# Patient Record
Sex: Female | Born: 1935
Health system: Southern US, Community
[De-identification: ages and names within clinical notes are randomized; demographics above are authoritative.]

## PROBLEM LIST (undated history)

## (undated) DIAGNOSIS — M1990 Unspecified osteoarthritis, unspecified site: Secondary | ICD-10-CM

## (undated) DIAGNOSIS — F32A Depression, unspecified: Secondary | ICD-10-CM

## (undated) DIAGNOSIS — T8859XA Other complications of anesthesia, initial encounter: Secondary | ICD-10-CM

## (undated) DIAGNOSIS — C3490 Malignant neoplasm of unspecified part of unspecified bronchus or lung: Secondary | ICD-10-CM

## (undated) DIAGNOSIS — F329 Major depressive disorder, single episode, unspecified: Secondary | ICD-10-CM

## (undated) DIAGNOSIS — T4145XA Adverse effect of unspecified anesthetic, initial encounter: Secondary | ICD-10-CM

## (undated) DIAGNOSIS — Z9109 Other allergy status, other than to drugs and biological substances: Secondary | ICD-10-CM

## (undated) DIAGNOSIS — R51 Headache: Secondary | ICD-10-CM

## (undated) DIAGNOSIS — C801 Malignant (primary) neoplasm, unspecified: Secondary | ICD-10-CM

## (undated) DIAGNOSIS — T783XXA Angioneurotic edema, initial encounter: Secondary | ICD-10-CM

## (undated) DIAGNOSIS — J309 Allergic rhinitis, unspecified: Secondary | ICD-10-CM

## (undated) DIAGNOSIS — J9819 Other pulmonary collapse: Secondary | ICD-10-CM

## (undated) DIAGNOSIS — J449 Chronic obstructive pulmonary disease, unspecified: Secondary | ICD-10-CM

## (undated) DIAGNOSIS — J4489 Other specified chronic obstructive pulmonary disease: Secondary | ICD-10-CM

## (undated) DIAGNOSIS — N390 Urinary tract infection, site not specified: Secondary | ICD-10-CM

## (undated) HISTORY — DX: Chronic obstructive pulmonary disease, unspecified: J44.9

## (undated) HISTORY — PX: ABDOMINAL HYSTERECTOMY: SHX81

## (undated) HISTORY — DX: Malignant neoplasm of unspecified part of unspecified bronchus or lung: C34.90

## (undated) HISTORY — DX: Angioneurotic edema, initial encounter: T78.3XXA

## (undated) HISTORY — DX: Other specified chronic obstructive pulmonary disease: J44.89

## (undated) HISTORY — DX: Allergic rhinitis, unspecified: J30.9

---

## 1957-11-04 DIAGNOSIS — J9819 Other pulmonary collapse: Secondary | ICD-10-CM

## 1957-11-04 HISTORY — DX: Other pulmonary collapse: J98.19

## 1967-11-05 HISTORY — PX: CHOLECYSTECTOMY: SHX55

## 1989-07-05 HISTORY — PX: CLOSED MANIPULATION SHOULDER: SUR205

## 1998-09-25 ENCOUNTER — Ambulatory Visit (HOSPITAL_COMMUNITY): Admission: RE | Admit: 1998-09-25 | Discharge: 1998-09-25 | Payer: Self-pay | Admitting: *Deleted

## 1999-08-28 ENCOUNTER — Encounter: Admission: RE | Admit: 1999-08-28 | Discharge: 1999-08-28 | Payer: Self-pay

## 1999-08-28 ENCOUNTER — Encounter: Payer: Self-pay | Admitting: *Deleted

## 2000-08-28 ENCOUNTER — Encounter: Admission: RE | Admit: 2000-08-28 | Discharge: 2000-08-28 | Payer: Self-pay | Admitting: Internal Medicine

## 2000-08-28 ENCOUNTER — Encounter: Payer: Self-pay | Admitting: Internal Medicine

## 2000-08-28 ENCOUNTER — Other Ambulatory Visit: Admission: RE | Admit: 2000-08-28 | Discharge: 2000-08-28 | Payer: Self-pay | Admitting: Internal Medicine

## 2000-09-04 ENCOUNTER — Encounter: Admission: RE | Admit: 2000-09-04 | Discharge: 2000-09-04 | Payer: Self-pay | Admitting: Internal Medicine

## 2000-09-04 ENCOUNTER — Encounter: Payer: Self-pay | Admitting: Internal Medicine

## 2000-09-08 ENCOUNTER — Encounter: Admission: RE | Admit: 2000-09-08 | Discharge: 2000-12-07 | Payer: Self-pay | Admitting: Internal Medicine

## 2001-01-14 ENCOUNTER — Encounter: Admission: RE | Admit: 2001-01-14 | Discharge: 2001-04-14 | Payer: Self-pay | Admitting: Internal Medicine

## 2001-06-16 ENCOUNTER — Encounter: Admission: RE | Admit: 2001-06-16 | Discharge: 2001-09-14 | Payer: Self-pay | Admitting: Internal Medicine

## 2001-08-31 ENCOUNTER — Encounter: Admission: RE | Admit: 2001-08-31 | Discharge: 2001-08-31 | Payer: Self-pay | Admitting: Internal Medicine

## 2001-08-31 ENCOUNTER — Encounter: Payer: Self-pay | Admitting: Internal Medicine

## 2002-04-20 ENCOUNTER — Encounter: Admission: RE | Admit: 2002-04-20 | Discharge: 2002-07-19 | Payer: Self-pay | Admitting: Internal Medicine

## 2002-09-08 ENCOUNTER — Encounter: Admission: RE | Admit: 2002-09-08 | Discharge: 2002-09-08 | Payer: Self-pay | Admitting: Internal Medicine

## 2002-09-08 ENCOUNTER — Encounter: Payer: Self-pay | Admitting: Internal Medicine

## 2003-10-13 ENCOUNTER — Encounter (INDEPENDENT_AMBULATORY_CARE_PROVIDER_SITE_OTHER): Payer: Self-pay | Admitting: *Deleted

## 2003-10-13 ENCOUNTER — Ambulatory Visit (HOSPITAL_COMMUNITY): Admission: RE | Admit: 2003-10-13 | Discharge: 2003-10-13 | Payer: Self-pay | Admitting: Gastroenterology

## 2003-10-18 ENCOUNTER — Encounter: Admission: RE | Admit: 2003-10-18 | Discharge: 2003-10-18 | Payer: Self-pay | Admitting: Internal Medicine

## 2004-09-17 ENCOUNTER — Other Ambulatory Visit: Admission: RE | Admit: 2004-09-17 | Discharge: 2004-09-17 | Payer: Self-pay | Admitting: Internal Medicine

## 2004-11-04 DIAGNOSIS — C801 Malignant (primary) neoplasm, unspecified: Secondary | ICD-10-CM

## 2004-11-04 HISTORY — DX: Malignant (primary) neoplasm, unspecified: C80.1

## 2004-11-04 HISTORY — PX: OTHER SURGICAL HISTORY: SHX169

## 2004-11-13 ENCOUNTER — Ambulatory Visit: Payer: Self-pay | Admitting: Internal Medicine

## 2004-11-15 ENCOUNTER — Encounter: Admission: RE | Admit: 2004-11-15 | Discharge: 2004-11-15 | Payer: Self-pay | Admitting: Internal Medicine

## 2004-11-21 ENCOUNTER — Ambulatory Visit: Payer: Self-pay | Admitting: Internal Medicine

## 2004-11-28 ENCOUNTER — Ambulatory Visit: Payer: Self-pay | Admitting: Internal Medicine

## 2004-12-03 ENCOUNTER — Ambulatory Visit: Payer: Self-pay | Admitting: Internal Medicine

## 2004-12-11 ENCOUNTER — Ambulatory Visit: Payer: Self-pay | Admitting: Internal Medicine

## 2004-12-18 ENCOUNTER — Ambulatory Visit: Payer: Self-pay | Admitting: Internal Medicine

## 2004-12-24 ENCOUNTER — Ambulatory Visit: Payer: Self-pay | Admitting: Internal Medicine

## 2004-12-31 ENCOUNTER — Ambulatory Visit: Payer: Self-pay | Admitting: Internal Medicine

## 2005-01-08 ENCOUNTER — Ambulatory Visit: Payer: Self-pay | Admitting: Internal Medicine

## 2005-01-15 ENCOUNTER — Ambulatory Visit: Payer: Self-pay | Admitting: Internal Medicine

## 2005-01-21 ENCOUNTER — Ambulatory Visit: Payer: Self-pay | Admitting: Internal Medicine

## 2005-01-28 ENCOUNTER — Ambulatory Visit: Payer: Self-pay | Admitting: Internal Medicine

## 2005-02-04 ENCOUNTER — Ambulatory Visit: Payer: Self-pay | Admitting: Internal Medicine

## 2005-02-11 ENCOUNTER — Ambulatory Visit: Payer: Self-pay | Admitting: Internal Medicine

## 2005-02-19 ENCOUNTER — Ambulatory Visit: Payer: Self-pay | Admitting: Internal Medicine

## 2005-02-25 ENCOUNTER — Ambulatory Visit: Payer: Self-pay | Admitting: Internal Medicine

## 2005-03-04 ENCOUNTER — Ambulatory Visit: Payer: Self-pay | Admitting: Internal Medicine

## 2005-03-11 ENCOUNTER — Ambulatory Visit: Payer: Self-pay | Admitting: Internal Medicine

## 2005-03-19 ENCOUNTER — Ambulatory Visit: Payer: Self-pay | Admitting: Internal Medicine

## 2005-03-28 ENCOUNTER — Ambulatory Visit: Payer: Self-pay | Admitting: Internal Medicine

## 2005-04-02 ENCOUNTER — Ambulatory Visit: Payer: Self-pay | Admitting: *Deleted

## 2005-04-05 ENCOUNTER — Ambulatory Visit: Payer: Self-pay | Admitting: Internal Medicine

## 2005-04-09 ENCOUNTER — Ambulatory Visit (HOSPITAL_COMMUNITY): Admission: RE | Admit: 2005-04-09 | Discharge: 2005-04-09 | Payer: Self-pay | Admitting: Internal Medicine

## 2005-04-09 ENCOUNTER — Encounter (INDEPENDENT_AMBULATORY_CARE_PROVIDER_SITE_OTHER): Payer: Self-pay | Admitting: Specialist

## 2005-04-11 ENCOUNTER — Ambulatory Visit: Payer: Self-pay | Admitting: Internal Medicine

## 2005-04-17 ENCOUNTER — Ambulatory Visit (HOSPITAL_COMMUNITY): Admission: RE | Admit: 2005-04-17 | Discharge: 2005-04-17 | Payer: Self-pay | Admitting: Internal Medicine

## 2005-04-18 ENCOUNTER — Ambulatory Visit (HOSPITAL_COMMUNITY): Admission: RE | Admit: 2005-04-18 | Discharge: 2005-04-18 | Payer: Self-pay | Admitting: Internal Medicine

## 2005-04-22 ENCOUNTER — Ambulatory Visit: Payer: Self-pay | Admitting: Internal Medicine

## 2005-04-24 ENCOUNTER — Encounter: Admission: RE | Admit: 2005-04-24 | Discharge: 2005-04-24 | Payer: Self-pay | Admitting: Thoracic Surgery

## 2005-04-29 ENCOUNTER — Ambulatory Visit: Payer: Self-pay | Admitting: Internal Medicine

## 2005-05-06 ENCOUNTER — Ambulatory Visit: Payer: Self-pay | Admitting: Internal Medicine

## 2005-05-09 ENCOUNTER — Inpatient Hospital Stay (HOSPITAL_COMMUNITY): Admission: RE | Admit: 2005-05-09 | Discharge: 2005-05-21 | Payer: Self-pay | Admitting: Thoracic Surgery

## 2005-05-09 ENCOUNTER — Encounter (INDEPENDENT_AMBULATORY_CARE_PROVIDER_SITE_OTHER): Payer: Self-pay | Admitting: *Deleted

## 2005-05-14 ENCOUNTER — Ambulatory Visit: Payer: Self-pay | Admitting: Internal Medicine

## 2005-05-21 ENCOUNTER — Inpatient Hospital Stay: Admission: RE | Admit: 2005-05-21 | Discharge: 2005-05-28 | Payer: Self-pay | Admitting: Thoracic Surgery

## 2005-06-04 ENCOUNTER — Ambulatory Visit: Payer: Self-pay | Admitting: Internal Medicine

## 2005-06-05 ENCOUNTER — Encounter: Admission: RE | Admit: 2005-06-05 | Discharge: 2005-06-05 | Payer: Self-pay | Admitting: Thoracic Surgery

## 2005-06-12 ENCOUNTER — Encounter: Admission: RE | Admit: 2005-06-12 | Discharge: 2005-06-12 | Payer: Self-pay | Admitting: Thoracic Surgery

## 2005-07-03 ENCOUNTER — Encounter: Admission: RE | Admit: 2005-07-03 | Discharge: 2005-07-03 | Payer: Self-pay | Admitting: Thoracic Surgery

## 2005-07-22 ENCOUNTER — Ambulatory Visit: Payer: Self-pay | Admitting: Internal Medicine

## 2005-08-14 ENCOUNTER — Encounter: Admission: RE | Admit: 2005-08-14 | Discharge: 2005-08-14 | Payer: Self-pay | Admitting: Thoracic Surgery

## 2005-09-09 ENCOUNTER — Ambulatory Visit: Payer: Self-pay | Admitting: Internal Medicine

## 2005-10-09 ENCOUNTER — Ambulatory Visit (HOSPITAL_COMMUNITY): Admission: RE | Admit: 2005-10-09 | Discharge: 2005-10-09 | Payer: Self-pay | Admitting: Internal Medicine

## 2005-11-20 ENCOUNTER — Encounter: Admission: RE | Admit: 2005-11-20 | Discharge: 2005-11-20 | Payer: Self-pay | Admitting: Thoracic Surgery

## 2006-01-03 ENCOUNTER — Ambulatory Visit: Payer: Self-pay | Admitting: Internal Medicine

## 2006-01-07 ENCOUNTER — Ambulatory Visit (HOSPITAL_COMMUNITY): Admission: RE | Admit: 2006-01-07 | Discharge: 2006-01-07 | Payer: Self-pay | Admitting: Internal Medicine

## 2006-01-24 ENCOUNTER — Ambulatory Visit: Payer: Self-pay | Admitting: Internal Medicine

## 2006-03-03 ENCOUNTER — Ambulatory Visit: Payer: Self-pay | Admitting: Internal Medicine

## 2006-03-26 ENCOUNTER — Encounter: Admission: RE | Admit: 2006-03-26 | Discharge: 2006-03-26 | Payer: Self-pay | Admitting: Thoracic Surgery

## 2006-04-30 ENCOUNTER — Ambulatory Visit: Payer: Self-pay | Admitting: Internal Medicine

## 2006-05-06 LAB — CBC WITH DIFFERENTIAL/PLATELET
BASO%: 0.6 % (ref 0.0–2.0)
Basophils Absolute: 0 10*3/uL (ref 0.0–0.1)
EOS%: 1.9 % (ref 0.0–7.0)
HGB: 12.5 g/dL (ref 11.6–15.9)
MCH: 31.1 pg (ref 26.0–34.0)
MCHC: 33.9 g/dL (ref 32.0–36.0)
MCV: 92 fL (ref 81.0–101.0)
MONO%: 6 % (ref 0.0–13.0)
NEUT%: 53.4 % (ref 39.6–76.8)
RDW: 13.4 % (ref 11.3–14.5)
lymph#: 2.4 10*3/uL (ref 0.9–3.3)

## 2006-05-06 LAB — COMPREHENSIVE METABOLIC PANEL
ALT: 15 U/L (ref 0–40)
AST: 18 U/L (ref 0–37)
Alkaline Phosphatase: 76 U/L (ref 39–117)
BUN: 29 mg/dL — ABNORMAL HIGH (ref 6–23)
Creatinine, Ser: 0.99 mg/dL (ref 0.40–1.20)
Potassium: 4.8 mEq/L (ref 3.5–5.3)

## 2006-05-08 ENCOUNTER — Ambulatory Visit (HOSPITAL_COMMUNITY): Admission: RE | Admit: 2006-05-08 | Discharge: 2006-05-08 | Payer: Self-pay | Admitting: Internal Medicine

## 2006-08-29 ENCOUNTER — Ambulatory Visit: Payer: Self-pay | Admitting: Internal Medicine

## 2006-09-02 LAB — CBC WITH DIFFERENTIAL/PLATELET
BASO%: 0.2 % (ref 0.0–2.0)
EOS%: 2.3 % (ref 0.0–7.0)
HGB: 11.9 g/dL (ref 11.6–15.9)
MCH: 31.4 pg (ref 26.0–34.0)
MCHC: 34.2 g/dL (ref 32.0–36.0)
MCV: 91.7 fL (ref 81.0–101.0)
MONO%: 5 % (ref 0.0–13.0)
RBC: 3.8 10*6/uL (ref 3.70–5.32)
RDW: 13.5 % (ref 11.3–14.5)
WBC: 7 10*3/uL (ref 3.9–10.0)
lymph#: 2.7 10*3/uL (ref 0.9–3.3)

## 2006-09-02 LAB — COMPREHENSIVE METABOLIC PANEL
ALT: 13 U/L (ref 0–40)
AST: 17 U/L (ref 0–37)
Albumin: 4.3 g/dL (ref 3.5–5.2)
Alkaline Phosphatase: 58 U/L (ref 39–117)
BUN: 22 mg/dL (ref 6–23)
Calcium: 8.8 mg/dL (ref 8.4–10.5)
Chloride: 106 mEq/L (ref 96–112)
Potassium: 4.6 mEq/L (ref 3.5–5.3)
Sodium: 140 mEq/L (ref 135–145)

## 2006-09-05 ENCOUNTER — Ambulatory Visit (HOSPITAL_COMMUNITY): Admission: RE | Admit: 2006-09-05 | Discharge: 2006-09-05 | Payer: Self-pay | Admitting: Internal Medicine

## 2006-09-17 ENCOUNTER — Encounter: Admission: RE | Admit: 2006-09-17 | Discharge: 2006-09-17 | Payer: Self-pay | Admitting: Internal Medicine

## 2006-10-08 ENCOUNTER — Encounter: Admission: RE | Admit: 2006-10-08 | Discharge: 2006-10-08 | Payer: Self-pay | Admitting: Family Medicine

## 2007-01-01 ENCOUNTER — Ambulatory Visit: Payer: Self-pay | Admitting: Internal Medicine

## 2007-01-06 LAB — COMPREHENSIVE METABOLIC PANEL
AST: 15 U/L (ref 0–37)
Alkaline Phosphatase: 67 U/L (ref 39–117)
BUN: 23 mg/dL (ref 6–23)
Calcium: 9.2 mg/dL (ref 8.4–10.5)
Chloride: 104 mEq/L (ref 96–112)
Creatinine, Ser: 0.93 mg/dL (ref 0.40–1.20)
Total Bilirubin: 0.4 mg/dL (ref 0.3–1.2)

## 2007-01-06 LAB — CBC WITH DIFFERENTIAL/PLATELET
Basophils Absolute: 0 10*3/uL (ref 0.0–0.1)
EOS%: 2.7 % (ref 0.0–7.0)
HCT: 36.3 % (ref 34.8–46.6)
HGB: 12.6 g/dL (ref 11.6–15.9)
LYMPH%: 36 % (ref 14.0–48.0)
MCH: 31.6 pg (ref 26.0–34.0)
MCHC: 34.6 g/dL (ref 32.0–36.0)
MCV: 91.3 fL (ref 81.0–101.0)
MONO%: 4.9 % (ref 0.0–13.0)
NEUT%: 56.1 % (ref 39.6–76.8)

## 2007-01-08 ENCOUNTER — Ambulatory Visit (HOSPITAL_COMMUNITY): Admission: RE | Admit: 2007-01-08 | Discharge: 2007-01-08 | Payer: Self-pay | Admitting: Internal Medicine

## 2007-01-14 ENCOUNTER — Ambulatory Visit: Payer: Self-pay | Admitting: Thoracic Surgery

## 2007-06-15 ENCOUNTER — Ambulatory Visit: Payer: Self-pay | Admitting: Internal Medicine

## 2007-07-02 ENCOUNTER — Ambulatory Visit: Payer: Self-pay | Admitting: Internal Medicine

## 2007-07-07 LAB — CBC WITH DIFFERENTIAL/PLATELET
BASO%: 1 % (ref 0.0–2.0)
EOS%: 4.9 % (ref 0.0–7.0)
MCH: 32.2 pg (ref 26.0–34.0)
MCHC: 34.8 g/dL (ref 32.0–36.0)
MONO#: 0.5 10*3/uL (ref 0.1–0.9)
RBC: 3.95 10*6/uL (ref 3.70–5.32)
RDW: 13 % (ref 11.3–14.5)
WBC: 6.7 10*3/uL (ref 3.9–10.0)
lymph#: 2.7 10*3/uL (ref 0.9–3.3)

## 2007-07-07 LAB — COMPREHENSIVE METABOLIC PANEL
ALT: 10 U/L (ref 0–35)
AST: 14 U/L (ref 0–37)
Albumin: 4.5 g/dL (ref 3.5–5.2)
CO2: 25 mEq/L (ref 19–32)
Calcium: 9.2 mg/dL (ref 8.4–10.5)
Chloride: 104 mEq/L (ref 96–112)
Creatinine, Ser: 0.87 mg/dL (ref 0.40–1.20)
Potassium: 4.5 mEq/L (ref 3.5–5.3)
Sodium: 141 mEq/L (ref 135–145)
Total Protein: 7.1 g/dL (ref 6.0–8.3)

## 2007-07-08 ENCOUNTER — Ambulatory Visit (HOSPITAL_COMMUNITY): Admission: RE | Admit: 2007-07-08 | Discharge: 2007-07-08 | Payer: Self-pay | Admitting: Internal Medicine

## 2007-08-19 DIAGNOSIS — T783XXA Angioneurotic edema, initial encounter: Secondary | ICD-10-CM | POA: Insufficient documentation

## 2007-08-19 DIAGNOSIS — J309 Allergic rhinitis, unspecified: Secondary | ICD-10-CM | POA: Insufficient documentation

## 2007-08-19 DIAGNOSIS — J449 Chronic obstructive pulmonary disease, unspecified: Secondary | ICD-10-CM | POA: Insufficient documentation

## 2007-08-19 DIAGNOSIS — C3411 Malignant neoplasm of upper lobe, right bronchus or lung: Secondary | ICD-10-CM | POA: Insufficient documentation

## 2007-08-20 ENCOUNTER — Ambulatory Visit: Payer: Self-pay | Admitting: Internal Medicine

## 2007-09-09 ENCOUNTER — Ambulatory Visit: Payer: Self-pay | Admitting: Internal Medicine

## 2007-09-14 ENCOUNTER — Ambulatory Visit: Payer: Self-pay | Admitting: Internal Medicine

## 2007-09-15 ENCOUNTER — Ambulatory Visit: Payer: Self-pay | Admitting: Internal Medicine

## 2007-09-18 ENCOUNTER — Ambulatory Visit: Payer: Self-pay | Admitting: Internal Medicine

## 2007-09-21 ENCOUNTER — Encounter: Admission: RE | Admit: 2007-09-21 | Discharge: 2007-09-21 | Payer: Self-pay | Admitting: Internal Medicine

## 2007-09-21 ENCOUNTER — Ambulatory Visit: Payer: Self-pay | Admitting: Internal Medicine

## 2007-09-24 ENCOUNTER — Ambulatory Visit: Payer: Self-pay | Admitting: Internal Medicine

## 2007-09-28 ENCOUNTER — Ambulatory Visit: Payer: Self-pay | Admitting: Internal Medicine

## 2007-10-02 ENCOUNTER — Ambulatory Visit: Payer: Self-pay | Admitting: Internal Medicine

## 2007-10-05 ENCOUNTER — Ambulatory Visit: Payer: Self-pay | Admitting: Internal Medicine

## 2007-10-08 ENCOUNTER — Ambulatory Visit: Payer: Self-pay | Admitting: Internal Medicine

## 2007-10-12 ENCOUNTER — Ambulatory Visit: Payer: Self-pay | Admitting: Internal Medicine

## 2007-10-15 ENCOUNTER — Ambulatory Visit: Payer: Self-pay | Admitting: Internal Medicine

## 2007-10-20 ENCOUNTER — Ambulatory Visit: Payer: Self-pay | Admitting: Internal Medicine

## 2007-10-23 ENCOUNTER — Ambulatory Visit: Payer: Self-pay | Admitting: Internal Medicine

## 2007-10-26 ENCOUNTER — Ambulatory Visit: Payer: Self-pay | Admitting: Internal Medicine

## 2007-10-30 ENCOUNTER — Ambulatory Visit: Payer: Self-pay | Admitting: Internal Medicine

## 2007-11-02 ENCOUNTER — Ambulatory Visit: Payer: Self-pay | Admitting: Internal Medicine

## 2007-11-06 ENCOUNTER — Ambulatory Visit: Payer: Self-pay | Admitting: Internal Medicine

## 2007-11-09 ENCOUNTER — Ambulatory Visit: Payer: Self-pay | Admitting: Internal Medicine

## 2007-11-12 ENCOUNTER — Ambulatory Visit: Payer: Self-pay | Admitting: Internal Medicine

## 2007-11-16 ENCOUNTER — Ambulatory Visit: Payer: Self-pay | Admitting: Internal Medicine

## 2007-11-23 ENCOUNTER — Ambulatory Visit: Payer: Self-pay | Admitting: Internal Medicine

## 2007-11-26 ENCOUNTER — Ambulatory Visit: Payer: Self-pay | Admitting: Internal Medicine

## 2007-12-04 ENCOUNTER — Ambulatory Visit: Payer: Self-pay | Admitting: Internal Medicine

## 2007-12-11 ENCOUNTER — Ambulatory Visit: Payer: Self-pay | Admitting: Internal Medicine

## 2007-12-14 ENCOUNTER — Ambulatory Visit: Payer: Self-pay | Admitting: Internal Medicine

## 2007-12-15 ENCOUNTER — Ambulatory Visit: Payer: Self-pay | Admitting: Internal Medicine

## 2007-12-17 ENCOUNTER — Ambulatory Visit: Payer: Self-pay | Admitting: Internal Medicine

## 2007-12-21 ENCOUNTER — Ambulatory Visit: Payer: Self-pay | Admitting: Internal Medicine

## 2007-12-25 ENCOUNTER — Ambulatory Visit: Payer: Self-pay | Admitting: Internal Medicine

## 2007-12-28 ENCOUNTER — Ambulatory Visit: Payer: Self-pay | Admitting: Internal Medicine

## 2007-12-31 ENCOUNTER — Ambulatory Visit: Payer: Self-pay | Admitting: Internal Medicine

## 2008-01-04 ENCOUNTER — Ambulatory Visit: Payer: Self-pay | Admitting: Internal Medicine

## 2008-01-06 ENCOUNTER — Ambulatory Visit: Payer: Self-pay | Admitting: Internal Medicine

## 2008-01-06 ENCOUNTER — Ambulatory Visit (HOSPITAL_COMMUNITY): Admission: RE | Admit: 2008-01-06 | Discharge: 2008-01-06 | Payer: Self-pay | Admitting: Internal Medicine

## 2008-01-06 LAB — COMPREHENSIVE METABOLIC PANEL
Albumin: 4 g/dL (ref 3.5–5.2)
BUN: 14 mg/dL (ref 6–23)
CO2: 30 mEq/L (ref 19–32)
Calcium: 9.8 mg/dL (ref 8.4–10.5)
Chloride: 102 mEq/L (ref 96–112)
Creatinine, Ser: 0.69 mg/dL (ref 0.40–1.20)
Glucose, Bld: 83 mg/dL (ref 70–99)
Potassium: 4.3 mEq/L (ref 3.5–5.3)

## 2008-01-06 LAB — CBC WITH DIFFERENTIAL/PLATELET
Basophils Absolute: 0 10*3/uL (ref 0.0–0.1)
Eosinophils Absolute: 0.4 10*3/uL (ref 0.0–0.5)
HCT: 36.3 % (ref 34.8–46.6)
HGB: 12.5 g/dL (ref 11.6–15.9)
MCH: 31.4 pg (ref 26.0–34.0)
MCV: 91.4 fL (ref 81.0–101.0)
NEUT#: 5.6 10*3/uL (ref 1.5–6.5)
NEUT%: 63.5 % (ref 39.6–76.8)
RDW: 13.5 % (ref 11.3–14.5)
lymph#: 2.3 10*3/uL (ref 0.9–3.3)

## 2008-01-13 ENCOUNTER — Ambulatory Visit: Payer: Self-pay | Admitting: Internal Medicine

## 2008-01-18 ENCOUNTER — Ambulatory Visit: Payer: Self-pay | Admitting: Internal Medicine

## 2008-01-25 ENCOUNTER — Ambulatory Visit: Payer: Self-pay | Admitting: Internal Medicine

## 2008-02-01 ENCOUNTER — Ambulatory Visit: Payer: Self-pay | Admitting: Internal Medicine

## 2008-02-09 ENCOUNTER — Ambulatory Visit: Payer: Self-pay | Admitting: Internal Medicine

## 2008-02-16 ENCOUNTER — Ambulatory Visit: Payer: Self-pay | Admitting: Internal Medicine

## 2008-02-29 ENCOUNTER — Ambulatory Visit: Payer: Self-pay | Admitting: Internal Medicine

## 2008-03-08 ENCOUNTER — Ambulatory Visit: Payer: Self-pay | Admitting: Internal Medicine

## 2008-03-12 ENCOUNTER — Encounter: Payer: Self-pay | Admitting: Internal Medicine

## 2008-03-14 ENCOUNTER — Ambulatory Visit: Payer: Self-pay | Admitting: Internal Medicine

## 2008-03-23 ENCOUNTER — Ambulatory Visit: Payer: Self-pay | Admitting: Internal Medicine

## 2008-03-29 ENCOUNTER — Ambulatory Visit: Payer: Self-pay | Admitting: Internal Medicine

## 2008-03-31 ENCOUNTER — Ambulatory Visit (HOSPITAL_COMMUNITY): Admission: RE | Admit: 2008-03-31 | Discharge: 2008-03-31 | Payer: Self-pay | Admitting: Internal Medicine

## 2008-04-01 ENCOUNTER — Ambulatory Visit: Payer: Self-pay | Admitting: Internal Medicine

## 2008-04-06 ENCOUNTER — Encounter: Payer: Self-pay | Admitting: Internal Medicine

## 2008-04-11 ENCOUNTER — Ambulatory Visit: Payer: Self-pay | Admitting: Internal Medicine

## 2008-04-18 ENCOUNTER — Ambulatory Visit: Payer: Self-pay | Admitting: Internal Medicine

## 2008-04-26 ENCOUNTER — Ambulatory Visit: Payer: Self-pay | Admitting: Internal Medicine

## 2008-05-03 ENCOUNTER — Ambulatory Visit: Payer: Self-pay | Admitting: Internal Medicine

## 2008-05-10 ENCOUNTER — Ambulatory Visit: Payer: Self-pay | Admitting: Internal Medicine

## 2008-05-16 ENCOUNTER — Ambulatory Visit: Payer: Self-pay | Admitting: Internal Medicine

## 2008-05-23 ENCOUNTER — Ambulatory Visit: Payer: Self-pay | Admitting: Internal Medicine

## 2008-05-31 ENCOUNTER — Ambulatory Visit: Payer: Self-pay | Admitting: Internal Medicine

## 2008-06-06 ENCOUNTER — Ambulatory Visit: Payer: Self-pay | Admitting: Internal Medicine

## 2008-06-13 ENCOUNTER — Ambulatory Visit: Payer: Self-pay | Admitting: Internal Medicine

## 2008-06-20 ENCOUNTER — Ambulatory Visit: Payer: Self-pay | Admitting: Internal Medicine

## 2008-06-27 ENCOUNTER — Ambulatory Visit: Payer: Self-pay | Admitting: Internal Medicine

## 2008-07-04 ENCOUNTER — Ambulatory Visit: Payer: Self-pay | Admitting: Internal Medicine

## 2008-07-12 ENCOUNTER — Ambulatory Visit: Payer: Self-pay | Admitting: Internal Medicine

## 2008-07-18 ENCOUNTER — Ambulatory Visit: Payer: Self-pay | Admitting: Internal Medicine

## 2008-07-25 ENCOUNTER — Ambulatory Visit: Payer: Self-pay | Admitting: Internal Medicine

## 2008-08-03 ENCOUNTER — Ambulatory Visit: Payer: Self-pay | Admitting: Internal Medicine

## 2008-08-11 ENCOUNTER — Ambulatory Visit: Payer: Self-pay | Admitting: Internal Medicine

## 2008-08-12 ENCOUNTER — Ambulatory Visit: Payer: Self-pay | Admitting: Internal Medicine

## 2008-08-18 ENCOUNTER — Ambulatory Visit: Payer: Self-pay | Admitting: Internal Medicine

## 2008-08-22 ENCOUNTER — Ambulatory Visit: Payer: Self-pay | Admitting: Internal Medicine

## 2008-08-29 ENCOUNTER — Ambulatory Visit: Payer: Self-pay | Admitting: Internal Medicine

## 2008-09-05 ENCOUNTER — Ambulatory Visit: Payer: Self-pay | Admitting: Internal Medicine

## 2008-09-08 ENCOUNTER — Ambulatory Visit: Payer: Self-pay | Admitting: Internal Medicine

## 2008-09-14 ENCOUNTER — Ambulatory Visit: Payer: Self-pay | Admitting: Internal Medicine

## 2008-09-22 ENCOUNTER — Ambulatory Visit: Payer: Self-pay | Admitting: Internal Medicine

## 2008-09-26 ENCOUNTER — Ambulatory Visit: Payer: Self-pay | Admitting: Internal Medicine

## 2008-09-28 ENCOUNTER — Ambulatory Visit: Payer: Self-pay | Admitting: Internal Medicine

## 2008-10-03 ENCOUNTER — Ambulatory Visit (HOSPITAL_COMMUNITY): Admission: RE | Admit: 2008-10-03 | Discharge: 2008-10-03 | Payer: Self-pay | Admitting: Internal Medicine

## 2008-10-03 LAB — COMPREHENSIVE METABOLIC PANEL
AST: 19 U/L (ref 0–37)
Albumin: 4.1 g/dL (ref 3.5–5.2)
Alkaline Phosphatase: 57 U/L (ref 39–117)
BUN: 26 mg/dL — ABNORMAL HIGH (ref 6–23)
Calcium: 9.7 mg/dL (ref 8.4–10.5)
Creatinine, Ser: 0.95 mg/dL (ref 0.40–1.20)
Glucose, Bld: 84 mg/dL (ref 70–99)
Potassium: 4.4 mEq/L (ref 3.5–5.3)

## 2008-10-03 LAB — CBC WITH DIFFERENTIAL/PLATELET
Basophils Absolute: 0 10*3/uL (ref 0.0–0.1)
EOS%: 3.1 % (ref 0.0–7.0)
Eosinophils Absolute: 0.3 10*3/uL (ref 0.0–0.5)
HCT: 35.8 % (ref 34.8–46.6)
HGB: 12.3 g/dL (ref 11.6–15.9)
MCH: 32.3 pg (ref 26.0–34.0)
MCV: 93.9 fL (ref 81.0–101.0)
MONO%: 5 % (ref 0.0–13.0)
NEUT#: 4.8 10*3/uL (ref 1.5–6.5)
NEUT%: 60 % (ref 39.6–76.8)
Platelets: 292 10*3/uL (ref 145–400)

## 2008-10-06 ENCOUNTER — Ambulatory Visit: Payer: Self-pay | Admitting: Internal Medicine

## 2008-10-10 ENCOUNTER — Ambulatory Visit: Payer: Self-pay | Admitting: Internal Medicine

## 2008-10-17 ENCOUNTER — Ambulatory Visit: Payer: Self-pay | Admitting: Internal Medicine

## 2008-10-25 ENCOUNTER — Ambulatory Visit: Payer: Self-pay | Admitting: Internal Medicine

## 2008-10-25 ENCOUNTER — Ambulatory Visit: Payer: Self-pay | Admitting: Pulmonary Disease

## 2008-11-01 ENCOUNTER — Ambulatory Visit: Payer: Self-pay | Admitting: Internal Medicine

## 2008-11-07 ENCOUNTER — Ambulatory Visit: Payer: Self-pay | Admitting: Internal Medicine

## 2008-11-12 ENCOUNTER — Ambulatory Visit: Payer: Self-pay | Admitting: Internal Medicine

## 2008-11-14 ENCOUNTER — Ambulatory Visit: Payer: Self-pay | Admitting: Internal Medicine

## 2008-11-28 ENCOUNTER — Ambulatory Visit: Payer: Self-pay | Admitting: Internal Medicine

## 2008-12-07 ENCOUNTER — Ambulatory Visit: Payer: Self-pay | Admitting: Internal Medicine

## 2008-12-08 ENCOUNTER — Ambulatory Visit: Payer: Self-pay | Admitting: Internal Medicine

## 2008-12-12 ENCOUNTER — Ambulatory Visit: Payer: Self-pay | Admitting: Internal Medicine

## 2008-12-15 ENCOUNTER — Encounter: Admission: RE | Admit: 2008-12-15 | Discharge: 2008-12-15 | Payer: Self-pay | Admitting: Gastroenterology

## 2008-12-19 ENCOUNTER — Ambulatory Visit: Payer: Self-pay | Admitting: Internal Medicine

## 2008-12-26 ENCOUNTER — Ambulatory Visit: Payer: Self-pay | Admitting: Internal Medicine

## 2009-01-02 ENCOUNTER — Ambulatory Visit: Payer: Self-pay | Admitting: Internal Medicine

## 2009-01-10 ENCOUNTER — Ambulatory Visit: Payer: Self-pay | Admitting: Internal Medicine

## 2009-01-18 ENCOUNTER — Ambulatory Visit: Payer: Self-pay | Admitting: Internal Medicine

## 2009-01-24 ENCOUNTER — Ambulatory Visit: Payer: Self-pay | Admitting: Internal Medicine

## 2009-02-01 ENCOUNTER — Ambulatory Visit: Payer: Self-pay | Admitting: Internal Medicine

## 2009-02-06 ENCOUNTER — Ambulatory Visit: Payer: Self-pay | Admitting: Internal Medicine

## 2009-02-13 ENCOUNTER — Ambulatory Visit: Payer: Self-pay | Admitting: Internal Medicine

## 2009-02-23 ENCOUNTER — Ambulatory Visit: Payer: Self-pay | Admitting: Internal Medicine

## 2009-03-01 ENCOUNTER — Ambulatory Visit: Payer: Self-pay | Admitting: Internal Medicine

## 2009-03-06 ENCOUNTER — Ambulatory Visit: Payer: Self-pay | Admitting: Internal Medicine

## 2009-03-14 ENCOUNTER — Ambulatory Visit: Payer: Self-pay | Admitting: Internal Medicine

## 2009-03-20 ENCOUNTER — Ambulatory Visit: Payer: Self-pay | Admitting: Internal Medicine

## 2009-03-30 ENCOUNTER — Ambulatory Visit: Payer: Self-pay | Admitting: Internal Medicine

## 2009-03-31 ENCOUNTER — Encounter: Admission: RE | Admit: 2009-03-31 | Discharge: 2009-03-31 | Payer: Self-pay | Admitting: Internal Medicine

## 2009-04-04 ENCOUNTER — Ambulatory Visit (HOSPITAL_COMMUNITY): Admission: RE | Admit: 2009-04-04 | Discharge: 2009-04-04 | Payer: Self-pay | Admitting: Internal Medicine

## 2009-04-04 ENCOUNTER — Ambulatory Visit: Payer: Self-pay | Admitting: Internal Medicine

## 2009-04-04 LAB — COMPREHENSIVE METABOLIC PANEL
AST: 20 U/L (ref 0–37)
Alkaline Phosphatase: 71 U/L (ref 39–117)
BUN: 26 mg/dL — ABNORMAL HIGH (ref 6–23)
Creatinine, Ser: 1.6 mg/dL — ABNORMAL HIGH (ref 0.40–1.20)
Glucose, Bld: 93 mg/dL (ref 70–99)
Potassium: 4.7 mEq/L (ref 3.5–5.3)
Total Bilirubin: 0.8 mg/dL (ref 0.3–1.2)

## 2009-04-04 LAB — CBC WITH DIFFERENTIAL/PLATELET
BASO%: 0.3 % (ref 0.0–2.0)
Basophils Absolute: 0 10*3/uL (ref 0.0–0.1)
HCT: 37.1 % (ref 34.8–46.6)
HGB: 12.7 g/dL (ref 11.6–15.9)
MONO#: 0.4 10*3/uL (ref 0.1–0.9)
NEUT%: 58.5 % (ref 38.4–76.8)
RDW: 12.8 % (ref 11.2–14.5)
WBC: 7 10*3/uL (ref 3.9–10.3)
lymph#: 2.2 10*3/uL (ref 0.9–3.3)

## 2009-04-06 ENCOUNTER — Encounter: Payer: Self-pay | Admitting: Internal Medicine

## 2009-04-11 ENCOUNTER — Ambulatory Visit: Payer: Self-pay | Admitting: Internal Medicine

## 2009-04-12 ENCOUNTER — Ambulatory Visit: Payer: Self-pay | Admitting: Internal Medicine

## 2009-04-17 ENCOUNTER — Ambulatory Visit: Payer: Self-pay | Admitting: Internal Medicine

## 2009-04-24 ENCOUNTER — Ambulatory Visit: Payer: Self-pay | Admitting: Internal Medicine

## 2009-05-01 ENCOUNTER — Ambulatory Visit: Payer: Self-pay | Admitting: Internal Medicine

## 2009-05-10 ENCOUNTER — Ambulatory Visit: Payer: Self-pay | Admitting: Internal Medicine

## 2009-05-15 ENCOUNTER — Ambulatory Visit: Payer: Self-pay | Admitting: Internal Medicine

## 2009-05-24 ENCOUNTER — Ambulatory Visit: Payer: Self-pay | Admitting: Internal Medicine

## 2009-05-31 ENCOUNTER — Ambulatory Visit: Payer: Self-pay | Admitting: Internal Medicine

## 2009-06-08 ENCOUNTER — Ambulatory Visit: Payer: Self-pay | Admitting: Internal Medicine

## 2009-06-14 ENCOUNTER — Ambulatory Visit: Payer: Self-pay | Admitting: Internal Medicine

## 2009-06-20 ENCOUNTER — Ambulatory Visit: Payer: Self-pay | Admitting: Internal Medicine

## 2009-06-26 ENCOUNTER — Ambulatory Visit: Payer: Self-pay | Admitting: Internal Medicine

## 2009-07-03 ENCOUNTER — Ambulatory Visit: Payer: Self-pay | Admitting: Internal Medicine

## 2009-07-11 ENCOUNTER — Ambulatory Visit: Payer: Self-pay | Admitting: Internal Medicine

## 2009-07-17 ENCOUNTER — Ambulatory Visit: Payer: Self-pay | Admitting: Internal Medicine

## 2009-07-26 ENCOUNTER — Ambulatory Visit: Payer: Self-pay | Admitting: Internal Medicine

## 2009-08-02 ENCOUNTER — Ambulatory Visit: Payer: Self-pay | Admitting: Internal Medicine

## 2009-08-09 ENCOUNTER — Ambulatory Visit: Payer: Self-pay | Admitting: Internal Medicine

## 2009-08-15 ENCOUNTER — Ambulatory Visit: Payer: Self-pay | Admitting: Internal Medicine

## 2009-08-16 ENCOUNTER — Ambulatory Visit: Payer: Self-pay | Admitting: Internal Medicine

## 2009-08-21 ENCOUNTER — Ambulatory Visit: Payer: Self-pay | Admitting: Internal Medicine

## 2009-08-28 ENCOUNTER — Ambulatory Visit: Payer: Self-pay | Admitting: Internal Medicine

## 2009-09-05 ENCOUNTER — Ambulatory Visit: Payer: Self-pay | Admitting: Internal Medicine

## 2009-09-11 ENCOUNTER — Ambulatory Visit: Payer: Self-pay | Admitting: Internal Medicine

## 2009-09-18 ENCOUNTER — Ambulatory Visit: Payer: Self-pay | Admitting: Internal Medicine

## 2009-09-25 ENCOUNTER — Ambulatory Visit: Payer: Self-pay | Admitting: Internal Medicine

## 2009-10-04 ENCOUNTER — Ambulatory Visit: Payer: Self-pay | Admitting: Internal Medicine

## 2009-10-09 ENCOUNTER — Ambulatory Visit: Payer: Self-pay | Admitting: Internal Medicine

## 2009-10-16 ENCOUNTER — Ambulatory Visit: Payer: Self-pay | Admitting: Internal Medicine

## 2009-10-18 ENCOUNTER — Ambulatory Visit (HOSPITAL_COMMUNITY): Admission: RE | Admit: 2009-10-18 | Discharge: 2009-10-18 | Payer: Self-pay | Admitting: Internal Medicine

## 2009-10-18 ENCOUNTER — Ambulatory Visit: Payer: Self-pay | Admitting: Internal Medicine

## 2009-10-18 LAB — COMPREHENSIVE METABOLIC PANEL
Albumin: 4.4 g/dL (ref 3.5–5.2)
BUN: 29 mg/dL — ABNORMAL HIGH (ref 6–23)
CO2: 30 mEq/L (ref 19–32)
Calcium: 9.7 mg/dL (ref 8.4–10.5)
Chloride: 101 mEq/L (ref 96–112)
Creatinine, Ser: 1 mg/dL (ref 0.40–1.20)
Glucose, Bld: 89 mg/dL (ref 70–99)

## 2009-10-18 LAB — CBC WITH DIFFERENTIAL/PLATELET
Basophils Absolute: 0 10*3/uL (ref 0.0–0.1)
EOS%: 5.2 % (ref 0.0–7.0)
Eosinophils Absolute: 0.3 10*3/uL (ref 0.0–0.5)
HCT: 37.8 % (ref 34.8–46.6)
HGB: 12.6 g/dL (ref 11.6–15.9)
MCH: 32 pg (ref 25.1–34.0)
MONO#: 0.4 10*3/uL (ref 0.1–0.9)
NEUT#: 3 10*3/uL (ref 1.5–6.5)
NEUT%: 55 % (ref 38.4–76.8)
lymph#: 1.8 10*3/uL (ref 0.9–3.3)

## 2009-10-23 ENCOUNTER — Ambulatory Visit: Payer: Self-pay | Admitting: Internal Medicine

## 2009-11-02 ENCOUNTER — Ambulatory Visit: Payer: Self-pay | Admitting: Internal Medicine

## 2009-11-08 ENCOUNTER — Ambulatory Visit: Payer: Self-pay | Admitting: Internal Medicine

## 2009-11-17 ENCOUNTER — Ambulatory Visit: Payer: Self-pay | Admitting: Internal Medicine

## 2009-11-22 ENCOUNTER — Ambulatory Visit: Payer: Self-pay | Admitting: Internal Medicine

## 2009-11-28 ENCOUNTER — Ambulatory Visit: Payer: Self-pay | Admitting: Internal Medicine

## 2009-12-06 ENCOUNTER — Ambulatory Visit: Payer: Self-pay | Admitting: Internal Medicine

## 2009-12-07 ENCOUNTER — Ambulatory Visit: Payer: Self-pay | Admitting: Internal Medicine

## 2009-12-13 ENCOUNTER — Ambulatory Visit: Payer: Self-pay | Admitting: Internal Medicine

## 2009-12-19 ENCOUNTER — Ambulatory Visit: Payer: Self-pay | Admitting: Internal Medicine

## 2009-12-27 ENCOUNTER — Ambulatory Visit: Payer: Self-pay | Admitting: Internal Medicine

## 2010-01-03 ENCOUNTER — Ambulatory Visit: Payer: Self-pay | Admitting: Internal Medicine

## 2010-01-08 ENCOUNTER — Ambulatory Visit: Payer: Self-pay | Admitting: Internal Medicine

## 2010-01-19 ENCOUNTER — Ambulatory Visit: Payer: Self-pay | Admitting: Internal Medicine

## 2010-01-24 ENCOUNTER — Ambulatory Visit: Payer: Self-pay | Admitting: Internal Medicine

## 2010-01-29 ENCOUNTER — Ambulatory Visit: Payer: Self-pay | Admitting: Internal Medicine

## 2010-01-30 ENCOUNTER — Emergency Department (HOSPITAL_COMMUNITY): Admission: EM | Admit: 2010-01-30 | Discharge: 2010-01-30 | Payer: Self-pay | Admitting: Emergency Medicine

## 2010-02-05 ENCOUNTER — Ambulatory Visit: Payer: Self-pay | Admitting: Internal Medicine

## 2010-02-13 ENCOUNTER — Ambulatory Visit: Payer: Self-pay | Admitting: Internal Medicine

## 2010-02-15 ENCOUNTER — Encounter: Payer: Self-pay | Admitting: Internal Medicine

## 2010-02-19 ENCOUNTER — Ambulatory Visit: Payer: Self-pay | Admitting: Internal Medicine

## 2010-02-27 ENCOUNTER — Ambulatory Visit: Payer: Self-pay | Admitting: Internal Medicine

## 2010-03-08 ENCOUNTER — Ambulatory Visit: Payer: Self-pay | Admitting: Internal Medicine

## 2010-03-14 ENCOUNTER — Ambulatory Visit: Payer: Self-pay | Admitting: Internal Medicine

## 2010-03-21 ENCOUNTER — Ambulatory Visit: Payer: Self-pay | Admitting: Internal Medicine

## 2010-03-26 ENCOUNTER — Ambulatory Visit: Payer: Self-pay | Admitting: Internal Medicine

## 2010-04-03 ENCOUNTER — Ambulatory Visit: Payer: Self-pay | Admitting: Internal Medicine

## 2010-04-09 ENCOUNTER — Ambulatory Visit: Payer: Self-pay | Admitting: Internal Medicine

## 2010-04-10 ENCOUNTER — Ambulatory Visit: Payer: Self-pay | Admitting: Internal Medicine

## 2010-04-16 ENCOUNTER — Ambulatory Visit: Payer: Self-pay | Admitting: Internal Medicine

## 2010-04-17 ENCOUNTER — Encounter: Admission: RE | Admit: 2010-04-17 | Discharge: 2010-04-17 | Payer: Self-pay | Admitting: Internal Medicine

## 2010-04-18 ENCOUNTER — Ambulatory Visit: Payer: Self-pay | Admitting: Internal Medicine

## 2010-04-18 ENCOUNTER — Ambulatory Visit (HOSPITAL_COMMUNITY): Admission: RE | Admit: 2010-04-18 | Discharge: 2010-04-18 | Payer: Self-pay | Admitting: Internal Medicine

## 2010-04-18 LAB — CBC WITH DIFFERENTIAL/PLATELET
Basophils Absolute: 0 10*3/uL (ref 0.0–0.1)
EOS%: 5 % (ref 0.0–7.0)
HCT: 36.9 % (ref 34.8–46.6)
HGB: 12.8 g/dL (ref 11.6–15.9)
MCH: 32.5 pg (ref 25.1–34.0)
MCV: 93.6 fL (ref 79.5–101.0)
MONO%: 6.3 % (ref 0.0–14.0)
NEUT%: 53 % (ref 38.4–76.8)
RDW: 12.9 % (ref 11.2–14.5)

## 2010-04-18 LAB — COMPREHENSIVE METABOLIC PANEL
AST: 18 U/L (ref 0–37)
Alkaline Phosphatase: 58 U/L (ref 39–117)
BUN: 19 mg/dL (ref 6–23)
Creatinine, Ser: 1.05 mg/dL (ref 0.40–1.20)
Total Bilirubin: 0.9 mg/dL (ref 0.3–1.2)

## 2010-04-24 ENCOUNTER — Ambulatory Visit: Payer: Self-pay | Admitting: Internal Medicine

## 2010-05-01 ENCOUNTER — Ambulatory Visit: Payer: Self-pay | Admitting: Psychiatry

## 2010-05-01 ENCOUNTER — Ambulatory Visit: Payer: Self-pay | Admitting: Internal Medicine

## 2010-05-08 ENCOUNTER — Ambulatory Visit: Payer: Self-pay | Admitting: Internal Medicine

## 2010-05-16 ENCOUNTER — Ambulatory Visit: Payer: Self-pay | Admitting: Internal Medicine

## 2010-05-16 ENCOUNTER — Ambulatory Visit: Payer: Self-pay | Admitting: Psychiatry

## 2010-05-22 ENCOUNTER — Ambulatory Visit: Payer: Self-pay | Admitting: Internal Medicine

## 2010-05-31 ENCOUNTER — Ambulatory Visit: Payer: Self-pay | Admitting: Internal Medicine

## 2010-06-08 ENCOUNTER — Ambulatory Visit: Payer: Self-pay | Admitting: Internal Medicine

## 2010-06-14 ENCOUNTER — Ambulatory Visit: Payer: Self-pay | Admitting: Internal Medicine

## 2010-06-20 ENCOUNTER — Ambulatory Visit: Payer: Self-pay | Admitting: Internal Medicine

## 2010-06-27 ENCOUNTER — Ambulatory Visit: Payer: Self-pay | Admitting: Internal Medicine

## 2010-07-02 ENCOUNTER — Telehealth (INDEPENDENT_AMBULATORY_CARE_PROVIDER_SITE_OTHER): Payer: Self-pay | Admitting: *Deleted

## 2010-07-04 ENCOUNTER — Ambulatory Visit: Payer: Self-pay | Admitting: Internal Medicine

## 2010-07-11 ENCOUNTER — Ambulatory Visit: Payer: Self-pay | Admitting: Internal Medicine

## 2010-07-16 ENCOUNTER — Ambulatory Visit: Payer: Self-pay | Admitting: Internal Medicine

## 2010-07-24 ENCOUNTER — Ambulatory Visit: Payer: Self-pay | Admitting: Internal Medicine

## 2010-08-01 ENCOUNTER — Ambulatory Visit: Payer: Self-pay | Admitting: Internal Medicine

## 2010-08-08 ENCOUNTER — Ambulatory Visit: Payer: Self-pay | Admitting: Internal Medicine

## 2010-08-14 ENCOUNTER — Ambulatory Visit: Payer: Self-pay | Admitting: Internal Medicine

## 2010-08-15 ENCOUNTER — Ambulatory Visit: Payer: Self-pay | Admitting: Internal Medicine

## 2010-08-21 ENCOUNTER — Ambulatory Visit: Payer: Self-pay | Admitting: Internal Medicine

## 2010-08-24 ENCOUNTER — Telehealth (INDEPENDENT_AMBULATORY_CARE_PROVIDER_SITE_OTHER): Payer: Self-pay | Admitting: *Deleted

## 2010-08-28 ENCOUNTER — Ambulatory Visit: Payer: Self-pay | Admitting: Internal Medicine

## 2010-09-03 ENCOUNTER — Ambulatory Visit: Payer: Self-pay | Admitting: Internal Medicine

## 2010-09-10 ENCOUNTER — Ambulatory Visit: Payer: Self-pay | Admitting: Internal Medicine

## 2010-09-18 ENCOUNTER — Ambulatory Visit: Payer: Self-pay | Admitting: Internal Medicine

## 2010-09-25 ENCOUNTER — Ambulatory Visit: Payer: Self-pay | Admitting: Internal Medicine

## 2010-10-02 ENCOUNTER — Ambulatory Visit: Payer: Self-pay | Admitting: Internal Medicine

## 2010-10-09 ENCOUNTER — Ambulatory Visit: Payer: Self-pay | Admitting: Internal Medicine

## 2010-10-17 ENCOUNTER — Ambulatory Visit: Payer: Self-pay | Admitting: Internal Medicine

## 2010-10-23 ENCOUNTER — Ambulatory Visit: Payer: Self-pay | Admitting: Internal Medicine

## 2010-11-02 ENCOUNTER — Ambulatory Visit: Payer: Self-pay | Admitting: Internal Medicine

## 2010-11-13 ENCOUNTER — Ambulatory Visit
Admission: RE | Admit: 2010-11-13 | Discharge: 2010-11-13 | Payer: Self-pay | Source: Home / Self Care | Attending: Internal Medicine | Admitting: Internal Medicine

## 2010-11-13 ENCOUNTER — Other Ambulatory Visit: Payer: Self-pay | Admitting: Internal Medicine

## 2010-11-13 DIAGNOSIS — M542 Cervicalgia: Secondary | ICD-10-CM | POA: Insufficient documentation

## 2010-11-13 LAB — BASIC METABOLIC PANEL
BUN: 20 mg/dL (ref 6–23)
CO2: 30 mEq/L (ref 19–32)
Calcium: 9.6 mg/dL (ref 8.4–10.5)
Chloride: 102 mEq/L (ref 96–112)
Creatinine, Ser: 1 mg/dL (ref 0.4–1.2)
GFR: 56.44 mL/min — ABNORMAL LOW (ref >60.00)
Glucose, Bld: 81 mg/dL (ref 70–99)
Potassium: 5.2 mEq/L — ABNORMAL HIGH (ref 3.5–5.1)
Sodium: 140 mEq/L (ref 135–145)

## 2010-11-14 ENCOUNTER — Ambulatory Visit: Payer: Self-pay | Admitting: Cardiology

## 2010-11-17 ENCOUNTER — Ambulatory Visit: Payer: Self-pay | Admitting: Internal Medicine

## 2010-11-22 ENCOUNTER — Ambulatory Visit: Payer: Self-pay | Admitting: Internal Medicine

## 2010-11-23 ENCOUNTER — Other Ambulatory Visit: Payer: Self-pay | Admitting: Internal Medicine

## 2010-11-23 ENCOUNTER — Ambulatory Visit: Payer: Self-pay | Admitting: Internal Medicine

## 2010-11-23 DIAGNOSIS — C3490 Malignant neoplasm of unspecified part of unspecified bronchus or lung: Secondary | ICD-10-CM

## 2010-11-25 ENCOUNTER — Encounter: Payer: Self-pay | Admitting: Internal Medicine

## 2010-11-25 ENCOUNTER — Encounter: Payer: Self-pay | Admitting: Thoracic Surgery

## 2010-11-28 ENCOUNTER — Ambulatory Visit: Payer: Self-pay | Admitting: Internal Medicine

## 2010-12-03 ENCOUNTER — Ambulatory Visit: Payer: Self-pay | Admitting: Internal Medicine

## 2010-12-04 NOTE — Medication Information (Signed)
Summary: [facility]  [facility] / [facility]   Imported By: Lennie Odor 02/20/2010 15:36:32  _____________________________________________________________________  External Attachment:    Type:   Image     Comment:   External Document

## 2010-12-04 NOTE — Progress Notes (Signed)
Summary: ear and throat pain  Phone Note Call from Patient   Caller: Patient Call For: young Summary of Call: pt congested with ear and throat pain Initial call taken by: Rickard [name],  July 02, 2010 11:01 AM  Follow-up for Phone Call        pt  c/o sinus congestion, pain and fullness in left ear, sore throat on left side only, cough-nonprod and dry, sob with exertion only, chest may be a little tight not much--pt request ov or meds that would be cheap for her.  [facility] [address]  allergies--pcn and asa Follow-up by: Philipp Deputy CMA,  July 02, 2010 11:15 AM  Additional Follow-up for Phone Call Additional follow up Details #1::        Per CDY-give Doxycycline 100mg  #10 take 2 today then 1 daily til gone no refills and get Sudafed PE OTC take as directed per box and NETI Pot may also help.Reynaldo Minium CMA  July 02, 2010 12:27 PM     Additional Follow-up for Phone Call Additional follow up Details #2::    Called, spoke with pt.  Pt informed of above recs per CY and aware doxy [facility] [address].   Follow-up by: Gweneth Dimitri RN,  July 02, 2010 1:45 PM  New/Updated Medications: DOXYCYCLINE HYCLATE 100 MG CAPS (DOXYCYCLINE HYCLATE) take 2 capsules today then 1 once daily until gone Prescriptions: DOXYCYCLINE HYCLATE 100 MG CAPS (DOXYCYCLINE HYCLATE) take 2 capsules today then 1 once daily until gone  #10 x 0   Entered by:   Gweneth Dimitri RN   Authorized by:   Waymon Budge MD   Signed by:   Gweneth Dimitri RN on 07/02/2010   Method used:   [facility]        [facility]  [phone]* (retail)       [address] [place], [place]  [address]       Ph: [phone] or [phone]       Fax: [phone]   RxID:   [id number]

## 2010-12-04 NOTE — Miscellaneous (Signed)
Summary: Injection Record/[facility]  Injection Record/[facility]   Imported By: Sherian Rein 03/27/2010 13:48:59  _____________________________________________________________________  External Attachment:    Type:   Image     Comment:   External Document

## 2010-12-04 NOTE — Progress Notes (Signed)
Summary: proventil rx  Phone Note Call from Patient Call back at Home Phone [phone]   Caller: Patient Call For: young Reason for Call: Talk to Nurse Summary of Call: Needing rx sent to [facility]--[phone]--proventil Initial call taken by: Lehman Prom,  August 24, 2010 10:45 AM  Follow-up for Phone Call        spoke to pt and this [facility]--told pt rx was sent to [facility] for #3 inhalers with 3 refills--pt verbalized understanding Follow-up by: Philipp Deputy CMA,  August 24, 2010 11:49 AM    Prescriptions: PROVENTIL HFA 108 (90 BASE) MCG/ACT  AERS (ALBUTEROL SULFATE) use as directed  #3 x 3   Entered by:   Philipp Deputy CMA   Authorized by:   Waymon Budge MD   Signed by:   Philipp Deputy CMA on 08/24/2010   Method used:   Faxed to ...       [facility] (mail-order)             , [place]         Ph: [phone]       Fax: [phone]   RxID:   [id number]

## 2010-12-04 NOTE — Letter (Signed)
Summary: [facility]  [facility]   Imported By: Sherian Rein 11/16/2009 12:18:47  _____________________________________________________________________  External Attachment:    Type:   Image     Comment:   External Document

## 2010-12-04 NOTE — Assessment & Plan Note (Signed)
Summary: 6 months/apc   Primary Provider/Referring Provider:  Clinton Sawyer  CC:  Follow up visit-allergies; still having pressure in head and in ears..  History of Present Illness:  2009-07-25- Asthma/ copd, allergic rhinitis, remote hx lung cancer Feels a bit puffy around eyes, but likes the cooler weather and deneies nasal congestion, drainage, chest tightness, cough or wheeze.  Allergy vaccine doing well. Asks about flu vaccine. Has had pneumnia vaccine twice. CXR 01/2009- COPD with no cancer recurrence.  January 08, 2010- Asthma/ COPD, allergic rhinitis, remote hx lung cancer Doing pretty well. So far the early allergy season isn't bothering her. She takes Claritin-D24 if needed and we discussed availability of allegra otc now. Had flu vax. Denies wheeze or cough.  Has not had PFT in EMR.  July 16, 2010- Asthma/ COPD, Allergic rhinitis, Remote hx Lung Ca She had called 8/29 with left ear pressure pain and was advised to try Neti pot and doxy. She says not better today. Hurts left side of head, into her shoulders. Not blowing out anything. Tried ear drops for swimmers ear.     Asthma History    Initial Asthma Severity Rating:    Age range: 12+ years    Symptoms: 0-2 days/week    Nighttime Awakenings: 0-2/month    Interferes w/ normal activity: no limitations    SABA use (not for EIB): 0-2 days/week    Asthma Severity Assessment: Intermittent   Preventive Screening-Counseling & Management  Alcohol-Tobacco     Smoking Status: quit     Year Quit: 1994     Pack years: 40 years 2 packs daily  Current Medications (verified): 1)  Advair Diskus 100-50 Mcg/dose  Misc (Fluticasone-Salmeterol) .Marland Kitchen.. 1 Puff Two Times A Day 2)  Metoprolol Succinate 50 Mg  Tb24 (Metoprolol Succinate) .... Take 1 Tablet By Mouth Once A Day 3)  Tramadol Hcl 50 Mg  Tabs (Tramadol Hcl) .... Take 1 Tablet By Mouth Three Times A Day As Needed 4)  Nasonex 50 Mcg/act  Susp (Mometasone Furoate) .Marland Kitchen.. 1-2  Sprays Each Nostril Daily 5)  Claritin-D 24 Hour 10-240 Mg  Tb24 (Loratadine-Pseudoephedrine) .... Take 1 Tablet By Mouth Once A Day 6)  Proventil Hfa 108 (90 Base) Mcg/act  Aers (Albuterol Sulfate) .... Use As Directed 7)  Allergy Vaccine Gh 1:10 8)  Caltrate 600+d 600-400 Mg-Unit  Tabs (Calcium Carbonate-Vitamin D) .... Take 1 Tablet By Mouth Two Times A Day 9)  Fish Oil 1000 Mg  Caps (Omega-3 Fatty Acids) .... Take 1 Tablet By Mouth Two Times A Day 10)  Diazepam 5 Mg  Tabs (Diazepam) .... Take 1/2 At Bedtime As Needed 11)  Mobic 15 Mg  Tabs (Meloxicam) .... Take 1/2 To 1 Tab By Mouth As Needed 12)  Acetaminophen Pm Extra Strength .... Take1 Tabs By Mouth At Bedtime 13)  Multivitamins   Tabs (Multiple Vitamin) .... Take 1 Tablet By Mouth Once A Day 14)  Vitamin D 1.25mg  .... Take 1 Tab By Mouth Once A Month 15)  Pepcid 20 Mg  Tabs (Famotidine) .... Once Daily  Allergies (verified): 1)  Penicillin 2)  Aspirin  Past History:  Past Medical History: Last updated: 09/08/2008 ASTHMA, CHRONIC OBSTRUCTIVE NOS (ICD-493.20) NEOP, MALIGNANT, BRONCHUS/LUNG NOS (ICD-162.9)-RULobec and chemotherapy.Stg 1B NSCCA/BAC RHINITIS, ALLERGIC NOS (ICD-477.9) Hx of ANGIOEDEMA (ICD-995.1)    Past Surgical History: Last updated: 03/08/2008 right upper lobectomy  Family History: Last updated: 2009/07/25 Asthma Mother- died Alzheimers, CVA age 73 Father- died MVA  Social History: Last updated:  11/09/2007 Patient states former smoker- 2 ppd x 50 yrs  Risk Factors: Smoking Status: quit (07/16/2010)  Review of Systems      See HPI  The patient denies anorexia, fever, weight loss, weight gain, vision loss, decreased hearing, hoarseness, chest pain, syncope, dyspnea on exertion, peripheral edema, prolonged cough, hemoptysis, abdominal pain, severe indigestion/heartburn, and enlarged lymph nodes.    Vital Signs:  Patient profile:   77 year old female Height:      63 inches Weight:      166.13  pounds BMI:     29.53 O2 Sat:      96 % on Room air Pulse rate:   77 / minute BP sitting:   118 / 70  (left arm) Cuff size:   regular  Vitals Entered By: Reynaldo Minium CMA (July 16, 2010 1:38 PM)  O2 Flow:  Room air CC: Follow up visit-allergies; still having pressure in head and in ears.   Physical Exam  Additional Exam:  General: A/Ox3; pleasant and cooperative, NAD, overweight, calm, mild cosmetic periorbital puffiness- not obvious SKIN: no rash, lesions NODES: no lymphadenopathy HEENT: [place]/AT, EOM- WNL, Conjuctivae- clear, PERRLA, TM-Canals are clear- but excoriated on left. Not red or bulging.L, Nose- clear, Throat- clear and wnl, Mallampati  II NECK: Supple w/ fair ROM, JVD- none, normal carotid impulses w/o bruits Thyroid-  CHEST: Clear to P&A, no rales, wheeze, rhonchi or cough HEART: RRR, no m/g/r heard ABDOMEN: overweight XBJ:YNWG, nl pulses, no edema  NEURO: tremor hands and jaw      Impression & Recommendations:  Problem # 1:  RHINITIS, ALLERGIC NOS (ICD-477.9)  Rhinosinusitis with eustachian dysfunction. she is making matters worse by trying to mechanically clear her canal, which is now excoriated but not blocked. I think we can help with decongestants and another antibiotic trial. Her updated medication list for this problem includes:    Nasonex 50 Mcg/act Susp (Mometasone furoate) .Marland Kitchen... 1-2 sprays each nostril daily  Orders: Est. Patient Level IV (95621) Nebulizer Tx (30865)  Problem # 2:  ASTHMA, CHRONIC OBSTRUCTIVE NOS (ICD-493.20) This is currently controlled.   Problem # 3:  NEOP, MALIGNANT, BRONCHUS/LUNG NOS (ICD-162.9) No evident recurrence.  We reviewed stable CXR from 01/2009 and will update today.  Medications Added to Medication List This Visit: 1)  Acetaminophen Pm Extra Strength  .... Take1 tabs by mouth at bedtime 2)  Clarithromycin 500 Mg Tabs (Clarithromycin) .Marland Kitchen.. 1 after meals, twice daily  Other Orders: Flu Vaccine 73yrs +  MEDICARE PATIENTS (H8469) Administration Flu vaccine - MCR (G0008) T-2 View CXR (71020TC)  Patient Instructions: 1)  Please schedule a follow-up appointment in 4 months. 2)  Script for antibiotic biaxin/ clarithromycin sent to your drug store 3)  Neb neo 4)  Flu vax 5)  Suggest you try a decongestant like Sudafed or Sudafed PE, taken in the morning, up to twice daily if needed. To avoid being kept awake at night, don't take decongestants later than lunch time. Prescriptions: CLARITHROMYCIN 500 MG TABS (CLARITHROMYCIN) 1 after meals, twice daily  #14 x 0   Entered and Authorized by:   Waymon Budge MD   Signed by:   Waymon Budge MD on 07/16/2010   Method used:   [facility] to        [facility]  [phone]* (retail)       [address] [place], [place]  [address]       Ph: [phone] or [phone]  Fax: [phone]   RxID:   [id number]   Flu Vaccine Consent Questions     Do you have a history of severe allergic reactions to this vaccine? no    Any prior history of allergic reactions to egg and/or gelatin? no    Do you have a sensitivity to the preservative Thimersol? no    Do you have a past history of Guillan-Barre Syndrome? no    Do you currently have an acute febrile illness? no    Have you ever had a severe reaction to latex? no    Vaccine information given and explained to patient? yes    Are you currently pregnant? no    Lot Number:[id number]   Exp Date:05/04/2011   Site Given  Left Deltoid IMflu Reynaldo Minium CMA  July 16, 2010 5:36 PM   Medication Administration  Medication # 1:    Medication: EMR miscellaneous medications    Diagnosis: RHINITIS, ALLERGIC NOS (ICD-477.9)    Dose: 3 drops    Route: intranasal    Exp Date: 05/2011    Lot #: [id number]    Mfr: Bayer    Comments: Neo-Synephrine    Patient tolerated medication without complications    Given by: Reynaldo Minium CMA (July 16, 2010 5:37 PM)  Orders Added: 1)  Est.  Patient Level IV [88416] 2)  Flu Vaccine 82yrs + MEDICARE PATIENTS [Q2039] 3)  Administration Flu vaccine - MCR [G0008] 4)  Nebulizer Tx [94640] 5)  T-2 View CXR [71020TC]

## 2010-12-04 NOTE — Letter (Signed)
[facility]   Imported By: Sherian Rein 05/14/2010 09:06:17  _____________________________________________________________________  External Attachment:    Type:   Image     Comment:   External Document

## 2010-12-04 NOTE — Miscellaneous (Signed)
Summary: Injection Record / [facility]    Injection Record / [facility]    Imported By: Lennie Odor 07/06/2010 10:48:07  _____________________________________________________________________  External Attachment:    Type:   Image     Comment:   External Document

## 2010-12-04 NOTE — Assessment & Plan Note (Signed)
Summary: 6 months/ mbw   Primary Provider/Referring Provider:  Clinton Sawyer  CC:  6 month follow up visit.  History of Present Illness:  History of Present Illness: 09/08/08- Asthma/ COPD, Allergic rhinitis Went to walk-in clinic early Oct for bronchitis sydrome, Rx'd Levaquin. Improved. Now onset yest of hoarse, "yellow spots in throat", not sore. denies chest cong or cough, fever, GI or GU.  01/10/09- asthma,COPD, allergic rhinits, remote hx lung cancer Persitent cough producitve of white phlegm- seems to have increased since last here.. Some dyspnea with exertion. Hands may swell but not much fluid in legs. May wake in AM with throat tight "dry" but better since she got Oasis moisturizing spray for dry mouth. Gets wheezey- transient. Not aware of heart burn. Nose stops up often and eyes hurt. Little nasal discharge. Gets CT chest every 6 months with Dr Shirline Frees f/u [facility] s/p RULresectron 2006.  2009/07/16- Asthma/ copd, allergic rhinitis, remote hx lung cancer Feels a bit puffy around eyes, but likes the cooler weather and deneies nasal congestion, drainage, chest tightness, cough or wheeze.  Allergy vaccine doing well. Asks about flu vaccine. Has had pneumnia vaccine twice. CXR 01/2009- COPD with no cancer recurrence.  January 08, 2010- Asthma/ COPD, allergic rhinitis, remote hx lung cancer Doing pretty well. So far the early allergy season isn't bothering her. She takes Claritin-D24 if needed and we discussed availability of allegra otc now. Had flu vax. Denies wheeze or cough.  Has not had PFT in EMR.    Current Medications (verified): 1)  Advair Diskus 100-50 Mcg/dose  Misc (Fluticasone-Salmeterol) .Marland Kitchen.. 1 Puff Two Times A Day 2)  Metoprolol Succinate 50 Mg  Tb24 (Metoprolol Succinate) .... Take 1 Tablet By Mouth Once A Day 3)  Tramadol Hcl 50 Mg  Tabs (Tramadol Hcl) .... Take 1 Tablet By Mouth Three Times A Day As Needed 4)  Nasonex 50 Mcg/act  Susp (Mometasone Furoate) .Marland Kitchen.. 1-2 Sprays  Each Nostril Daily 5)  Claritin-D 24 Hour 10-240 Mg  Tb24 (Loratadine-Pseudoephedrine) .... Take 1 Tablet By Mouth Once A Day 6)  Proventil Hfa 108 (90 Base) Mcg/act  Aers (Albuterol Sulfate) .... Use As Directed 7)  Allergy Vaccine Gh 1:10 Next Order 8)  Caltrate 600+d 600-400 Mg-Unit  Tabs (Calcium Carbonate-Vitamin D) .... Take 1 Tablet By Mouth Two Times A Day 9)  Fish Oil 1000 Mg  Caps (Omega-3 Fatty Acids) .... Take 1 Tablet By Mouth Two Times A Day 10)  Diazepam 5 Mg  Tabs (Diazepam) .... Take 1/2 At Bedtime As Needed 11)  Mobic 15 Mg  Tabs (Meloxicam) .... Take 1/2 To 1 Tab By Mouth As Needed 12)  Acetaminophen Pm Extra Strength .... Take 2 Tabs By Mouth At Bedtime 13)  Multivitamins   Tabs (Multiple Vitamin) .... Take 1 Tablet By Mouth Once A Day 14)  Vitamin D 1.25mg  .... Take 1 Tab By Mouth Once A Month 15)  Pepcid 20 Mg  Tabs (Famotidine) .... Once Daily  Allergies (verified): 1)  Penicillin 2)  Aspirin  Past History:  Past Medical History: Last updated: 09/08/2008 ASTHMA, CHRONIC OBSTRUCTIVE NOS (ICD-493.20) NEOP, MALIGNANT, BRONCHUS/LUNG NOS (ICD-162.9)-RULobec and chemotherapy.Stg 1B NSCCA/BAC RHINITIS, ALLERGIC NOS (ICD-477.9) Hx of ANGIOEDEMA (ICD-995.1)    Past Surgical History: Last updated: 03/08/2008 right upper lobectomy  Family History: Last updated: 2009-07-16 Asthma Mother- died Alzheimers, CVA age 39 Father- died MVA  Social History: Last updated: 11/09/2007 Patient states former smoker- 2 ppd x 50 yrs  Risk Factors: Smoking Status: quit (  01/10/2009)  Review of Systems      See HPI  The patient denies anorexia, fever, weight loss, weight gain, vision loss, decreased hearing, hoarseness, chest pain, syncope, dyspnea on exertion, peripheral edema, prolonged cough, headaches, hemoptysis, abdominal pain, and severe indigestion/heartburn.    Vital Signs:  Patient profile:   77 year old female Height:      63 inches Weight:      174.25  pounds BMI:     30.98 O2 Sat:      94 % on Room air Pulse rate:   82 / minute BP sitting:   150 / 78  (right arm) Cuff size:   regular  Vitals Entered By: Reynaldo Minium CMA (January 08, 2010 2:19 PM)  O2 Flow:  Room air  Physical Exam  Additional Exam:  General: A/Ox3; pleasant and cooperative, NAD, overweight, calm, mild cosmetic periorbital puffiness- not obvious SKIN: no rash, lesions NODES: no lymphadenopathy HEENT: [place]/AT, EOM- WNL, Conjuctivae- clear, PERRLA, TM-WNL, Nose- clear, Throat- clear and wnl, melampatti II NECK: Supple w/ fair ROM, JVD- none, normal carotid impulses w/o bruits Thyroid-  CHEST: Clear to P&A, no rales, wheeze, rhonchi or cough HEART: RRR, no m/g/r heard ABDOMEN:  QMG:QQPY, nl pulses, no edema  NEURO: tremor hands and jaw      Impression & Recommendations:  Problem # 1:  ASTHMA, CHRONIC OBSTRUCTIVE NOS (ICD-493.20) Good control for early Spring season. We discussed management of exposure while mowing later in Spring. Consider PFT.  Problem # 2:  RHINITIS, ALLERGIC NOS (ICD-477.9)  She continues allergy vaccine succdessfully. Her updated medication list for this problem includes:    Nasonex 50 Mcg/act Susp (Mometasone furoate) .Marland Kitchen... 1-2 sprays each nostril daily  Medications Added to Medication List This Visit: 1)  Allergy Vaccine Gh 1:10  2)  Vitamin D 1.25mg   .... Take 1 tab by mouth once a month  Other Orders: Est. Patient Level II (19509)  Patient Instructions: 1)  Please schedule a follow-up appointment in 6 months. 2)  Please call if we can help.

## 2010-12-04 NOTE — Miscellaneous (Signed)
Summary: Injection Record / [facility]    Injection Record / [facility]    Imported By: Lennie Odor 09/11/2010 14:17:06  _____________________________________________________________________  External Attachment:    Type:   Image     Comment:   External Document

## 2010-12-04 NOTE — Miscellaneous (Signed)
Summary: Injection Record/[facility]  Injection Record/[facility]   Imported By: Sherian Rein 03/07/2010 15:11:18  _____________________________________________________________________  External Attachment:    Type:   Image     Comment:   External Document

## 2010-12-06 NOTE — Assessment & Plan Note (Signed)
Summary: ROV 4 MONTHS///KP   Primary Provider/Referring Provider:  Clinton Sawyer  CC:  4 month followup allergies;, c/o sinus sore back of neck, occasional cough grayiish in am, wheeze quite a lot, and sob with exertion.  History of Present Illness: 07/11/09- Asthma/ copd, allergic rhinitis, remote hx lung cancer Feels a bit puffy around eyes, but likes the cooler weather and deneies nasal congestion, drainage, chest tightness, cough or wheeze.  Allergy vaccine doing well. Asks about flu vaccine. Has had pneumnia vaccine twice. CXR 01/2009- COPD with no cancer recurrence.  January 08, 2010- Asthma/ COPD, allergic rhinitis, remote hx lung cancer Doing pretty well. So far the early allergy season isn't bothering her. She takes Claritin-D24 if needed and we discussed availability of allegra otc now. Had flu vax. Denies wheeze or cough.  Has not had PFT in EMR.  July 16, 2010- Asthma/ COPD, Allergic rhinitis, Remote hx Lung Ca She had called 8/29 with left ear pressure pain and was advised to try Neti pot and doxy. She says not better today. Hurts left side of head, into her shoulders. Not blowing out anything. Tried ear drops for swimmers ear.  November 13, 2010- Asthma/ COPD, allergic rhinitis, remote hx lung cancer Nurse-CC: 4 month followup allergies;, c/o sinus sore back of neck, occasional cough grayiish in am, wheeze quite a lot, sob with exertion CXR 07/2010- COPD, NAD since 01/2009 Says she still hurts, now described as inside her throat, around her neck, and also in left frontal area- these are variable and not at all clear these are related to each other. In particular tender to touch on mastoid areas superficially. . Reviewed smoking hx. Nasal discharge clear- Rx'd otc. Some wheeze if active Breathing comfortable once settled in bed. Using proventil several times daily and Advair two times a day.  Continues allergy vaccine w/o problem or concern.     Asthma History    Asthma  Control Assessment:    Age range: 12+ years    Symptoms: >2 days/week    Nighttime Awakenings: 0-2/month    Interferes w/ normal activity: no limitations    SABA use (not for EIB): several times per day    Asthma Control Assessment: Very Poorly Controlled   Preventive Screening-Counseling & Management  Alcohol-Tobacco     Smoking Status: quit     Packs/Day: 1.0     Year Started: After [employer] Quit: 1990  Current Medications (verified): 1)  Advair Diskus 100-50 Mcg/dose  Misc (Fluticasone-Salmeterol) .Marland Kitchen.. 1 Puff Two Times A Day 2)  Metoprolol Succinate 50 Mg  Tb24 (Metoprolol Succinate) .... Take 1 Tablet By Mouth Once A Day 3)  Tramadol Hcl 50 Mg  Tabs (Tramadol Hcl) .... Take 1 Tablet By Mouth Three Times A Day As Needed 4)  Nasonex 50 Mcg/act  Susp (Mometasone Furoate) .Marland Kitchen.. 1-2 Sprays Each Nostril Daily 5)  Claritin-D 24 Hour 10-240 Mg  Tb24 (Loratadine-Pseudoephedrine) .... Take 1 Tablet By Mouth Once A Day 6)  Proventil Hfa 108 (90 Base) Mcg/act  Aers (Albuterol Sulfate) .... Use As Directed 7)  Allergy Vaccine Gh 1:10 8)  Caltrate 600+d 600-400 Mg-Unit  Tabs (Calcium Carbonate-Vitamin D) .... Take 1 Tablet By Mouth Two Times A Day 9)  Fish Oil 1000 Mg  Caps (Omega-3 Fatty Acids) .... Take 1 Tablet By Mouth Two Times A Day 10)  Diazepam 5 Mg  Tabs (Diazepam) .... Take 1/2 At Bedtime As Needed 11)  Mobic 15 Mg  Tabs (  Meloxicam) .... Take 1/2 To 1 Tab By Mouth As Needed 12)  Acetaminophen Pm Extra Strength .... Take1 Tabs By Mouth At Bedtime 13)  Vitamin D 1.25mg  .... Take 1 Tab By Mouth Once A Month 14)  Pepcid 20 Mg  Tabs (Famotidine) .... Once Daily  Allergies: 1)  Penicillin 2)  Aspirin  Past History:  Past Surgical History: Last updated: 03/08/2008 right upper lobectomy  Family History: Last updated: 07/14/09 Asthma Mother- died Alzheimers, CVA age 49 Father- died MVA  Social History: Last updated: 11/09/2007 Patient states former smoker- 2 ppd  x 50 yrs  Risk Factors: Smoking Status: quit (11/13/2010) Packs/Day: 1.0 (11/13/2010)  Past Medical History: ASTHMA, CHRONIC OBSTRUCTIVE NOS (ICD-493.20) NEOP, MALIGNANT, BRONCHUS/LUNG NOS (ICD-162.9)-RULobec and chemotherapy.Stg 1B NSCCA/BAC RHINITIS, ALLERGIC NOS (ICD-477.9) Sinusitis- ethmoid 2012 Hx of ANGIOEDEMA (ICD-995.1)    Social History: Packs/Day:  1.0  Review of Systems      See HPI       The patient complains of shortness of breath with activity, sore throat, headaches, and nasal congestion/difficulty breathing through nose.  The patient denies shortness of breath at rest, productive cough, non-productive cough, coughing up blood, chest pain, irregular heartbeats, acid heartburn, indigestion, loss of appetite, weight change, abdominal pain, difficulty swallowing, tooth/dental problems, sneezing, rash, change in color of mucus, and fever.    Vital Signs:  Patient profile:   77 year old female Height:      63 inches Weight:      168.13 pounds O2 Sat:      96 % on Room air Pulse rate:   77 / minute BP sitting:   150 / 90  (left arm) Cuff size:   regular  Vitals Entered By: Kandice Hams CMA (November 13, 2010 1:32 PM)  O2 Flow:  Room air CC: 4 month followup allergies;, c/o sinus sore back of neck, occasional cough grayiish in am, wheeze quite a lot, sob with exertion Comments pt would like a handicap placard pharmacy verfied   Physical Exam  Additional Exam:  General: A/Ox3; pleasant and cooperative, NAD, overweight, calm,  SKIN: no rash, lesions NODES: no lymphadenopathy HEENT: [place]/AT, EOM- WNL, Conjuctivae- clear, PERRLA, TM-Canals are clear-. , Nose- clear, Throat- clear and wnl, Mallampati  II NECK: Supple w/ fair ROM, JVD- none, normal carotid impulses w/o bruits Thyroid-  CHEST: Clear to P&A, no rales, wheeze, rhonchi or cough HEART: RRR, no m/g/r heard ABDOMEN: overweight ZOX:WRUE, nl pulses, no edema  NEURO: tremor hands and jaw, tense neck  muscles      Impression & Recommendations:  Problem # 1:  NECK PAIN (ICD-723.1)  She continues to complain of pains around her neck in a way that suggests muscle tension related to her tic/tremor might contribute. I suggested we get CT of head and neck to exclude anatomic basis. She will keep pending annual visit with her primary doctor.   Problem # 2:  ASTHMA, CHRONIC OBSTRUCTIVE NOS (ICD-493.20) Mild exertional wheeze. Control is less good and I will try stronger Advair for stabilixzation.   Problem # 3:  RHINITIS, ALLERGIC NOS (ICD-477.9)  Continue allergy vaccine with discussion I question possibility of sinusitis and will look for that with the CT we are gettijng. Her updated medication list for this problem includes:    Nasonex 50 Mcg/act Susp (Mometasone furoate) .Marland Kitchen... 1-2 sprays each nostril daily  Medications Added to Medication List This Visit: 1)  Advair Diskus 250-50 Mcg/dose Aepb (Fluticasone-salmeterol) .Marland Kitchen.. 1 puff and rinse, twice daily  Other Orders:  Est. Patient Level IV (16109) TLB-BMP (Basic Metabolic Panel-BMET) (80048-METABOL) Radiology Referral (Radiology)  Patient Instructions: 1)  Please schedule a follow-up appointment in 2 months. 2)  See [facility] to schedule CT head and neck 3)  Lab- needed  4)  Sample and Script to change to Advair 250/50-  5)      1 puff and rinse mouth, twice daily Prescriptions: ADVAIR DISKUS 250-50 MCG/DOSE AEPB (FLUTICASONE-SALMETEROL) 1 puff and rinse, twice daily  #1 x prn    Entered and Authorized by:   Waymon Budge MD   Signed by:   Waymon Budge MD on 11/13/2010   Method used:   Print then Give to Patient   RxID:   [id number]

## 2010-12-11 ENCOUNTER — Encounter: Payer: Self-pay | Admitting: Internal Medicine

## 2010-12-11 DIAGNOSIS — J301 Allergic rhinitis due to pollen: Secondary | ICD-10-CM

## 2010-12-18 ENCOUNTER — Ambulatory Visit (INDEPENDENT_AMBULATORY_CARE_PROVIDER_SITE_OTHER): Payer: Medicare Other

## 2010-12-18 DIAGNOSIS — J301 Allergic rhinitis due to pollen: Secondary | ICD-10-CM

## 2010-12-25 ENCOUNTER — Ambulatory Visit (INDEPENDENT_AMBULATORY_CARE_PROVIDER_SITE_OTHER): Payer: Medicare Other

## 2010-12-25 DIAGNOSIS — J301 Allergic rhinitis due to pollen: Secondary | ICD-10-CM

## 2010-12-31 ENCOUNTER — Encounter: Payer: Self-pay | Admitting: Internal Medicine

## 2010-12-31 ENCOUNTER — Ambulatory Visit (INDEPENDENT_AMBULATORY_CARE_PROVIDER_SITE_OTHER): Payer: Medicare Other

## 2010-12-31 DIAGNOSIS — J301 Allergic rhinitis due to pollen: Secondary | ICD-10-CM

## 2011-01-01 ENCOUNTER — Ambulatory Visit (INDEPENDENT_AMBULATORY_CARE_PROVIDER_SITE_OTHER): Payer: Medicare Other

## 2011-01-01 DIAGNOSIS — J301 Allergic rhinitis due to pollen: Secondary | ICD-10-CM

## 2011-01-01 NOTE — Miscellaneous (Signed)
Summary: Injection Record / [facility]   Injection Record / [facility]   Imported By: Lennie Odor 12/28/2010 13:53:20  _____________________________________________________________________  External Attachment:    Type:   Image     Comment:   External Document

## 2011-01-02 ENCOUNTER — Encounter: Payer: Self-pay | Admitting: Internal Medicine

## 2011-01-08 ENCOUNTER — Ambulatory Visit (INDEPENDENT_AMBULATORY_CARE_PROVIDER_SITE_OTHER): Payer: Medicare Other

## 2011-01-08 ENCOUNTER — Encounter: Payer: Self-pay | Admitting: Internal Medicine

## 2011-01-08 DIAGNOSIS — J301 Allergic rhinitis due to pollen: Secondary | ICD-10-CM

## 2011-01-10 NOTE — Assessment & Plan Note (Signed)
Summary: ALLERGY/CB  Nurse Visit   Allergies: 1)  Penicillin 2)  Aspirin  Orders Added: 1)  Allergy Injection (1) [95115] 

## 2011-01-10 NOTE — Assessment & Plan Note (Signed)
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

## 2011-01-15 ENCOUNTER — Ambulatory Visit (INDEPENDENT_AMBULATORY_CARE_PROVIDER_SITE_OTHER): Payer: Medicare Other | Admitting: Internal Medicine

## 2011-01-15 ENCOUNTER — Ambulatory Visit (INDEPENDENT_AMBULATORY_CARE_PROVIDER_SITE_OTHER): Payer: Medicare Other

## 2011-01-15 ENCOUNTER — Encounter: Payer: Self-pay | Admitting: Internal Medicine

## 2011-01-15 DIAGNOSIS — J301 Allergic rhinitis due to pollen: Secondary | ICD-10-CM

## 2011-01-15 DIAGNOSIS — J322 Chronic ethmoidal sinusitis: Secondary | ICD-10-CM

## 2011-01-15 DIAGNOSIS — J449 Chronic obstructive pulmonary disease, unspecified: Secondary | ICD-10-CM

## 2011-01-15 NOTE — Assessment & Plan Note (Signed)
Summary: ALLERGY/CB  Nurse Visit   Allergies: 1)  Penicillin 2)  Aspirin  Orders Added: 1)  Allergy Injection (1) [95115] 

## 2011-01-22 ENCOUNTER — Ambulatory Visit (INDEPENDENT_AMBULATORY_CARE_PROVIDER_SITE_OTHER): Payer: Medicare Other

## 2011-01-22 DIAGNOSIS — J301 Allergic rhinitis due to pollen: Secondary | ICD-10-CM

## 2011-01-22 NOTE — Assessment & Plan Note (Signed)
Summary: 2 month rov   Primary Provider/Referring Provider:   Sawyer  CC:  2 month follow up. Pt states breathing has improved since being on Advair 250/50.  Wheezing at times.  Ocass cough - prod with clear mucus.  .  History of Present Illness: November 13, 2010- Asthma/ COPD, allergic rhinitis, remote hx lung cancer Nurse-CC: 4 month followup allergies;, c/o sinus sore back of neck, occasional cough grayiish in am, wheeze quite a lot, sob with exertion CXR 07/2010- COPD, NAD since 01/2009 Says she still hurts, now described as inside her throat, around her neck, and also in left frontal area- these are variable and not at all clear these are related to each other. In particular tender to touch on mastoid areas superficially. . Reviewed smoking hx. Nasal discharge clear- Rx'd otc. Some wheeze if active Breathing comfortable once settled in bed. Using proventil several times daily and Advair two times a day.  Continues allergy vaccine w/o problem or concern.   January 15, 2011- Asthma/ COPD, allergic rhinitis, remote hx lung cancer Nurse-CC: 2 month follow up. Pt states breathing has improved since being on Advair 250/50.  Wheezing at times.  Ocas cough - prod with clear mucus.   CT head and neck showed ethmoid opacification, degenerative cervical spine with old fusion, atherosclerosis, emphysema.These were reviewed w/ her.  Allergy  vaccine GH 1:10- doing well without recognising seasonal pollen symptoms yet. Neti pot helps. Chest better- little cough and phlegm in the mornings. Face and neck pains reported last visit are now absent.. Meds reviewed- needs 90 day med script.      Asthma History    Asthma Control Assessment:    Age range: 12+ years    Symptoms: 0-2 days/week    Nighttime Awakenings: 0-2/month    Interferes w/ normal activity: no limitations    SABA use (not for EIB): 0-2 days/week    Asthma Control Assessment: Well Controlled   Preventive Screening-Counseling &  Management  Alcohol-Tobacco     Smoking Status: quit     Packs/Day: 1.0     Year Started: After [employer] Quit: 1990     Pack years: 40 years 2 packs daily  Current Medications (verified): 1)  Advair Diskus 250-50 Mcg/dose Aepb (Fluticasone-Salmeterol) .Marland Kitchen.. 1 Puff and Rinse, Twice Daily 2)  Metoprolol Succinate 50 Mg  Tb24 (Metoprolol Succinate) .... Take 1 Tablet By Mouth Once A Day 3)  Tramadol Hcl 50 Mg  Tabs (Tramadol Hcl) .... Take 1 Tablet By Mouth Three Times A Day As Needed 4)  Nasonex 50 Mcg/act  Susp (Mometasone Furoate) .Marland Kitchen.. 1-2 Sprays Each Nostril Daily 5)  Claritin-D 24 Hour 10-240 Mg  Tb24 (Loratadine-Pseudoephedrine) .... Take 1 Tablet By Mouth Once A Day 6)  Proventil Hfa 108 (90 Base) Mcg/act  Aers (Albuterol Sulfate) .... Use As Directed 7)  Allergy Vaccine Gh 1:10 8)  Caltrate 600+d 600-400 Mg-Unit  Tabs (Calcium Carbonate-Vitamin D) .... Take 1 Tablet By Mouth Two Times A Day 9)  Fish Oil 1000 Mg  Caps (Omega-3 Fatty Acids) .... Take 1 Tablet By Mouth Two Times A Day 10)  Diazepam 5 Mg  Tabs (Diazepam) .... Take 1/2 At Bedtime As Needed 11)  Mobic 15 Mg  Tabs (Meloxicam) .... Take 1/2 To 1 Tab By Mouth As Needed 12)  Acetaminophen Pm Extra Strength .... Take1 Tabs By Mouth At Bedtime 13)  Vitamin D 1.25mg  .... Take 1 Tab By Mouth Once A Month 14)  Pepcid 20 Mg  Tabs (Famotidine) .... Once Daily 15)  Vicodin 5-500 Mg Tabs (Hydrocodone-Acetaminophen) .... Take 1 Tab By Mouth At Bedtime As Needed For Pain  Allergies (verified): 1)  Penicillin 2)  Aspirin  Past History:  Past Medical History: Last updated: 11/13/2010 ASTHMA, CHRONIC OBSTRUCTIVE NOS (ICD-493.20) NEOP, MALIGNANT, BRONCHUS/LUNG NOS (ICD-162.9)-RULobec and chemotherapy.Stg 1B NSCCA/BAC RHINITIS, ALLERGIC NOS (ICD-477.9) Sinusitis- ethmoid 2012 Hx of ANGIOEDEMA (ICD-995.1)    Past Surgical History: Last updated: 03/08/2008 right upper lobectomy  Family History: Last updated:  07-16-2009 Asthma Mother- died Alzheimers, CVA age 92 Father- died MVA  Social History: Last updated: 11/09/2007 Patient states former smoker- 2 ppd x 50 yrs  Risk Factors: Smoking Status: quit (01/15/2011) Packs/Day: 1.0 (01/15/2011)  Review of Systems      See HPI       The patient complains of productive cough.  The patient denies shortness of breath with activity, shortness of breath at rest, non-productive cough, coughing up blood, chest pain, irregular heartbeats, acid heartburn, indigestion, loss of appetite, weight change, abdominal pain, difficulty swallowing, sore throat, tooth/dental problems, headaches, nasal congestion/difficulty breathing through nose, and sneezing.    Vital Signs:  Patient profile:   77 year old female Height:      63 inches Weight:      169.50 pounds BMI:     30.13 O2 Sat:      96 % on Room air Pulse rate:   72 / minute BP sitting:   136 / 72  (right arm) Cuff size:   regular  Vitals Entered By: Gweneth Dimitri RN (January 15, 2011 2:10 PM)  O2 Flow:  Room air CC: 2 month follow up. Pt states breathing has improved since being on Advair 250/50.  Wheezing at times.  Ocass cough - prod with clear mucus.   Comments Medications reviewed with patient Daytime contact number verified with patient. Gweneth Dimitri RN  January 15, 2011 2:11 PM    Physical Exam  Additional Exam:  General: A/Ox3; pleasant and cooperative, NAD, overweight, calm,  SKIN: no rash, lesions NODES: no lymphadenopathy HEENT: [place]/AT, EOM- WNL, Conjuctivae- clear, PERRLA, TM-Canals are clear-. , Nose- clear, Throat- clear and wnl, Mallampati  II NECK: Supple w/ fair ROM, JVD- none, normal carotid impulses w/o bruits Thyroid-  CHEST: Clear to P&A, no rales, wheeze, rhonchi or cough HEART: RRR, no m/g/r heard ABDOMEN: overweight IOE:VOJJ, nl pulses, no edema  NEURO: tremor hands and jaw, tense neck muscles      Impression & Recommendations:  Problem # 1:  ALLERGIC RHINITIS  DUE TO POLLEN (ICD-477.0)  Good control currently for this season as she continues vaccine. Risk benefit reminders. No change indicated   Problem # 2:  CHRONIC ETHMOIDAL SINUSITIS (ICD-473.2) Seen on CT head. Suggest best approach for now is just continued use of Neti pot.   Problem # 3:  ASTHMA, CHRONIC OBSTRUCTIVE NOS (ICD-493.20) Control is quite good nw. We discussed her Advair role again.   Medications Added to Medication List This Visit: 1)  Vicodin 5-500 Mg Tabs (Hydrocodone-acetaminophen) .... Take 1 tab by mouth at bedtime as needed for pain  Other Orders: Est. Patient Level III (00938)  Patient Instructions: 1)  Please schedule a follow-up appointment in 6 months. 2)  Refill scripts for naonex and for Advair Prescriptions: NASONEX 50 MCG/ACT  SUSP (MOMETASONE FUROATE) 1-2 sprays each nostril daily  #3 x 3   Entered and Authorized by:   Waymon Budge MD   Signed by:  Waymon Budge MD on 01/15/2011   Method used:   Print then Give to Patient   RxID:   [id number] ADVAIR DISKUS 250-50 MCG/DOSE AEPB (FLUTICASONE-SALMETEROL) 1 puff and rinse, twice daily  #3 x 3   Entered and Authorized by:   Waymon Budge MD   Signed by:   Waymon Budge MD on 01/15/2011   Method used:   Print then Give to Patient   RxID:   [id number]

## 2011-01-22 NOTE — Assessment & Plan Note (Signed)
Summary: ALLERGY/CB  Nurse Visit   Allergies: 1)  Penicillin 2)  Aspirin  Orders Added: 1)  Allergy Injection (1) [37106]

## 2011-01-29 ENCOUNTER — Ambulatory Visit (INDEPENDENT_AMBULATORY_CARE_PROVIDER_SITE_OTHER): Payer: Medicare Other

## 2011-01-29 DIAGNOSIS — J301 Allergic rhinitis due to pollen: Secondary | ICD-10-CM

## 2011-02-05 ENCOUNTER — Ambulatory Visit (INDEPENDENT_AMBULATORY_CARE_PROVIDER_SITE_OTHER): Payer: Medicare Other

## 2011-02-05 DIAGNOSIS — J301 Allergic rhinitis due to pollen: Secondary | ICD-10-CM

## 2011-02-12 ENCOUNTER — Ambulatory Visit (INDEPENDENT_AMBULATORY_CARE_PROVIDER_SITE_OTHER): Payer: Medicare Other

## 2011-02-12 DIAGNOSIS — J301 Allergic rhinitis due to pollen: Secondary | ICD-10-CM

## 2011-02-19 ENCOUNTER — Ambulatory Visit (INDEPENDENT_AMBULATORY_CARE_PROVIDER_SITE_OTHER): Payer: Medicare Other

## 2011-02-19 DIAGNOSIS — J309 Allergic rhinitis, unspecified: Secondary | ICD-10-CM

## 2011-02-26 ENCOUNTER — Ambulatory Visit (INDEPENDENT_AMBULATORY_CARE_PROVIDER_SITE_OTHER): Payer: Medicare Other

## 2011-02-26 DIAGNOSIS — J309 Allergic rhinitis, unspecified: Secondary | ICD-10-CM

## 2011-03-04 ENCOUNTER — Ambulatory Visit (INDEPENDENT_AMBULATORY_CARE_PROVIDER_SITE_OTHER): Payer: Medicare Other

## 2011-03-04 DIAGNOSIS — J309 Allergic rhinitis, unspecified: Secondary | ICD-10-CM

## 2011-03-13 ENCOUNTER — Ambulatory Visit (INDEPENDENT_AMBULATORY_CARE_PROVIDER_SITE_OTHER): Payer: Medicare Other

## 2011-03-13 DIAGNOSIS — J309 Allergic rhinitis, unspecified: Secondary | ICD-10-CM

## 2011-03-19 ENCOUNTER — Ambulatory Visit (INDEPENDENT_AMBULATORY_CARE_PROVIDER_SITE_OTHER): Payer: Medicare Other

## 2011-03-19 DIAGNOSIS — J309 Allergic rhinitis, unspecified: Secondary | ICD-10-CM

## 2011-03-19 NOTE — Assessment & Plan Note (Signed)
[facility]                             PULMONARY OFFICE NOTE   [name], [name]                     MRN:          [id number]  DATE:06/15/2007                            DOB:          Nov 17, 1926    PROBLEM:  1. Asthma with chronic obstructive pulmonary disease.  2. Allergic rhinitis.  3. Angioedema.  4. Stage 1B non-small cell lung cancer, bronchoalveolar, status post      right upper lobectomy/chemotherapy.   HISTORY:  She understands that her situation is stable without evident  recurrence of her lung cancer. Her main complaint now is that her eyes  itch. She has not been using Nasonex regularly and asks about new  antihistamines having heard particularly about Xyzal. She cannot point  to a specific exposure and I am not sure if she even recognizes a  seasonal pattern. We explored the possibility of over drying. Vision has  not been blurred.   MEDICATIONS:  1. Advair 100/50.  2. Toprol XL.  3. Tramadol t.i.d.  4. Occasional use of Nasonex.   DRUG INTOLERANCES:  PENICILLIN AND ASPIRIN.   OBJECTIVE:  Weight 171 pounds, blood pressure 122/64, pulse 67. Room air  saturation 96%. Frequent blinking. There is no conjunctival injection. I  cannot tell if secretions are reduced. Nasal airway looks unremarkable.  CHEST: Clear.  Pulse regular.   IMPRESSION:  1. Mild asthma/chronic obstructive pulmonary disease based on previous      PFTs.  2. Status post right upper lobectomy.  3. Question allergic conjunctivitis/allergic rhinitis versus over      drying.   PLAN:  1. Samples Xyzal 5 mg daily p.r.n. with discussion of the drying      effect of this medication.  2. Samples of Veramyst nasal spray anticipating we might be able to      gently reduce conjunctival inflammation as this medication      migrates.   She will let me know if she continues having problems, but is also  encouraged to see her eye doctor. Schedule return in one year,  earlier  p.r.n.     Clinton D. Maple Hudson, MD, Tonny Bollman, FACP  Electronically Signed    CDY/MedQ  DD: 06/15/2007  DT: 06/16/2007  Job #: [id number]   cc:   Lajuana Matte, MD  Georgann Housekeeper, MD

## 2011-03-19 NOTE — Assessment & Plan Note (Signed)
 [facility]                             PULMONARY OFFICE NOTE   [name] [name], [name] [name]                     MRN:          [id number]  DATE:09/09/2007                            DOB:          1926/12/16    PROBLEM:  1. Asthma with chronic obstructive pulmonary disease.  2. Allergic rhinitis.  3. Angioedema.  4. Stage 1B non-small cell lung cancer, bronchoalveolar, status post      upper lobectomy/chemotherapy.   HISTORY:  She returns today off of antihistamines for skin testing. She  says that her eyes and throat are bothering her with nasal congestion,  post-nasal drip. She does not feel that she has a cold. She has had flu  shot.   OBJECTIVE:  Weight 178 pounds, blood pressure 110/68, pulse 69, room air  saturation 95%. Clear chest, watery eyes, minimal nasal congestion.   MEDICATIONS:  Her medication list is reviewed and charted.   SKIN TEST:  Positive for common grass, weed, and tree pollens, dust and  dust mite, and some molds. This was compared with previous test results  done in the 1980s and 1990s, and most recently in 2002 with similar  pattern. She wants to restart allergy vaccine convinced that she felt  better with that therapy. Risks, goals, logistics, and realistic  expectations were reviewed and questions were answered. Environmental  precautions were reviewed.   IMPRESSION:  Allergic rhinitis, allergic conjunctivitis, remote history  of lung cancer.   PLAN:  We will start allergy vaccine based on current testing. Schedule  return 2 months, earlier p.r.n.     Clinton D. Maple Hudson, MD, Tonny Bollman, FACP  Electronically Signed    CDY/MedQ  DD: 09/12/2007  DT: 09/13/2007  Job #: [id number]   cc:   Lajuana Matte, MD  Georgann Housekeeper, MD

## 2011-03-19 NOTE — Assessment & Plan Note (Signed)
[facility]                             PULMONARY OFFICE NOTE   [name], [name]                     MRN:          [id number]  DATE:08/20/2007                            DOB:          1927/10/18    PROBLEMS:  1. Asthma with chronic obstructive pulmonary disease.  2. Allergic rhinitis.  3. Angioedema.  4. Stage IB non-small-cell lung cancer, bronchioalveolar, status post      right upper lobectomy/chemotherapy.   HISTORY:  She had come off of her allergy vaccine in 2006 during  treatment for her lung cancer but comes now asking to restart.  Complains of persistent sneezing, postnasal drainage, eyes itching and  watering.  She is worse in rainy weather and when lawns are being mowed.  She has been taking Claritin and says it works better than most others  tried, although she prefers Medical illustrator, which is not covered by her  insurance.  She has not found Zyrtec very helpful.  She needs refill on  Nasonex.  She has not been wheezing, and there has been nothing purulent  or bloody.   MEDICATIONS:  1. Advair 100/50.  2. Toprol XL.  3. Nasonex.  4. Claritin.   DRUG INTOLERANCES:  PENICILLIN, ASPIRIN.   OBJECTIVE:  Weight 173 pounds.  BP 126/72, pulse 74.  Room air  saturation 96%.  Mild hoarseness.  Eyes are somewhat watery but secretions are clear.  Conjunctivae are not injected.  Nasal turbinates are edematous without  visible polyps or significant mucus.  Pharynx is clear.  Voice quality  slightly hoarse.  There is no stridor.  LUNGS:  Quiet and clear without cough or wheeze.  Heart sounds are regular without murmur.  I find no adenopathy.   IMPRESSION:  1. History of lung cancer.  2. Rhinitis with significant allergic component.  3. Asthma/chronic obstructive pulmonary disease, controlled.   PLAN:  1. Depo-Medrol 40 mg IM.  2. Samples of Xyzal 5 mg 1 daily for comparison.  3. Refill Nasonex.  4. Schedule return for allergy skin  testing, as she requests, before      considering reinstitution of allergy vaccine therapy.     Clinton D. Maple Hudson, MD, Tonny Bollman, FACP  Electronically Signed    CDY/MedQ  DD: 08/20/2007  DT: 08/21/2007  Job #: [id number]   cc:   Lajuana Matte, MD  Georgann Housekeeper, MD

## 2011-03-22 NOTE — Op Note (Signed)
[name] [name], [name] [name]              ACCOUNT NO.:  [id number]   MEDICAL RECORD NO.:  [id number]          PATIENT TYPE:  INP   LOCATION:  2899                         FACILITY:  [facility]   PHYSICIAN:  Ines Bloomer, M.D. DATE OF BIRTH:  06-03-1927   DATE OF PROCEDURE:  DATE OF DISCHARGE:                                 OPERATIVE REPORT   PREOPERATIVE DIAGNOSIS:  Right upper lobe mass.   POSTOPERATIVE DIAGNOSIS:  Adenocarcinoma right upper lobe.   OPERATION:  Right VATS, right thoracotomy, right upper lobectomy with  mediastinal node dissection.   SURGEON:  Ines Bloomer, M.D.   FIRST ASSISTANT:  __________ RNFA.   After percutaneous insertion of all monitoring lines, the patient underwent  general anesthesia, was turned to the right lateral thoracotomy position,  was prepped and draped in the usual sterile manner.  Two trocar sites were  made in the anterior and posterior axillary line at the seventh intercostal  space.  Two trocars were inserted.  A 30  degree scope was inserted and  there were marked adhesions of the upper lobe to the chest wall since it was  decided just to go ahead and do a open thoracotomy rather than the VATS  approach.  A posterolateral thoracotomy was made over the fifth intercostal  space.  The latissimus was partially divided.  The serratus was reflected  anteriorly.  Fifth intercostal space was entered.  A portion of the sixth  rib was taken subperiosteally at the angle.  Two Tuffiers were placed at  right angles.  The adhesions were taken down with electrocautery bringing up  the superior segment of the right lower lobe, the right upper lobe and the  right middle lobe.  After all the adhesions had been taken down, the  inferior pulmonary ligament was taken down with electrocautery.  The cancer  was in the posterior segment of the right upper lobe.  Dissection was  started in the anterior hilar areas, dissecting out the apical posterior  branch of  the right upper lobe and stapled it and divided with the  autosuture 30 wide reticular. Several 10R nodes were dissected free from  around the branches.  Then the superior pulmonary vein was dissected out to  the right upper lobe, stapled and divided with autosuture stapler, again  removing several 10R nodes.  A small anterior branch was then also stapled  with autosuture stapler and exposed the posterior branch of the right upper  lobe which was much larger.  Prior to dividing this, the superior portion of  the fissure was divided with autosuture stapler and several 11R nodes were  dissected free from around the bronchus. This exposed the right upper lobe  bronchus which was stapled with the TL-30 stapler and divided distally.  Finally, the posterior branch to the right upper lobe was stapled and  divided with an autosuture 30 wide roticulator.  Last, the bladder fissure  was divided with two applications of the Echelon 60 stapler and one  application of the 45 autosuture stapler.  The right upper lobe was removed.  Frozen section revealed non-small-cell  lung cancer.  The bronchial margins  were negative.  An intercostal nerve block was done in the usual fashion.  Two chest tubes were brought in through the trocar sites and tied in place  with 0 silk.  CoSeal was applied to the staple line.  An on-cue catheter was  tunneled subpleurally in the paravertebral space through a sheath with the  sheath being tunneled in the subpleural space and the tunneler was removed  and the catheter was placed through the sheath and the sheath was removed.  The catheter was held in place with Steri-Strips.  The right middle lobe was  sutured to the right lower lobe to prevent torsion with two interrupted 2-0  silks.  The chest was closed with four  pericostals.  The lung was re-expanded and #1 Vicryl in the muscle layer and  2-0 Vicryl in the subcutaneous tissue and 3-0 Vicryl as a subcuticular  stitch.   Dermabond for the skin.  The patient tolerated the procedure well  and was returned to the recovery room in stable condition.       DPB/MEDQ  D:  05/09/2005  T:  05/09/2005  Job:  [id number]   cc:   Joni Fears D. Maple Hudson, M.D.

## 2011-03-22 NOTE — H&P (Signed)
NAMEABIGAEL, [name] [name]              ACCOUNT NO.:  [id number]   MEDICAL RECORD NO.:  [id number]          PATIENT TYPE:  INP   LOCATION:  NA                           FACILITY:  [facility]   PHYSICIAN:  Ines Bloomer, M.D. DATE OF BIRTH:  1926/11/14   DATE OF ADMISSION:  05/07/2005  DATE OF DISCHARGE:                                HISTORY & PHYSICAL   CHIEF COMPLAINT:  Right lung lesion.   HISTORY OF PRESENT ILLNESS:  This is a former smoker who has been followed  by Dr. __________ for allergies and was seen by Joni Fears D. Young, M.D. with  a follow-up chest x-ray that showed a right upper lobe lesion.  CT scan was  done and showed right upper lobe lesion with no adenopathy.  PET scan also  showed the lesion was positive with no adenopathy.  Pulmonary function tests  showed an FVC of 1.80 with an FEV1 of 1.21 and her lung volume was 70% of  predicted.  Needle biopsy revealed a nonsmall cell adenocarcinoma.  Her  brother was treated for cancer that I had seen in the past.  She has had no  hemoptysis.  She has been treated for asthma and bronchitis and gets  shortness of breath with exertion.   PAST MEDICAL HISTORY:  Significant for hypercholesterolemia and emphysema.   MEDICATIONS:  She takes Toprol XL, Prevacid, Advair, Nasonex, Effexor,  Diovan, and albuterol.   FAMILY HISTORY:  Negative for vascular disease, but positive for cancer.   SOCIAL HISTORY:  She is single and has two children.  She quit smoking in  1995.  Does not drink alcohol on a regular basis.   REVIEW OF SYSTEMS:  She is 168 pounds, she is 5 feet 3 inches.  She had some  recent weight gain.  CARDIAC:  No history of angina or atrial fibrillation.  PULMONARY:  Noted in history of present illness.  GASTROINTESTINAL:  She has  a hiatal hernia.  No nausea, vomiting, or constipation.  GENITOURINARY:  No  dysuria or frequent urinations.  VASCULAR:  No claudication, TIA's, or DVT.  NEUROLOGY:  No headaches or blackouts.   ORTHOPEDICS:  She has some chronic  joint and muscular pain and has been told she has arthritis.  PSYCHIATRIC:  She has been treated for situational depression.  EYES:  No change in her  eyesight or hearing.  HEMATOLOGIC:  No history of anemia.   PHYSICAL EXAMINATION:  GENERAL:  She is a slightly obese Caucasian female in  no acute distress.  VITAL SIGNS:  Blood pressure 158/88, pulse 66, respirations 18, and  saturations were 94%.  HEENT:  Head is atraumatic.  Eyes; pupils equal, round, and reactive to  light and accommodation.  Extraocular movements are normal.  Nares and  tympanic membranes intact.  Nares, there is no septal deviation.  Throat;  uvula is in the midline.  Tongue is in the midline.  There is no lesion.  NECK:  There is no supraclavicular or axillary adenopathy.  No carotid  bruits.  No thyromegaly.  CHEST:  Clear to auscultation and percussion.  Do  not hear any wheezes.  HEART:  Regular sinus rhythm with no murmurs.  ABDOMEN:  Soft, no hepatosplenomegaly.  Bowel sounds are 2+.  EXTREMITIES:  Pulses are 2+, there is no clubbing or edema.  NEUROLOGY:  She is oriented x3.  Sensory and motor intact.  Cranial nerves  II-XII grossly intact.  Deep tendon reflexes are 1+.  SKIN:  Without lesions.   IMPRESSION:  1.  Adenocarcinoma right upper lobe.  2.  Chronic obstructive pulmonary disease.  3.  Hypercholesterolemia.   PLAN:  Right VATS, possible wedge resection of right upper lobe versus right  upper lobectomy.       DPB/MEDQ  D:  05/07/2005  T:  05/07/2005  Job:  [id number]

## 2011-03-22 NOTE — Discharge Summary (Signed)
NAMESHATIRA, DOBOSZ              ACCOUNT NO.:  [id number]   MEDICAL RECORD NO.:  [id number]          PATIENT TYPE:  ORB   LOCATION:  4532                         FACILITY:  [facility]   PHYSICIAN:  Jerold Coombe, P.A.DATE OF BIRTH:  15-Jun-1927   DATE OF ADMISSION:  05/21/2005  DATE OF DISCHARGE:                                 DISCHARGE SUMMARY   ANTICIPATED DATE OF DISCHARGE:  May 28, 2005.   ADMISSION DIAGNOSES:  1.  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  2.  C. difficile colitis with leukocytosis.   ADDITIONAL DIAGNOSES:  1.  Postoperative hyponatremia, resolved.  2.  Postoperative increased liver function tests of uncertain etiology,      improving.  3.  Postoperative mild confusion with morphine, resolved.  4.  Postoperative mild hypokalemia, resolved.  5.  Postoperative right pneumothorax requiring chest tube insertion,      resolved.  6.  Chronic obstructive pulmonary disease with a history of tobacco abuse      but having quit approximately 12 years ago.  7.  History of urinary incontinence.  8.  History of cataract extraction, both eyes.  9.  History of right rotator cuff repair.  10. History of hysterectomy.  11. History of left arm open reduction and internal fixation.  12. History of surgical repair of right index finger.  13. History of lumbar fusion.  14. History of cholecystectomy.  15. History of right chest tube in 1959.  16. Allergies to PENICILLIN, which causes welts, and INTOLERANCE to ASPIRIN.  17. History of hypertension.  18. History of hypercholesterolemia.   BRIEF HISTORY:  Ms. Ranker is a 77 year old Caucasian female, who is a  former smoker, with history of COPD and is followed by Dr. Jetty Duhamel.  A  recent chest x-ray showed a right upper lobe lung lesion.  CT scan was  performed and confirmed a  right upper lobe lesion with no adenopathy.  PET  scan also showed the lesion was positive with no adenopathy.  Pulmonary  function tests showed an FVC of 1.80 with FEV1 of 1.21, and her lung volume  was 70% of predicted.  Needle biopsy revealed non-small-cell adenocarcinoma.  Family history was significant for a brother, who had been treated for  cancer in the past.  She denied any hemoptysis but did report shortness of  breath with exertion.  Ultimately, she was referred to Dr. Algis Downs. Karle Plumber  and she was admitted to [facility] on May 09, 2005, to undergo  right video-assisted thoracoscopic surgery with right thoracotomy and right  upper lobectomy with mediastinal lymph node dissection on May 09, 2005.  Pathology did show stage I-B non-small-cell lung carcinoma (stage 2 N-0-M-0)  bronchoalveolar adenocarcinoma of the right upper lobe.  Subsequently, she  was seen in consultation by Dr. Si Gaul, who felt she would need  adjuvant chemotherapy to begin approximately four to six weeks after  surgery.  Once discharged, she is to  see him in two to three weeks to  discuss further details.  Postoperatively, Ms. Parisi went to Unit 3300, a  step-down unit.  She remained there until her transfer to Subacute Unit on  May 21, 2005.  Her hospital course was first complicated by a 60-70% right  pneumothorax following chest tube removal that did require reinsertion of a  right chest tube.  Her hospital course was further complicated by a  leukocytosis with a white blood count of greater than 31,000.  Workup  ultimately showed her stool was positive for C. difficile toxin.  Despite  treatment with Flagyl, diarrhea persisted and Gastroenterology consult was  requested, and she was seen by Dr. Roosvelt Harps and later by his  associate, Dr. Bernette Redbird.  Oral Vancomycin was also added to her  regimen.  Over time, her diarrhea and leukocytosis began to improve.  Other  more minor  issues that were addressed postoperatively were mild hyponatremia  and hypokalemia, which both resolved with supplementation.  She also had  mild confusion in the immediate postoperative phase on her morphine PCA,  which also resolved.  Laboratory tests also showed elevated liver function  tests with an SGOT of 180 and SGPT of 320.  Of note, her preoperative liver  function tests were normal.  Over time, her liver function tests did appear  to be normalizing.  She was also started on oral Avalox in the immediate  postoperative phase for respiratory cough and congestion as well as  nebulizer treatments and Humibid and progressive pulmonary toilet.  She was  eventually successfully from supplemental oxygen.  Her right chest tube was  also finally discontinued on May 20, 2005.  In the meantime, she had been  evaluated by Physical Therapy, who felt she would benefit from a short stay  in the Subacute Care Unit.  Subsequently, Rehab/Subacute Care consult was  requested and she was felt appropriate.  She was discharged to [facility] Subacute Care Unit on May 21, 2005, with continued C. difficile  regimen of Vancomycin and Flagyl.   HOSPITAL COURSE:  On May 21, 2005, Ms. Caracci was discharged from [facility] Acute Care and admitted to [facility] Subacute Care.  Ms. Sulewski's stay in subacute care was relatively uneventful.  She  continued to make progress with the physical therapy.  Again, she was weaned  from supplemental oxygen but was noted to desat to the mid to high 80s with  mobilization but was otherwise above 90%.  Her incisions continued to heal  well without signs of infection.  Her leukocytosis and diarrhea continued to  improve, with last white count at 11.6 thousand.  From a pulmonary  standpoint, she also continued to improve, with her last chest x-ray on July 24th showing mild atelectasis or scarring in the bilateral bases with small  right pleural  effusion, which appeared stable.  There was no evidence of  right pneumothorax.  She remained afebrile with stable vital signs, with the  last vitals at the time of dictation showing a blood pressure of 120/60,  heart rate at 100, and oxygen saturation 90 to 92% on room air.  In regards  to other labs, her hemoglobin and hematocrit were stable at 9.7 and 29.7,  respectively.  Platelet count was elevated at 888, which had remained stable  and overall trending down from her admission to Subacute Care.  She had had  a previous respiratory culture on July 14th, which showed  normal  oropharyngeal flora.  Her last electrolytes showed a sodium of 137,  potassium 3.7, which was supplemented, chloride 97, CO2 32, blood glucose  104, BUN 8, creatinine 0.8, and calcium 8.2.  Her last liver function tests  showed a normal total bilirubin of 0.3, alkaline phosphatase 109, a slightly  SGOT of 53 but improving, normal SGPT of 39, her total protein was decreased  at 4.5, and blood albumin decreased to 1.9.  a previous central line  catheter tip, which had been sent for culture when she initially presented  with leukocytosis, showed no growth.   Since Ms. Frenette's chest x-ray remained stable and she appears to be  continuing to progress from a pulmonary standpoint, her C. difficile colitis  is clinically improving, and she is making good progress with physical  therapy, it is anticipated that she will be ready for discharge home on  Tuesday, May 28, 2005.  Official orders to be written during morning rounds  pending no significant change in her status.   DISCHARGE MEDICATIONS:  1.  Percocet 5/325 mg 1-2 tablets p.o. q.4h p.r.n. pain.  2.  Vancomycin 125 mg p.o. q.i.d. x1 week, then t.i.d. x1 week, then b.i.d.      x1 week, then daily x2 weeks, then every other day x2 weeks, then      discontinue.  A prescription with written instructions were specifically      provided to the patient by Dr. Matthias Hughs.   3.  She is also to continue her home regimen of Toprol-XL, diazepam,      fexofenadine, Advair Discus, Albuterol inhaler, and Nasonex.  She is      unsure of these dosages, but we believe that she takes:      1.  Toprol-XL 50 mg daily.      2.  Diazepam 5 mg one-half tablet q.h.s.      3.  Fexofenadine as needed.      4.  Advair Discus 250/50 mcg one dose inhaled b.i.d.      5.  Albuterol inhaler two puffs q.4h p.r.n. shortness of breath or          wheezing.      6.  Nasonex two sprays in each nostril daily p.r.n.   DISCHARGE INSTRUCTIONS:  She is to follow a low salt, low fat diet.  She is  to avoid heavy lifting and driving x2 weeks.  She is to continue daily  walking exercises and was encouraged to continue daily breathing exercises.  She may shower and clean her incisions gently with mild soap and water but  should call if she develops fever greater than 101, redness or drainage from her incision site, or increasing shortness of breath.   FOLLOWUP:  1.  She is to follow up with Dr. Edwyna Shell at the [facility] office on Wednesday,      June 05, 2005, at 4:10 p.m.  She is to have a chest x-ray one hour      before at [facility] and was instructed to bring her chest x-ray      with her to the [facility] office.  2.  She is to follow up with Dr. Si Gaul in approximately two to      three weeks.  She is to call [phone] to confirm an appointment.  3.  Dr. Bernette Redbird - she is to call [phone] to schedule a followup      appointment as needed.  AWZ/MEDQ  D:  05/27/2005  T:  05/27/2005  Job:  [id number]   cc:   Bernette Redbird, M.D.  [address] [place]  [place], [place] [address]  Fax: [phone]   Lajuana Matte, MD  Fax: [phone]   Georgann Housekeeper, MD  [address]  [place]  [place] [address]  Fax: [phone]   Clinton D. Maple Hudson, M.D.

## 2011-03-22 NOTE — Op Note (Signed)
NAME:  [name], [name] [name]                        ACCOUNT NO.:  [id number]   MEDICAL RECORD NO.:  [id number]                   PATIENT TYPE:  AMB   LOCATION:  ENDO                                 FACILITY:  [facility]   PHYSICIAN:  Danise Edge, M.D.                DATE OF BIRTH:  Dec 09, 1926   DATE OF PROCEDURE:  10/13/2003  DATE OF DISCHARGE:                                 OPERATIVE REPORT   INDICATIONS FOR PROCEDURE:  The patient is a 77 year old female born  1927-04-11.  The patient has chronic gastroesophageal reflux.  She  takes Prevacid and denies heartburn, dysphagia, or odynophagia.  She does  have abdominal bloating.  An esophagogastroduodenoscopy is scheduled to rule  out Barrett's esophagus and erosive esophagitis.  The patient is also  scheduled to undergo her first screening colonoscopy with polypectomy to  prevent colon cancer.   ENDOSCOPIST:  Danise Edge, M.D.   PREMEDICATION:  Versed 7 mg, Demerol 70 mg.   DESCRIPTION OF PROCEDURE:  Esophagogastroduodenoscopy.  After obtaining  informed consent, the patient was placed in the left lateral decubitus  position.  I administered intravenous Demerol and intravenous Versed to  achieve conscious sedation for the procedure.  The patient's blood pressure,  oxygen saturation, and cardiac rhythm were monitoring throughout the  procedure and documented in the medical record.   The Olympus gastroscope was passed through the posterior pharynx and into  the proximal esophagus without difficulty.  The hypopharynx and larynx  appeared normal.  I did not adequately visualize the vocal cords.   Esophagoscopy.  The proximal, mid, and lower segments of the esophageal  mucosa appear normal.  The squamocolumnar junction and esophagogastric  junction are noted at 37 cm from the incisor teeth.  There is no endoscopic  evidence for the presence of erosive esophagitis, Barrett's esophagus, or  esophageal mucosal scarring.   Gastroscopy.  A retroflexed view of the gastric cardia and fundus was  normal.  The gastric body, antrum, and pyloris appear normal.   Duodenoscopy.  The duodenal bulb and descending duodenum appear normal.   ASSESSMENT:  Normal esophagogastroduodenoscopy.   DESCRIPTION OF PROCEDURE:  Screening proctocolonoscopy.  Anal inspection was  normal.  Digital rectal examination was normal.  The Olympus adjustable  pediatric colonoscope was introduced into the rectum and advanced to the  cecum.  Colonic preparation for the examination today was excellent.   Rectum normal.   Sigmoid colon and descending colon.  At approximately 35 cm from the anal  verge, a 1 mm sessile polyp was removed with the electrocautery snare and a  5 mm sessile polyp was removed with the electrocautery snare in piecemeal  fashion.   Splenic flexure normal.   Transverse colon normal.   Hepatic flexure normal.   Ascending colon normal.   Cecum and ileocecal valve normal.   ASSESSMENT:  A 5 mm sessile polyp and a 1 mm sessile polyp  were removed from  the sigmoid colon.  All pathology specimens were not retrieved, but the  largest specimen was retrieved for pathologic evaluation.                                               Danise Edge, M.D.    MJ/MEDQ  D:  10/13/2003  T:  10/13/2003  Job:  [id number]   cc:   Georgann Housekeeper, M.D.  [address]  [place]  [place] [address]  Fax: [phone]

## 2011-03-22 NOTE — Consult Note (Signed)
[name], [name] NO.:  [id number]   MEDICAL RECORD NO.:  [id number]          PATIENT TYPE:  INP   LOCATION:  3303                         FACILITY:  [facility]   PHYSICIAN:  Lajuana Matte, MD  DATE OF BIRTH:  18-Dec-1926   DATE OF CONSULTATION:  DATE OF DISCHARGE:                                   CONSULTATION   REFERRING PHYSICIAN:  Ines Bloomer, M.D.   REASON FOR CONSULTATION:  A 77 year old white female diagnosed with non-  small cell lung cancer for consideration of adjuvant chemotherapy.   HISTORY:  [name] [name] is a very pleasant 77 year old white female, a former  smoker, who was seen by Dr. Jetty Duhamel in May, 2005 for routine  evaluation and mentioned to him that her brother was recently diagnosed with  lung cancer.  She had a chest x-ray performed at that time which showed  right lung abnormalities.  This was followed by a CT scan of the chest,  which was performed on Mar 06, 2005, and it showed a 4.3 x 2.4 cm spiculated  mass in the right upper lobe, questionable for malignancy.  There was also a  carinal lymphadenopathy and a questionable second faint nodular opacity in  the right upper lobe.   On April 09, 2005, the patient underwent CT-guided biopsy of the right upper  lobe mass, and the pathology revealed malignant cells consistent with  adenocarcinoma.  The patient also had a PET scan performed on April 17, 2005,  and it showed markedly abnormal FDG  activity corresponding to the mass in  the posterior portion of the right upper lobe.  There was no other abnormal  activity in the chest, abdomen, or bases.  The patient also had a CT scan of  the head performed on April 24, 2005, and it was negative for any metastasis.  The patient was referred to Dr. Edwyna Shell and on May 09, 2005, she underwent a  right upper lobectomy with mediastinal node dissection.  The pathology  revealed a 4.5 cm bronchoalveolar adenocarcinoma with focal involvement of  the visceral pleura  at the apex.  There was no evidence of lymphovascular  invasion, and all of the dissected lymph nodes from N1 and N2 were negative  for malignancy.   Dr. Edwyna Shell kindly asked me to evaluate the patient today for further  treatment options and adjuvant chemotherapy is recommended.  When seen  today, the patient recovered very well from her surgery.  She continued to  have mild right-sided chest discomfort but otherwise, she is feeling fine.   REVIEW OF SYSTEMS:  She has no fever, chills, headache, blurring of vision,  double vision.  No night sweats or weight loss.  No chest pain except as  mentioned above.  She continued to have baseline shortness of breath.  No  cough, hemoptysis, syncope, or palpitations.  No nausea, vomiting, abdominal  pain, diarrhea, constipation, melena or hematochezia.  No dysuria,  hematuria, urgency, or increased frequency.   PAST MEDICAL HISTORY:  Significant only for hypercholesterolemia and  emphysema as well as degenerative joint disease.  The  patient denied having  any history of hypertension, diabetes mellitus, coronary artery disease, or  stroke.   FAMILY HISTORY:  Her brother was recently diagnosed with lung cancer.  There  is no other significant family history of coronary artery disease, stroke,  or diabetes mellitus.   SOCIAL HISTORY:  She is divorced.  She has two children.  She used to work  in a tobacco company.  She has a history of smoking one pack per day for  over 50 years.  Quit in 1995.  Denied having any history of alcohol or drug  abuse.   ALLERGIES:  She is allergic to PENICILLIN and ASPIRIN.   HOME MEDICATIONS:  Toprol XL, Prevacid, Advair, Nasonex, Effexor, Diovan,  and albuterol.   PHYSICAL EXAMINATION:  VITAL SIGNS:  Blood pressure 120/40, pulse 82,  respiratory rate 16, temperature 98.3, oxygen saturation 96% on 2 liters.  GENERAL:  A very pleasant 77 year old white female awake and alert in no  acute  distress.  HEENT:  Normocephalic and atraumatic.  Clear oropharynx.  NECK:  Supple.  No lymphadenopathy.  LUNGS:  A few crackles at the right lung base.  CARDIOVASCULAR:  Normal S1 and S2.  Regular rate and rhythm.  No murmurs,  rubs or gallops.  ABDOMEN:  Soft, nontender, nondistended.  No masses.  EXTREMITIES:  No edema.   LABORATORY DATA:  White blood count 8.5, hemoglobin 10.3, hematocrit 30.3,  platelets 294.  Sodium 134, potassium 3.8, BUN 7, creatinine 0.6, calcium  8.5, glucose 107.   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  I had a long discussion with the  patient about her disease, prognosis, and treatment options.  I explained to  the patient that according to subset analysis from the EAVWU-9811 clinical  trial for adjuvant chemotherapy and stage IB non-small-cell lung cancer, it  showed the patient with tumor size more than 4 cm.  Did have survival  benefit from treatment with four cycles of adjuvant chemotherapy in the form  of carboplatin and paclitaxel to be given 4-6 weeks after surgery.  I also  discussed the option of observation and monitoring with the patient.  She  will have a follow-up appointment with me at the [facility] in 2-  3 weeks after her discharge for more detailed discussion of the adjuvant  chemotherapy versus observation.   Thank you so much for allowing me to participate in the care of [name] [name].       MKM/MEDQ  D:  05/13/2005  T:  05/13/2005  Job:  [id number]   cc:   Ines Bloomer, M.D.  [address]  [place]  [place] [address]   [name] [name], M.D.

## 2011-03-22 NOTE — Consult Note (Signed)
[name] [name], [name] [name]              ACCOUNT NO.:  [id number]   MEDICAL RECORD NO.:  [id number]          PATIENT TYPE:  INP   LOCATION:  3303                         FACILITY:  [facility]   PHYSICIAN:  Althea Grimmer. Santogade, M.D.DATE OF BIRTH:  1927-05-16   DATE OF CONSULTATION:  DATE OF DISCHARGE:                                   CONSULTATION   DATE OF GASTROENTEROLOGY CONSULTATION:  May 19, 2005.   HISTORY OF PRESENT ILLNESS:  Ms. Swetz is a 77 year old female, who is 10  days postop from a right upper lobectomy for adenocarcinoma of the lung.  Postoperatively, she received Avalox.  She developed diarrhea several days  ago and is noted to be C.difficile toxin titer positive.  She was started on  p.o. Flagyl two days ago.  Symptoms have not really improved, and her white  blood count has steadily increased and is currently 31,200.  She is having  loose, non-bloody diarrhea two to five times per day and complains of  stinging, diffuse, abdominal discomfort.  She denies any other chronic  gastrointestinal problems except for reflux.  She had been taking Prevacid  as an outpatient.   PAST MEDICAL HISTORY:  1.  Hypercholesterolemia.  2.  COPD.  3.  Adenocarcinoma of the right lung.   FAMILY HISTORY:  Noncontributory.   SOCIAL HISTORY:  Quit smoking in 1995.  Minimal alcohol intake.  She is  single with two adult children.   PHYSICAL EXAMINATION:  GENERAL:  She is an elderly female in no acute  distress but seeming somewhat weak.  VITAL SIGNS:  Afebrile.  Blood pressure 120/40.  Pulse 76.  SKIN:  Normal.  HEENT:  Eyes anicteric.  Oropharynx unremarkable.  CHEST:  The chest sounds clear anteriorly.  HEART:  Regular rate and rhythm.  ABDOMEN:  Minimally distended.  Bowel sounds are active.  There is diffuse,  mild tenderness to deep palpation.  There are no masses.  RECTAL:  Exam not performed.  EXTREMITIES:  Trace edema.   LABORATORY DATA:  Hemoglobin 9.5, white blood count 31.2,  BUN 16,  C.difficile toxin titer positive.  Abdominal x-ray, nonspecific pattern with  no colonic distention.   IMPRESSION:  C.difficile-related colitis and diarrhea subsequent to  fluoroquinolone use.  The latter class of antibiotics is now the class that  is most commonly associated with in-hospital development of C.difficile.   PLAN:  Avoid use of all other antibiotics.  I am adding Vancomycin and will  follow the patient clinically with CBCs and assessment of diarrhea.  Further  recommendations will follow in the next day or two if her symptoms do  not respond.  She should receive a full 14-day course of antibiotics, and if  there is any sign of recurrence of diarrhea she needs to be treated again  immediately and then placed on a prolonged taper for which directions will  be forthcoming.       PJS/MEDQ  D:  05/19/2005  T:  05/19/2005  Job:  [id number]   cc:   Bernette Redbird, M.D.  [address] [place]  [place], [place] [address]  Fax: [phone]

## 2011-03-26 ENCOUNTER — Ambulatory Visit (INDEPENDENT_AMBULATORY_CARE_PROVIDER_SITE_OTHER): Payer: Medicare Other

## 2011-03-26 DIAGNOSIS — J309 Allergic rhinitis, unspecified: Secondary | ICD-10-CM

## 2011-04-02 ENCOUNTER — Ambulatory Visit (INDEPENDENT_AMBULATORY_CARE_PROVIDER_SITE_OTHER): Payer: Medicare Other

## 2011-04-02 DIAGNOSIS — J309 Allergic rhinitis, unspecified: Secondary | ICD-10-CM

## 2011-04-08 ENCOUNTER — Encounter: Payer: Self-pay | Admitting: Internal Medicine

## 2011-04-09 ENCOUNTER — Ambulatory Visit (INDEPENDENT_AMBULATORY_CARE_PROVIDER_SITE_OTHER): Payer: Medicare Other

## 2011-04-09 DIAGNOSIS — J309 Allergic rhinitis, unspecified: Secondary | ICD-10-CM

## 2011-04-15 ENCOUNTER — Ambulatory Visit (INDEPENDENT_AMBULATORY_CARE_PROVIDER_SITE_OTHER): Payer: Medicare Other

## 2011-04-15 DIAGNOSIS — J309 Allergic rhinitis, unspecified: Secondary | ICD-10-CM

## 2011-04-17 ENCOUNTER — Other Ambulatory Visit: Payer: Self-pay | Admitting: Internal Medicine

## 2011-04-17 ENCOUNTER — Encounter (HOSPITAL_BASED_OUTPATIENT_CLINIC_OR_DEPARTMENT_OTHER): Payer: Medicare Other | Admitting: Internal Medicine

## 2011-04-17 ENCOUNTER — Encounter (HOSPITAL_COMMUNITY): Payer: Self-pay

## 2011-04-17 ENCOUNTER — Ambulatory Visit (HOSPITAL_COMMUNITY)
Admission: RE | Admit: 2011-04-17 | Discharge: 2011-04-17 | Disposition: A | Discharge status: Home / Self Care | Payer: Medicare Other | Source: Ambulatory Visit | Attending: Internal Medicine | Admitting: Internal Medicine

## 2011-04-17 DIAGNOSIS — K7689 Other specified diseases of liver: Secondary | ICD-10-CM | POA: Insufficient documentation

## 2011-04-17 DIAGNOSIS — Z902 Acquired absence of lung [part of]: Secondary | ICD-10-CM | POA: Insufficient documentation

## 2011-04-17 DIAGNOSIS — J984 Other disorders of lung: Secondary | ICD-10-CM | POA: Insufficient documentation

## 2011-04-17 DIAGNOSIS — R0602 Shortness of breath: Secondary | ICD-10-CM | POA: Insufficient documentation

## 2011-04-17 DIAGNOSIS — C3490 Malignant neoplasm of unspecified part of unspecified bronchus or lung: Secondary | ICD-10-CM

## 2011-04-17 DIAGNOSIS — C3410 Malignant neoplasm of upper lobe, unspecified bronchus or lung: Secondary | ICD-10-CM | POA: Insufficient documentation

## 2011-04-17 DIAGNOSIS — I2510 Atherosclerotic heart disease of native coronary artery without angina pectoris: Secondary | ICD-10-CM | POA: Insufficient documentation

## 2011-04-17 DIAGNOSIS — J438 Other emphysema: Secondary | ICD-10-CM | POA: Insufficient documentation

## 2011-04-17 HISTORY — DX: Malignant (primary) neoplasm, unspecified: C80.1

## 2011-04-17 LAB — CBC WITH DIFFERENTIAL/PLATELET
Basophils Absolute: 0 10*3/uL (ref 0.0–0.1)
EOS%: 1.9 % (ref 0.0–7.0)
HCT: 38.5 % (ref 34.8–46.6)
HGB: 13.1 g/dL (ref 11.6–15.9)
MCH: 32.2 pg (ref 25.1–34.0)
MCV: 94.9 fL (ref 79.5–101.0)
MONO%: 5.4 % (ref 0.0–14.0)
NEUT%: 63.2 % (ref 38.4–76.8)
lymph#: 2 10*3/uL (ref 0.9–3.3)

## 2011-04-17 LAB — CMP (CANCER CENTER ONLY)
AST: 28 U/L (ref 11–38)
BUN, Bld: 19 mg/dL (ref 7–22)
Calcium: 9.4 mg/dL (ref 8.0–10.3)
Chloride: 94 mEq/L — ABNORMAL LOW (ref 98–108)
Creat: 0.9 mg/dl (ref 0.6–1.2)

## 2011-04-17 MED ORDER — IOHEXOL 300 MG/ML  SOLN
80.0000 mL | Freq: Once | INTRAMUSCULAR | Status: AC | PRN
  Administered 2011-04-17: 80 mL via INTRAVENOUS

## 2011-04-24 ENCOUNTER — Ambulatory Visit (INDEPENDENT_AMBULATORY_CARE_PROVIDER_SITE_OTHER): Payer: Medicare Other

## 2011-04-24 ENCOUNTER — Encounter (HOSPITAL_BASED_OUTPATIENT_CLINIC_OR_DEPARTMENT_OTHER): Payer: Medicare Other | Admitting: Internal Medicine

## 2011-04-24 DIAGNOSIS — J309 Allergic rhinitis, unspecified: Secondary | ICD-10-CM

## 2011-04-24 DIAGNOSIS — C3410 Malignant neoplasm of upper lobe, unspecified bronchus or lung: Secondary | ICD-10-CM

## 2011-05-02 ENCOUNTER — Ambulatory Visit (INDEPENDENT_AMBULATORY_CARE_PROVIDER_SITE_OTHER): Payer: Medicare Other

## 2011-05-02 DIAGNOSIS — J309 Allergic rhinitis, unspecified: Secondary | ICD-10-CM

## 2011-05-03 ENCOUNTER — Ambulatory Visit (INDEPENDENT_AMBULATORY_CARE_PROVIDER_SITE_OTHER): Payer: Medicare Other

## 2011-05-03 DIAGNOSIS — J309 Allergic rhinitis, unspecified: Secondary | ICD-10-CM

## 2011-05-06 ENCOUNTER — Ambulatory Visit: Payer: Medicare Other

## 2011-05-09 ENCOUNTER — Ambulatory Visit (INDEPENDENT_AMBULATORY_CARE_PROVIDER_SITE_OTHER): Payer: Medicare Other

## 2011-05-09 DIAGNOSIS — J309 Allergic rhinitis, unspecified: Secondary | ICD-10-CM

## 2011-05-15 ENCOUNTER — Ambulatory Visit (INDEPENDENT_AMBULATORY_CARE_PROVIDER_SITE_OTHER): Payer: Medicare Other

## 2011-05-15 DIAGNOSIS — J309 Allergic rhinitis, unspecified: Secondary | ICD-10-CM

## 2011-05-23 ENCOUNTER — Ambulatory Visit (INDEPENDENT_AMBULATORY_CARE_PROVIDER_SITE_OTHER): Payer: Medicare Other

## 2011-05-23 DIAGNOSIS — J309 Allergic rhinitis, unspecified: Secondary | ICD-10-CM

## 2011-05-29 ENCOUNTER — Ambulatory Visit (INDEPENDENT_AMBULATORY_CARE_PROVIDER_SITE_OTHER): Payer: Medicare Other

## 2011-05-29 DIAGNOSIS — J309 Allergic rhinitis, unspecified: Secondary | ICD-10-CM

## 2011-06-03 ENCOUNTER — Ambulatory Visit (INDEPENDENT_AMBULATORY_CARE_PROVIDER_SITE_OTHER): Payer: Medicare Other

## 2011-06-03 DIAGNOSIS — J309 Allergic rhinitis, unspecified: Secondary | ICD-10-CM

## 2011-06-05 ENCOUNTER — Ambulatory Visit (INDEPENDENT_AMBULATORY_CARE_PROVIDER_SITE_OTHER): Payer: Medicare Other

## 2011-06-05 DIAGNOSIS — J309 Allergic rhinitis, unspecified: Secondary | ICD-10-CM

## 2011-06-10 ENCOUNTER — Ambulatory Visit (INDEPENDENT_AMBULATORY_CARE_PROVIDER_SITE_OTHER): Payer: Medicare Other

## 2011-06-10 DIAGNOSIS — J309 Allergic rhinitis, unspecified: Secondary | ICD-10-CM

## 2011-06-18 ENCOUNTER — Ambulatory Visit (INDEPENDENT_AMBULATORY_CARE_PROVIDER_SITE_OTHER): Payer: Medicare Other

## 2011-06-18 DIAGNOSIS — J309 Allergic rhinitis, unspecified: Secondary | ICD-10-CM

## 2011-06-19 ENCOUNTER — Encounter: Payer: Self-pay | Admitting: Internal Medicine

## 2011-06-25 ENCOUNTER — Ambulatory Visit (INDEPENDENT_AMBULATORY_CARE_PROVIDER_SITE_OTHER): Payer: Medicare Other

## 2011-06-25 DIAGNOSIS — J309 Allergic rhinitis, unspecified: Secondary | ICD-10-CM

## 2011-06-26 ENCOUNTER — Telehealth: Payer: Self-pay | Admitting: Internal Medicine

## 2011-06-26 MED ORDER — PREDNISONE (PAK) 10 MG PO TABS: ORAL_TABLET | ORAL | Status: DC

## 2011-06-26 MED ORDER — PREDNISONE (PAK) 10 MG PO TABS: 10.0000 mg | ORAL_TABLET | Freq: Every day | ORAL | Status: DC

## 2011-06-26 MED ORDER — AZITHROMYCIN 250 MG PO TABS: ORAL_TABLET | ORAL | Status: AC

## 2011-06-26 MED ORDER — AZITHROMYCIN 250 MG PO TABS: ORAL_TABLET | ORAL | Status: DC

## 2011-06-26 NOTE — Telephone Encounter (Signed)
Pt c/o increasing chest tightness, sob, wheezing and cough with yellow/green mucus for 2-3 days. She is scheduled for OV on 8/27 w/ CDY but feels like she needs to be seen before then or something needs to be called to [facility], [facility] [address]. Pls advise. Allergies  Allergen Reactions  . Aspirin     REACTION: unknown reaction  . Penicillins     REACTION: unknown reaction

## 2011-06-26 NOTE — Telephone Encounter (Signed)
Per CDY-Prednisone 10mg  #20 4 tabs x 2 days, 3 tabs x 2 days, 2 tabs x 2 days, 1 tab x 2 days, and Zpak   Pt aware and will keep appointment scheduled for Monday.

## 2011-06-28 ENCOUNTER — Encounter: Payer: Self-pay | Admitting: Internal Medicine

## 2011-07-01 ENCOUNTER — Ambulatory Visit (INDEPENDENT_AMBULATORY_CARE_PROVIDER_SITE_OTHER): Payer: Medicare Other

## 2011-07-01 ENCOUNTER — Ambulatory Visit (INDEPENDENT_AMBULATORY_CARE_PROVIDER_SITE_OTHER): Payer: Medicare Other | Admitting: Internal Medicine

## 2011-07-01 ENCOUNTER — Encounter: Payer: Self-pay | Admitting: Internal Medicine

## 2011-07-01 VITALS — BP 132/84 | HR 71 | Ht 63.0 in | Wt 168.4 lb

## 2011-07-01 DIAGNOSIS — J309 Allergic rhinitis, unspecified: Secondary | ICD-10-CM

## 2011-07-01 DIAGNOSIS — J449 Chronic obstructive pulmonary disease, unspecified: Secondary | ICD-10-CM

## 2011-07-01 DIAGNOSIS — C3490 Malignant neoplasm of unspecified part of unspecified bronchus or lung: Secondary | ICD-10-CM

## 2011-07-01 DIAGNOSIS — J301 Allergic rhinitis due to pollen: Secondary | ICD-10-CM

## 2011-07-01 NOTE — Assessment & Plan Note (Addendum)
Exertional dyspnea may relate more to fitness than to her lungs or heart.  Plan 6 MWT next visitTry change to Advair 500 to see what that does.

## 2011-07-01 NOTE — Assessment & Plan Note (Addendum)
Does well with allergy vaccine. States she does believe this cuts down on the frequency of sinus infections and nasal discomfort.

## 2011-07-01 NOTE — Patient Instructions (Addendum)
Sample Advair 500-  1 puffs and rinse well, twice every day. If this does better than the 250 strength, then call us to change your prescription.   Order- schedule 6 MWT on room air   Dx COPD

## 2011-07-01 NOTE — Progress Notes (Signed)
Subjective:    Patient ID: [name] [name], female    DOB: April 22, 1927, 77 y.o.   MRN: [id number]  HPI 07/01/11- 77 year old female former smoker (100 pack years ) followed for asthma/COPD, allergic rhinitis, chronic sinusitis, remote history lung cancer Last here - January 15, 2011,  Went to her primary office at [facility] NP for viral illness in June. Had a lot of shortness of breath while in rehab at her neurosurgery office for followup of back pain. Pools liquid in her mouth. Denies easy choking with swallowing; denies smothering when lying down at night.  Continues allergy vaccine. This week using rescue inhaler several times/ day- does help. Advair 250. Finishes prednisone taper in 3 more days- it did help a lot CT chest 11/23/10- RULobectomy, emphysema, NAD.  Plan 6 MWT next visit.   Review of Systems Constitutional:   No-   weight loss, night sweats, fevers, chills, fatigue, lassitude. HEENT:   No-  headaches, difficulty swallowing, tooth/dental problems, sore throat,       No-  sneezing, itching, ear ache, nasal congestion, post nasal drip,  CV:  No-   chest pain, orthopnea, PND, swelling in lower extremities, anasarca,                                  dizziness, palpitations Resp: +   shortness of breath with exertion or at rest.              No-   productive cough,  No non-productive cough,  No-  coughing up of blood.              No-   change in color of mucus.  No- wheezing.   Skin: No-   rash or lesions. GI:  No-   heartburn, indigestion, abdominal pain, nausea, vomiting, diarrhea,                 change in bowel habits, loss of appetite GU: No-   dysuria, change in color of urine, no urgency or frequency.  No- flank pain. MS:  No-   joint pain or swelling.  No- decreased range of motion.  + back pain. Neuro- grossly normal to observation, Or:  Psych:  No- change in mood or affect. No depression or anxiety.  No memory loss.      Objective:   Physical Exam General- Alert,  Oriented, Affect-appropriate, Distress- none acute Skin- rash-none, lesions- none, excoriation- none Lymphadenopathy- none Head- atraumatic            Eyes- Gross vision intact, PERRLA, conjunctivae clear secretions            Ears- Hearing, canals normal            Nose- Clear, No-Septal dev, mucus, polyps, erosion, perforation             Throat- Mallampati II , mucosa clear , drainage- none, tonsils- atrophic Neck- flexible , trachea midline, no stridor , thyroid nl, carotid no bruit Chest - symmetrical excursion , unlabored           Heart/CV- RRR , no murmur , no gallop  , no rub, nl s1 s2  Pulse is normal and regular.                           - JVD- none , edema- none, stasis changes- none, varices- none  Lung- clear to P&A, wheeze- none, cough- none , dullness-none, rub- none   There is no wheeze, unlabored.           Chest wall-  Abd- tender-no, distended-no, bowel sounds-present, HSM- no Br/ Gen/ Rectal- Not done, not indicated Extrem- cyanosis- none, clubbing, none, atrophy- none, strength- nl Neuro- grossly intact to observation         Assessment & Plan:

## 2011-07-04 ENCOUNTER — Encounter: Payer: Self-pay | Admitting: Internal Medicine

## 2011-07-09 ENCOUNTER — Telehealth: Payer: Self-pay | Admitting: Internal Medicine

## 2011-07-09 ENCOUNTER — Ambulatory Visit (INDEPENDENT_AMBULATORY_CARE_PROVIDER_SITE_OTHER): Payer: Medicare Other

## 2011-07-09 DIAGNOSIS — J309 Allergic rhinitis, unspecified: Secondary | ICD-10-CM

## 2011-07-09 MED ORDER — FLUTICASONE-SALMETEROL 500-50 MCG/DOSE IN AEPB: INHALATION_SPRAY | RESPIRATORY_TRACT | Status: DC

## 2011-07-09 MED ORDER — MOMETASONE FUROATE 50 MCG/ACT NA SUSP: NASAL | Status: DC

## 2011-07-09 NOTE — Telephone Encounter (Signed)
Per pt instructions from 07/01/11, Sample Advair 500- 1 puffs and rinse well, twice every day. If this does better than the 250 strength, then call us to change your prescription.   Called, spoke with pt.  States the advair 500 is working better than the advair 250 strength.  She is requesting a 90 day rx for advair 500 and nasonex - [facility] [address].  Rx sent -- pt aware.

## 2011-07-16 ENCOUNTER — Ambulatory Visit: Payer: Medicare Other | Admitting: Internal Medicine

## 2011-07-16 ENCOUNTER — Ambulatory Visit (INDEPENDENT_AMBULATORY_CARE_PROVIDER_SITE_OTHER): Payer: Medicare Other

## 2011-07-16 DIAGNOSIS — J309 Allergic rhinitis, unspecified: Secondary | ICD-10-CM

## 2011-07-24 ENCOUNTER — Ambulatory Visit (INDEPENDENT_AMBULATORY_CARE_PROVIDER_SITE_OTHER): Payer: Medicare Other

## 2011-07-24 DIAGNOSIS — J309 Allergic rhinitis, unspecified: Secondary | ICD-10-CM

## 2011-07-24 DIAGNOSIS — Z23 Encounter for immunization: Secondary | ICD-10-CM

## 2011-07-25 DIAGNOSIS — Z23 Encounter for immunization: Secondary | ICD-10-CM

## 2011-07-31 ENCOUNTER — Ambulatory Visit (INDEPENDENT_AMBULATORY_CARE_PROVIDER_SITE_OTHER): Payer: Medicare Other

## 2011-07-31 DIAGNOSIS — J309 Allergic rhinitis, unspecified: Secondary | ICD-10-CM

## 2011-08-06 ENCOUNTER — Ambulatory Visit (INDEPENDENT_AMBULATORY_CARE_PROVIDER_SITE_OTHER): Payer: Medicare Other

## 2011-08-06 DIAGNOSIS — J309 Allergic rhinitis, unspecified: Secondary | ICD-10-CM

## 2011-08-13 ENCOUNTER — Ambulatory Visit (INDEPENDENT_AMBULATORY_CARE_PROVIDER_SITE_OTHER): Payer: Medicare Other

## 2011-08-13 DIAGNOSIS — J309 Allergic rhinitis, unspecified: Secondary | ICD-10-CM

## 2011-08-20 ENCOUNTER — Ambulatory Visit (INDEPENDENT_AMBULATORY_CARE_PROVIDER_SITE_OTHER): Payer: Medicare Other

## 2011-08-20 DIAGNOSIS — J309 Allergic rhinitis, unspecified: Secondary | ICD-10-CM

## 2011-08-27 ENCOUNTER — Ambulatory Visit (INDEPENDENT_AMBULATORY_CARE_PROVIDER_SITE_OTHER): Payer: Medicare Other

## 2011-08-27 DIAGNOSIS — J309 Allergic rhinitis, unspecified: Secondary | ICD-10-CM

## 2011-09-03 ENCOUNTER — Ambulatory Visit (INDEPENDENT_AMBULATORY_CARE_PROVIDER_SITE_OTHER): Payer: Medicare Other

## 2011-09-03 DIAGNOSIS — J309 Allergic rhinitis, unspecified: Secondary | ICD-10-CM

## 2011-09-04 ENCOUNTER — Ambulatory Visit (INDEPENDENT_AMBULATORY_CARE_PROVIDER_SITE_OTHER): Payer: Medicare Other

## 2011-09-04 DIAGNOSIS — J309 Allergic rhinitis, unspecified: Secondary | ICD-10-CM

## 2011-09-10 ENCOUNTER — Ambulatory Visit (INDEPENDENT_AMBULATORY_CARE_PROVIDER_SITE_OTHER): Payer: Medicare Other

## 2011-09-10 DIAGNOSIS — J309 Allergic rhinitis, unspecified: Secondary | ICD-10-CM

## 2011-09-16 ENCOUNTER — Ambulatory Visit (INDEPENDENT_AMBULATORY_CARE_PROVIDER_SITE_OTHER): Payer: Medicare Other

## 2011-09-16 DIAGNOSIS — J309 Allergic rhinitis, unspecified: Secondary | ICD-10-CM

## 2011-09-23 ENCOUNTER — Ambulatory Visit (INDEPENDENT_AMBULATORY_CARE_PROVIDER_SITE_OTHER): Payer: Medicare Other

## 2011-09-23 DIAGNOSIS — J309 Allergic rhinitis, unspecified: Secondary | ICD-10-CM

## 2011-09-30 ENCOUNTER — Encounter: Payer: Self-pay | Admitting: Internal Medicine

## 2011-10-01 ENCOUNTER — Ambulatory Visit (INDEPENDENT_AMBULATORY_CARE_PROVIDER_SITE_OTHER): Payer: Medicare Other

## 2011-10-01 DIAGNOSIS — J309 Allergic rhinitis, unspecified: Secondary | ICD-10-CM

## 2011-10-07 ENCOUNTER — Ambulatory Visit (INDEPENDENT_AMBULATORY_CARE_PROVIDER_SITE_OTHER): Payer: Medicare Other

## 2011-10-07 DIAGNOSIS — J309 Allergic rhinitis, unspecified: Secondary | ICD-10-CM

## 2011-10-14 ENCOUNTER — Ambulatory Visit (INDEPENDENT_AMBULATORY_CARE_PROVIDER_SITE_OTHER): Payer: Medicare Other

## 2011-10-14 DIAGNOSIS — J309 Allergic rhinitis, unspecified: Secondary | ICD-10-CM

## 2011-10-21 ENCOUNTER — Ambulatory Visit (INDEPENDENT_AMBULATORY_CARE_PROVIDER_SITE_OTHER): Payer: Medicare Other

## 2011-10-21 DIAGNOSIS — J309 Allergic rhinitis, unspecified: Secondary | ICD-10-CM

## 2011-10-31 ENCOUNTER — Ambulatory Visit (INDEPENDENT_AMBULATORY_CARE_PROVIDER_SITE_OTHER): Payer: Medicare Other

## 2011-10-31 DIAGNOSIS — J309 Allergic rhinitis, unspecified: Secondary | ICD-10-CM

## 2011-11-01 ENCOUNTER — Telehealth: Payer: Self-pay | Admitting: Internal Medicine

## 2011-11-01 MED ORDER — ALBUTEROL SULFATE HFA 108 (90 BASE) MCG/ACT IN AERS: 2.0000 | INHALATION_SPRAY | Freq: Four times a day (QID) | RESPIRATORY_TRACT | Status: DC | PRN

## 2011-11-01 NOTE — Telephone Encounter (Signed)
I spoke with pt and she states she needs a refill on her Proventil and needs a 90 day supply. [facility]. Pt needed nothing further

## 2011-11-06 ENCOUNTER — Ambulatory Visit (INDEPENDENT_AMBULATORY_CARE_PROVIDER_SITE_OTHER): Payer: Medicare Other

## 2011-11-06 DIAGNOSIS — J309 Allergic rhinitis, unspecified: Secondary | ICD-10-CM

## 2011-11-07 DIAGNOSIS — M542 Cervicalgia: Secondary | ICD-10-CM | POA: Diagnosis not present

## 2011-11-13 ENCOUNTER — Ambulatory Visit (INDEPENDENT_AMBULATORY_CARE_PROVIDER_SITE_OTHER): Payer: Medicare Other

## 2011-11-13 DIAGNOSIS — J309 Allergic rhinitis, unspecified: Secondary | ICD-10-CM

## 2011-11-19 ENCOUNTER — Ambulatory Visit (INDEPENDENT_AMBULATORY_CARE_PROVIDER_SITE_OTHER): Payer: Medicare Other

## 2011-11-19 DIAGNOSIS — J309 Allergic rhinitis, unspecified: Secondary | ICD-10-CM

## 2011-11-27 ENCOUNTER — Ambulatory Visit (INDEPENDENT_AMBULATORY_CARE_PROVIDER_SITE_OTHER): Payer: Medicare Other

## 2011-11-27 DIAGNOSIS — M1990 Unspecified osteoarthritis, unspecified site: Secondary | ICD-10-CM | POA: Diagnosis not present

## 2011-11-27 DIAGNOSIS — C3490 Malignant neoplasm of unspecified part of unspecified bronchus or lung: Secondary | ICD-10-CM | POA: Diagnosis not present

## 2011-11-27 DIAGNOSIS — I10 Essential (primary) hypertension: Secondary | ICD-10-CM | POA: Diagnosis not present

## 2011-11-27 DIAGNOSIS — R7309 Other abnormal glucose: Secondary | ICD-10-CM | POA: Diagnosis not present

## 2011-11-27 DIAGNOSIS — M810 Age-related osteoporosis without current pathological fracture: Secondary | ICD-10-CM | POA: Diagnosis not present

## 2011-11-27 DIAGNOSIS — E782 Mixed hyperlipidemia: Secondary | ICD-10-CM | POA: Diagnosis not present

## 2011-11-27 DIAGNOSIS — Z0000 Encounter for general adult medical examination without abnormal findings: Secondary | ICD-10-CM | POA: Diagnosis not present

## 2011-11-27 DIAGNOSIS — J449 Chronic obstructive pulmonary disease, unspecified: Secondary | ICD-10-CM | POA: Diagnosis not present

## 2011-11-27 DIAGNOSIS — J309 Allergic rhinitis, unspecified: Secondary | ICD-10-CM

## 2011-12-03 ENCOUNTER — Ambulatory Visit (INDEPENDENT_AMBULATORY_CARE_PROVIDER_SITE_OTHER): Payer: Medicare Other

## 2011-12-03 DIAGNOSIS — J309 Allergic rhinitis, unspecified: Secondary | ICD-10-CM | POA: Diagnosis not present

## 2011-12-09 ENCOUNTER — Ambulatory Visit (INDEPENDENT_AMBULATORY_CARE_PROVIDER_SITE_OTHER): Payer: Medicare Other

## 2011-12-09 DIAGNOSIS — J309 Allergic rhinitis, unspecified: Secondary | ICD-10-CM

## 2011-12-16 DIAGNOSIS — M542 Cervicalgia: Secondary | ICD-10-CM | POA: Diagnosis not present

## 2011-12-17 ENCOUNTER — Ambulatory Visit (INDEPENDENT_AMBULATORY_CARE_PROVIDER_SITE_OTHER): Payer: Medicare Other

## 2011-12-17 DIAGNOSIS — J309 Allergic rhinitis, unspecified: Secondary | ICD-10-CM

## 2011-12-23 ENCOUNTER — Ambulatory Visit (INDEPENDENT_AMBULATORY_CARE_PROVIDER_SITE_OTHER): Payer: Medicare Other

## 2011-12-23 DIAGNOSIS — J309 Allergic rhinitis, unspecified: Secondary | ICD-10-CM

## 2011-12-25 ENCOUNTER — Telehealth: Payer: Self-pay | Admitting: Internal Medicine

## 2011-12-25 NOTE — Telephone Encounter (Signed)
LOV ( young) faxed to [facility] @ [phone] 12/25/11/KM

## 2011-12-26 ENCOUNTER — Encounter: Payer: Self-pay | Admitting: Internal Medicine

## 2011-12-27 DIAGNOSIS — M47812 Spondylosis without myelopathy or radiculopathy, cervical region: Secondary | ICD-10-CM | POA: Diagnosis not present

## 2011-12-27 DIAGNOSIS — M542 Cervicalgia: Secondary | ICD-10-CM | POA: Diagnosis not present

## 2011-12-27 DIAGNOSIS — M5380 Other specified dorsopathies, site unspecified: Secondary | ICD-10-CM | POA: Diagnosis not present

## 2011-12-30 ENCOUNTER — Ambulatory Visit: Payer: Medicare Other | Admitting: Internal Medicine

## 2011-12-30 ENCOUNTER — Ambulatory Visit: Payer: Medicare Other

## 2012-01-01 ENCOUNTER — Ambulatory Visit (INDEPENDENT_AMBULATORY_CARE_PROVIDER_SITE_OTHER): Payer: Medicare Other

## 2012-01-01 DIAGNOSIS — J309 Allergic rhinitis, unspecified: Secondary | ICD-10-CM | POA: Diagnosis not present

## 2012-01-07 ENCOUNTER — Ambulatory Visit (INDEPENDENT_AMBULATORY_CARE_PROVIDER_SITE_OTHER): Payer: Medicare Other

## 2012-01-07 DIAGNOSIS — J309 Allergic rhinitis, unspecified: Secondary | ICD-10-CM | POA: Diagnosis not present

## 2012-01-08 ENCOUNTER — Ambulatory Visit (INDEPENDENT_AMBULATORY_CARE_PROVIDER_SITE_OTHER): Payer: Medicare Other

## 2012-01-08 DIAGNOSIS — J309 Allergic rhinitis, unspecified: Secondary | ICD-10-CM | POA: Diagnosis not present

## 2012-01-14 ENCOUNTER — Ambulatory Visit (INDEPENDENT_AMBULATORY_CARE_PROVIDER_SITE_OTHER): Payer: Medicare Other

## 2012-01-14 DIAGNOSIS — J309 Allergic rhinitis, unspecified: Secondary | ICD-10-CM

## 2012-01-21 ENCOUNTER — Other Ambulatory Visit: Payer: Self-pay | Admitting: Internal Medicine

## 2012-01-21 DIAGNOSIS — Z1231 Encounter for screening mammogram for malignant neoplasm of breast: Secondary | ICD-10-CM

## 2012-01-22 ENCOUNTER — Ambulatory Visit (INDEPENDENT_AMBULATORY_CARE_PROVIDER_SITE_OTHER): Payer: Medicare Other

## 2012-01-22 DIAGNOSIS — J309 Allergic rhinitis, unspecified: Secondary | ICD-10-CM | POA: Diagnosis not present

## 2012-01-22 DIAGNOSIS — M810 Age-related osteoporosis without current pathological fracture: Secondary | ICD-10-CM | POA: Diagnosis not present

## 2012-01-24 ENCOUNTER — Encounter: Payer: Self-pay | Admitting: Internal Medicine

## 2012-01-24 ENCOUNTER — Ambulatory Visit (INDEPENDENT_AMBULATORY_CARE_PROVIDER_SITE_OTHER): Payer: Medicare Other | Admitting: Internal Medicine

## 2012-01-24 VITALS — BP 138/76 | HR 66 | Ht 63.0 in | Wt 173.2 lb

## 2012-01-24 DIAGNOSIS — J301 Allergic rhinitis due to pollen: Secondary | ICD-10-CM | POA: Diagnosis not present

## 2012-01-24 DIAGNOSIS — J449 Chronic obstructive pulmonary disease, unspecified: Secondary | ICD-10-CM

## 2012-01-24 DIAGNOSIS — C3490 Malignant neoplasm of unspecified part of unspecified bronchus or lung: Secondary | ICD-10-CM | POA: Diagnosis not present

## 2012-01-24 NOTE — Patient Instructions (Signed)
Continue present meds  Please call as needed 

## 2012-01-24 NOTE — Progress Notes (Signed)
Patient ID: [name] [name], female    DOB: [birth year], 78 y.o.   MRN: [id number]  HPI 07/01/11- 77 year old female former smoker (100 pack years ) followed for asthma/COPD, allergic rhinitis, chronic sinusitis, remote history lung cancer Last here - January 15, 2011,  Went to her primary office at [facility] NP for viral illness in June. Had a lot of shortness of breath while in rehab at her neurosurgery office for followup of back pain. Pools liquid in her mouth. Denies easy choking with swallowing; denies smothering when lying down at night.  Continues allergy vaccine. This week using rescue inhaler several times/ day- does help. Advair 250. Finishes prednisone taper in 3 more days- it did help a lot CT chest 11/23/10- RULobectomy, emphysema, NAD.  Plan 6 MWT next visit.   01/24/12-  78 year old female former smoker (100 pack years ) followed for asthma/COPD, allergic rhinitis, chronic sinusitis, remote history lung cancer Has had nerve blocks in cervical spine to relieve occipital pain. No respiratory complaints associated with that. Breathing overall is better. Continues allergy vaccine here at 1:10 without problem. 6 minute walk test today was reviewed with her: 97%, 98%, 97%. Walk only 168 m. Stopped at 3 minutes complaining of dizziness and weakness but able to restart after a short rest. Impression: Limited exercise tolerance with complaints of dizziness and weakness not reflected in blood pressure, heart rate or oxygen levels.   Review of Systems-see HPI Constitutional:   No-   weight loss, night sweats, fevers, chills, fatigue, lassitude. HEENT:   No-  headaches, difficulty swallowing, tooth/dental problems, sore throat,       No-  sneezing, itching, ear ache, nasal congestion, post nasal drip,  CV:  No-   chest pain, orthopnea, PND, swelling in lower extremities, anasarca, dizziness, palpitations Resp: +   shortness of breath with exertion or at rest.              No-   productive cough,   No non-productive cough,  No-  coughing up of blood.              No-   change in color of mucus.  No- wheezing.   Skin: No-   rash or lesions. GI:  No-   heartburn, indigestion, abdominal pain, nausea, vomiting, diarrhea,                 change in bowel habits, loss of appetite GU: No-   dysuria, change in color of urine, no urgency or frequency.  No- flank pain.  MS:  No-   joint pain or swelling.  .  + back pain. Neuro- weak and dizzy with exertion Psych:  No- change in mood or affect. No depression or anxiety.  No memory loss.      Objective:   Physical Exam General- Alert, Oriented, Affect-appropriate, Distress- none acute Skin- rash-none, lesions- none, excoriation- none Lymphadenopathy- none Head- atraumatic            Eyes- Gross vision intact, PERRLA, conjunctivae clear secretions            Ears- Hearing, canals normal            Nose- Clear, No-Septal dev, mucus, polyps, erosion, perforation             Throat- Mallampati III , mucosa clear , drainage- none, tonsils- atrophic Neck- flexible , trachea midline, no stridor , thyroid nl, carotid no bruit Chest - symmetrical excursion , unlabored  Heart/CV- RRR , no murmur , no gallop  , no rub, nl s1 s2  Pulse is normal and regular.                            JVD- none , edema- none, stasis changes- none, varices- none           Lung- clear to P&A, wheeze- none, cough- none , dullness-none, rub- none   There is no wheeze, unlabored.           Chest wall-  Abd-  Br/ Gen/ Rectal- Not done, not indicated Extrem- cyanosis- none, clubbing, none, atrophy- none, strength- nl Neuro- right facial tic

## 2012-01-26 NOTE — Progress Notes (Signed)
Documentation for 6 minute walk 

## 2012-01-28 ENCOUNTER — Ambulatory Visit
Admission: RE | Admit: 2012-01-28 | Discharge: 2012-01-28 | Disposition: A | Discharge status: Home / Self Care | Payer: Medicare Other | Source: Ambulatory Visit | Attending: Internal Medicine | Admitting: Internal Medicine

## 2012-01-28 ENCOUNTER — Ambulatory Visit (INDEPENDENT_AMBULATORY_CARE_PROVIDER_SITE_OTHER): Payer: Medicare Other

## 2012-01-28 DIAGNOSIS — J309 Allergic rhinitis, unspecified: Secondary | ICD-10-CM | POA: Diagnosis not present

## 2012-01-28 DIAGNOSIS — Z1231 Encounter for screening mammogram for malignant neoplasm of breast: Secondary | ICD-10-CM | POA: Diagnosis not present

## 2012-01-29 ENCOUNTER — Ambulatory Visit: Payer: Medicare Other

## 2012-01-29 ENCOUNTER — Encounter: Payer: Self-pay | Admitting: Internal Medicine

## 2012-01-29 NOTE — Assessment & Plan Note (Addendum)
Continuing vaccine without problems. Will watch for problems associated with the coming spring pollen season.

## 2012-01-29 NOTE — Assessment & Plan Note (Signed)
No recurrence seen

## 2012-02-04 ENCOUNTER — Ambulatory Visit (INDEPENDENT_AMBULATORY_CARE_PROVIDER_SITE_OTHER): Payer: Medicare Other

## 2012-02-04 DIAGNOSIS — J309 Allergic rhinitis, unspecified: Secondary | ICD-10-CM

## 2012-02-10 DIAGNOSIS — M542 Cervicalgia: Secondary | ICD-10-CM | POA: Diagnosis not present

## 2012-02-10 DIAGNOSIS — M5412 Radiculopathy, cervical region: Secondary | ICD-10-CM | POA: Diagnosis not present

## 2012-02-11 ENCOUNTER — Ambulatory Visit (INDEPENDENT_AMBULATORY_CARE_PROVIDER_SITE_OTHER): Payer: Medicare Other

## 2012-02-11 DIAGNOSIS — J309 Allergic rhinitis, unspecified: Secondary | ICD-10-CM | POA: Diagnosis not present

## 2012-02-18 ENCOUNTER — Ambulatory Visit (INDEPENDENT_AMBULATORY_CARE_PROVIDER_SITE_OTHER): Payer: Medicare Other

## 2012-02-18 DIAGNOSIS — J309 Allergic rhinitis, unspecified: Secondary | ICD-10-CM

## 2012-02-26 ENCOUNTER — Ambulatory Visit (INDEPENDENT_AMBULATORY_CARE_PROVIDER_SITE_OTHER): Payer: Medicare Other

## 2012-02-26 DIAGNOSIS — J309 Allergic rhinitis, unspecified: Secondary | ICD-10-CM | POA: Diagnosis not present

## 2012-03-03 ENCOUNTER — Ambulatory Visit (INDEPENDENT_AMBULATORY_CARE_PROVIDER_SITE_OTHER): Payer: Medicare Other

## 2012-03-03 DIAGNOSIS — J309 Allergic rhinitis, unspecified: Secondary | ICD-10-CM

## 2012-03-10 ENCOUNTER — Ambulatory Visit (INDEPENDENT_AMBULATORY_CARE_PROVIDER_SITE_OTHER): Payer: Medicare Other

## 2012-03-10 DIAGNOSIS — J309 Allergic rhinitis, unspecified: Secondary | ICD-10-CM | POA: Diagnosis not present

## 2012-03-16 ENCOUNTER — Ambulatory Visit (INDEPENDENT_AMBULATORY_CARE_PROVIDER_SITE_OTHER): Payer: Medicare Other

## 2012-03-16 DIAGNOSIS — J309 Allergic rhinitis, unspecified: Secondary | ICD-10-CM | POA: Diagnosis not present

## 2012-03-24 ENCOUNTER — Ambulatory Visit (INDEPENDENT_AMBULATORY_CARE_PROVIDER_SITE_OTHER): Payer: Medicare Other

## 2012-03-24 DIAGNOSIS — J309 Allergic rhinitis, unspecified: Secondary | ICD-10-CM | POA: Diagnosis not present

## 2012-03-26 ENCOUNTER — Other Ambulatory Visit: Payer: Self-pay | Admitting: Medical Oncology

## 2012-03-26 ENCOUNTER — Telehealth: Payer: Self-pay | Admitting: Medical Oncology

## 2012-03-26 DIAGNOSIS — C3490 Malignant neoplasm of unspecified part of unspecified bronchus or lung: Secondary | ICD-10-CM

## 2012-03-26 NOTE — Telephone Encounter (Signed)
Asking for annual appointments -onc schedule request sent and orders sent to Dr Donnald Garre for signature

## 2012-03-27 ENCOUNTER — Encounter: Payer: Self-pay | Admitting: Internal Medicine

## 2012-03-31 ENCOUNTER — Ambulatory Visit (INDEPENDENT_AMBULATORY_CARE_PROVIDER_SITE_OTHER): Payer: Medicare Other

## 2012-03-31 DIAGNOSIS — J309 Allergic rhinitis, unspecified: Secondary | ICD-10-CM

## 2012-04-02 ENCOUNTER — Telehealth: Payer: Self-pay | Admitting: Internal Medicine

## 2012-04-02 NOTE — Telephone Encounter (Signed)
S/w pt re appts for 6/21 lb/ct and 6/24 f/u.

## 2012-04-07 ENCOUNTER — Ambulatory Visit (INDEPENDENT_AMBULATORY_CARE_PROVIDER_SITE_OTHER): Payer: Medicare Other

## 2012-04-07 DIAGNOSIS — J309 Allergic rhinitis, unspecified: Secondary | ICD-10-CM

## 2012-04-09 DIAGNOSIS — M531 Cervicobrachial syndrome: Secondary | ICD-10-CM | POA: Diagnosis not present

## 2012-04-09 DIAGNOSIS — M542 Cervicalgia: Secondary | ICD-10-CM | POA: Diagnosis not present

## 2012-04-14 ENCOUNTER — Ambulatory Visit (INDEPENDENT_AMBULATORY_CARE_PROVIDER_SITE_OTHER): Payer: Medicare Other

## 2012-04-14 DIAGNOSIS — J309 Allergic rhinitis, unspecified: Secondary | ICD-10-CM

## 2012-04-21 ENCOUNTER — Ambulatory Visit (INDEPENDENT_AMBULATORY_CARE_PROVIDER_SITE_OTHER): Payer: Medicare Other

## 2012-04-21 DIAGNOSIS — J309 Allergic rhinitis, unspecified: Secondary | ICD-10-CM | POA: Diagnosis not present

## 2012-04-24 ENCOUNTER — Ambulatory Visit (HOSPITAL_COMMUNITY)
Admission: RE | Admit: 2012-04-24 | Discharge: 2012-04-24 | Disposition: A | Discharge status: Home / Self Care | Payer: Medicare Other | Source: Ambulatory Visit | Attending: Internal Medicine | Admitting: Internal Medicine

## 2012-04-24 ENCOUNTER — Other Ambulatory Visit (HOSPITAL_BASED_OUTPATIENT_CLINIC_OR_DEPARTMENT_OTHER): Payer: Medicare Other | Admitting: Lab

## 2012-04-24 DIAGNOSIS — R911 Solitary pulmonary nodule: Secondary | ICD-10-CM | POA: Insufficient documentation

## 2012-04-24 DIAGNOSIS — C3490 Malignant neoplasm of unspecified part of unspecified bronchus or lung: Secondary | ICD-10-CM

## 2012-04-24 DIAGNOSIS — C3410 Malignant neoplasm of upper lobe, unspecified bronchus or lung: Secondary | ICD-10-CM | POA: Diagnosis not present

## 2012-04-24 DIAGNOSIS — Z9221 Personal history of antineoplastic chemotherapy: Secondary | ICD-10-CM | POA: Diagnosis not present

## 2012-04-24 DIAGNOSIS — Z85118 Personal history of other malignant neoplasm of bronchus and lung: Secondary | ICD-10-CM | POA: Diagnosis not present

## 2012-04-24 DIAGNOSIS — Z902 Acquired absence of lung [part of]: Secondary | ICD-10-CM | POA: Diagnosis not present

## 2012-04-24 DIAGNOSIS — R918 Other nonspecific abnormal finding of lung field: Secondary | ICD-10-CM | POA: Diagnosis not present

## 2012-04-24 DIAGNOSIS — J984 Other disorders of lung: Secondary | ICD-10-CM | POA: Diagnosis not present

## 2012-04-24 LAB — CBC WITH DIFFERENTIAL/PLATELET
BASO%: 0.5 % (ref 0.0–2.0)
HCT: 38.4 % (ref 34.8–46.6)
LYMPH%: 19.9 % (ref 14.0–49.7)
MCH: 31.9 pg (ref 25.1–34.0)
MCHC: 33 g/dL (ref 31.5–36.0)
MCV: 96.6 fL (ref 79.5–101.0)
MONO%: 5.7 % (ref 0.0–14.0)
NEUT%: 72.5 % (ref 38.4–76.8)
Platelets: 239 10*3/uL (ref 145–400)
RBC: 3.97 10*6/uL (ref 3.70–5.45)

## 2012-04-24 LAB — CMP (CANCER CENTER ONLY)
ALT(SGPT): 20 U/L (ref 10–47)
Alkaline Phosphatase: 62 U/L (ref 26–84)
CO2: 30 mEq/L (ref 18–33)
Creat: 0.7 mg/dl (ref 0.6–1.2)
Sodium: 138 mEq/L (ref 128–145)
Total Bilirubin: 1 mg/dl (ref 0.20–1.60)
Total Protein: 7.3 g/dL (ref 6.4–8.1)

## 2012-04-24 MED ORDER — IOHEXOL 300 MG/ML  SOLN
80.0000 mL | Freq: Once | INTRAMUSCULAR | Status: AC | PRN
  Administered 2012-04-24: 80 mL via INTRAVENOUS

## 2012-04-27 ENCOUNTER — Ambulatory Visit (INDEPENDENT_AMBULATORY_CARE_PROVIDER_SITE_OTHER): Payer: Medicare Other

## 2012-04-27 ENCOUNTER — Ambulatory Visit: Payer: Medicare Other | Admitting: Internal Medicine

## 2012-04-27 ENCOUNTER — Other Ambulatory Visit: Payer: Medicare Other | Admitting: Lab

## 2012-04-27 DIAGNOSIS — J309 Allergic rhinitis, unspecified: Secondary | ICD-10-CM

## 2012-04-28 ENCOUNTER — Ambulatory Visit (INDEPENDENT_AMBULATORY_CARE_PROVIDER_SITE_OTHER): Payer: Medicare Other

## 2012-04-28 DIAGNOSIS — J309 Allergic rhinitis, unspecified: Secondary | ICD-10-CM | POA: Diagnosis not present

## 2012-05-05 ENCOUNTER — Ambulatory Visit: Payer: Medicare Other | Admitting: Internal Medicine

## 2012-05-05 ENCOUNTER — Ambulatory Visit (HOSPITAL_BASED_OUTPATIENT_CLINIC_OR_DEPARTMENT_OTHER): Payer: Medicare Other | Admitting: Internal Medicine

## 2012-05-05 ENCOUNTER — Ambulatory Visit (INDEPENDENT_AMBULATORY_CARE_PROVIDER_SITE_OTHER): Payer: Medicare Other

## 2012-05-05 ENCOUNTER — Telehealth: Payer: Self-pay | Admitting: Internal Medicine

## 2012-05-05 VITALS — BP 145/73 | HR 71 | Temp 96.9°F | Ht 63.0 in | Wt 166.1 lb

## 2012-05-05 DIAGNOSIS — C3410 Malignant neoplasm of upper lobe, unspecified bronchus or lung: Secondary | ICD-10-CM | POA: Diagnosis not present

## 2012-05-05 DIAGNOSIS — C782 Secondary malignant neoplasm of pleura: Secondary | ICD-10-CM

## 2012-05-05 DIAGNOSIS — C3490 Malignant neoplasm of unspecified part of unspecified bronchus or lung: Secondary | ICD-10-CM

## 2012-05-05 DIAGNOSIS — J309 Allergic rhinitis, unspecified: Secondary | ICD-10-CM | POA: Diagnosis not present

## 2012-05-05 DIAGNOSIS — M549 Dorsalgia, unspecified: Secondary | ICD-10-CM

## 2012-05-05 DIAGNOSIS — M542 Cervicalgia: Secondary | ICD-10-CM | POA: Diagnosis not present

## 2012-05-05 NOTE — Progress Notes (Signed)
[facility] [phone]   [phone]  OFFICE PROGRESS NOTE  DIAGNOSIS: Stage IB (T2, N0, M0) non-small cell lung cancer, bronchoalveolar carcinoma diagnosed in June of 2006  PRIOR THERAPY: #1 status post right upper lobectomy with lymph node dissection under the care of Dr. Edwyna Shell on 05/09/2005 and the pathology revealed 4.5 CM bronchoalveolar adenocarcinoma with focal involvement of the visceral pleura. #2 status post 4 cycles of adjuvant chemotherapy with carboplatin and paclitaxel. Last dose was given 09/17/2005.  CURRENT THERAPY: Observation.  INTERVAL HISTORY: [name] [name] 78 y.o. female returns to the clinic today for annual followup visit. The patient is doing fine today with no specific complaints except for back neck pain secondary to degenerative disc disease. She denied having any significant chest pain or shortness of breath, no cough or hemoptysis. She has no nausea or vomiting, no significant weight loss or night sweats. The patient has repeat CT scan of the chest performed recently and she is here today for evaluation and discussion of her scan results.  MEDICAL HISTORY: Past Medical History  Diagnosis Date  . Cancer     lung ca  . Chronic obstructive asthma, unspecified   . Malignant neoplasm of bronchus and lung, unspecified site   . Allergic rhinitis, cause unspecified   . Angioneurotic edema not elsewhere classified     ALLERGIES:  is allergic to aspirin and penicillins.  MEDICATIONS:  Current Outpatient Prescriptions  Medication Sig Dispense Refill  . acetaminophen (TYLENOL) 500 MG tablet Take 500 mg by mouth every 6 (six) hours as needed.        Marland Kitchen albuterol (PROVENTIL HFA) 108 (90 BASE) MCG/ACT inhaler Inhale 2 puffs into the lungs 4 (four) times daily as needed.  3 Inhaler  2  . calcium carbonate (OS-CAL) 600 MG TABS Take 600 mg by mouth daily.      . diazepam (VALIUM) 5 MG tablet Take 2.5 mg by mouth at bedtime as  needed.        . ergocalciferol (VITAMIN D2) 50000 UNITS capsule Take 50,000 Units by mouth every 30 (thirty) days.        . famotidine (PEPCID) 20 MG tablet Take 20 mg by mouth daily.        . Fluticasone-Salmeterol (ADVAIR DISKUS) 500-50 MCG/DOSE AEPB 1 puff and rinse well, twice every day  180 each  3  . HYDROcodone-acetaminophen (VICODIN) 5-500 MG per tablet Take 1 tablet by mouth at bedtime as needed.        . loratadine-pseudoephedrine (CLARITIN-D 24-HOUR) 10-240 MG per 24 hr tablet Take 1 tablet by mouth daily.        . meloxicam (MOBIC) 15 MG tablet Take 1/2 to 1 tablet by mouth as needed       . methocarbamol (ROBAXIN) 500 MG tablet Take 1 tablet by mouth 4 times daily.      . metoprolol (TOPROL-XL) 50 MG 24 hr tablet Take 50 mg by mouth daily.        . mometasone (NASONEX) 50 MCG/ACT nasal spray 1-2 sprays each nostril daily  51 g  3  . Omega-3 Fatty Acids (FISH OIL) 1000 MG CAPS Take 1 capsule by mouth 2 (two) times daily.        Marland Kitchen DISCONTD: Calcium Carbonate-Vitamin D (CALTRATE 600+D) 600-400 MG-UNIT per tablet Take 1 tablet by mouth daily.          SURGICAL HISTORY:  Past Surgical History  Procedure Date  . Right  upper lobectomy     REVIEW OF SYSTEMS:  A comprehensive review of systems was negative.   PHYSICAL EXAMINATION: General appearance: alert, cooperative and no distress Neck: no adenopathy Lymph nodes: Cervical, supraclavicular, and axillary nodes normal. Resp: clear to auscultation bilaterally Cardio: regular rate and rhythm, S1, S2 normal, no murmur, click, rub or gallop GI: soft, non-tender; bowel sounds normal; no masses,  no organomegaly Extremities: extremities normal, atraumatic, no cyanosis or edema Neurologic: Alert and oriented X 3, normal strength and tone. Normal symmetric reflexes. Normal coordination and gait  ECOG PERFORMANCE STATUS: 1 - Symptomatic but completely ambulatory  Blood pressure 145/73, pulse 71, temperature 96.9 F (36.1 C),  temperature source Oral, height 5\' 3"  (1.6 m), weight 166 lb 1.6 oz (75.342 kg).  LABORATORY DATA: Lab Results  Component Value Date   WBC 7.8 04/24/2012   HGB 12.7 04/24/2012   HCT 38.4 04/24/2012   MCV 96.6 04/24/2012   PLT 239 04/24/2012      Chemistry      Component Value Date/Time   NA 138 04/24/2012 1112   NA 140 11/13/2010 1403   K 4.5 04/24/2012 1112   K 5.2* 11/13/2010 1403   CL 94* 04/24/2012 1112   CL 102 11/13/2010 1403   CO2 30 04/24/2012 1112   CO2 30 11/13/2010 1403   BUN 22 04/24/2012 1112   BUN 20 11/13/2010 1403   CREATININE 0.7 04/24/2012 1112   CREATININE 1.0 11/13/2010 1403      Component Value Date/Time   CALCIUM 9.5 04/24/2012 1112   CALCIUM 9.6 11/13/2010 1403   ALKPHOS 62 04/24/2012 1112   ALKPHOS 58 04/18/2010 1206   AST 24 04/24/2012 1112   AST 18 04/18/2010 1206   ALT 11 04/18/2010 1206   BILITOT 1.00 04/24/2012 1112   BILITOT 0.9 04/18/2010 1206       RADIOGRAPHIC STUDIES: Ct Chest W Contrast  04/24/2012  *RADIOLOGY REPORT*  Clinical Data: History of lung cancer status post right upper lobectomy and chemotherapy.  Restaging scan.  CT CHEST WITH CONTRAST  Technique:  Multidetector CT imaging of the chest was performed following the standard protocol during bolus administration of intravenous contrast.  Contrast: 80mL OMNIPAQUE IOHEXOL 300 MG/ML  SOLN follow-up scan.  Comparison: None.  Findings:  Mediastinum: Heart size is normal. There is no significant pericardial fluid, thickening or pericardial calcification. There is atherosclerosis of the thoracic aorta, the great vessels of the mediastinum and the coronary arteries, including calcified atherosclerotic plaque in the left anterior descending and right coronary arteries. No pathologically enlarged mediastinal or hilar lymph nodes. Esophagus is unremarkable in appearance. Postoperative changes of right upper lobectomy.  Lungs/Pleura: Status post right upper lobectomy with compensatory hyperexpansion of the right  middle and right lower lobes.  Small amount of peripheral scarring in the right lung, similar to prior examinations. In the anterior aspect of the left upper lobe (image 16 of series 5) there is a new 4 mm pulmonary nodule.  No other larger more suspicious appearing pulmonary nodules or masses are otherwise identified.  There are other tiny nodules which are unchanged compared to prior examination from 04/18/2010, which can be considered radiographically benign at this time, including a 4 mm nodule in the superior segment of the left lower lobe (image 26 of series 5), and a 3 mm nodule in the posterior basal segment of the right lower lobe (image 37 of series 5). No consolidative airspace disease.  No pleural effusions.  Upper Abdomen: There are several  tiny low attenuation lesions in the liver which appear similar in size, number and distribution compared to prior examinations, the largest of which measures 8 mm in the central liver immediately beneath the dome adjacent to the IVC.  Musculoskeletal: There are no aggressive appearing lytic or blastic lesions noted in the visualized portions of the skeleton.  Multiple old healed fractures are incidentally noted (anterolateral left 4th rib, and posterior right sixth rib).  IMPRESSION: 1.  Status post right upper lobectomy without definite evidence of local recurrence of disease or new metastatic disease in the thorax.  2.  However, there is a new 4 mm left upper lobe nodule (image 16 of series 5) which is highly nonspecific.  However, attention on a follow-up CT scan in 1 year is recommended. This recommendation follows the consensus statement: Guidelines for Management of Small Pulmonary Nodules Detected on CT Scans:  A Statement from the Fleischner Society as published in Radiology 2005; 237:395-400. 3. Atherosclerosis, including left anterior descending and right coronary artery disease. Please note that although the presence of coronary artery calcium documents  the presence of coronary artery disease, the severity of this disease and any potential stenosis cannot be assessed on this non-gated CT examination.  Assessment for potential risk factor modification, dietary therapy or pharmacologic therapy may be warranted, if clinically indicated.   Original Report Authenticated By: Florencia Reasons, M.D.    ASSESSMENT: This is a very pleasant 78 years old white female with history of stage IB non-small cell lung cancer status post right upper lobectomy followed by adjuvant chemotherapy. She has been observation for the last 7 years with no evidence for disease recurrence. There is a questionable new 4 mm left upper lobe nodule which is nonspecific.  PLAN: I discussed the scan results with the patient and recommended for her continuous observation for now with repeat CT scan of the chest in one year. She was advised to call me immediately if she has any concerning symptoms in the interval.  All questions were answered. The patient knows to call the clinic with any problems, questions or concerns. We can certainly see the patient much sooner if necessary.

## 2012-05-05 NOTE — Telephone Encounter (Signed)
gv pt appt schedule for June 2014 which includes ct for 04/30/2013 and 05/04/2013 f/u.

## 2012-05-08 ENCOUNTER — Telehealth: Payer: Self-pay | Admitting: *Deleted

## 2012-05-08 NOTE — Telephone Encounter (Signed)
Patient has all ready been scheduled for the scan and lab and md appointment

## 2012-05-19 ENCOUNTER — Ambulatory Visit (INDEPENDENT_AMBULATORY_CARE_PROVIDER_SITE_OTHER): Payer: Medicare Other

## 2012-05-19 DIAGNOSIS — J309 Allergic rhinitis, unspecified: Secondary | ICD-10-CM | POA: Diagnosis not present

## 2012-05-26 DIAGNOSIS — I10 Essential (primary) hypertension: Secondary | ICD-10-CM | POA: Diagnosis not present

## 2012-05-26 DIAGNOSIS — M1990 Unspecified osteoarthritis, unspecified site: Secondary | ICD-10-CM | POA: Diagnosis not present

## 2012-05-26 DIAGNOSIS — M5090 Cervical disc disorder, unspecified, unspecified cervical region: Secondary | ICD-10-CM | POA: Diagnosis not present

## 2012-05-26 DIAGNOSIS — Z23 Encounter for immunization: Secondary | ICD-10-CM | POA: Diagnosis not present

## 2012-05-26 DIAGNOSIS — J449 Chronic obstructive pulmonary disease, unspecified: Secondary | ICD-10-CM | POA: Diagnosis not present

## 2012-05-27 DIAGNOSIS — M1710 Unilateral primary osteoarthritis, unspecified knee: Secondary | ICD-10-CM | POA: Diagnosis not present

## 2012-05-27 DIAGNOSIS — M25579 Pain in unspecified ankle and joints of unspecified foot: Secondary | ICD-10-CM | POA: Diagnosis not present

## 2012-05-27 DIAGNOSIS — M25569 Pain in unspecified knee: Secondary | ICD-10-CM | POA: Diagnosis not present

## 2012-05-27 DIAGNOSIS — M25469 Effusion, unspecified knee: Secondary | ICD-10-CM | POA: Diagnosis not present

## 2012-05-28 ENCOUNTER — Ambulatory Visit (INDEPENDENT_AMBULATORY_CARE_PROVIDER_SITE_OTHER): Payer: Medicare Other

## 2012-05-28 DIAGNOSIS — J309 Allergic rhinitis, unspecified: Secondary | ICD-10-CM

## 2012-06-03 ENCOUNTER — Ambulatory Visit (INDEPENDENT_AMBULATORY_CARE_PROVIDER_SITE_OTHER): Payer: Medicare Other

## 2012-06-03 DIAGNOSIS — J309 Allergic rhinitis, unspecified: Secondary | ICD-10-CM

## 2012-06-04 DIAGNOSIS — M531 Cervicobrachial syndrome: Secondary | ICD-10-CM | POA: Diagnosis not present

## 2012-06-04 DIAGNOSIS — M542 Cervicalgia: Secondary | ICD-10-CM | POA: Diagnosis not present

## 2012-06-09 ENCOUNTER — Ambulatory Visit (INDEPENDENT_AMBULATORY_CARE_PROVIDER_SITE_OTHER): Payer: Medicare Other

## 2012-06-09 DIAGNOSIS — J309 Allergic rhinitis, unspecified: Secondary | ICD-10-CM | POA: Diagnosis not present

## 2012-06-16 ENCOUNTER — Ambulatory Visit (INDEPENDENT_AMBULATORY_CARE_PROVIDER_SITE_OTHER): Payer: Medicare Other

## 2012-06-16 DIAGNOSIS — J309 Allergic rhinitis, unspecified: Secondary | ICD-10-CM

## 2012-06-22 ENCOUNTER — Ambulatory Visit (INDEPENDENT_AMBULATORY_CARE_PROVIDER_SITE_OTHER): Payer: Medicare Other

## 2012-06-22 DIAGNOSIS — M1710 Unilateral primary osteoarthritis, unspecified knee: Secondary | ICD-10-CM | POA: Diagnosis not present

## 2012-06-22 DIAGNOSIS — J309 Allergic rhinitis, unspecified: Secondary | ICD-10-CM

## 2012-06-22 DIAGNOSIS — M25569 Pain in unspecified knee: Secondary | ICD-10-CM | POA: Diagnosis not present

## 2012-06-26 DIAGNOSIS — M542 Cervicalgia: Secondary | ICD-10-CM | POA: Diagnosis not present

## 2012-06-29 ENCOUNTER — Ambulatory Visit (INDEPENDENT_AMBULATORY_CARE_PROVIDER_SITE_OTHER): Payer: Medicare Other

## 2012-06-29 DIAGNOSIS — M1710 Unilateral primary osteoarthritis, unspecified knee: Secondary | ICD-10-CM | POA: Diagnosis not present

## 2012-06-29 DIAGNOSIS — J309 Allergic rhinitis, unspecified: Secondary | ICD-10-CM

## 2012-07-07 ENCOUNTER — Ambulatory Visit (INDEPENDENT_AMBULATORY_CARE_PROVIDER_SITE_OTHER): Payer: Medicare Other

## 2012-07-07 DIAGNOSIS — J309 Allergic rhinitis, unspecified: Secondary | ICD-10-CM | POA: Diagnosis not present

## 2012-07-07 DIAGNOSIS — M1710 Unilateral primary osteoarthritis, unspecified knee: Secondary | ICD-10-CM | POA: Diagnosis not present

## 2012-07-08 ENCOUNTER — Encounter: Payer: Self-pay | Admitting: Internal Medicine

## 2012-07-13 ENCOUNTER — Ambulatory Visit: Payer: Medicare Other

## 2012-07-13 ENCOUNTER — Ambulatory Visit (INDEPENDENT_AMBULATORY_CARE_PROVIDER_SITE_OTHER): Payer: Medicare Other

## 2012-07-13 DIAGNOSIS — J309 Allergic rhinitis, unspecified: Secondary | ICD-10-CM | POA: Diagnosis not present

## 2012-07-13 DIAGNOSIS — Z23 Encounter for immunization: Secondary | ICD-10-CM

## 2012-07-14 DIAGNOSIS — Z23 Encounter for immunization: Secondary | ICD-10-CM

## 2012-07-14 DIAGNOSIS — M1710 Unilateral primary osteoarthritis, unspecified knee: Secondary | ICD-10-CM | POA: Diagnosis not present

## 2012-07-20 ENCOUNTER — Other Ambulatory Visit: Payer: Self-pay | Admitting: Internal Medicine

## 2012-07-20 ENCOUNTER — Telehealth: Payer: Self-pay | Admitting: Internal Medicine

## 2012-07-20 ENCOUNTER — Ambulatory Visit (INDEPENDENT_AMBULATORY_CARE_PROVIDER_SITE_OTHER): Payer: Medicare Other

## 2012-07-20 DIAGNOSIS — J309 Allergic rhinitis, unspecified: Secondary | ICD-10-CM | POA: Diagnosis not present

## 2012-07-20 MED ORDER — ALBUTEROL SULFATE HFA 108 (90 BASE) MCG/ACT IN AERS: 2.0000 | INHALATION_SPRAY | Freq: Four times a day (QID) | RESPIRATORY_TRACT | Status: DC | PRN

## 2012-07-20 NOTE — Telephone Encounter (Signed)
I spoke with pt and is aware albuterol has been sent to the pharmacy. Nothing further was needed

## 2012-07-21 DIAGNOSIS — M1710 Unilateral primary osteoarthritis, unspecified knee: Secondary | ICD-10-CM | POA: Diagnosis not present

## 2012-07-27 ENCOUNTER — Ambulatory Visit: Payer: Medicare Other | Admitting: Internal Medicine

## 2012-07-28 ENCOUNTER — Encounter: Payer: Self-pay | Admitting: Internal Medicine

## 2012-07-28 ENCOUNTER — Ambulatory Visit (INDEPENDENT_AMBULATORY_CARE_PROVIDER_SITE_OTHER): Payer: Medicare Other | Admitting: Internal Medicine

## 2012-07-28 ENCOUNTER — Ambulatory Visit (INDEPENDENT_AMBULATORY_CARE_PROVIDER_SITE_OTHER): Payer: Medicare Other

## 2012-07-28 VITALS — BP 136/80 | HR 63 | Ht 63.0 in | Wt 164.6 lb

## 2012-07-28 DIAGNOSIS — J301 Allergic rhinitis due to pollen: Secondary | ICD-10-CM

## 2012-07-28 DIAGNOSIS — C3490 Malignant neoplasm of unspecified part of unspecified bronchus or lung: Secondary | ICD-10-CM

## 2012-07-28 DIAGNOSIS — J309 Allergic rhinitis, unspecified: Secondary | ICD-10-CM

## 2012-07-28 DIAGNOSIS — J449 Chronic obstructive pulmonary disease, unspecified: Secondary | ICD-10-CM | POA: Diagnosis not present

## 2012-07-28 NOTE — Patient Instructions (Addendum)
Continue allergy vaccine at 1:10  Continue present meds  Please call as needed

## 2012-07-28 NOTE — Progress Notes (Signed)
Patient ID: [name] [name], female    DOB: 07/09/1927, 78 y.o.   MRN: [id number]  HPI 07/01/11- 78 year old female former smoker (100 pack years ) followed for asthma/COPD, allergic rhinitis, chronic sinusitis, remote history lung cancer Last here - January 15, 2011,  Went to her primary office at [facility] for viral illness in June. Had a lot of shortness of breath while in rehab at her neurosurgery office for followup of back pain. Pools liquid in her mouth. Denies easy choking with swallowing; denies smothering when lying down at night.  Continues allergy vaccine. This week using rescue inhaler several times/ day- does help. Advair 250. Finishes prednisone taper in 3 more days- it did help a lot CT chest 11/23/10- RULobectomy, emphysema, NAD.  Plan 6 MWT next visit.   01/24/12-  78 year old female former smoker (100 pack years ) followed for asthma/COPD, allergic rhinitis, chronic sinusitis, remote history lung cancer Has had nerve blocks in cervical spine to relieve occipital pain. No respiratory complaints associated with that. Breathing overall is better. Continues allergy vaccine here at 1:10 without problem. 6 minute walk test today was reviewed with her: 97%, 98%, 97%. Walk only 168 m. Stopped at 3 minutes complaining of dizziness and weakness but able to restart after a short rest. Impression: Limited exercise tolerance with complaints of dizziness and weakness not reflected in blood pressure, heart rate or oxygen levels.  07/28/12- 78 year old female former smoker (100 pack years ) followed for asthma/COPD, allergic rhinitis, chronic sinusitis, remote history lung cancer Still on vaccine 1:10 GH and doing pretty well; ragweed is bothersome at this time. But not much nasal congestion or nose blowing. Mild cough when her lawn is mowed. Being followed by oncology for left upper lobe nodule with CT scan next scheduled June 2014. She feels she is better off continuing Advair 500. Still having  pains in the face and head she associates with pinched nerves in her neck CT chest 04/24/12-we reviewed images IMPRESSION:  1. Status post right upper lobectomy without definite evidence of  local recurrence of disease or new metastatic disease in the  thorax.  2. However, there is a new 4 mm left upper lobe nodule (image 16  of series 5) which is highly nonspecific. However, attention on a  follow-up CT scan in 1 year is recommended. This recommendation  follows the consensus statement: Guidelines for Management of Small  Pulmonary Nodules Detected on CT Scans: A Statement from the  Fleischner Society as published in Radiology 2005; 237:395-400.  3. Atherosclerosis, including left anterior descending and right  coronary artery disease. Please note that although the presence of  coronary artery calcium documents the presence of coronary artery  disease, the severity of this disease and any potential stenosis  cannot be assessed on this non-gated CT examination. Assessment  for potential risk factor modification, dietary therapy or  pharmacologic therapy may be warranted, if clinically indicated.  Original Report Authenticated By: Florencia Reasons, M.D.   Review of Systems-see HPI Constitutional:   No-   weight loss, night sweats, fevers, chills, fatigue, lassitude. HEENT:   + headaches, no-difficulty swallowing, tooth/dental problems, sore throat,       No-  sneezing, itching, ear ache, nasal congestion, post nasal drip,  CV:  No-   chest pain, orthopnea, PND, swelling in lower extremities, anasarca, dizziness, palpitations Resp: +   shortness of breath with exertion or at rest.  No-   productive cough,  +non-productive cough,  No-  coughing up of blood.              No-   change in color of mucus.  No- wheezing.   Skin: No-   rash or lesions. GI:  No-   heartburn, indigestion, abdominal pain, nausea, vomiting,  GU:   MS:  No-   joint pain or swelling.  .  + back  pain. Neuro- weak and dizzy with exertion Psych:  No- change in mood or affect. No depression or anxiety.  No memory loss.  Objective:   Physical Exam General- Alert, Oriented, Affect-appropriate, Distress- tense facies Skin- rash-none, lesions- none, excoriation- none Lymphadenopathy- none Head- atraumatic            Eyes- Gross vision intact, PERRLA, conjunctivae clear secretions            Ears- Hearing, canals normal            Nose- Clear, No-Septal dev, mucus, polyps, erosion, perforation             Throat- Mallampati III , mucosa clear , drainage- none, tonsils- atrophic Neck- flexible , trachea midline, no stridor , thyroid nl, carotid no bruit Chest - symmetrical excursion , unlabored           Heart/CV- RRR , no murmur , no gallop  , no rub, nl s1 s2  Pulse is normal and regular.                            JVD- none , edema- none, stasis changes- none, varices- none           Lung- clear to P&A, wheeze- none, cough- none , dullness-none, rub- none   There is no wheeze, unlabored.           Chest wall-  Abd-  Br/ Gen/ Rectal- Not done, not indicated Extrem- cyanosis- none, clubbing, none, atrophy- none, strength- nl Neuro- right facial tic

## 2012-08-03 ENCOUNTER — Ambulatory Visit (INDEPENDENT_AMBULATORY_CARE_PROVIDER_SITE_OTHER): Payer: Medicare Other

## 2012-08-03 DIAGNOSIS — J309 Allergic rhinitis, unspecified: Secondary | ICD-10-CM

## 2012-08-05 DIAGNOSIS — M545 Low back pain, unspecified: Secondary | ICD-10-CM | POA: Diagnosis not present

## 2012-08-05 DIAGNOSIS — M542 Cervicalgia: Secondary | ICD-10-CM | POA: Diagnosis not present

## 2012-08-05 NOTE — Assessment & Plan Note (Signed)
Okay to continue allergy vaccine. She feels adequately controlled. We discussed antihistamines and nose sprays. She

## 2012-08-05 NOTE — Assessment & Plan Note (Signed)
She feels better but she continues Advair 500 which we discussed.

## 2012-08-05 NOTE — Assessment & Plan Note (Signed)
  Nodule on latest chest CT with followup being managed by oncology

## 2012-08-10 ENCOUNTER — Ambulatory Visit (INDEPENDENT_AMBULATORY_CARE_PROVIDER_SITE_OTHER): Payer: Medicare Other

## 2012-08-10 DIAGNOSIS — J309 Allergic rhinitis, unspecified: Secondary | ICD-10-CM

## 2012-08-18 ENCOUNTER — Ambulatory Visit (INDEPENDENT_AMBULATORY_CARE_PROVIDER_SITE_OTHER): Payer: Medicare Other

## 2012-08-18 DIAGNOSIS — J309 Allergic rhinitis, unspecified: Secondary | ICD-10-CM | POA: Diagnosis not present

## 2012-08-24 ENCOUNTER — Ambulatory Visit (INDEPENDENT_AMBULATORY_CARE_PROVIDER_SITE_OTHER): Payer: Medicare Other

## 2012-08-24 DIAGNOSIS — J309 Allergic rhinitis, unspecified: Secondary | ICD-10-CM

## 2012-08-25 DIAGNOSIS — H1045 Other chronic allergic conjunctivitis: Secondary | ICD-10-CM | POA: Diagnosis not present

## 2012-08-25 DIAGNOSIS — H04129 Dry eye syndrome of unspecified lacrimal gland: Secondary | ICD-10-CM | POA: Diagnosis not present

## 2012-09-01 ENCOUNTER — Ambulatory Visit (INDEPENDENT_AMBULATORY_CARE_PROVIDER_SITE_OTHER): Payer: Medicare Other

## 2012-09-01 DIAGNOSIS — J309 Allergic rhinitis, unspecified: Secondary | ICD-10-CM | POA: Diagnosis not present

## 2012-09-02 ENCOUNTER — Ambulatory Visit (INDEPENDENT_AMBULATORY_CARE_PROVIDER_SITE_OTHER): Payer: Medicare Other

## 2012-09-02 DIAGNOSIS — J309 Allergic rhinitis, unspecified: Secondary | ICD-10-CM

## 2012-09-08 ENCOUNTER — Encounter (HOSPITAL_COMMUNITY): Payer: Self-pay | Admitting: *Deleted

## 2012-09-08 ENCOUNTER — Emergency Department (HOSPITAL_COMMUNITY)
Admission: EM | Admit: 2012-09-08 | Discharge: 2012-09-08 | Disposition: A | Discharge status: Home / Self Care | Payer: Medicare Other | Attending: Emergency Medicine | Admitting: Emergency Medicine

## 2012-09-08 ENCOUNTER — Emergency Department (HOSPITAL_COMMUNITY): Payer: Medicare Other

## 2012-09-08 DIAGNOSIS — R079 Chest pain, unspecified: Secondary | ICD-10-CM | POA: Diagnosis not present

## 2012-09-08 DIAGNOSIS — J309 Allergic rhinitis, unspecified: Secondary | ICD-10-CM | POA: Diagnosis not present

## 2012-09-08 DIAGNOSIS — R109 Unspecified abdominal pain: Secondary | ICD-10-CM | POA: Diagnosis not present

## 2012-09-08 DIAGNOSIS — W010XXA Fall on same level from slipping, tripping and stumbling without subsequent striking against object, initial encounter: Secondary | ICD-10-CM | POA: Insufficient documentation

## 2012-09-08 DIAGNOSIS — Z79899 Other long term (current) drug therapy: Secondary | ICD-10-CM | POA: Diagnosis not present

## 2012-09-08 DIAGNOSIS — J449 Chronic obstructive pulmonary disease, unspecified: Secondary | ICD-10-CM | POA: Insufficient documentation

## 2012-09-08 DIAGNOSIS — Z87891 Personal history of nicotine dependence: Secondary | ICD-10-CM | POA: Insufficient documentation

## 2012-09-08 DIAGNOSIS — F17200 Nicotine dependence, unspecified, uncomplicated: Secondary | ICD-10-CM | POA: Diagnosis not present

## 2012-09-08 DIAGNOSIS — J4489 Other specified chronic obstructive pulmonary disease: Secondary | ICD-10-CM | POA: Insufficient documentation

## 2012-09-08 DIAGNOSIS — Z85118 Personal history of other malignant neoplasm of bronchus and lung: Secondary | ICD-10-CM | POA: Diagnosis not present

## 2012-09-08 DIAGNOSIS — S20219A Contusion of unspecified front wall of thorax, initial encounter: Secondary | ICD-10-CM | POA: Diagnosis not present

## 2012-09-08 DIAGNOSIS — Y9389 Activity, other specified: Secondary | ICD-10-CM | POA: Insufficient documentation

## 2012-09-08 DIAGNOSIS — R52 Pain, unspecified: Secondary | ICD-10-CM | POA: Diagnosis not present

## 2012-09-08 DIAGNOSIS — W19XXXA Unspecified fall, initial encounter: Secondary | ICD-10-CM

## 2012-09-08 DIAGNOSIS — S298XXA Other specified injuries of thorax, initial encounter: Secondary | ICD-10-CM | POA: Insufficient documentation

## 2012-09-08 DIAGNOSIS — Y929 Unspecified place or not applicable: Secondary | ICD-10-CM | POA: Insufficient documentation

## 2012-09-08 DIAGNOSIS — W1809XA Striking against other object with subsequent fall, initial encounter: Secondary | ICD-10-CM | POA: Insufficient documentation

## 2012-09-08 MED ORDER — HYDROCODONE-ACETAMINOPHEN 5-325 MG PO TABS
2.0000 | ORAL_TABLET | Freq: Once | ORAL | Status: AC
  Administered 2012-09-08: 2 via ORAL
  Filled 2012-09-08: qty 2

## 2012-09-08 MED ORDER — HYDROCODONE-ACETAMINOPHEN 5-500 MG PO TABS: 1.0000 | ORAL_TABLET | Freq: Four times a day (QID) | ORAL | Status: DC | PRN

## 2012-09-08 NOTE — ED Notes (Signed)
[facility] contacted for transport back to home. 

## 2012-09-08 NOTE — ED Provider Notes (Signed)
History     CSN: [id number]  Arrival date & time 09/08/12  1101   First MD Initiated Contact with Patient 09/08/12 1108      Chief Complaint  Patient presents with  . Fall    (Consider location/radiation/quality/duration/timing/severity/associated sxs/prior treatment) Patient is a 78 y.o. female presenting with fall. The history is provided by the patient.  Fall Pertinent negatives include no fever, no abdominal pain and no headaches.  s/p fall at home last pm. States was trying to put pajama bottoms on while standing, without holding onto anything, when lost balance/tripped and fell hitting left side/ribs on toilet. Denies loc. No head injury or headache. Was able to get self up. C/o constant, mod-sev pain to left lateral ribs/chest wall, non radiating, worse w turning,palpation area. No sob. No neck or back pain. Denies other pain or injury.   Past Medical History  Diagnosis Date  . Cancer     lung ca  . Chronic obstructive asthma, unspecified   . Malignant neoplasm of bronchus and lung, unspecified site   . Allergic rhinitis, cause unspecified   . Angioneurotic edema not elsewhere classified     Past Surgical History  Procedure Date  . Right upper lobectomy     Family History  Problem Relation Age of Onset  . Asthma    . Alzheimer's disease Mother   . Stroke Mother     History  Substance Use Topics  . Smoking status: Former Smoker -- 2.0 packs/day for 50 years    Types: Cigarettes  . Smokeless tobacco: Not on file  . Alcohol Use: No    OB History    Grav Para Term Preterm Abortions TAB SAB Ect Mult Living                  Review of Systems  Constitutional: Negative for fever and chills.  HENT: Negative for neck pain.   Eyes: Negative for pain.  Respiratory: Negative for shortness of breath.   Cardiovascular: Positive for chest pain.  Gastrointestinal: Negative for abdominal pain.  Genitourinary: Negative for flank pain.  Musculoskeletal: Negative for  back pain.  Skin: Negative for rash.  Neurological: Negative for headaches.  Hematological: Does not bruise/bleed easily.  Psychiatric/Behavioral: Negative for confusion.    Allergies  Aspirin and Penicillins  Home Medications   Current Outpatient Rx  Name  Route  Sig  Dispense  Refill  . ACETAMINOPHEN 500 MG PO TABS   Oral   Take 500 mg by mouth every 6 (six) hours as needed.           Marland Kitchen ADVAIR DISKUS 500-50 MCG/DOSE IN AEPB      INAHLE 1 PUFF AND RINSE WELL, TWICE DAILY AS DIRECTED   180 each   3   . ALBUTEROL SULFATE HFA 108 (90 BASE) MCG/ACT IN AERS   Inhalation   Inhale 2 puffs into the lungs 4 (four) times daily as needed.   3 Inhaler   2   . CALCIUM CARBONATE 600 MG PO TABS   Oral   Take 600 mg by mouth daily.         Marland Kitchen DIAZEPAM 5 MG PO TABS   Oral   Take 2.5 mg by mouth at bedtime as needed.           . ERGOCALCIFEROL 50000 UNITS PO CAPS   Oral   Take 50,000 Units by mouth every 30 (thirty) days.           Marland Kitchen FAMOTIDINE  20 MG PO TABS   Oral   Take 20 mg by mouth daily.           Marland Kitchen HYDROCODONE-ACETAMINOPHEN 5-500 MG PO TABS   Oral   Take 1 tablet by mouth at bedtime as needed.           Marland Kitchen LORATADINE-PSEUDOEPHEDRINE ER 10-240 MG PO TB24   Oral   Take 1 tablet by mouth daily.           . MELOXICAM 15 MG PO TABS      Take 1/2 to 1 tablet by mouth as needed          . METHOCARBAMOL 500 MG PO TABS   Oral   Take 1 tablet by mouth 4 times daily.         Marland Kitchen METOPROLOL SUCCINATE ER 50 MG PO TB24   Oral   Take 50 mg by mouth daily.           . MOMETASONE FUROATE 50 MCG/ACT NA SUSP      1-2 sprays each nostril daily   51 g   3   . FISH OIL 1000 MG PO CAPS   Oral   Take 1 capsule by mouth 2 (two) times daily.             BP 173/57  Pulse 71  Temp 97.8 F (36.6 C) (Oral)  Resp 16  SpO2 99%  Physical Exam  Nursing note and vitals reviewed. Constitutional: She is oriented to person, place, and time. She appears  well-developed and well-nourished. No distress.  HENT:  Head: Atraumatic.  Mouth/Throat: Oropharynx is clear and moist.  Eyes: Conjunctivae normal are normal. Pupils are equal, round, and reactive to light. No scleral icterus.  Neck: Normal range of motion. Neck supple. No tracheal deviation present.  Cardiovascular: Normal rate, regular rhythm, normal heart sounds and intact distal pulses.   Pulmonary/Chest: Effort normal and breath sounds normal. No respiratory distress. She exhibits tenderness.       Marked tenderness left lateral chest wall/ribs, no crepitus.   Abdominal: Soft. Normal appearance and bowel sounds are normal. She exhibits no distension. There is no tenderness.       No abd wall contusion or bruising noted.   Musculoskeletal: She exhibits no edema.       Good rom bil extremities without pain or local bony tenderness.  CTLS spine, non tender, aligned, no step off.   Neurological: She is alert and oriented to person, place, and time.       Motor intact bil.   Skin: Skin is warm and dry. No rash noted.  Psychiatric: She has a normal mood and affect.    ED Course  Procedures (including critical care time)  Dg Ribs Unilateral W/chest Left  09/08/2012  *RADIOLOGY REPORT*  Clinical Data: Posterior left lower rib pain post fall  LEFT RIBS AND CHEST - 3+ VIEW  Comparison: Chest radiograph 07/16/2010  Findings: Upper-normal size of cardiac silhouette. Tortuous aorta with atherosclerotic calcification. Mediastinal contours and pulmonary vascularity otherwise normal. Bibasilar scarring and underlying emphysematous changes. No definite infiltrate, pleural effusion, or pneumothorax. Diffuse osseous demineralization. No definite rib fracture or bone destruction identified though the posterior inferior ribs are suboptimally assessed due to degree of demineralization and superimposed soft tissue density.  IMPRESSION: No definite acute rib abnormalities. Chronic lung changes.   Original  Report Authenticated By: Ulyses Southward, M.D.        MDM  Xrays.  Reviewed nursing notes  and prior charts for additional history.   Incentive spirometer teaching by resp therapy, and provided for home use.  vicodin po.  Recheck spine nt. abd soft nt.  Pt appears stable for d/c.            Suzi Roots, MD 09/08/12 1236

## 2012-09-08 NOTE — ED Notes (Signed)
Bed:WA04<BR> Expected date:<BR> Expected time:<BR> Means of arrival:<BR> Comments:<BR> fall

## 2012-09-08 NOTE — ED Notes (Signed)
Per EMS, pt from home, reports fall x 12 hours ago, was trying to take her shoes off in the BR-lost her balance and fell-landed on her L side/rib.  Tender on her L side.  No bruising noted per EMS.  Pt is A&Ox 4.

## 2012-09-08 NOTE — Progress Notes (Signed)
Late entry for 09/08/12 1315 ED RN, Byrd Hesselbach consulted pt for concern about living alone. Pt reports living alone but has life alert services through [employer].  Pt also will check with [employer] services for possible life alert services Reports her neighbor will be picking her up for d/c home.  CM review differences in tasks and coverage for home health and private duty services Pt informed CM she drives and is not home bound (because of this pt is not a candidate for home health services) Pt reports being able to take her own medications, provide food and feed herself and complete her own ADLs Reports having raised commode toilet seat Reports fall from commode occuring "last night" Reports difficulty breathing but cm noted O2 sat on monitor at 98% Pt denies use of oxygen at home.  Pt informed Cm the EDP informed her she had "no broken bones but I feel bruised inside"  CM provided a list of private duty nursing services for pt She is aware PDN not covered by medicare Pt reports she has a granddaughter studying to become a cna Pt stated she was not interested in assisted living nor skilled nursing facilities.  CM updated the ED RN

## 2012-09-15 DIAGNOSIS — R079 Chest pain, unspecified: Secondary | ICD-10-CM | POA: Diagnosis not present

## 2012-09-15 DIAGNOSIS — L02419 Cutaneous abscess of limb, unspecified: Secondary | ICD-10-CM | POA: Diagnosis not present

## 2012-09-15 DIAGNOSIS — L03119 Cellulitis of unspecified part of limb: Secondary | ICD-10-CM | POA: Diagnosis not present

## 2012-09-21 ENCOUNTER — Telehealth: Payer: Self-pay | Admitting: Internal Medicine

## 2012-09-21 NOTE — Telephone Encounter (Signed)
I spoke with pt and she stated she has not had her allergy shot in 3 weeks. She had a fall and not getting around to well. She stated she may possibly be able to come in this week for her allergy shot. Will forward to [employer] so she is aware.

## 2012-09-21 NOTE — Telephone Encounter (Signed)
Aware. I called [name] [name] to let her know I would come to her car and give her her shot since she's not very mobile. I gave her my # to call me when she's up to coming.

## 2012-09-24 DIAGNOSIS — L02419 Cutaneous abscess of limb, unspecified: Secondary | ICD-10-CM | POA: Diagnosis not present

## 2012-09-26 DIAGNOSIS — M79609 Pain in unspecified limb: Secondary | ICD-10-CM | POA: Diagnosis not present

## 2012-09-26 DIAGNOSIS — J449 Chronic obstructive pulmonary disease, unspecified: Secondary | ICD-10-CM | POA: Diagnosis not present

## 2012-09-26 DIAGNOSIS — I10 Essential (primary) hypertension: Secondary | ICD-10-CM | POA: Diagnosis not present

## 2012-09-26 DIAGNOSIS — D491 Neoplasm of unspecified behavior of respiratory system: Secondary | ICD-10-CM | POA: Diagnosis not present

## 2012-09-26 DIAGNOSIS — L02419 Cutaneous abscess of limb, unspecified: Secondary | ICD-10-CM | POA: Diagnosis not present

## 2012-09-26 DIAGNOSIS — M810 Age-related osteoporosis without current pathological fracture: Secondary | ICD-10-CM | POA: Diagnosis not present

## 2012-09-26 DIAGNOSIS — L03119 Cellulitis of unspecified part of limb: Secondary | ICD-10-CM | POA: Diagnosis not present

## 2012-09-28 DIAGNOSIS — M810 Age-related osteoporosis without current pathological fracture: Secondary | ICD-10-CM | POA: Diagnosis not present

## 2012-09-28 DIAGNOSIS — L02419 Cutaneous abscess of limb, unspecified: Secondary | ICD-10-CM | POA: Diagnosis not present

## 2012-09-28 DIAGNOSIS — I10 Essential (primary) hypertension: Secondary | ICD-10-CM | POA: Diagnosis not present

## 2012-09-28 DIAGNOSIS — D491 Neoplasm of unspecified behavior of respiratory system: Secondary | ICD-10-CM | POA: Diagnosis not present

## 2012-09-28 DIAGNOSIS — M79609 Pain in unspecified limb: Secondary | ICD-10-CM | POA: Diagnosis not present

## 2012-09-28 DIAGNOSIS — J449 Chronic obstructive pulmonary disease, unspecified: Secondary | ICD-10-CM | POA: Diagnosis not present

## 2012-09-29 DIAGNOSIS — I10 Essential (primary) hypertension: Secondary | ICD-10-CM | POA: Diagnosis not present

## 2012-09-29 DIAGNOSIS — L02419 Cutaneous abscess of limb, unspecified: Secondary | ICD-10-CM | POA: Diagnosis not present

## 2012-09-29 DIAGNOSIS — M79609 Pain in unspecified limb: Secondary | ICD-10-CM | POA: Diagnosis not present

## 2012-09-29 DIAGNOSIS — J449 Chronic obstructive pulmonary disease, unspecified: Secondary | ICD-10-CM | POA: Diagnosis not present

## 2012-09-29 DIAGNOSIS — M810 Age-related osteoporosis without current pathological fracture: Secondary | ICD-10-CM | POA: Diagnosis not present

## 2012-09-29 DIAGNOSIS — D491 Neoplasm of unspecified behavior of respiratory system: Secondary | ICD-10-CM | POA: Diagnosis not present

## 2012-09-30 DIAGNOSIS — J449 Chronic obstructive pulmonary disease, unspecified: Secondary | ICD-10-CM | POA: Diagnosis not present

## 2012-09-30 DIAGNOSIS — D491 Neoplasm of unspecified behavior of respiratory system: Secondary | ICD-10-CM | POA: Diagnosis not present

## 2012-09-30 DIAGNOSIS — L02419 Cutaneous abscess of limb, unspecified: Secondary | ICD-10-CM | POA: Diagnosis not present

## 2012-09-30 DIAGNOSIS — M79609 Pain in unspecified limb: Secondary | ICD-10-CM | POA: Diagnosis not present

## 2012-09-30 DIAGNOSIS — M810 Age-related osteoporosis without current pathological fracture: Secondary | ICD-10-CM | POA: Diagnosis not present

## 2012-09-30 DIAGNOSIS — I10 Essential (primary) hypertension: Secondary | ICD-10-CM | POA: Diagnosis not present

## 2012-10-03 DIAGNOSIS — I10 Essential (primary) hypertension: Secondary | ICD-10-CM | POA: Diagnosis not present

## 2012-10-03 DIAGNOSIS — J449 Chronic obstructive pulmonary disease, unspecified: Secondary | ICD-10-CM | POA: Diagnosis not present

## 2012-10-03 DIAGNOSIS — D491 Neoplasm of unspecified behavior of respiratory system: Secondary | ICD-10-CM | POA: Diagnosis not present

## 2012-10-03 DIAGNOSIS — M810 Age-related osteoporosis without current pathological fracture: Secondary | ICD-10-CM | POA: Diagnosis not present

## 2012-10-03 DIAGNOSIS — M79609 Pain in unspecified limb: Secondary | ICD-10-CM | POA: Diagnosis not present

## 2012-10-03 DIAGNOSIS — L03119 Cellulitis of unspecified part of limb: Secondary | ICD-10-CM | POA: Diagnosis not present

## 2012-10-06 DIAGNOSIS — M810 Age-related osteoporosis without current pathological fracture: Secondary | ICD-10-CM | POA: Diagnosis not present

## 2012-10-06 DIAGNOSIS — J449 Chronic obstructive pulmonary disease, unspecified: Secondary | ICD-10-CM | POA: Diagnosis not present

## 2012-10-06 DIAGNOSIS — M79609 Pain in unspecified limb: Secondary | ICD-10-CM | POA: Diagnosis not present

## 2012-10-06 DIAGNOSIS — D491 Neoplasm of unspecified behavior of respiratory system: Secondary | ICD-10-CM | POA: Diagnosis not present

## 2012-10-06 DIAGNOSIS — L03119 Cellulitis of unspecified part of limb: Secondary | ICD-10-CM | POA: Diagnosis not present

## 2012-10-06 DIAGNOSIS — I10 Essential (primary) hypertension: Secondary | ICD-10-CM | POA: Diagnosis not present

## 2012-10-09 DIAGNOSIS — I10 Essential (primary) hypertension: Secondary | ICD-10-CM | POA: Diagnosis not present

## 2012-10-09 DIAGNOSIS — M810 Age-related osteoporosis without current pathological fracture: Secondary | ICD-10-CM | POA: Diagnosis not present

## 2012-10-09 DIAGNOSIS — M79609 Pain in unspecified limb: Secondary | ICD-10-CM | POA: Diagnosis not present

## 2012-10-09 DIAGNOSIS — D491 Neoplasm of unspecified behavior of respiratory system: Secondary | ICD-10-CM | POA: Diagnosis not present

## 2012-10-09 DIAGNOSIS — L02419 Cutaneous abscess of limb, unspecified: Secondary | ICD-10-CM | POA: Diagnosis not present

## 2012-10-09 DIAGNOSIS — L03119 Cellulitis of unspecified part of limb: Secondary | ICD-10-CM | POA: Diagnosis not present

## 2012-10-09 DIAGNOSIS — J449 Chronic obstructive pulmonary disease, unspecified: Secondary | ICD-10-CM | POA: Diagnosis not present

## 2012-10-13 ENCOUNTER — Telehealth: Payer: Self-pay | Admitting: Internal Medicine

## 2012-10-13 DIAGNOSIS — I10 Essential (primary) hypertension: Secondary | ICD-10-CM | POA: Diagnosis not present

## 2012-10-13 DIAGNOSIS — M810 Age-related osteoporosis without current pathological fracture: Secondary | ICD-10-CM | POA: Diagnosis not present

## 2012-10-13 DIAGNOSIS — M79609 Pain in unspecified limb: Secondary | ICD-10-CM | POA: Diagnosis not present

## 2012-10-13 DIAGNOSIS — L02419 Cutaneous abscess of limb, unspecified: Secondary | ICD-10-CM | POA: Diagnosis not present

## 2012-10-13 DIAGNOSIS — J449 Chronic obstructive pulmonary disease, unspecified: Secondary | ICD-10-CM | POA: Diagnosis not present

## 2012-10-13 DIAGNOSIS — D491 Neoplasm of unspecified behavior of respiratory system: Secondary | ICD-10-CM | POA: Diagnosis not present

## 2012-10-13 NOTE — Telephone Encounter (Signed)
Called pt. and gave her Dr.Young's advice. She understood and will be in next wk.to restart her shots. [name] [name] fell in the bathroom ,broke a rib and has cellulitis in or on one leg.

## 2012-10-13 NOTE — Telephone Encounter (Signed)
Spoke with patient she states she has not received allergy shot in 5 weeks, states she has been doing well and would like to know if she needs to come in for an OV or if she can just come in and get her shot.  Dr. Maple Hudson please advise, thank you

## 2012-10-15 DIAGNOSIS — L03119 Cellulitis of unspecified part of limb: Secondary | ICD-10-CM | POA: Diagnosis not present

## 2012-10-15 DIAGNOSIS — M79609 Pain in unspecified limb: Secondary | ICD-10-CM | POA: Diagnosis not present

## 2012-10-15 DIAGNOSIS — M810 Age-related osteoporosis without current pathological fracture: Secondary | ICD-10-CM | POA: Diagnosis not present

## 2012-10-15 DIAGNOSIS — I10 Essential (primary) hypertension: Secondary | ICD-10-CM | POA: Diagnosis not present

## 2012-10-15 DIAGNOSIS — D491 Neoplasm of unspecified behavior of respiratory system: Secondary | ICD-10-CM | POA: Diagnosis not present

## 2012-10-15 DIAGNOSIS — J449 Chronic obstructive pulmonary disease, unspecified: Secondary | ICD-10-CM | POA: Diagnosis not present

## 2012-10-19 ENCOUNTER — Ambulatory Visit (INDEPENDENT_AMBULATORY_CARE_PROVIDER_SITE_OTHER): Payer: Medicare Other

## 2012-10-19 DIAGNOSIS — J309 Allergic rhinitis, unspecified: Secondary | ICD-10-CM | POA: Diagnosis not present

## 2012-10-20 DIAGNOSIS — D491 Neoplasm of unspecified behavior of respiratory system: Secondary | ICD-10-CM | POA: Diagnosis not present

## 2012-10-20 DIAGNOSIS — L03119 Cellulitis of unspecified part of limb: Secondary | ICD-10-CM | POA: Diagnosis not present

## 2012-10-20 DIAGNOSIS — M79609 Pain in unspecified limb: Secondary | ICD-10-CM | POA: Diagnosis not present

## 2012-10-20 DIAGNOSIS — M810 Age-related osteoporosis without current pathological fracture: Secondary | ICD-10-CM | POA: Diagnosis not present

## 2012-10-20 DIAGNOSIS — I10 Essential (primary) hypertension: Secondary | ICD-10-CM | POA: Diagnosis not present

## 2012-10-20 DIAGNOSIS — J449 Chronic obstructive pulmonary disease, unspecified: Secondary | ICD-10-CM | POA: Diagnosis not present

## 2012-10-23 DIAGNOSIS — I10 Essential (primary) hypertension: Secondary | ICD-10-CM | POA: Diagnosis not present

## 2012-10-23 DIAGNOSIS — J449 Chronic obstructive pulmonary disease, unspecified: Secondary | ICD-10-CM | POA: Diagnosis not present

## 2012-10-23 DIAGNOSIS — M79609 Pain in unspecified limb: Secondary | ICD-10-CM | POA: Diagnosis not present

## 2012-10-23 DIAGNOSIS — L02419 Cutaneous abscess of limb, unspecified: Secondary | ICD-10-CM | POA: Diagnosis not present

## 2012-10-23 DIAGNOSIS — M810 Age-related osteoporosis without current pathological fracture: Secondary | ICD-10-CM | POA: Diagnosis not present

## 2012-10-23 DIAGNOSIS — D491 Neoplasm of unspecified behavior of respiratory system: Secondary | ICD-10-CM | POA: Diagnosis not present

## 2012-10-26 ENCOUNTER — Ambulatory Visit (INDEPENDENT_AMBULATORY_CARE_PROVIDER_SITE_OTHER): Payer: Medicare Other

## 2012-10-26 DIAGNOSIS — J309 Allergic rhinitis, unspecified: Secondary | ICD-10-CM | POA: Diagnosis not present

## 2012-10-27 DIAGNOSIS — M79609 Pain in unspecified limb: Secondary | ICD-10-CM | POA: Diagnosis not present

## 2012-10-27 DIAGNOSIS — L02419 Cutaneous abscess of limb, unspecified: Secondary | ICD-10-CM | POA: Diagnosis not present

## 2012-10-27 DIAGNOSIS — I10 Essential (primary) hypertension: Secondary | ICD-10-CM | POA: Diagnosis not present

## 2012-10-27 DIAGNOSIS — D491 Neoplasm of unspecified behavior of respiratory system: Secondary | ICD-10-CM | POA: Diagnosis not present

## 2012-10-27 DIAGNOSIS — M810 Age-related osteoporosis without current pathological fracture: Secondary | ICD-10-CM | POA: Diagnosis not present

## 2012-10-27 DIAGNOSIS — J449 Chronic obstructive pulmonary disease, unspecified: Secondary | ICD-10-CM | POA: Diagnosis not present

## 2012-10-29 ENCOUNTER — Other Ambulatory Visit: Payer: Self-pay | Admitting: *Deleted

## 2012-10-29 DIAGNOSIS — D491 Neoplasm of unspecified behavior of respiratory system: Secondary | ICD-10-CM | POA: Diagnosis not present

## 2012-10-29 DIAGNOSIS — L03119 Cellulitis of unspecified part of limb: Secondary | ICD-10-CM | POA: Diagnosis not present

## 2012-10-29 DIAGNOSIS — M79609 Pain in unspecified limb: Secondary | ICD-10-CM | POA: Diagnosis not present

## 2012-10-29 DIAGNOSIS — I10 Essential (primary) hypertension: Secondary | ICD-10-CM | POA: Diagnosis not present

## 2012-10-29 DIAGNOSIS — M810 Age-related osteoporosis without current pathological fracture: Secondary | ICD-10-CM | POA: Diagnosis not present

## 2012-10-29 DIAGNOSIS — J449 Chronic obstructive pulmonary disease, unspecified: Secondary | ICD-10-CM | POA: Diagnosis not present

## 2012-10-29 MED ORDER — FLUTICASONE-SALMETEROL 500-50 MCG/DOSE IN AEPB: 1.0000 | INHALATION_SPRAY | Freq: Two times a day (BID) | RESPIRATORY_TRACT | Status: DC

## 2012-11-03 ENCOUNTER — Encounter: Payer: Self-pay | Admitting: Internal Medicine

## 2012-11-04 DIAGNOSIS — M79609 Pain in unspecified limb: Secondary | ICD-10-CM | POA: Diagnosis not present

## 2012-11-04 DIAGNOSIS — J449 Chronic obstructive pulmonary disease, unspecified: Secondary | ICD-10-CM | POA: Diagnosis not present

## 2012-11-04 DIAGNOSIS — D491 Neoplasm of unspecified behavior of respiratory system: Secondary | ICD-10-CM | POA: Diagnosis not present

## 2012-11-04 DIAGNOSIS — I10 Essential (primary) hypertension: Secondary | ICD-10-CM | POA: Diagnosis not present

## 2012-11-04 DIAGNOSIS — M810 Age-related osteoporosis without current pathological fracture: Secondary | ICD-10-CM | POA: Diagnosis not present

## 2012-11-04 DIAGNOSIS — L02419 Cutaneous abscess of limb, unspecified: Secondary | ICD-10-CM | POA: Diagnosis not present

## 2012-11-05 ENCOUNTER — Ambulatory Visit (INDEPENDENT_AMBULATORY_CARE_PROVIDER_SITE_OTHER): Payer: Medicare Other

## 2012-11-05 DIAGNOSIS — I10 Essential (primary) hypertension: Secondary | ICD-10-CM | POA: Diagnosis not present

## 2012-11-05 DIAGNOSIS — M79609 Pain in unspecified limb: Secondary | ICD-10-CM | POA: Diagnosis not present

## 2012-11-05 DIAGNOSIS — D491 Neoplasm of unspecified behavior of respiratory system: Secondary | ICD-10-CM | POA: Diagnosis not present

## 2012-11-05 DIAGNOSIS — L02419 Cutaneous abscess of limb, unspecified: Secondary | ICD-10-CM | POA: Diagnosis not present

## 2012-11-05 DIAGNOSIS — J449 Chronic obstructive pulmonary disease, unspecified: Secondary | ICD-10-CM | POA: Diagnosis not present

## 2012-11-05 DIAGNOSIS — J309 Allergic rhinitis, unspecified: Secondary | ICD-10-CM

## 2012-11-05 DIAGNOSIS — M810 Age-related osteoporosis without current pathological fracture: Secondary | ICD-10-CM | POA: Diagnosis not present

## 2012-11-09 ENCOUNTER — Ambulatory Visit (INDEPENDENT_AMBULATORY_CARE_PROVIDER_SITE_OTHER): Payer: Medicare Other

## 2012-11-09 DIAGNOSIS — M810 Age-related osteoporosis without current pathological fracture: Secondary | ICD-10-CM | POA: Diagnosis not present

## 2012-11-09 DIAGNOSIS — D491 Neoplasm of unspecified behavior of respiratory system: Secondary | ICD-10-CM | POA: Diagnosis not present

## 2012-11-09 DIAGNOSIS — J309 Allergic rhinitis, unspecified: Secondary | ICD-10-CM

## 2012-11-09 DIAGNOSIS — M79609 Pain in unspecified limb: Secondary | ICD-10-CM | POA: Diagnosis not present

## 2012-11-09 DIAGNOSIS — L02419 Cutaneous abscess of limb, unspecified: Secondary | ICD-10-CM | POA: Diagnosis not present

## 2012-11-09 DIAGNOSIS — J449 Chronic obstructive pulmonary disease, unspecified: Secondary | ICD-10-CM | POA: Diagnosis not present

## 2012-11-09 DIAGNOSIS — I10 Essential (primary) hypertension: Secondary | ICD-10-CM | POA: Diagnosis not present

## 2012-11-11 DIAGNOSIS — M810 Age-related osteoporosis without current pathological fracture: Secondary | ICD-10-CM | POA: Diagnosis not present

## 2012-11-11 DIAGNOSIS — M79609 Pain in unspecified limb: Secondary | ICD-10-CM | POA: Diagnosis not present

## 2012-11-11 DIAGNOSIS — J449 Chronic obstructive pulmonary disease, unspecified: Secondary | ICD-10-CM | POA: Diagnosis not present

## 2012-11-11 DIAGNOSIS — I10 Essential (primary) hypertension: Secondary | ICD-10-CM | POA: Diagnosis not present

## 2012-11-11 DIAGNOSIS — D491 Neoplasm of unspecified behavior of respiratory system: Secondary | ICD-10-CM | POA: Diagnosis not present

## 2012-11-11 DIAGNOSIS — L03119 Cellulitis of unspecified part of limb: Secondary | ICD-10-CM | POA: Diagnosis not present

## 2012-11-12 DIAGNOSIS — M810 Age-related osteoporosis without current pathological fracture: Secondary | ICD-10-CM | POA: Diagnosis not present

## 2012-11-12 DIAGNOSIS — Z1331 Encounter for screening for depression: Secondary | ICD-10-CM | POA: Diagnosis not present

## 2012-11-12 DIAGNOSIS — Z0000 Encounter for general adult medical examination without abnormal findings: Secondary | ICD-10-CM | POA: Diagnosis not present

## 2012-11-12 DIAGNOSIS — J449 Chronic obstructive pulmonary disease, unspecified: Secondary | ICD-10-CM | POA: Diagnosis not present

## 2012-11-12 DIAGNOSIS — E782 Mixed hyperlipidemia: Secondary | ICD-10-CM | POA: Diagnosis not present

## 2012-11-12 DIAGNOSIS — I10 Essential (primary) hypertension: Secondary | ICD-10-CM | POA: Diagnosis not present

## 2012-11-12 DIAGNOSIS — C3490 Malignant neoplasm of unspecified part of unspecified bronchus or lung: Secondary | ICD-10-CM | POA: Diagnosis not present

## 2012-11-12 DIAGNOSIS — R7309 Other abnormal glucose: Secondary | ICD-10-CM | POA: Diagnosis not present

## 2012-11-13 DIAGNOSIS — D491 Neoplasm of unspecified behavior of respiratory system: Secondary | ICD-10-CM | POA: Diagnosis not present

## 2012-11-13 DIAGNOSIS — M79609 Pain in unspecified limb: Secondary | ICD-10-CM | POA: Diagnosis not present

## 2012-11-13 DIAGNOSIS — J449 Chronic obstructive pulmonary disease, unspecified: Secondary | ICD-10-CM | POA: Diagnosis not present

## 2012-11-13 DIAGNOSIS — L02419 Cutaneous abscess of limb, unspecified: Secondary | ICD-10-CM | POA: Diagnosis not present

## 2012-11-13 DIAGNOSIS — I10 Essential (primary) hypertension: Secondary | ICD-10-CM | POA: Diagnosis not present

## 2012-11-13 DIAGNOSIS — M810 Age-related osteoporosis without current pathological fracture: Secondary | ICD-10-CM | POA: Diagnosis not present

## 2012-11-16 ENCOUNTER — Ambulatory Visit (INDEPENDENT_AMBULATORY_CARE_PROVIDER_SITE_OTHER): Payer: Medicare Other

## 2012-11-16 DIAGNOSIS — J309 Allergic rhinitis, unspecified: Secondary | ICD-10-CM | POA: Diagnosis not present

## 2012-11-17 DIAGNOSIS — M810 Age-related osteoporosis without current pathological fracture: Secondary | ICD-10-CM | POA: Diagnosis not present

## 2012-11-17 DIAGNOSIS — L02419 Cutaneous abscess of limb, unspecified: Secondary | ICD-10-CM | POA: Diagnosis not present

## 2012-11-17 DIAGNOSIS — M79609 Pain in unspecified limb: Secondary | ICD-10-CM | POA: Diagnosis not present

## 2012-11-17 DIAGNOSIS — I10 Essential (primary) hypertension: Secondary | ICD-10-CM | POA: Diagnosis not present

## 2012-11-17 DIAGNOSIS — J449 Chronic obstructive pulmonary disease, unspecified: Secondary | ICD-10-CM | POA: Diagnosis not present

## 2012-11-17 DIAGNOSIS — D491 Neoplasm of unspecified behavior of respiratory system: Secondary | ICD-10-CM | POA: Diagnosis not present

## 2012-11-19 DIAGNOSIS — M810 Age-related osteoporosis without current pathological fracture: Secondary | ICD-10-CM | POA: Diagnosis not present

## 2012-11-19 DIAGNOSIS — J449 Chronic obstructive pulmonary disease, unspecified: Secondary | ICD-10-CM | POA: Diagnosis not present

## 2012-11-19 DIAGNOSIS — L02419 Cutaneous abscess of limb, unspecified: Secondary | ICD-10-CM | POA: Diagnosis not present

## 2012-11-19 DIAGNOSIS — D491 Neoplasm of unspecified behavior of respiratory system: Secondary | ICD-10-CM | POA: Diagnosis not present

## 2012-11-19 DIAGNOSIS — M79609 Pain in unspecified limb: Secondary | ICD-10-CM | POA: Diagnosis not present

## 2012-11-19 DIAGNOSIS — I10 Essential (primary) hypertension: Secondary | ICD-10-CM | POA: Diagnosis not present

## 2012-11-23 ENCOUNTER — Ambulatory Visit (INDEPENDENT_AMBULATORY_CARE_PROVIDER_SITE_OTHER): Payer: Medicare Other

## 2012-11-23 DIAGNOSIS — J309 Allergic rhinitis, unspecified: Secondary | ICD-10-CM

## 2012-11-30 ENCOUNTER — Ambulatory Visit: Payer: Medicare Other

## 2012-11-30 ENCOUNTER — Ambulatory Visit (INDEPENDENT_AMBULATORY_CARE_PROVIDER_SITE_OTHER): Payer: Medicare Other

## 2012-11-30 DIAGNOSIS — J309 Allergic rhinitis, unspecified: Secondary | ICD-10-CM

## 2012-12-07 ENCOUNTER — Ambulatory Visit: Payer: Medicare Other

## 2012-12-07 ENCOUNTER — Ambulatory Visit (INDEPENDENT_AMBULATORY_CARE_PROVIDER_SITE_OTHER): Payer: Medicare Other

## 2012-12-07 DIAGNOSIS — J309 Allergic rhinitis, unspecified: Secondary | ICD-10-CM | POA: Diagnosis not present

## 2012-12-14 ENCOUNTER — Ambulatory Visit: Payer: Medicare Other

## 2012-12-14 ENCOUNTER — Ambulatory Visit (INDEPENDENT_AMBULATORY_CARE_PROVIDER_SITE_OTHER): Payer: Medicare Other

## 2012-12-14 DIAGNOSIS — J309 Allergic rhinitis, unspecified: Secondary | ICD-10-CM | POA: Diagnosis not present

## 2012-12-21 ENCOUNTER — Ambulatory Visit (INDEPENDENT_AMBULATORY_CARE_PROVIDER_SITE_OTHER): Payer: Medicare Other

## 2012-12-21 ENCOUNTER — Ambulatory Visit: Payer: Medicare Other

## 2012-12-21 DIAGNOSIS — J309 Allergic rhinitis, unspecified: Secondary | ICD-10-CM | POA: Diagnosis not present

## 2012-12-28 ENCOUNTER — Ambulatory Visit: Payer: Medicare Other

## 2012-12-28 ENCOUNTER — Ambulatory Visit (INDEPENDENT_AMBULATORY_CARE_PROVIDER_SITE_OTHER): Payer: Medicare Other

## 2012-12-28 DIAGNOSIS — J309 Allergic rhinitis, unspecified: Secondary | ICD-10-CM | POA: Diagnosis not present

## 2013-01-04 ENCOUNTER — Ambulatory Visit: Payer: Medicare Other

## 2013-01-06 ENCOUNTER — Ambulatory Visit (INDEPENDENT_AMBULATORY_CARE_PROVIDER_SITE_OTHER): Payer: Medicare Other

## 2013-01-06 DIAGNOSIS — J309 Allergic rhinitis, unspecified: Secondary | ICD-10-CM | POA: Diagnosis not present

## 2013-01-11 ENCOUNTER — Ambulatory Visit (INDEPENDENT_AMBULATORY_CARE_PROVIDER_SITE_OTHER): Payer: Medicare Other

## 2013-01-11 DIAGNOSIS — J309 Allergic rhinitis, unspecified: Secondary | ICD-10-CM | POA: Diagnosis not present

## 2013-01-18 ENCOUNTER — Ambulatory Visit: Payer: Medicare Other

## 2013-01-19 ENCOUNTER — Ambulatory Visit (INDEPENDENT_AMBULATORY_CARE_PROVIDER_SITE_OTHER): Payer: Medicare Other

## 2013-01-19 DIAGNOSIS — J309 Allergic rhinitis, unspecified: Secondary | ICD-10-CM

## 2013-01-25 ENCOUNTER — Encounter: Payer: Self-pay | Admitting: Internal Medicine

## 2013-01-25 ENCOUNTER — Ambulatory Visit (INDEPENDENT_AMBULATORY_CARE_PROVIDER_SITE_OTHER): Payer: Medicare Other

## 2013-01-25 ENCOUNTER — Ambulatory Visit (INDEPENDENT_AMBULATORY_CARE_PROVIDER_SITE_OTHER): Payer: Medicare Other | Admitting: Internal Medicine

## 2013-01-25 VITALS — BP 126/70 | HR 69 | Ht 63.0 in | Wt 161.0 lb

## 2013-01-25 DIAGNOSIS — J449 Chronic obstructive pulmonary disease, unspecified: Secondary | ICD-10-CM

## 2013-01-25 DIAGNOSIS — J301 Allergic rhinitis due to pollen: Secondary | ICD-10-CM

## 2013-01-25 DIAGNOSIS — J309 Allergic rhinitis, unspecified: Secondary | ICD-10-CM

## 2013-01-25 DIAGNOSIS — C3490 Malignant neoplasm of unspecified part of unspecified bronchus or lung: Secondary | ICD-10-CM

## 2013-01-25 DIAGNOSIS — J4489 Other specified chronic obstructive pulmonary disease: Secondary | ICD-10-CM

## 2013-01-25 MED ORDER — FLUTICASONE-SALMETEROL 250-50 MCG/DOSE IN AEPB: 1.0000 | INHALATION_SPRAY | Freq: Two times a day (BID) | RESPIRATORY_TRACT | Status: DC

## 2013-01-25 NOTE — Patient Instructions (Addendum)
We can continue allergy vaccine 1:10 here  Samples x 2 Advair 250    Try this, 1 puff and rinse mouth, twice daily, instead of Advair 500. If the 250 strength controls yuour breathing as well as the 500, let us know and we can change your prescription.

## 2013-01-25 NOTE — Progress Notes (Signed)
Patient ID: [name] [name], female    DOB: April 22, 1927, 78 y.o.   MRN: [id number]  HPI 07/01/11- 79 year old female former smoker (100 pack years ) followed for asthma/COPD, allergic rhinitis, chronic sinusitis, remote history lung cancer Last here - January 15, 2011,  Went to her primary office at [facility] NP for viral illness in June. Had a lot of shortness of breath while in rehab at her neurosurgery office for followup of back pain. Pools liquid in her mouth. Denies easy choking with swallowing; denies smothering when lying down at night.  Continues allergy vaccine. This week using rescue inhaler several times/ day- does help. Advair 250. Finishes prednisone taper in 3 more days- it did help a lot CT chest 11/23/10- RULobectomy, emphysema, NAD.  Plan 6 MWT next visit.   01/24/12-  79 year old female former smoker (100 pack years ) followed for asthma/COPD, allergic rhinitis, chronic sinusitis, remote history lung cancer Has had nerve blocks in cervical spine to relieve occipital pain. No respiratory complaints associated with that. Breathing overall is better. Continues allergy vaccine here at 1:10 without problem. 6 minute walk test today was reviewed with her: 97%, 98%, 97%. Walk only 168 m. Stopped at 3 minutes complaining of dizziness and weakness but able to restart after a short rest. Impression: Limited exercise tolerance with complaints of dizziness and weakness not reflected in blood pressure, heart rate or oxygen levels.  07/28/12- 79 year old female former smoker (100 pack years ) followed for asthma/COPD, allergic rhinitis, chronic sinusitis, remote history lung cancer Still on vaccine 1:10 GH and doing pretty well; ragweed is bothersome at this time. But not much nasal congestion or nose blowing. Mild cough when her lawn is mowed. Being followed by oncology for left upper lobe nodule with CT scan next scheduled June 2014. She feels she is better off continuing Advair 500. Still having  pains in the face and head she associates with pinched nerves in her neck CT chest 04/24/12-we reviewed images IMPRESSION:  1. Status post right upper lobectomy without definite evidence of  local recurrence of disease or new metastatic disease in the  thorax.  2. However, there is a new 4 mm left upper lobe nodule (image 16  of series 5) which is highly nonspecific. However, attention on a  follow-up CT scan in 1 year is recommended. This recommendation  follows the consensus statement: Guidelines for Management of Small  Pulmonary Nodules Detected on CT Scans: A Statement from the  Fleischner Society as published in Radiology 2005; 237:395-400.  3. Atherosclerosis, including left anterior descending and right  coronary artery disease. Please note that although the presence of  coronary artery calcium documents the presence of coronary artery  disease, the severity of this disease and any potential stenosis  cannot be assessed on this non-gated CT examination. Assessment  for potential risk factor modification, dietary therapy or  pharmacologic therapy may be warranted, if clinically indicated.  Original Report Authenticated By: Florencia Reasons, M.D.   8668- 79 year old female former smoker (100 pack years ) followed for asthma/COPD, allergic rhinitis, chronic sinusitis, remote history lung cancer/ RULobectomy, lung nodule/ Oncology f/u FOLLOWS NWG:NFAOZ on allergy vaccine 1:10 GH and doing well; denies any flare ups at this time. Feels well controlled now. Using Nasonex. Always somewhat hoarse. Has continued Advair 500 and feels chest is very stable without cough or wheeze.  Review of Systems-see HPI Constitutional:   No-   weight loss, night sweats, fevers, chills, fatigue, lassitude. HEENT:   +  headaches, no-difficulty swallowing, tooth/dental problems, sore throat,       No-  sneezing, itching, ear ache, nasal congestion, post nasal drip,  CV:  No-   chest pain, orthopnea, PND,  swelling in lower extremities, anasarca, dizziness, palpitations Resp: +   shortness of breath with exertion or at rest.              No-   productive cough,  +non-productive cough,  No-  coughing up of blood.              No-   change in color of mucus.  No- wheezing.   Skin: No-   rash or lesions. GI:  No-   heartburn, indigestion, abdominal pain, nausea, vomiting,  GU:   MS:  No-   joint pain or swelling.  .  + back pain. Neuro- weak and dizzy with exertion Psych:  No- change in mood or affect. No depression or anxiety.  No memory loss.  Objective:   Physical Exam General- Alert, Oriented, Affect-appropriate, Distress- tense facies Skin- rash-none, lesions- none, excoriation- none Lymphadenopathy- none Head- atraumatic            Eyes- Gross vision intact, PERRLA, conjunctivae clear secretions            Ears- Hearing, canals normal            Nose- Clear, No-Septal dev, mucus, polyps, erosion, perforation             Throat- Mallampati III , mucosa clear , drainage- none, tonsils- atrophic. +Hoarse Neck- flexible , trachea midline, no stridor , thyroid nl, carotid no bruit Chest - symmetrical excursion , unlabored           Heart/CV- RRR , no murmur , no gallop  , no rub, nl s1 s2  Pulse is normal and regular.                            JVD- none , edema- none, stasis changes- none, varices- none           Lung- clear to P&A, wheeze- none, cough- none , dullness-none, rub- none   There is no wheeze, unlabored.           Chest wall-  Abd-  Br/ Gen/ Rectal- Not done, not indicated Extrem- cyanosis- none, clubbing, none, atrophy- none, strength- nl Neuro- right facial tic

## 2013-01-30 NOTE — Assessment & Plan Note (Signed)
Consider possibility that Advair 500 contributes to her hoarseness, which is not progressive or associated with throat pain or dysphagia. Plan-sample Advair 250 to try instead of 500, watching chest symptoms.

## 2013-01-30 NOTE — Assessment & Plan Note (Signed)
Okay to continue allergy vaccine at 1:10, and Nasonex as discussed.

## 2013-01-30 NOTE — Assessment & Plan Note (Signed)
Oncology continues to follow lung nodule/CT

## 2013-02-01 ENCOUNTER — Ambulatory Visit (INDEPENDENT_AMBULATORY_CARE_PROVIDER_SITE_OTHER): Payer: Medicare Other

## 2013-02-01 DIAGNOSIS — J309 Allergic rhinitis, unspecified: Secondary | ICD-10-CM | POA: Diagnosis not present

## 2013-02-08 ENCOUNTER — Encounter: Payer: Self-pay | Admitting: Internal Medicine

## 2013-02-08 ENCOUNTER — Ambulatory Visit (INDEPENDENT_AMBULATORY_CARE_PROVIDER_SITE_OTHER): Payer: Medicare Other

## 2013-02-08 ENCOUNTER — Telehealth: Payer: Self-pay | Admitting: Internal Medicine

## 2013-02-08 DIAGNOSIS — J309 Allergic rhinitis, unspecified: Secondary | ICD-10-CM | POA: Diagnosis not present

## 2013-02-08 MED ORDER — FLUTICASONE-SALMETEROL 250-50 MCG/DOSE IN AEPB: 1.0000 | INHALATION_SPRAY | Freq: Two times a day (BID) | RESPIRATORY_TRACT | Status: DC

## 2013-02-08 MED ORDER — MOMETASONE FUROATE 50 MCG/ACT NA SUSP: NASAL | Status: DC

## 2013-02-08 NOTE — Telephone Encounter (Signed)
Pt informed that meds were sent to pharm

## 2013-02-08 NOTE — Telephone Encounter (Signed)
Per CY-okay to both refills; Advair 250/50 1 puff BID and Rinse mouth; Nasonex 1-2 puffs each nostril once daily.

## 2013-02-08 NOTE — Telephone Encounter (Signed)
Spoke with patient, patient states she can not tell a difference since going from the Advair 500 to the 250 States she is about half way through the second sample of 2 she was given  Requesting Rx  to be sent it for Advair 250 Also requesting rx for Nasonex be called in.  Pharmacy: [facility]  Last OV:01/25/13  Patient Instructions    We can continue allergy vaccine 1:10 here  Samples x 2 Advair 250 Try this, 1 puff and rinse mouth, twice daily, instead of Advair 500. If the 250 strength controls yuour breathing as well as the 500, let us know and we can change your prescription.   Next OV: 07/26/13

## 2013-02-08 NOTE — Telephone Encounter (Signed)
ATC patient, no answer. LMOMTCB  Rx have been sent in

## 2013-02-09 ENCOUNTER — Telehealth: Payer: Self-pay | Admitting: Internal Medicine

## 2013-02-09 MED ORDER — MOMETASONE FUROATE 50 MCG/ACT NA SUSP: NASAL | Status: DC

## 2013-02-09 MED ORDER — FLUTICASONE-SALMETEROL 250-50 MCG/DOSE IN AEPB: 1.0000 | INHALATION_SPRAY | Freq: Two times a day (BID) | RESPIRATORY_TRACT | Status: DC

## 2013-02-09 NOTE — Telephone Encounter (Signed)
Spoke with pharmacist and he states that pt has already picked up 30 day supply of advair and nasonex.  Sent in new rx for 90 day supply to keep on hold for next refill.  Pt advised of this.

## 2013-02-15 ENCOUNTER — Ambulatory Visit (INDEPENDENT_AMBULATORY_CARE_PROVIDER_SITE_OTHER): Payer: Medicare Other

## 2013-02-15 DIAGNOSIS — J309 Allergic rhinitis, unspecified: Secondary | ICD-10-CM | POA: Diagnosis not present

## 2013-02-22 ENCOUNTER — Ambulatory Visit (INDEPENDENT_AMBULATORY_CARE_PROVIDER_SITE_OTHER): Payer: Medicare Other

## 2013-02-22 DIAGNOSIS — J309 Allergic rhinitis, unspecified: Secondary | ICD-10-CM | POA: Diagnosis not present

## 2013-03-01 ENCOUNTER — Ambulatory Visit (INDEPENDENT_AMBULATORY_CARE_PROVIDER_SITE_OTHER): Payer: Medicare Other

## 2013-03-01 DIAGNOSIS — J309 Allergic rhinitis, unspecified: Secondary | ICD-10-CM | POA: Diagnosis not present

## 2013-03-02 ENCOUNTER — Ambulatory Visit (INDEPENDENT_AMBULATORY_CARE_PROVIDER_SITE_OTHER): Payer: Medicare Other

## 2013-03-02 DIAGNOSIS — J309 Allergic rhinitis, unspecified: Secondary | ICD-10-CM

## 2013-03-08 ENCOUNTER — Ambulatory Visit (INDEPENDENT_AMBULATORY_CARE_PROVIDER_SITE_OTHER): Payer: Medicare Other

## 2013-03-08 DIAGNOSIS — J309 Allergic rhinitis, unspecified: Secondary | ICD-10-CM | POA: Diagnosis not present

## 2013-03-15 ENCOUNTER — Ambulatory Visit (INDEPENDENT_AMBULATORY_CARE_PROVIDER_SITE_OTHER): Payer: Medicare Other

## 2013-03-15 DIAGNOSIS — J309 Allergic rhinitis, unspecified: Secondary | ICD-10-CM

## 2013-03-16 DIAGNOSIS — M24469 Recurrent dislocation, unspecified knee: Secondary | ICD-10-CM | POA: Diagnosis not present

## 2013-03-17 ENCOUNTER — Telehealth: Payer: Self-pay | Admitting: Internal Medicine

## 2013-03-17 MED ORDER — ALBUTEROL SULFATE HFA 108 (90 BASE) MCG/ACT IN AERS: 2.0000 | INHALATION_SPRAY | RESPIRATORY_TRACT | Status: DC | PRN

## 2013-03-17 MED ORDER — FLUTICASONE-SALMETEROL 250-50 MCG/DOSE IN AEPB: 1.0000 | INHALATION_SPRAY | Freq: Two times a day (BID) | RESPIRATORY_TRACT | Status: DC

## 2013-03-17 MED ORDER — MOMETASONE FUROATE 50 MCG/ACT NA SUSP: NASAL | Status: DC

## 2013-03-17 NOTE — Telephone Encounter (Signed)
I spoke with pt and is aware will send all 3 rx's to [facility]. She voiced her understanding and needed nothing further

## 2013-03-22 ENCOUNTER — Ambulatory Visit (INDEPENDENT_AMBULATORY_CARE_PROVIDER_SITE_OTHER): Payer: Medicare Other

## 2013-03-22 DIAGNOSIS — J309 Allergic rhinitis, unspecified: Secondary | ICD-10-CM

## 2013-03-30 ENCOUNTER — Ambulatory Visit (INDEPENDENT_AMBULATORY_CARE_PROVIDER_SITE_OTHER): Payer: Medicare Other

## 2013-03-30 DIAGNOSIS — J309 Allergic rhinitis, unspecified: Secondary | ICD-10-CM | POA: Diagnosis not present

## 2013-04-05 ENCOUNTER — Ambulatory Visit (INDEPENDENT_AMBULATORY_CARE_PROVIDER_SITE_OTHER): Payer: Medicare Other

## 2013-04-05 DIAGNOSIS — J309 Allergic rhinitis, unspecified: Secondary | ICD-10-CM | POA: Diagnosis not present

## 2013-04-12 ENCOUNTER — Ambulatory Visit (INDEPENDENT_AMBULATORY_CARE_PROVIDER_SITE_OTHER): Payer: Medicare Other

## 2013-04-12 DIAGNOSIS — J309 Allergic rhinitis, unspecified: Secondary | ICD-10-CM

## 2013-04-16 ENCOUNTER — Telehealth: Payer: Self-pay | Admitting: Internal Medicine

## 2013-04-16 MED ORDER — PREDNISONE 10 MG PO TABS: ORAL_TABLET | ORAL | Status: DC

## 2013-04-16 MED ORDER — AZITHROMYCIN 250 MG PO TABS: 250.0000 mg | ORAL_TABLET | ORAL | Status: DC

## 2013-04-16 NOTE — Telephone Encounter (Signed)
I spoke with pt and she c/o cough w. Green-yellow phlem, lots of wheezing, nasal congestion, and chest congestion x 2 days. She has been taking benadryl. requesting further recs. Please advise Dr. Maple Hudson thanks Last ov 01/25/13 07/26/13 pending Allergies  Allergen Reactions  . Aspirin     REACTION: unknown reaction  . Penicillins     REACTION: unknown reaction

## 2013-04-16 NOTE — Telephone Encounter (Signed)
Per CY, Zpak as directed and Pred taper 8day if needed for increased wheezing to be called into pt pharm [facility]. Pred taper #20 4x2, 3x2, 2,x2,1x2 then stop.  Pt aware that this medication is being called into her pharm--she is aware that the pred will be placed in HOLD for if she absolutely needs the medication this weekend.

## 2013-04-19 ENCOUNTER — Ambulatory Visit: Payer: Medicare Other

## 2013-04-26 ENCOUNTER — Ambulatory Visit (INDEPENDENT_AMBULATORY_CARE_PROVIDER_SITE_OTHER): Payer: Medicare Other

## 2013-04-26 DIAGNOSIS — J309 Allergic rhinitis, unspecified: Secondary | ICD-10-CM | POA: Diagnosis not present

## 2013-04-30 ENCOUNTER — Other Ambulatory Visit (HOSPITAL_BASED_OUTPATIENT_CLINIC_OR_DEPARTMENT_OTHER): Payer: Medicare Other | Admitting: Lab

## 2013-04-30 ENCOUNTER — Encounter (HOSPITAL_COMMUNITY): Payer: Self-pay

## 2013-04-30 ENCOUNTER — Ambulatory Visit (HOSPITAL_COMMUNITY)
Admission: RE | Admit: 2013-04-30 | Discharge: 2013-04-30 | Disposition: A | Discharge status: Home / Self Care | Payer: Medicare Other | Source: Ambulatory Visit | Attending: Internal Medicine | Admitting: Internal Medicine

## 2013-04-30 DIAGNOSIS — J438 Other emphysema: Secondary | ICD-10-CM | POA: Insufficient documentation

## 2013-04-30 DIAGNOSIS — C3490 Malignant neoplasm of unspecified part of unspecified bronchus or lung: Secondary | ICD-10-CM

## 2013-04-30 DIAGNOSIS — K7689 Other specified diseases of liver: Secondary | ICD-10-CM | POA: Diagnosis not present

## 2013-04-30 DIAGNOSIS — I700 Atherosclerosis of aorta: Secondary | ICD-10-CM | POA: Insufficient documentation

## 2013-04-30 LAB — COMPREHENSIVE METABOLIC PANEL (CC13)
Albumin: 4.1 g/dL (ref 3.5–5.0)
Alkaline Phosphatase: 60 U/L (ref 40–150)
BUN: 19.4 mg/dL (ref 7.0–26.0)
Glucose: 94 mg/dl (ref 70–140)
Potassium: 5 mEq/L (ref 3.5–5.1)

## 2013-04-30 LAB — CBC WITH DIFFERENTIAL/PLATELET
Basophils Absolute: 0.1 10*3/uL (ref 0.0–0.1)
Eosinophils Absolute: 0.1 10*3/uL (ref 0.0–0.5)
HCT: 39 % (ref 34.8–46.6)
HGB: 13 g/dL (ref 11.6–15.9)
LYMPH%: 17.5 % (ref 14.0–49.7)
MCV: 91.1 fL (ref 79.5–101.0)
MONO%: 7.1 % (ref 0.0–14.0)
NEUT#: 8 10*3/uL — ABNORMAL HIGH (ref 1.5–6.5)
Platelets: 290 10*3/uL (ref 145–400)
RDW: 13.2 % (ref 11.2–14.5)

## 2013-05-03 ENCOUNTER — Ambulatory Visit: Payer: Medicare Other

## 2013-05-04 ENCOUNTER — Telehealth: Payer: Self-pay | Admitting: Internal Medicine

## 2013-05-04 ENCOUNTER — Ambulatory Visit (HOSPITAL_BASED_OUTPATIENT_CLINIC_OR_DEPARTMENT_OTHER): Payer: Medicare Other | Admitting: Internal Medicine

## 2013-05-04 ENCOUNTER — Encounter: Payer: Self-pay | Admitting: Internal Medicine

## 2013-05-04 VITALS — BP 190/71 | HR 65 | Temp 97.9°F | Resp 17 | Ht 63.0 in | Wt 159.9 lb

## 2013-05-04 DIAGNOSIS — C3490 Malignant neoplasm of unspecified part of unspecified bronchus or lung: Secondary | ICD-10-CM | POA: Diagnosis not present

## 2013-05-04 NOTE — Telephone Encounter (Signed)
Gave pt appt for lab and Md for  July 2015, also printed AVS

## 2013-05-04 NOTE — Progress Notes (Signed)
[facility] [phone]   [phone]  OFFICE PROGRESS NOTE  [name] Housekeeper, [name] [name] [address] [place] [address]  DIAGNOSIS: Stage IB (T2, N0, M0) non-small cell lung cancer, bronchoalveolar carcinoma diagnosed in June of 2006   PRIOR THERAPY:  #1 status post right upper lobectomy with lymph node dissection under the care of Dr. Edwyna Shell on 05/09/2005 and the pathology revealed 4.5 CM bronchoalveolar adenocarcinoma with focal involvement of the visceral pleura.  #2 status post 4 cycles of adjuvant chemotherapy with carboplatin and paclitaxel. Last dose was given 09/17/2005.   CURRENT THERAPY: Observation.  INTERVAL HISTORY: [name] [name] 79 y.o. female returns to the clinic today for annual followup visit. The patient has no complaints today. She denied having any significant weight loss or night sweats. She has no chest pain, shortness breath, cough or hemoptysis. She had repeat CT scan of the chest performed recently and she is here for evaluation and discussion of her scan results.  MEDICAL HISTORY: Past Medical History  Diagnosis Date  . Cancer     lung ca  . Chronic obstructive asthma, unspecified   . Malignant neoplasm of bronchus and lung, unspecified site   . Allergic rhinitis, cause unspecified   . Angioneurotic edema not elsewhere classified     ALLERGIES:  is allergic to aspirin and penicillins.  MEDICATIONS:  Current Outpatient Prescriptions  Medication Sig Dispense Refill  . acetaminophen (TYLENOL) 500 MG tablet Take 1,000 mg by mouth as needed. For pain.      Marland Kitchen albuterol (PROVENTIL HFA;VENTOLIN HFA) 108 (90 BASE) MCG/ACT inhaler Inhale 2 puffs into the lungs every 4 (four) hours as needed. For shortness of breath.  3 Inhaler  3  . calcium carbonate (OS-CAL) 600 MG TABS Take 600 mg by mouth 2 (two) times daily with a meal.       . diazepam (VALIUM) 5 MG tablet Take 2.5 mg by mouth at bedtime as needed. For  sleep.      . ergocalciferol (VITAMIN D2) 50000 UNITS capsule Take 50,000 Units by mouth every 30 (thirty) days. First Sunday of each month.      . famotidine (PEPCID) 20 MG tablet Take 20 mg by mouth daily.        . Fluticasone-Salmeterol (ADVAIR DISKUS) 250-50 MCG/DOSE AEPB Inhale 1 puff into the lungs 2 (two) times daily.  180 each  3  . loratadine-pseudoephedrine (CLARITIN-D 24-HOUR) 10-240 MG per 24 hr tablet Take 1 tablet by mouth daily.        . meloxicam (MOBIC) 15 MG tablet Take 7.5-15 mg by mouth daily.       . metoprolol (TOPROL-XL) 50 MG 24 hr tablet Take 50 mg by mouth daily.        . mometasone (NASONEX) 50 MCG/ACT nasal spray 1-2 sprays in each nostril daily  51 g  3  . Omega-3 Fatty Acids (FISH OIL) 1000 MG CAPS Take 1 capsule by mouth daily.       . sertraline (ZOLOFT) 25 MG tablet Take 1 tablet by mouth daily.      Marland Kitchen UNABLE TO FIND Allergy shots weekly      . HYDROcodone-acetaminophen (NORCO/VICODIN) 5-325 MG per tablet Take 1 tablet by mouth at bedtime.       . [DISCONTINUED] Calcium Carbonate-Vitamin D (CALTRATE 600+D) 600-400 MG-UNIT per tablet Take 1 tablet by mouth daily.         No current facility-administered medications for this visit.  SURGICAL HISTORY:  Past Surgical History  Procedure Laterality Date  . Right upper lobectomy      REVIEW OF SYSTEMS:  A comprehensive review of systems was negative.   PHYSICAL EXAMINATION: General appearance: alert, cooperative and no distress Head: Normocephalic, without obvious abnormality, atraumatic Neck: no adenopathy Lymph nodes: Cervical, supraclavicular, and axillary nodes normal. Resp: clear to auscultation bilaterally Cardio: regular rate and rhythm, S1, S2 normal, no murmur, click, rub or gallop GI: soft, non-tender; bowel sounds normal; no masses,  no organomegaly Extremities: extremities normal, atraumatic, no cyanosis or edema  ECOG PERFORMANCE STATUS: 1 - Symptomatic but completely ambulatory  Blood  pressure 190/71, pulse 65, temperature 97.9 F (36.6 C), temperature source Oral, resp. rate 17, height 5\' 3"  (1.6 m), weight 159 lb 14.4 oz (72.53 kg), SpO2 98.00%.  LABORATORY DATA: Lab Results  Component Value Date   WBC 10.8* 04/30/2013   HGB 13.0 04/30/2013   HCT 39.0 04/30/2013   MCV 91.1 04/30/2013   PLT 290 04/30/2013      Chemistry      Component Value Date/Time   NA 136 04/30/2013 1106   NA 138 04/24/2012 1112   NA 140 11/13/2010 1403   K 5.0 04/30/2013 1106   K 4.5 04/24/2012 1112   K 5.2* 11/13/2010 1403   CL 94* 04/24/2012 1112   CL 102 11/13/2010 1403   CO2 29 04/30/2013 1106   CO2 30 04/24/2012 1112   CO2 30 11/13/2010 1403   BUN 19.4 04/30/2013 1106   BUN 22 04/24/2012 1112   BUN 20 11/13/2010 1403   CREATININE 1.1 04/30/2013 1106   CREATININE 0.7 04/24/2012 1112   CREATININE 1.0 11/13/2010 1403      Component Value Date/Time   CALCIUM 10.2 04/30/2013 1106   CALCIUM 9.5 04/24/2012 1112   CALCIUM 9.6 11/13/2010 1403   ALKPHOS 60 04/30/2013 1106   ALKPHOS 62 04/24/2012 1112   ALKPHOS 58 04/18/2010 1206   AST 20 04/30/2013 1106   AST 24 04/24/2012 1112   AST 18 04/18/2010 1206   ALT 20 04/30/2013 1106   ALT 11 04/18/2010 1206   BILITOT 0.93 04/30/2013 1106   BILITOT 1.00 04/24/2012 1112   BILITOT 0.9 04/18/2010 1206       RADIOGRAPHIC STUDIES: Ct Chest Wo Contrast  04/30/2013   *RADIOLOGY REPORT*  Clinical Data: Restaging lung cancer.  Chemotherapy completed in 2006.  The patient reports coughing, shortness of breath and wheezing.  CT CHEST WITHOUT CONTRAST  Technique:  Multidetector CT imaging of the chest was performed following the standard protocol without IV contrast.  Comparison: Chest CT 04/24/2012 and 04/17/2011.  Findings: There are no enlarged mediastinal, hilar or axillary lymph nodes.  Diffuse atherosclerosis of the aorta, great vessels and coronary arteries is again noted.  There is no pleural or pericardial effusion.  There are stable postsurgical changes status post  right upper lobe resection.  Mild emphysema and scattered scarring appears stable.  There is a stable small ground-glass density in the superior segment of the left lower lobe, measuring 5 mm on image 25.  The recently described new left upper lobe nodule is no longer demonstrated.  However, there is a 4 mm nodule medially in the superior segment of the left lower lobe on image 22 which it is not seen previously.  The visualized upper abdomen has a stable appearance with stable low density hepatic lesions on images 48 and 51.  There is no evidence of adrenal mass.  There are no  worrisome osseous findings.  IMPRESSION:  1.  Grossly stable postoperative appearance of the chest without evidence of local recurrence or definite metastatic disease. 2.  Scattered small pulmonary nodules are again noted with a new 4 mm nodule in the superior segment of the left lower lobe.  The additional nodules are stable to improved.  This new nodule is nonspecific; continued CT followup should be considered given the patient's history.  This recommendation follows the consensus statement: Guidelines for Management of Small Pulmonary Nodules Detected on CT Scans:  A Statement from the Fleischner Society as published in Radiology 2005; 237:395-400.   Original Report Authenticated By: Carey Bullocks, M.D.    ASSESSMENT AND PLAN: This is a very pleasant 79 years old white female with history of stage IB non-small cell lung cancer currently on observation with no evidence for disease recurrence. I discussed the scan results with the patient today.  I recommended for her to continue on observation with repeat CT scan of the chest without contrast in one year. She would come back for followup visit at that time. She was advised to call immediately if she has any concerning symptoms in the interval.  All questions were answered. The patient knows to call the clinic with any problems, questions or concerns. We can certainly see the  patient much sooner if necessary.

## 2013-05-04 NOTE — Patient Instructions (Signed)
Continuous observation with repeat CT scan of the chest in one year.

## 2013-05-05 ENCOUNTER — Ambulatory Visit (INDEPENDENT_AMBULATORY_CARE_PROVIDER_SITE_OTHER): Payer: Medicare Other

## 2013-05-05 ENCOUNTER — Other Ambulatory Visit: Payer: Self-pay

## 2013-05-05 DIAGNOSIS — J309 Allergic rhinitis, unspecified: Secondary | ICD-10-CM | POA: Diagnosis not present

## 2013-05-05 DIAGNOSIS — Z1231 Encounter for screening mammogram for malignant neoplasm of breast: Secondary | ICD-10-CM

## 2013-05-10 ENCOUNTER — Ambulatory Visit (INDEPENDENT_AMBULATORY_CARE_PROVIDER_SITE_OTHER): Payer: Medicare Other

## 2013-05-10 DIAGNOSIS — J309 Allergic rhinitis, unspecified: Secondary | ICD-10-CM

## 2013-05-17 ENCOUNTER — Ambulatory Visit (INDEPENDENT_AMBULATORY_CARE_PROVIDER_SITE_OTHER): Payer: Medicare Other

## 2013-05-17 DIAGNOSIS — J309 Allergic rhinitis, unspecified: Secondary | ICD-10-CM | POA: Diagnosis not present

## 2013-05-18 DIAGNOSIS — C3490 Malignant neoplasm of unspecified part of unspecified bronchus or lung: Secondary | ICD-10-CM | POA: Diagnosis not present

## 2013-05-18 DIAGNOSIS — I10 Essential (primary) hypertension: Secondary | ICD-10-CM | POA: Diagnosis not present

## 2013-05-18 DIAGNOSIS — M1990 Unspecified osteoarthritis, unspecified site: Secondary | ICD-10-CM | POA: Diagnosis not present

## 2013-05-18 DIAGNOSIS — J449 Chronic obstructive pulmonary disease, unspecified: Secondary | ICD-10-CM | POA: Diagnosis not present

## 2013-05-24 ENCOUNTER — Ambulatory Visit (INDEPENDENT_AMBULATORY_CARE_PROVIDER_SITE_OTHER): Payer: Medicare Other

## 2013-05-24 DIAGNOSIS — J309 Allergic rhinitis, unspecified: Secondary | ICD-10-CM | POA: Diagnosis not present

## 2013-05-25 ENCOUNTER — Ambulatory Visit
Admission: RE | Admit: 2013-05-25 | Discharge: 2013-05-25 | Disposition: A | Discharge status: Home / Self Care | Payer: Medicare Other | Source: Ambulatory Visit

## 2013-05-25 DIAGNOSIS — Z1231 Encounter for screening mammogram for malignant neoplasm of breast: Secondary | ICD-10-CM | POA: Diagnosis not present

## 2013-05-31 ENCOUNTER — Ambulatory Visit (INDEPENDENT_AMBULATORY_CARE_PROVIDER_SITE_OTHER): Payer: Medicare Other

## 2013-05-31 DIAGNOSIS — J309 Allergic rhinitis, unspecified: Secondary | ICD-10-CM | POA: Diagnosis not present

## 2013-06-07 ENCOUNTER — Ambulatory Visit (INDEPENDENT_AMBULATORY_CARE_PROVIDER_SITE_OTHER): Payer: Medicare Other

## 2013-06-07 DIAGNOSIS — J309 Allergic rhinitis, unspecified: Secondary | ICD-10-CM | POA: Diagnosis not present

## 2013-06-14 ENCOUNTER — Ambulatory Visit (INDEPENDENT_AMBULATORY_CARE_PROVIDER_SITE_OTHER): Payer: Medicare Other

## 2013-06-14 DIAGNOSIS — J309 Allergic rhinitis, unspecified: Secondary | ICD-10-CM

## 2013-06-21 ENCOUNTER — Ambulatory Visit (INDEPENDENT_AMBULATORY_CARE_PROVIDER_SITE_OTHER): Payer: Medicare Other

## 2013-06-21 DIAGNOSIS — J309 Allergic rhinitis, unspecified: Secondary | ICD-10-CM

## 2013-06-28 ENCOUNTER — Ambulatory Visit (INDEPENDENT_AMBULATORY_CARE_PROVIDER_SITE_OTHER): Payer: Medicare Other

## 2013-06-28 DIAGNOSIS — J309 Allergic rhinitis, unspecified: Secondary | ICD-10-CM

## 2013-07-06 ENCOUNTER — Ambulatory Visit (INDEPENDENT_AMBULATORY_CARE_PROVIDER_SITE_OTHER): Payer: Medicare Other

## 2013-07-06 DIAGNOSIS — J309 Allergic rhinitis, unspecified: Secondary | ICD-10-CM | POA: Diagnosis not present

## 2013-07-12 ENCOUNTER — Ambulatory Visit (INDEPENDENT_AMBULATORY_CARE_PROVIDER_SITE_OTHER): Payer: Medicare Other

## 2013-07-12 DIAGNOSIS — J309 Allergic rhinitis, unspecified: Secondary | ICD-10-CM

## 2013-07-14 ENCOUNTER — Ambulatory Visit (INDEPENDENT_AMBULATORY_CARE_PROVIDER_SITE_OTHER): Payer: Medicare Other

## 2013-07-14 DIAGNOSIS — J309 Allergic rhinitis, unspecified: Secondary | ICD-10-CM

## 2013-07-19 ENCOUNTER — Ambulatory Visit (INDEPENDENT_AMBULATORY_CARE_PROVIDER_SITE_OTHER): Payer: Medicare Other

## 2013-07-19 DIAGNOSIS — Z23 Encounter for immunization: Secondary | ICD-10-CM | POA: Diagnosis not present

## 2013-07-19 DIAGNOSIS — J309 Allergic rhinitis, unspecified: Secondary | ICD-10-CM | POA: Diagnosis not present

## 2013-07-26 ENCOUNTER — Ambulatory Visit (INDEPENDENT_AMBULATORY_CARE_PROVIDER_SITE_OTHER): Payer: Medicare Other | Admitting: Internal Medicine

## 2013-07-26 ENCOUNTER — Ambulatory Visit (INDEPENDENT_AMBULATORY_CARE_PROVIDER_SITE_OTHER): Payer: Medicare Other

## 2013-07-26 ENCOUNTER — Encounter: Payer: Self-pay | Admitting: Internal Medicine

## 2013-07-26 VITALS — BP 116/70 | HR 65 | Ht 63.0 in | Wt 168.2 lb

## 2013-07-26 DIAGNOSIS — J449 Chronic obstructive pulmonary disease, unspecified: Secondary | ICD-10-CM

## 2013-07-26 DIAGNOSIS — J309 Allergic rhinitis, unspecified: Secondary | ICD-10-CM | POA: Diagnosis not present

## 2013-07-26 NOTE — Progress Notes (Signed)
Patient ID: [name] [name], female    DOB: June 13, 1927, 79 y.o.   MRN: [id number]  HPI 07/01/11- 79 year old female former smoker (100 pack years ) followed for asthma/COPD, allergic rhinitis, chronic sinusitis, remote history lung cancer Last here - January 15, 2011,  Went to her primary office at [facility] NP for viral illness in June. Had a lot of shortness of breath while in rehab at her neurosurgery office for followup of back pain. Pools liquid in her mouth. Denies easy choking with swallowing; denies smothering when lying down at night.  Continues allergy vaccine. This week using rescue inhaler several times/ day- does help. Advair 250. Finishes prednisone taper in 3 more days- it did help a lot CT chest 11/23/10- RULobectomy, emphysema, NAD.  Plan 6 MWT next visit.   01/24/12-  79 year old female former smoker (100 pack years ) followed for asthma/COPD, allergic rhinitis, chronic sinusitis, remote history lung cancer Has had nerve blocks in cervical spine to relieve occipital pain. No respiratory complaints associated with that. Breathing overall is better. Continues allergy vaccine here at 1:10 without problem. 6 minute walk test today was reviewed with her: 97%, 98%, 97%. Walk only 168 m. Stopped at 3 minutes complaining of dizziness and weakness but able to restart after a short rest. Impression: Limited exercise tolerance with complaints of dizziness and weakness not reflected in blood pressure, heart rate or oxygen levels.  07/28/12- 79 year old female former smoker (100 pack years ) followed for asthma/COPD, allergic rhinitis, chronic sinusitis, remote history lung cancer Still on vaccine 1:10 GH and doing pretty well; ragweed is bothersome at this time. But not much nasal congestion or nose blowing. Mild cough when her lawn is mowed. Being followed by oncology for left upper lobe nodule with CT scan next scheduled June 2014. She feels she is better off continuing Advair 500. Still having  pains in the face and head she associates with pinched nerves in her neck CT chest 04/24/12-we reviewed images IMPRESSION:  1. Status post right upper lobectomy without definite evidence of  local recurrence of disease or new metastatic disease in the  thorax.  2. However, there is a new 4 mm left upper lobe nodule (image 16  of series 5) which is highly nonspecific. However, attention on a  follow-up CT scan in 1 year is recommended. This recommendation  follows the consensus statement: Guidelines for Management of Small  Pulmonary Nodules Detected on CT Scans: A Statement from the  [employer] as published in Radiology 2005; 237:395-400.  3. Atherosclerosis, including left anterior descending and right  coronary artery disease. Please note that although the presence of  coronary artery calcium documents the presence of coronary artery  disease, the severity of this disease and any potential stenosis  cannot be assessed on this non-gated CT examination. Assessment  for potential risk factor modification, dietary therapy or  pharmacologic therapy may be warranted, if clinically indicated.  Original Report Authenticated By: Etheleen Mayhew, M.D.   1739- 79 year old female former smoker (100 pack years ) followed for asthma/COPD, allergic rhinitis, chronic sinusitis, remote history lung cancer/ RULobectomy, lung nodule/ Oncology f/u FOLLOWS GHW:EXHBZ on allergy vaccine 1:10 GH and doing well; denies any flare ups at this time. Feels well controlled now. Using Nasonex. Always somewhat hoarse. Has continued Advair 500 and feels chest is very stable without cough or wheeze.  07/26/13- 79 year old female former smoker (100 pack years ) followed for asthma/COPD, allergic rhinitis, chronic sinusitis, remote history lung cancer/ RULobectomy, lung  nodule/ Oncology f/u FOLLOWS FOR: still on Allergy vaccine 1:10 GH; slightly congested-started using inhaler again. Also slight audible  wheezing. Had flu vaccine. With season change, notices mild hoarseness and more labored breathing with increased use of rescue inhaler. Heart burn wakes her up with famotidine once daily. Pending total knee replacement. CT 04/30/13 IMPRESSION:  1. Grossly stable postoperative appearance of the chest without  evidence of local recurrence or definite metastatic disease.  2. Scattered small pulmonary nodules are again noted with a new 4  mm nodule in the superior segment of the left lower lobe. The  additional nodules are stable to improved. This new nodule is  nonspecific; continued CT followup should be considered given the  patient's history. This recommendation follows the consensus  statement: Guidelines for Management of Small Pulmonary Nodules  Detected on CT Scans: A Statement from the Fleischner Society as  published in Radiology 2005; 237:395-400.  Original Report Authenticated By: Carey Bullocks, M.D.  Review of Systems-see HPI Constitutional:   No-   weight loss, night sweats, fevers, chills, fatigue, lassitude. HEENT:   + headaches, no-difficulty swallowing, tooth/dental problems, sore throat,       No-  sneezing, itching, ear ache, nasal congestion, post nasal drip,  CV:  No-   chest pain, orthopnea, PND, swelling in lower extremities, anasarca, dizziness, palpitations Resp: +   shortness of breath with exertion or at rest.              No-   productive cough,  +non-productive cough,  No-  coughing up of blood.              No-   change in color of mucus.  No- wheezing.   Skin: No-   rash or lesions. GI:  No-   heartburn, indigestion, abdominal pain, nausea, vomiting,  GU:   MS:  No-   joint pain or swelling.  .  + back pain. Neuro- weak and dizzy with exertion Psych:  No- change in mood or affect. No depression or anxiety.  No memory loss.  Objective:   Physical Exam General- Alert, Oriented, Affect-appropriate, Distress- tense facies Skin- +stasis change,  pretibial,L>R Lymphadenopathy- none Head- atraumatic            Eyes- Gross vision intact, PERRLA, conjunctivae clear secretions            Ears- Hearing, canals normal            Nose- Clear, No-Septal dev, mucus, polyps, erosion, perforation             Throat- Mallampati III , mucosa clear , drainage- none, tonsils- atrophic. +Hoarse Neck- flexible , trachea midline, no stridor , thyroid nl, carotid no bruit Chest - symmetrical excursion , unlabored           Heart/CV- RRR , no murmur , no gallop  , no rub, nl s1 s2  Pulse is normal and regular.                            JVD- none , edema- none, stasis changes- none, varices- none           Lung- +decreased sounds in bases, wheeze- none, cough- none , dullness-none, rub- none   There is no wheeze, unlabored.           Chest wall-  Abd-  Br/ Gen/ Rectal- Not done, not indicated Extrem- cyanosis- none, clubbing, none, atrophy- none, strength-  nl Neuro- right facial tic

## 2013-07-26 NOTE — Patient Instructions (Addendum)
Order- Schedule PFT   Dx COPD  We can continue allergy vaccine 1:10  We can continue present meds  I think it is ok for you to talk with a surgeon about knee replacement. We can send records, including the pulmonary function tests when that is done.

## 2013-08-02 ENCOUNTER — Ambulatory Visit (INDEPENDENT_AMBULATORY_CARE_PROVIDER_SITE_OTHER): Payer: Medicare Other

## 2013-08-02 DIAGNOSIS — J309 Allergic rhinitis, unspecified: Secondary | ICD-10-CM | POA: Diagnosis not present

## 2013-08-06 NOTE — Assessment & Plan Note (Signed)
Minor seasonal exacerbation. Anticipating total knee replacement, we are going to update PFT. At this time she seems stable for necessary surgery  And anesthesia

## 2013-08-09 ENCOUNTER — Ambulatory Visit (INDEPENDENT_AMBULATORY_CARE_PROVIDER_SITE_OTHER): Payer: Medicare Other

## 2013-08-09 DIAGNOSIS — J309 Allergic rhinitis, unspecified: Secondary | ICD-10-CM

## 2013-08-12 ENCOUNTER — Ambulatory Visit (INDEPENDENT_AMBULATORY_CARE_PROVIDER_SITE_OTHER): Payer: Medicare Other | Admitting: Internal Medicine

## 2013-08-12 DIAGNOSIS — J449 Chronic obstructive pulmonary disease, unspecified: Secondary | ICD-10-CM | POA: Diagnosis not present

## 2013-08-12 NOTE — Progress Notes (Signed)
PFT done today. 

## 2013-08-16 ENCOUNTER — Ambulatory Visit (INDEPENDENT_AMBULATORY_CARE_PROVIDER_SITE_OTHER): Payer: Medicare Other

## 2013-08-16 DIAGNOSIS — J309 Allergic rhinitis, unspecified: Secondary | ICD-10-CM

## 2013-08-23 ENCOUNTER — Ambulatory Visit (INDEPENDENT_AMBULATORY_CARE_PROVIDER_SITE_OTHER): Payer: Medicare Other

## 2013-08-23 DIAGNOSIS — J309 Allergic rhinitis, unspecified: Secondary | ICD-10-CM

## 2013-08-30 ENCOUNTER — Ambulatory Visit (INDEPENDENT_AMBULATORY_CARE_PROVIDER_SITE_OTHER): Payer: Medicare Other

## 2013-08-30 DIAGNOSIS — J309 Allergic rhinitis, unspecified: Secondary | ICD-10-CM

## 2013-08-31 DIAGNOSIS — H26499 Other secondary cataract, unspecified eye: Secondary | ICD-10-CM | POA: Diagnosis not present

## 2013-08-31 DIAGNOSIS — H3530 Unspecified macular degeneration: Secondary | ICD-10-CM | POA: Diagnosis not present

## 2013-09-06 ENCOUNTER — Ambulatory Visit (INDEPENDENT_AMBULATORY_CARE_PROVIDER_SITE_OTHER): Payer: Medicare Other

## 2013-09-06 DIAGNOSIS — J309 Allergic rhinitis, unspecified: Secondary | ICD-10-CM

## 2013-09-09 ENCOUNTER — Other Ambulatory Visit: Payer: Self-pay | Admitting: Internal Medicine

## 2013-09-09 ENCOUNTER — Other Ambulatory Visit: Payer: Medicare Other

## 2013-09-09 DIAGNOSIS — M79605 Pain in left leg: Secondary | ICD-10-CM

## 2013-09-09 DIAGNOSIS — I8390 Asymptomatic varicose veins of unspecified lower extremity: Secondary | ICD-10-CM | POA: Diagnosis not present

## 2013-09-09 DIAGNOSIS — I808 Phlebitis and thrombophlebitis of other sites: Secondary | ICD-10-CM | POA: Diagnosis not present

## 2013-09-13 ENCOUNTER — Ambulatory Visit (INDEPENDENT_AMBULATORY_CARE_PROVIDER_SITE_OTHER): Payer: Medicare Other

## 2013-09-13 ENCOUNTER — Ambulatory Visit
Admission: RE | Admit: 2013-09-13 | Discharge: 2013-09-13 | Disposition: A | Discharge status: Home / Self Care | Payer: Medicare Other | Source: Ambulatory Visit | Attending: Internal Medicine | Admitting: Internal Medicine

## 2013-09-13 DIAGNOSIS — M79605 Pain in left leg: Secondary | ICD-10-CM

## 2013-09-13 DIAGNOSIS — M79609 Pain in unspecified limb: Secondary | ICD-10-CM | POA: Diagnosis not present

## 2013-09-13 DIAGNOSIS — J309 Allergic rhinitis, unspecified: Secondary | ICD-10-CM

## 2013-09-20 ENCOUNTER — Ambulatory Visit (INDEPENDENT_AMBULATORY_CARE_PROVIDER_SITE_OTHER): Payer: Medicare Other

## 2013-09-20 DIAGNOSIS — J309 Allergic rhinitis, unspecified: Secondary | ICD-10-CM

## 2013-09-27 ENCOUNTER — Ambulatory Visit (INDEPENDENT_AMBULATORY_CARE_PROVIDER_SITE_OTHER): Payer: Medicare Other

## 2013-09-27 DIAGNOSIS — J309 Allergic rhinitis, unspecified: Secondary | ICD-10-CM | POA: Diagnosis not present

## 2013-10-04 ENCOUNTER — Ambulatory Visit (INDEPENDENT_AMBULATORY_CARE_PROVIDER_SITE_OTHER): Payer: Medicare Other

## 2013-10-04 DIAGNOSIS — J309 Allergic rhinitis, unspecified: Secondary | ICD-10-CM

## 2013-10-11 ENCOUNTER — Ambulatory Visit (INDEPENDENT_AMBULATORY_CARE_PROVIDER_SITE_OTHER): Payer: Medicare Other

## 2013-10-11 DIAGNOSIS — J309 Allergic rhinitis, unspecified: Secondary | ICD-10-CM

## 2013-10-15 ENCOUNTER — Ambulatory Visit: Payer: Medicare Other

## 2013-10-18 ENCOUNTER — Ambulatory Visit (INDEPENDENT_AMBULATORY_CARE_PROVIDER_SITE_OTHER): Payer: Medicare Other

## 2013-10-18 DIAGNOSIS — J309 Allergic rhinitis, unspecified: Secondary | ICD-10-CM | POA: Diagnosis not present

## 2013-10-25 ENCOUNTER — Ambulatory Visit: Payer: Medicare Other

## 2013-10-25 DIAGNOSIS — K5732 Diverticulitis of large intestine without perforation or abscess without bleeding: Secondary | ICD-10-CM | POA: Diagnosis not present

## 2013-11-01 ENCOUNTER — Ambulatory Visit (INDEPENDENT_AMBULATORY_CARE_PROVIDER_SITE_OTHER): Payer: Medicare Other

## 2013-11-01 DIAGNOSIS — J309 Allergic rhinitis, unspecified: Secondary | ICD-10-CM | POA: Diagnosis not present

## 2013-11-08 ENCOUNTER — Ambulatory Visit (INDEPENDENT_AMBULATORY_CARE_PROVIDER_SITE_OTHER): Payer: Medicare Other

## 2013-11-08 DIAGNOSIS — J309 Allergic rhinitis, unspecified: Secondary | ICD-10-CM

## 2013-11-11 ENCOUNTER — Encounter: Payer: Self-pay | Admitting: Internal Medicine

## 2013-11-15 ENCOUNTER — Ambulatory Visit (INDEPENDENT_AMBULATORY_CARE_PROVIDER_SITE_OTHER): Payer: Medicare Other

## 2013-11-15 DIAGNOSIS — J309 Allergic rhinitis, unspecified: Secondary | ICD-10-CM

## 2013-11-17 DIAGNOSIS — N183 Chronic kidney disease, stage 3 unspecified: Secondary | ICD-10-CM | POA: Diagnosis not present

## 2013-11-17 DIAGNOSIS — J449 Chronic obstructive pulmonary disease, unspecified: Secondary | ICD-10-CM | POA: Diagnosis not present

## 2013-11-17 DIAGNOSIS — R7309 Other abnormal glucose: Secondary | ICD-10-CM | POA: Diagnosis not present

## 2013-11-17 DIAGNOSIS — Z0000 Encounter for general adult medical examination without abnormal findings: Secondary | ICD-10-CM | POA: Diagnosis not present

## 2013-11-17 DIAGNOSIS — Z1331 Encounter for screening for depression: Secondary | ICD-10-CM | POA: Diagnosis not present

## 2013-11-17 DIAGNOSIS — I10 Essential (primary) hypertension: Secondary | ICD-10-CM | POA: Diagnosis not present

## 2013-11-17 DIAGNOSIS — C3490 Malignant neoplasm of unspecified part of unspecified bronchus or lung: Secondary | ICD-10-CM | POA: Diagnosis not present

## 2013-11-17 DIAGNOSIS — E782 Mixed hyperlipidemia: Secondary | ICD-10-CM | POA: Diagnosis not present

## 2013-11-17 DIAGNOSIS — F329 Major depressive disorder, single episode, unspecified: Secondary | ICD-10-CM | POA: Diagnosis not present

## 2013-11-22 ENCOUNTER — Ambulatory Visit (INDEPENDENT_AMBULATORY_CARE_PROVIDER_SITE_OTHER): Payer: Medicare Other

## 2013-11-22 DIAGNOSIS — J309 Allergic rhinitis, unspecified: Secondary | ICD-10-CM | POA: Diagnosis not present

## 2013-11-24 ENCOUNTER — Ambulatory Visit (INDEPENDENT_AMBULATORY_CARE_PROVIDER_SITE_OTHER): Payer: Medicare Other

## 2013-11-24 DIAGNOSIS — J309 Allergic rhinitis, unspecified: Secondary | ICD-10-CM | POA: Diagnosis not present

## 2013-11-29 ENCOUNTER — Ambulatory Visit (INDEPENDENT_AMBULATORY_CARE_PROVIDER_SITE_OTHER): Payer: Medicare Other

## 2013-11-29 DIAGNOSIS — J309 Allergic rhinitis, unspecified: Secondary | ICD-10-CM | POA: Diagnosis not present

## 2013-12-06 ENCOUNTER — Ambulatory Visit (INDEPENDENT_AMBULATORY_CARE_PROVIDER_SITE_OTHER): Payer: Medicare Other

## 2013-12-06 DIAGNOSIS — J309 Allergic rhinitis, unspecified: Secondary | ICD-10-CM

## 2013-12-13 ENCOUNTER — Ambulatory Visit (INDEPENDENT_AMBULATORY_CARE_PROVIDER_SITE_OTHER): Payer: Medicare Other

## 2013-12-13 DIAGNOSIS — J309 Allergic rhinitis, unspecified: Secondary | ICD-10-CM | POA: Diagnosis not present

## 2013-12-20 ENCOUNTER — Ambulatory Visit (INDEPENDENT_AMBULATORY_CARE_PROVIDER_SITE_OTHER): Payer: Medicare Other

## 2013-12-20 DIAGNOSIS — J309 Allergic rhinitis, unspecified: Secondary | ICD-10-CM | POA: Diagnosis not present

## 2013-12-27 ENCOUNTER — Ambulatory Visit (INDEPENDENT_AMBULATORY_CARE_PROVIDER_SITE_OTHER): Payer: Medicare Other

## 2013-12-27 DIAGNOSIS — J309 Allergic rhinitis, unspecified: Secondary | ICD-10-CM | POA: Diagnosis not present

## 2013-12-29 ENCOUNTER — Telehealth: Payer: Self-pay | Admitting: Internal Medicine

## 2013-12-29 MED ORDER — AZITHROMYCIN 250 MG PO TABS: ORAL_TABLET | ORAL | Status: DC

## 2013-12-29 NOTE — Telephone Encounter (Signed)
Offer Z pak This may not be an infection, so if it doesn't improve in next few days, she should be seen.

## 2013-12-29 NOTE — Telephone Encounter (Signed)
Spoke with w/ pt. C/o prod cough w/ lots of thick white foamy phlem, nasal congestion, chest tx, PND, runny nose, sneezing often, sore throat, HA, wheezing x January. She is taking diabetic Robitussin. Please advise SN  Thanks  Allergies  Allergen Reactions  . Aspirin     REACTION: GI upset in large amounts  . Codeine Sulfate     REACTION: GI upset  . Penicillins Rash  . Septra Ds [Sulfamethoxazole-Trimethoprim] Rash

## 2013-12-29 NOTE — Telephone Encounter (Signed)
Spoke with pt and advised of Dr Janee Morn recommendations.  Rx for Zpak sent to pharmacy.  Pt will call if symptoms do not improve.

## 2014-01-03 ENCOUNTER — Ambulatory Visit (INDEPENDENT_AMBULATORY_CARE_PROVIDER_SITE_OTHER): Payer: Medicare Other

## 2014-01-03 DIAGNOSIS — J309 Allergic rhinitis, unspecified: Secondary | ICD-10-CM

## 2014-01-04 DIAGNOSIS — M1710 Unilateral primary osteoarthritis, unspecified knee: Secondary | ICD-10-CM | POA: Diagnosis not present

## 2014-01-10 ENCOUNTER — Ambulatory Visit (INDEPENDENT_AMBULATORY_CARE_PROVIDER_SITE_OTHER): Payer: Medicare Other

## 2014-01-10 DIAGNOSIS — J309 Allergic rhinitis, unspecified: Secondary | ICD-10-CM | POA: Diagnosis not present

## 2014-01-17 ENCOUNTER — Ambulatory Visit (INDEPENDENT_AMBULATORY_CARE_PROVIDER_SITE_OTHER): Payer: Medicare Other

## 2014-01-17 DIAGNOSIS — J309 Allergic rhinitis, unspecified: Secondary | ICD-10-CM | POA: Diagnosis not present

## 2014-01-24 ENCOUNTER — Ambulatory Visit: Payer: Medicare Other

## 2014-01-25 ENCOUNTER — Ambulatory Visit (INDEPENDENT_AMBULATORY_CARE_PROVIDER_SITE_OTHER): Payer: Medicare Other | Admitting: Internal Medicine

## 2014-01-25 ENCOUNTER — Encounter: Payer: Self-pay | Admitting: Internal Medicine

## 2014-01-25 ENCOUNTER — Ambulatory Visit (INDEPENDENT_AMBULATORY_CARE_PROVIDER_SITE_OTHER): Payer: Medicare Other

## 2014-01-25 VITALS — BP 124/68 | HR 72 | Ht 63.0 in | Wt 163.4 lb

## 2014-01-25 DIAGNOSIS — J309 Allergic rhinitis, unspecified: Secondary | ICD-10-CM | POA: Diagnosis not present

## 2014-01-25 DIAGNOSIS — C3490 Malignant neoplasm of unspecified part of unspecified bronchus or lung: Secondary | ICD-10-CM | POA: Diagnosis not present

## 2014-01-25 DIAGNOSIS — J449 Chronic obstructive pulmonary disease, unspecified: Secondary | ICD-10-CM

## 2014-01-25 MED ORDER — ALBUTEROL SULFATE HFA 108 (90 BASE) MCG/ACT IN AERS: 2.0000 | INHALATION_SPRAY | Freq: Four times a day (QID) | RESPIRATORY_TRACT | Status: DC | PRN

## 2014-01-25 NOTE — Progress Notes (Signed)
Patient ID: [name] [name], female    DOB: 16-Feb-1927, 80 y.o.   MRN: [id number]  HPI 07/01/11- 80 year old female former smoker (100 pack years ) followed for asthma/COPD, allergic rhinitis, chronic sinusitis, remote history lung cancer Last here - January 15, 2011,  Went to her primary office at [facility] NP for viral illness in June. Had a lot of shortness of breath while in rehab at her neurosurgery office for followup of back pain. Pools liquid in her mouth. Denies easy choking with swallowing; denies smothering when lying down at night.  Continues allergy vaccine. This week using rescue inhaler several times/ day- does help. Advair 250. Finishes prednisone taper in 3 more days- it did help a lot CT chest 11/23/10- RULobectomy, emphysema, NAD.  Plan 6 MWT next visit.   01/24/12-  80 year old female former smoker (100 pack years ) followed for asthma/COPD, allergic rhinitis, chronic sinusitis, remote history lung cancer Has had nerve blocks in cervical spine to relieve occipital pain. No respiratory complaints associated with that. Breathing overall is better. Continues allergy vaccine here at 1:10 without problem. 6 minute walk test today was reviewed with her: 97%, 98%, 97%. Walk only 168 m. Stopped at 3 minutes complaining of dizziness and weakness but able to restart after a short rest. Impression: Limited exercise tolerance with complaints of dizziness and weakness not reflected in blood pressure, heart rate or oxygen levels.  07/28/12- 80 year old female former smoker (100 pack years ) followed for asthma/COPD, allergic rhinitis, chronic sinusitis, remote history lung cancer Still on vaccine 1:10 GH and doing pretty well; ragweed is bothersome at this time. But not much nasal congestion or nose blowing. Mild cough when her lawn is mowed. Being followed by oncology for left upper lobe nodule with CT scan next scheduled June 2014. She feels she is better off continuing Advair 500. Still having  pains in the face and head she associates with pinched nerves in her neck CT chest 04/24/12-we reviewed images IMPRESSION:  1. Status post right upper lobectomy without definite evidence of  local recurrence of disease or new metastatic disease in the  thorax.  2. However, there is a new 4 mm left upper lobe nodule (image 16  of series 5) which is highly nonspecific. However, attention on a  follow-up CT scan in 1 year is recommended. This recommendation  follows the consensus statement: Guidelines for Management of Small  Pulmonary Nodules Detected on CT Scans: A Statement from the  [place] as published in Radiology 2005; 237:395-400.  3. Atherosclerosis, including left anterior descending and right  coronary artery disease. Please note that although the presence of  coronary artery calcium documents the presence of coronary artery  disease, the severity of this disease and any potential stenosis  cannot be assessed on this non-gated CT examination. Assessment  for potential risk factor modification, dietary therapy or  pharmacologic therapy may be warranted, if clinically indicated.  Original Report Authenticated By: Etheleen Mayhew, M.D.   [id number]- 80 year old female former smoker (100 pack years ) followed for asthma/COPD, allergic rhinitis, chronic sinusitis, remote history lung cancer/ RULobectomy, lung nodule/ Oncology f/u FOLLOWS ATF:TDDUK on allergy vaccine 1:10 GH and doing well; denies any flare ups at this time. Feels well controlled now. Using Nasonex. Always somewhat hoarse. Has continued Advair 500 and feels chest is very stable without cough or wheeze.  07/26/13- 80 year old female former smoker (100 pack years ) followed for asthma/COPD, allergic rhinitis, chronic sinusitis, remote history lung cancer/ RULobectomy, lung  nodule/ Oncology f/u FOLLOWS FOR: still on Allergy vaccine 1:10 GO; slightly congested-started using inhaler again. Also slight audible  wheezing. Had flu vaccine. With season change, notices mild hoarseness and more labored breathing with increased use of rescue inhaler. Heart burn wakes her up with famotidine once daily. Pending total knee replacement. Lung nodule was stable - f/u Dr Gwynneth Albright CT 04/30/13 IMPRESSION:  1. Grossly stable postoperative appearance of the chest without  evidence of local recurrence or definite metastatic disease.  2. Scattered small pulmonary nodules are again noted with a new 4  mm nodule in the superior segment of the left lower lobe. The  additional nodules are stable to improved. This new nodule is  nonspecific; continued CT followup should be considered given the  patient's history. This recommendation follows the consensus  statement: Guidelines for Management of Small Pulmonary Nodules  Detected on CT Scans: A Statement from the [place] as  published in Radiology 2005; 237:395-400.  Original Report Authenticated By: Richardean Sale, M.D.  01/25/14- 80 year old female former smoker (100 pack years ) followed for asthma/COPD, allergic rhinitis, chronic sinusitis, remote history lung cancer/ RULobectomy, lung nodule/ Oncology f/u FOLLOWS FOR: still on Allergy vaccine 1:10 GH and doing well. Had recent flare up of coughing and sneezing for 2 days. Pt had PFT 08-2013- results being sought. Not much cough now. Being evaluated for L TKR in May. Needs to change rescue inhaler for insurance.  Review of Systems-see HPI Constitutional:   No-   weight loss, night sweats, fevers, chills, fatigue, lassitude. HEENT:   + headaches, no-difficulty swallowing, tooth/dental problems, sore throat,       No-  sneezing, itching, ear ache, nasal congestion, post nasal drip,  CV:  No-   chest pain, orthopnea, PND, swelling in lower extremities, anasarca, dizziness, palpitations Resp: +   shortness of breath with exertion or at rest.              No-   productive cough,  +non-productive cough,  No-   coughing up of blood.              No-   change in color of mucus.  No- wheezing.   Skin: No-   rash or lesions. GI:  No-   heartburn, indigestion, abdominal pain, nausea, vomiting,  GU:   MS:  No-   joint pain or swelling.  .  + back pain. Neuro- weak and dizzy with exertion Psych:  No- change in mood or affect. No depression or anxiety.  No memory loss.  Objective:   Physical Exam General- Alert, Oriented, Affect-appropriate, Distress- tense facies Skin- +stasis change, pretibial, L>R Lymphadenopathy- none Head- atraumatic            Eyes- Gross vision intact, PERRLA, conjunctivae clear secretions            Ears- Hearing, canals normal            Nose- Clear, No-Septal dev, mucus, polyps, erosion, perforation             Throat- Mallampati III , mucosa clear , drainage- none, tonsils- atrophic.  Neck- flexible , trachea midline, no stridor , thyroid nl, carotid no bruit Chest - symmetrical excursion , unlabored           Heart/CV- RRR , no murmur , no gallop  , no rub, nl s1 s2  Pulse is normal and regular.  JVD- none , edema- none, stasis changes- none, varices- none           Lung- +decreased sounds in bases, wheeze- none, cough- none , dullness-none, rub-                              none            Chest wall-  Abd-  Br/ Gen/ Rectal- Not done, not indicated Extrem- cyanosis- none, clubbing, none, atrophy- none, strength- nl Neuro- right facial tic

## 2014-01-25 NOTE — Patient Instructions (Signed)
Script sent to [facility] for Proair albuterol HFA rescue inhaler  Please call as needed

## 2014-01-25 NOTE — Assessment & Plan Note (Signed)
Now w/ lung nodule- f/u CT per Dr Mohamed/ Oncology

## 2014-01-25 NOTE — Assessment & Plan Note (Addendum)
Good control since she resolved a winter chest cold.  Plan- ok for necessary knee replacement. We are seeking old PFT. Need change rescue MDI to insurance preferred.

## 2014-01-26 LAB — PULMONARY FUNCTION TEST
DL/VA % pred: 94 %
DL/VA: 4.2 ml/min/mmHg/L
DLCO unc % pred: 74 %
DLCO unc: 15.54 ml/min/mmHg
FEF 25-75 Post: 0.52 L/sec
FEF 25-75 Pre: 0.47 L/sec
FEF2575-%Change-Post: 10 %
FEF2575-%Pred-Post: 50 %
FEF2575-%Pred-Pre: 45 %
FEV1-%Change-Post: 2 %
FEV1-%Pred-Post: 70 %
FEV1-%Pred-Pre: 68 %
FEV1-Post: 1.08 L
FEV1-Pre: 1.05 L
FEV1FVC-%Change-Post: 0 %
FEV1FVC-%Pred-Pre: 76 %
FEV6-%Change-Post: 1 %
FEV6-%Pred-Post: 95 %
FEV6-%Pred-Pre: 94 %
FEV6-Post: 1.88 L
FEV6-Pre: 1.85 L
FEV6FVC-%Change-Post: 0 %
FEV6FVC-%Pred-Post: 105 %
FEV6FVC-%Pred-Pre: 105 %
FVC-%Change-Post: 1 %
FVC-%Pred-Post: 91 %
FVC-%Pred-Pre: 89 %
FVC-Post: 1.93 L
Post FEV1/FVC ratio: 56 %
Post FEV6/FVC ratio: 98 %
Pre FEV1/FVC ratio: 56 %
Pre FEV6/FVC Ratio: 98 %
RV % pred: 124 %
RV: 2.94 L
TLC % pred: 102 %
TLC: 4.8 L

## 2014-01-31 ENCOUNTER — Ambulatory Visit (INDEPENDENT_AMBULATORY_CARE_PROVIDER_SITE_OTHER): Payer: Medicare Other

## 2014-01-31 DIAGNOSIS — J309 Allergic rhinitis, unspecified: Secondary | ICD-10-CM

## 2014-02-03 NOTE — Progress Notes (Signed)
Quick Note:  Called and spoke to pt regarding results. Pt verbalized understanding and denied any further questions or concerns at this time. ______

## 2014-02-07 ENCOUNTER — Ambulatory Visit: Payer: Medicare Other

## 2014-02-07 ENCOUNTER — Telehealth: Payer: Self-pay | Admitting: Internal Medicine

## 2014-02-07 MED ORDER — PREDNISONE 20 MG PO TABS: 20.0000 mg | ORAL_TABLET | Freq: Every day | ORAL | Status: DC

## 2014-02-07 NOTE — Telephone Encounter (Signed)
Called spoke with pt. C/o increase SOB w/ activity, prod cough w/ clear phlem, wheezing, chest tx, sore throat, chest congestion x 1 week. No PND, no nasal congestion. Using her inshalers and nasonex. Please advise Dr. Annamaria Boots thanks  Allergies  Allergen Reactions  . Aspirin     REACTION: GI upset in large amounts  . Codeine Sulfate     REACTION: GI upset  . Penicillins Rash  . Septra Ds [Sulfamethoxazole-Trimethoprim] Rash     Current Outpatient Prescriptions on File Prior to Visit  Medication Sig Dispense Refill  . acetaminophen (TYLENOL) 500 MG tablet Take 1,000 mg by mouth as needed. For pain.      Marland Kitchen albuterol (PROAIR HFA) 108 (90 BASE) MCG/ACT inhaler Inhale 2 puffs into the lungs every 6 (six) hours as needed for wheezing or shortness of breath.  3 Inhaler  3  . calcium carbonate (OS-CAL) 600 MG TABS Take 600 mg by mouth 2 (two) times daily with a meal.       . diazepam (VALIUM) 5 MG tablet Take 2.5 mg by mouth at bedtime as needed. For sleep.      . ergocalciferol (VITAMIN D2) 50000 UNITS capsule Take 50,000 Units by mouth every 30 (thirty) days. First Sunday of each month.      . famotidine (PEPCID) 20 MG tablet Take 20 mg by mouth daily.        . Fluticasone-Salmeterol (ADVAIR DISKUS) 250-50 MCG/DOSE AEPB Inhale 1 puff into the lungs 2 (two) times daily.  180 each  3  . loratadine-pseudoephedrine (CLARITIN-D 24-HOUR) 10-240 MG per 24 hr tablet Take 1 tablet by mouth daily.        . meloxicam (MOBIC) 15 MG tablet Take 7.5-15 mg by mouth daily.       . metoprolol (TOPROL-XL) 50 MG 24 hr tablet Take 50 mg by mouth daily.        . mometasone (NASONEX) 50 MCG/ACT nasal spray 1-2 sprays in each nostril daily  51 g  3  . Omega-3 Fatty Acids (FISH OIL) 1000 MG CAPS Take 1 capsule by mouth daily.       . sertraline (ZOLOFT) 25 MG tablet Take 1 tablet by mouth daily.      Marland Kitchen UNABLE TO FIND Allergy shots weekly      . [DISCONTINUED] Calcium Carbonate-Vitamin D (CALTRATE 600+D) 600-400 MG-UNIT  per tablet Take 1 tablet by mouth daily.         No current facility-administered medications on file prior to visit.

## 2014-02-07 NOTE — Telephone Encounter (Signed)
Try prednisone 20 mg, # 7, 1 daily

## 2014-02-07 NOTE — Telephone Encounter (Signed)
Called and spoke with pt. Aware of recs. RX called in. Nothing further needed

## 2014-02-10 ENCOUNTER — Ambulatory Visit (INDEPENDENT_AMBULATORY_CARE_PROVIDER_SITE_OTHER): Payer: Medicare Other

## 2014-02-10 DIAGNOSIS — J309 Allergic rhinitis, unspecified: Secondary | ICD-10-CM | POA: Diagnosis not present

## 2014-02-14 ENCOUNTER — Ambulatory Visit (INDEPENDENT_AMBULATORY_CARE_PROVIDER_SITE_OTHER): Payer: Medicare Other

## 2014-02-14 DIAGNOSIS — J309 Allergic rhinitis, unspecified: Secondary | ICD-10-CM

## 2014-02-21 ENCOUNTER — Ambulatory Visit: Payer: Medicare Other

## 2014-02-22 ENCOUNTER — Ambulatory Visit (INDEPENDENT_AMBULATORY_CARE_PROVIDER_SITE_OTHER): Payer: Medicare Other

## 2014-02-22 DIAGNOSIS — J309 Allergic rhinitis, unspecified: Secondary | ICD-10-CM | POA: Diagnosis not present

## 2014-02-28 ENCOUNTER — Ambulatory Visit (INDEPENDENT_AMBULATORY_CARE_PROVIDER_SITE_OTHER): Payer: Medicare Other

## 2014-02-28 DIAGNOSIS — J309 Allergic rhinitis, unspecified: Secondary | ICD-10-CM

## 2014-02-28 NOTE — Progress Notes (Signed)
Surgery on 03/21/14.  Need orders in EPIC.  Thank You.

## 2014-03-04 NOTE — Progress Notes (Signed)
Surgery on 03/21/14.  proep on 03/10/14 at 1130am.  Need orders in EPIC.  Thank You.

## 2014-03-05 ENCOUNTER — Other Ambulatory Visit: Payer: Self-pay | Admitting: Orthopedic Surgery

## 2014-03-07 ENCOUNTER — Ambulatory Visit (INDEPENDENT_AMBULATORY_CARE_PROVIDER_SITE_OTHER): Payer: Medicare Other

## 2014-03-07 DIAGNOSIS — J309 Allergic rhinitis, unspecified: Secondary | ICD-10-CM | POA: Diagnosis not present

## 2014-03-07 DIAGNOSIS — I10 Essential (primary) hypertension: Secondary | ICD-10-CM | POA: Diagnosis not present

## 2014-03-07 DIAGNOSIS — M1710 Unilateral primary osteoarthritis, unspecified knee: Secondary | ICD-10-CM | POA: Diagnosis not present

## 2014-03-07 DIAGNOSIS — J449 Chronic obstructive pulmonary disease, unspecified: Secondary | ICD-10-CM | POA: Diagnosis not present

## 2014-03-07 DIAGNOSIS — Z01818 Encounter for other preprocedural examination: Secondary | ICD-10-CM | POA: Diagnosis not present

## 2014-03-07 DIAGNOSIS — IMO0002 Reserved for concepts with insufficient information to code with codable children: Secondary | ICD-10-CM | POA: Diagnosis not present

## 2014-03-09 ENCOUNTER — Encounter (HOSPITAL_COMMUNITY): Payer: Self-pay | Admitting: Pharmacy Technician

## 2014-03-10 ENCOUNTER — Encounter (HOSPITAL_COMMUNITY)
Admission: RE | Admit: 2014-03-10 | Discharge: 2014-03-10 | Disposition: A | Discharge status: Home / Self Care | Payer: Medicare Other | Source: Ambulatory Visit | Attending: Orthopedic Surgery | Admitting: Orthopedic Surgery

## 2014-03-10 ENCOUNTER — Encounter (HOSPITAL_COMMUNITY): Payer: Self-pay

## 2014-03-10 ENCOUNTER — Telehealth: Payer: Self-pay | Admitting: Emergency Medicine

## 2014-03-10 ENCOUNTER — Ambulatory Visit (HOSPITAL_COMMUNITY)
Admission: RE | Admit: 2014-03-10 | Discharge: 2014-03-10 | Disposition: A | Discharge status: Home / Self Care | Payer: Medicare Other | Source: Ambulatory Visit | Attending: Anesthesiology | Admitting: Anesthesiology

## 2014-03-10 DIAGNOSIS — Z01812 Encounter for preprocedural laboratory examination: Secondary | ICD-10-CM | POA: Insufficient documentation

## 2014-03-10 DIAGNOSIS — J45909 Unspecified asthma, uncomplicated: Secondary | ICD-10-CM | POA: Insufficient documentation

## 2014-03-10 DIAGNOSIS — Z87891 Personal history of nicotine dependence: Secondary | ICD-10-CM | POA: Insufficient documentation

## 2014-03-10 DIAGNOSIS — Z85118 Personal history of other malignant neoplasm of bronchus and lung: Secondary | ICD-10-CM | POA: Diagnosis not present

## 2014-03-10 DIAGNOSIS — Z01818 Encounter for other preprocedural examination: Secondary | ICD-10-CM | POA: Insufficient documentation

## 2014-03-10 HISTORY — DX: Other allergy status, other than to drugs and biological substances: Z91.09

## 2014-03-10 HISTORY — DX: Headache: R51

## 2014-03-10 HISTORY — DX: Adverse effect of unspecified anesthetic, initial encounter: T41.45XA

## 2014-03-10 HISTORY — DX: Other complications of anesthesia, initial encounter: T88.59XA

## 2014-03-10 HISTORY — DX: Major depressive disorder, single episode, unspecified: F32.9

## 2014-03-10 HISTORY — DX: Depression, unspecified: F32.A

## 2014-03-10 HISTORY — DX: Unspecified osteoarthritis, unspecified site: M19.90

## 2014-03-10 HISTORY — DX: Other pulmonary collapse: J98.19

## 2014-03-10 LAB — URINALYSIS, ROUTINE W REFLEX MICROSCOPIC
Bilirubin Urine: NEGATIVE
Glucose, UA: NEGATIVE mg/dL
Ketones, ur: NEGATIVE mg/dL
NITRITE: NEGATIVE
Protein, ur: NEGATIVE mg/dL
SPECIFIC GRAVITY, URINE: 1.015 (ref 1.005–1.030)
UROBILINOGEN UA: 0.2 mg/dL (ref 0.0–1.0)
pH: 6 (ref 5.0–8.0)

## 2014-03-10 LAB — COMPREHENSIVE METABOLIC PANEL
ALBUMIN: 4.7 g/dL (ref 3.5–5.2)
ALT: 16 U/L (ref 0–35)
AST: 22 U/L (ref 0–37)
Alkaline Phosphatase: 65 U/L (ref 39–117)
BUN: 28 mg/dL — ABNORMAL HIGH (ref 6–23)
CO2: 31 mEq/L (ref 19–32)
CREATININE: 0.9 mg/dL (ref 0.50–1.10)
Calcium: 10 mg/dL (ref 8.4–10.5)
Chloride: 98 mEq/L (ref 96–112)
GFR calc Af Amer: 66 mL/min — ABNORMAL LOW (ref >90)
GFR calc non Af Amer: 57 mL/min — ABNORMAL LOW (ref >90)
Glucose, Bld: 104 mg/dL — ABNORMAL HIGH (ref 70–99)
POTASSIUM: 5.1 meq/L (ref 3.7–5.3)
Sodium: 141 mEq/L (ref 137–147)
TOTAL PROTEIN: 7.7 g/dL (ref 6.0–8.3)
Total Bilirubin: 0.7 mg/dL (ref 0.3–1.2)

## 2014-03-10 LAB — URINE MICROSCOPIC-ADD ON

## 2014-03-10 LAB — CBC
HEMATOCRIT: 39.6 % (ref 36.0–46.0)
Hemoglobin: 13.4 g/dL (ref 12.0–15.0)
MCH: 31.8 pg (ref 26.0–34.0)
MCHC: 33.8 g/dL (ref 30.0–36.0)
MCV: 93.8 fL (ref 78.0–100.0)
PLATELETS: 294 10*3/uL (ref 150–400)
RBC: 4.22 MIL/uL (ref 3.87–5.11)
RDW: 12.9 % (ref 11.5–15.5)
WBC: 8.3 10*3/uL (ref 4.0–10.5)

## 2014-03-10 LAB — SURGICAL PCR SCREEN
MRSA, PCR: NEGATIVE
Staphylococcus aureus: NEGATIVE

## 2014-03-10 LAB — APTT: APTT: 32 s (ref 24–37)

## 2014-03-10 LAB — PROTIME-INR
INR: 0.96 (ref 0.00–1.49)
Prothrombin Time: 12.6 seconds (ref 11.6–15.2)

## 2014-03-10 MED ORDER — PREDNISONE 10 MG PO TABS: ORAL_TABLET | ORAL | Status: DC

## 2014-03-10 MED ORDER — AZITHROMYCIN 250 MG PO TABS: 250.0000 mg | ORAL_TABLET | ORAL | Status: DC

## 2014-03-10 NOTE — Telephone Encounter (Signed)
ZPAK and pred taper sent to pharmacy [facility] Pt aware.  Nothing further needed.

## 2014-03-10 NOTE — Progress Notes (Signed)
Urinalysis with micro results along with CMP faxed to Dr Wynelle Link.

## 2014-03-10 NOTE — Progress Notes (Signed)
Surgery clearance note Dr. Lysle Rubens on chart, EKG 03/07/14 on chart, office note 03/07/14 Dr Lysle Rubens on chart

## 2014-03-10 NOTE — Telephone Encounter (Signed)
Offer Zpak            Prednisone 10 mg, # 20, 4 X 2 DAYS, 3 X 2 DAYS, 2 X 2 DAYS, 1 X 2 DAYS

## 2014-03-10 NOTE — Telephone Encounter (Signed)
Pt is scheduled to have knee surgery coming up 03/21/14 and is having some sinus and chest congestion issues. Pt c/o sore throat, productive clear cough, sneezing x 2 weeks+ Pt had Pre-op and they told her that unless she was treated for her current symptoms they will have to cancel her surgery.  Pt states that she has been on a ZPAK for similar symptoms 12/2013. Pt took Prednisone taper February 07, 2014 Allergies  Allergen Reactions  . Aspirin     REACTION: GI upset in large amounts  . Codeine Sulfate     REACTION: GI upset  . Penicillins Rash  . Septra Ds [Sulfamethoxazole-Trimethoprim] Rash    [facility]  Please advise Dr Annamaria Boots. Thanks.

## 2014-03-10 NOTE — Patient Instructions (Addendum)
[place]  03/10/2014   Your procedure is scheduled on: 03/21/14  Report to [facility] at 8:35 AM.  Call this number if you have problems the morning of surgery [phone]: [phone]   Remember: please bring inhalers on day of surgery   Do not eat food or drink liquids After Midnight.     Take these medicines the morning of surgery with A SIP OF WATER: nasonex, metoprolol, claritin, inhalers, pepcid   Do not wear jewelry, make-up or nail polish.  Do not wear lotions, powders, or perfumes. You may wear deodorant.  Do not shave 48 hours prior to surgery. Men may shave face and neck.  Do not bring valuables to the hospital.  Contacts, dentures or bridgework may not be worn into surgery.  Leave suitcase in the car. After surgery it may be brought to your room.  For patients admitted to the hospital, checkout time is 11:00 AM the day of discharge.    Please read over the following fact sheets that you were given: MRSA Information Paulette Blanch, RN  pre op nurse call if needed [phone]    [facility] - Preparing for Surgery Before surgery, you can play an important role.  Because skin is not sterile, your skin needs to be as free of germs as possible.  You can reduce the number of germs on your skin by washing with CHG (chlorahexidine gluconate) soap before surgery.  CHG is an antiseptic cleaner which kills germs and bonds with the skin to continue killing germs even after washing. Please DO NOT use if you have an allergy to CHG or antibacterial soaps.  If your skin becomes reddened/irritated stop using the CHG and inform your nurse when you arrive at Short Stay. Do not shave (including legs and underarms) for at least 48 hours prior to the first CHG shower.  You may shave your face. Please follow these instructions carefully:  1.  Shower with CHG Soap the night before surgery and the  morning of Surgery.  2.  If you choose to wash your hair, wash your hair first as  usual with your  normal  shampoo.  3.  After you shampoo, rinse your hair and body thoroughly to remove the  shampoo.                            4.  Use CHG as you would any other liquid soap.  You can apply chg directly  to the skin and wash                       Gently with a scrungie or clean washcloth.  5.  Apply the CHG Soap to your body ONLY FROM THE NECK DOWN.   Do not use on open                           Wound or open sores. Avoid contact with eyes, ears mouth and genitals (private parts).                        Genitals (private parts) with your normal soap.             6.  Wash thoroughly, paying special attention to the area where your surgery  will be performed.  7.  Thoroughly rinse your body with warm water  from the neck down.  8.  DO NOT shower/wash with your normal soap after using and rinsing off  the CHG Soap.                9.  Pat yourself dry with a clean towel.            10.  Wear clean pajamas.            11.  Place clean sheets on your bed the night of your first shower and do not  sleep with pets. Day of Surgery : Do not apply any lotions/deodorants the morning of surgery.  Please wear clean clothes to the hospital/surgery center.  FAILURE TO FOLLOW THESE INSTRUCTIONS MAY RESULT IN THE CANCELLATION OF YOUR SURGERY PATIENT SIGNATURE_________________________________  NURSE SIGNATURE__________________________________  ________________________________________________________________________  WHAT IS A BLOOD TRANSFUSION? Blood Transfusion Information  A transfusion is the replacement of blood or some of its parts. Blood is made up of multiple cells which provide different functions.  Red blood cells carry oxygen and are used for blood loss replacement.  White blood cells fight against infection.  Platelets control bleeding.  Plasma helps clot blood.  Other blood products are available for specialized needs, such as hemophilia or other clotting  disorders. BEFORE THE TRANSFUSION  Who gives blood for transfusions?   Healthy volunteers who are fully evaluated to make sure their blood is safe. This is blood bank blood. Transfusion therapy is the safest it has ever been in the practice of medicine. Before blood is taken from a donor, a complete history is taken to make sure that person has no history of diseases nor engages in risky social behavior (examples are intravenous drug use or sexual activity with multiple partners). The donor's travel history is screened to minimize risk of transmitting infections, such as malaria. The donated blood is tested for signs of infectious diseases, such as HIV and hepatitis. The blood is then tested to be sure it is compatible with you in order to minimize the chance of a transfusion reaction. If you or a relative donates blood, this is often done in anticipation of surgery and is not appropriate for emergency situations. It takes many days to process the donated blood. RISKS AND COMPLICATIONS Although transfusion therapy is very safe and saves many lives, the main dangers of transfusion include:   Getting an infectious disease.  Developing a transfusion reaction. This is an allergic reaction to something in the blood you were given. Every precaution is taken to prevent this. The decision to have a blood transfusion has been considered carefully by your caregiver before blood is given. Blood is not given unless the benefits outweigh the risks. AFTER THE TRANSFUSION  Right after receiving a blood transfusion, you will usually feel much better and more energetic. This is especially true if your red blood cells have gotten low (anemic). The transfusion raises the level of the red blood cells which carry oxygen, and this usually causes an energy increase.  The nurse administering the transfusion will monitor you carefully for complications. HOME CARE INSTRUCTIONS  No special instructions are needed after a  transfusion. You may find your energy is better. Speak with your caregiver about any limitations on activity for underlying diseases you may have. SEEK MEDICAL CARE IF:   Your condition is not improving after your transfusion.  You develop redness or irritation at the intravenous (IV) site. SEEK IMMEDIATE MEDICAL CARE IF:  Any of the following symptoms occur over the next  12 hours:  Shaking chills.  You have a temperature by mouth above 102 F (38.9 C), not controlled by medicine.  Chest, back, or muscle pain.  People around you feel you are not acting correctly or are confused.  Shortness of breath or difficulty breathing.  Dizziness and fainting.  You get a rash or develop hives.  You have a decrease in urine output.  Your urine turns a dark color or changes to pink, red, or brown. Any of the following symptoms occur over the next 10 days:  You have a temperature by mouth above 102 F (38.9 C), not controlled by medicine.  Shortness of breath.  Weakness after normal activity.  The white part of the eye turns yellow (jaundice).  You have a decrease in the amount of urine or are urinating less often.  Your urine turns a dark color or changes to pink, red, or brown. Document Released: 10/18/2000 Document Revised: 01/13/2012 Document Reviewed: 06/06/2008 ExitCare Patient Information 2014 [place].  _______________________________________________________________________  Incentive Spirometer  An incentive spirometer is a tool that can help keep your lungs clear and active. This tool measures how well you are filling your lungs with each breath. Taking long deep breaths may help reverse or decrease the chance of developing breathing (pulmonary) problems (especially infection) following:  A long period of time when you are unable to move or be active. BEFORE THE PROCEDURE   If the spirometer includes an indicator to show your best effort, your nurse or  respiratory therapist will set it to a desired goal.  If possible, sit up straight or lean slightly forward. Try not to slouch.  Hold the incentive spirometer in an upright position. INSTRUCTIONS FOR USE  1. Sit on the edge of your bed if possible, or sit up as far as you can in bed or on a chair. 2. Hold the incentive spirometer in an upright position. 3. Breathe out normally. 4. Place the mouthpiece in your mouth and seal your lips tightly around it. 5. Breathe in slowly and as deeply as possible, raising the piston or the ball toward the top of the column. 6. Hold your breath for 3-5 seconds or for as long as possible. Allow the piston or ball to fall to the bottom of the column. 7. Remove the mouthpiece from your mouth and breathe out normally. 8. Rest for a few seconds and repeat Steps 1 through 7 at least 10 times every 1-2 hours when you are awake. Take your time and take a few normal breaths between deep breaths. 9. The spirometer may include an indicator to show your best effort. Use the indicator as a goal to work toward during each repetition. 10. After each set of 10 deep breaths, practice coughing to be sure your lungs are clear. If you have an incision (the cut made at the time of surgery), support your incision when coughing by placing a pillow or rolled up towels firmly against it. Once you are able to get out of bed, walk around indoors and cough well. You may stop using the incentive spirometer when instructed by your caregiver.  RISKS AND COMPLICATIONS  Take your time so you do not get dizzy or light-headed.  If you are in pain, you may need to take or ask for pain medication before doing incentive spirometry. It is harder to take a deep breath if you are having pain. AFTER USE  Rest and breathe slowly and easily.  It can be helpful to  keep track of a log of your progress. Your caregiver can provide you with a simple table to help with this. If you are using the  spirometer at home, follow these instructions: [place] IF:   You are having difficultly using the spirometer.  You have trouble using the spirometer as often as instructed.  Your pain medication is not giving enough relief while using the spirometer.  You develop fever of 100.5 F (38.1 C) or higher. SEEK IMMEDIATE MEDICAL CARE IF:   You cough up bloody sputum that had not been present before.  You develop fever of 102 F (38.9 C) or greater.  You develop worsening pain at or near the incision site. MAKE SURE YOU:   Understand these instructions.  Will watch your condition.  Will get help right away if you are not doing well or get worse. Document Released: 03/03/2007 Document Revised: 01/13/2012 Document Reviewed: 05/04/2007 [facility] Patient Information 2014 [place], [place].   ________________________________________________________________________

## 2014-03-14 ENCOUNTER — Ambulatory Visit (INDEPENDENT_AMBULATORY_CARE_PROVIDER_SITE_OTHER): Payer: Medicare Other

## 2014-03-14 DIAGNOSIS — J309 Allergic rhinitis, unspecified: Secondary | ICD-10-CM | POA: Diagnosis not present

## 2014-03-14 NOTE — Progress Notes (Signed)
Fax from [employer] that stated CIPRO 500mg  #6 BID was ordered for patient for abnormal UA. Fax placed on chart.

## 2014-03-15 ENCOUNTER — Other Ambulatory Visit: Payer: Self-pay | Admitting: Surgical

## 2014-03-15 NOTE — H&P (Signed)
TOTAL KNEE ADMISSION H&P  Patient is being admitted for left total knee arthroplasty.  Subjective:  Chief Complaint:left knee pain.  HPI: [name] [name], 80 y.o. female, has a history of pain and functional disability in the left knee due to arthritis and has failed non-surgical conservative treatments for greater than 12 weeks to includeNSAID's and/or analgesics, corticosteriod injections, viscosupplementation injections and activity modification.  Onset of symptoms was gradual, starting >10 years ago with gradually worsening course since that time. The patient noted no past surgery on the left knee(s).  Patient currently rates pain in the left knee(s) at 7 out of 10 with activity. Patient has night pain, worsening of pain with activity and weight bearing, pain that interferes with activities of daily living, pain with passive range of motion and crepitus.  Patient has evidence of periarticular osteophytes and joint space narrowing by imaging studies. There is no active infection.  Patient Active Problem List   Diagnosis Date Noted  . CHRONIC ETHMOIDAL SINUSITIS 01/15/2011  . Allergic rhinitis due to pollen 12/31/2010  . NECK PAIN 11/13/2010  . NEOP, MALIGNANT, BRONCHUS/LUNG NOS 08/19/2007  . ASTHMA, CHRONIC OBSTRUCTIVE NOS 08/19/2007  . ANGIOEDEMA 08/19/2007   Past Medical History  Diagnosis Date  . Chronic obstructive asthma, unspecified   . Allergic rhinitis, cause unspecified   . Angioneurotic edema not elsewhere classified   . Cancer 2006    lung ca  . Malignant neoplasm of bronchus and lung, unspecified site   . Environmental allergies   . Headache(784.0)   . Depression   . Arthritis   . Collapsed lung 1959    hx of with chest tube insertion  . Complication of anesthesia     severe headache x1    Past Surgical History  Procedure Laterality Date  . Right upper lobectomy  2006  . Abdominal hysterectomy    . Cholecystectomy  1969  . Closed manipulation shoulder Right  1990's    "frozen shoulder"    Current outpatient prescriptions: acetaminophen (TYLENOL) 500 MG tablet, Take 500-1,000 mg by mouth every 6 (six) hours as needed for mild pain or moderate pain. For pain., Disp: , Rfl: ;   albuterol (PROVENTIL HFA;VENTOLIN HFA) 108 (90 BASE) MCG/ACT inhaler, Inhale 1 puff into the lungs every 6 (six) hours as needed for wheezing or shortness of breath., Disp: , Rfl:  azithromycin (ZITHROMAX) 250 MG tablet, Take 1 tablet (250 mg total) by mouth as directed., Disp: 6 tablet, Rfl: 0;   calcium-vitamin D (OSCAL WITH D) 500-200 MG-UNIT per tablet, Take 1 tablet by mouth 2 (two) times daily., Disp: , Rfl: ;   diazepam (VALIUM) 5 MG tablet, Take 2.5 mg by mouth at bedtime as needed. For sleep., Disp: , Rfl:  ergocalciferol (VITAMIN D2) 50000 UNITS capsule, Take 50,000 Units by mouth every 30 (thirty) days. First Sunday of each month., Disp: , Rfl: ;   famotidine (PEPCID) 20 MG tablet, Take 20 mg by mouth daily.  , Disp: , Rfl: ;  Fluticasone-Salmeterol (ADVAIR) 250-50 MCG/DOSE AEPB, Inhale 1 puff into the lungs 2 (two) times daily., Disp: , Rfl:  loratadine-pseudoephedrine (CLARITIN-D 24-HOUR) 10-240 MG per 24 hr tablet, Take 1 tablet by mouth daily.  , Disp: , Rfl: ;   meloxicam (MOBIC) 15 MG tablet, Take 15 mg by mouth daily. , Disp: , Rfl: ;   metoprolol (TOPROL-XL) 50 MG 24 hr tablet, Take 50 mg by mouth every morning. , Disp: , Rfl: ;  mometasone (NASONEX) 50 MCG/ACT nasal spray,  Place 2 sprays into the nose daily., Disp: , Rfl:  Omega 3 1200 MG CAPS, Take 1 capsule by mouth daily., Disp: , Rfl: ;   predniSONE (DELTASONE) 10 MG tablet, Take 4 tabs po x 2 days, then 3 x 2 days, then 2 x 2 days, then 1 x 2 days then stop., Disp: 20 tablet, Rfl: 0;   sertraline (ZOLOFT) 25 MG tablet, Take 25 mg by mouth every evening. , Disp: , Rfl: ;   Allergy shots weekly, Disp: , Rfl:   Allergies  Allergen Reactions  . Aspirin     REACTION: GI upset in large amounts  . Codeine  Sulfate     REACTION: GI upset  . Penicillins Rash  . Septra Ds [Sulfamethoxazole-Trimethoprim] Rash    History  Substance Use Topics  . Smoking status: Former Smoker -- 1.25 packs/day for 50 years    Types: Cigarettes    Quit date: 11/04/1993  . Smokeless tobacco: Never Used  . Alcohol Use: No    Family History  Problem Relation Age of Onset  . Asthma    . Alzheimer's disease Mother   . Stroke Mother      Review of Systems  Constitutional: Negative for fever, chills, weight loss, malaise/fatigue and diaphoresis.  HENT: Positive for tinnitus. Negative for congestion, ear discharge, ear pain, hearing loss, nosebleeds and sore throat.   Eyes: Negative.   Respiratory: Positive for cough and wheezing. Negative for hemoptysis, sputum production, shortness of breath and stridor.   Cardiovascular: Negative.   Gastrointestinal: Positive for nausea, vomiting, abdominal pain and constipation. Negative for heartburn, diarrhea, blood in stool and melena.  Genitourinary: Positive for frequency. Negative for dysuria, urgency, hematuria and flank pain.  Musculoskeletal: Positive for back pain, joint pain and myalgias. Negative for falls and neck pain.       Left knee pain  Skin: Positive for itching. Negative for rash.  Neurological: Positive for weakness and headaches. Negative for dizziness, tingling, tremors, sensory change, speech change, focal weakness, seizures and loss of consciousness.  Endo/Heme/Allergies: Positive for environmental allergies. Negative for polydipsia. Does not bruise/bleed easily.  Psychiatric/Behavioral: Negative.     Objective:  Physical Exam  Constitutional: She is oriented to person, place, and time. She appears well-developed and well-nourished. No distress.  HENT:  Head: Normocephalic and atraumatic.  Right Ear: External ear normal.  Left Ear: External ear normal.  Nose: Nose normal.  Mouth/Throat: Oropharynx is clear and moist.  Eyes: Conjunctivae and  EOM are normal.  Neck: Normal range of motion. Neck supple.  Cardiovascular: Normal rate, regular rhythm, normal heart sounds and intact distal pulses.   No murmur heard. Respiratory: Effort normal. No respiratory distress. She has wheezes.  GI: Soft. Bowel sounds are normal. She exhibits no distension. There is no tenderness.  Musculoskeletal:       Right hip: Normal.       Left hip: Normal.       Right knee: She exhibits decreased range of motion. She exhibits no swelling, no effusion and no erythema. Tenderness found. Medial joint line and lateral joint line tenderness noted.       Left knee: Normal.       Right lower leg: She exhibits no tenderness and no swelling.       Left lower leg: She exhibits no tenderness and no swelling.  Evaluation of her left knee shows no swelling. Her range of motion is about 5 to 120. There is moderate crepitus on range of motion.  She is somewhat medial greater than lateral. There is no instability noted. The right knee exam is normal. Gait pattern is antalgic on the left.  Neurological: She is alert and oriented to person, place, and time. She has normal strength and normal reflexes. No sensory deficit.  Skin: No rash noted. She is not diaphoretic. No erythema.  Psychiatric: She has a normal mood and affect. Her behavior is normal.    Vitals Weight: 161 lb Height: 62 in Body Surface Area: 1.79 m Body Mass Index: 29.45 kg/m Pulse: 68 (Regular) BP: 134/74 (Sitting, Left Arm, Standard)  Imaging Review Plain radiographs demonstrate severe degenerative joint disease of the left knee(s). The overall alignment ismild varus. The bone quality appears to be adequate for age and reported activity level.  Assessment/Plan:  End stage arthritis, left knee   The patient history, physical examination, clinical judgment of the provider and imaging studies are consistent with end stage degenerative joint disease of the left knee(s) and total knee  arthroplasty is deemed medically necessary. The treatment options including medical management, injection therapy arthroscopy and arthroplasty were discussed at length. The risks and benefits of total knee arthroplasty were presented and reviewed. The risks due to aseptic loosening, infection, stiffness, patella tracking problems, thromboembolic complications and other imponderables were discussed. The patient acknowledged the explanation, agreed to proceed with the plan and consent was signed. Patient is being admitted for inpatient treatment for surgery, pain control, PT, OT, prophylactic antibiotics, VTE prophylaxis, progressive ambulation and ADL's and discharge planning. The patient is planning to be discharged to skilled nursing facility  Patient to receive [place], PA-C

## 2014-03-20 ENCOUNTER — Other Ambulatory Visit: Payer: Self-pay | Admitting: Internal Medicine

## 2014-03-21 ENCOUNTER — Inpatient Hospital Stay (HOSPITAL_COMMUNITY)
Admission: RE | Admit: 2014-03-21 | Discharge: 2014-03-24 | DRG: 470 | Disposition: A | Discharge status: Skilled Nursing Facility | Payer: Medicare Other | Source: Ambulatory Visit | Attending: Orthopedic Surgery | Admitting: Orthopedic Surgery

## 2014-03-21 ENCOUNTER — Encounter (HOSPITAL_COMMUNITY)
Admission: RE | Disposition: A | Discharge status: Skilled Nursing Facility | Payer: Self-pay | Source: Ambulatory Visit | Attending: Orthopedic Surgery

## 2014-03-21 ENCOUNTER — Inpatient Hospital Stay (HOSPITAL_COMMUNITY): Payer: Medicare Other | Admitting: Anesthesiology

## 2014-03-21 ENCOUNTER — Ambulatory Visit: Payer: Medicare Other

## 2014-03-21 ENCOUNTER — Encounter (HOSPITAL_COMMUNITY): Payer: Medicare Other | Admitting: Anesthesiology

## 2014-03-21 ENCOUNTER — Encounter (HOSPITAL_COMMUNITY): Payer: Self-pay

## 2014-03-21 DIAGNOSIS — J449 Chronic obstructive pulmonary disease, unspecified: Secondary | ICD-10-CM | POA: Diagnosis present

## 2014-03-21 DIAGNOSIS — Z96652 Presence of left artificial knee joint: Secondary | ICD-10-CM

## 2014-03-21 DIAGNOSIS — M1990 Unspecified osteoarthritis, unspecified site: Secondary | ICD-10-CM | POA: Diagnosis not present

## 2014-03-21 DIAGNOSIS — Z880 Allergy status to penicillin: Secondary | ICD-10-CM

## 2014-03-21 DIAGNOSIS — J322 Chronic ethmoidal sinusitis: Secondary | ICD-10-CM | POA: Diagnosis present

## 2014-03-21 DIAGNOSIS — E871 Hypo-osmolality and hyponatremia: Secondary | ICD-10-CM

## 2014-03-21 DIAGNOSIS — M25569 Pain in unspecified knee: Secondary | ICD-10-CM | POA: Diagnosis not present

## 2014-03-21 DIAGNOSIS — F329 Major depressive disorder, single episode, unspecified: Secondary | ICD-10-CM | POA: Diagnosis not present

## 2014-03-21 DIAGNOSIS — Z471 Aftercare following joint replacement surgery: Secondary | ICD-10-CM | POA: Diagnosis not present

## 2014-03-21 DIAGNOSIS — Z820 Family history of epilepsy and other diseases of the nervous system: Secondary | ICD-10-CM

## 2014-03-21 DIAGNOSIS — Z79899 Other long term (current) drug therapy: Secondary | ICD-10-CM | POA: Diagnosis not present

## 2014-03-21 DIAGNOSIS — Z886 Allergy status to analgesic agent status: Secondary | ICD-10-CM | POA: Diagnosis not present

## 2014-03-21 DIAGNOSIS — M179 Osteoarthritis of knee, unspecified: Secondary | ICD-10-CM

## 2014-03-21 DIAGNOSIS — J301 Allergic rhinitis due to pollen: Secondary | ICD-10-CM | POA: Diagnosis present

## 2014-03-21 DIAGNOSIS — Z823 Family history of stroke: Secondary | ICD-10-CM | POA: Diagnosis not present

## 2014-03-21 DIAGNOSIS — Z885 Allergy status to narcotic agent status: Secondary | ICD-10-CM

## 2014-03-21 DIAGNOSIS — Z881 Allergy status to other antibiotic agents status: Secondary | ICD-10-CM

## 2014-03-21 DIAGNOSIS — M6281 Muscle weakness (generalized): Secondary | ICD-10-CM | POA: Diagnosis not present

## 2014-03-21 DIAGNOSIS — Z85118 Personal history of other malignant neoplasm of bronchus and lung: Secondary | ICD-10-CM

## 2014-03-21 DIAGNOSIS — D62 Acute posthemorrhagic anemia: Secondary | ICD-10-CM

## 2014-03-21 DIAGNOSIS — Z87891 Personal history of nicotine dependence: Secondary | ICD-10-CM | POA: Diagnosis not present

## 2014-03-21 DIAGNOSIS — Z5189 Encounter for other specified aftercare: Secondary | ICD-10-CM | POA: Diagnosis not present

## 2014-03-21 DIAGNOSIS — M1710 Unilateral primary osteoarthritis, unspecified knee: Secondary | ICD-10-CM | POA: Diagnosis not present

## 2014-03-21 DIAGNOSIS — IMO0002 Reserved for concepts with insufficient information to code with codable children: Secondary | ICD-10-CM | POA: Diagnosis not present

## 2014-03-21 DIAGNOSIS — Z825 Family history of asthma and other chronic lower respiratory diseases: Secondary | ICD-10-CM

## 2014-03-21 DIAGNOSIS — M898X9 Other specified disorders of bone, unspecified site: Secondary | ICD-10-CM | POA: Diagnosis present

## 2014-03-21 DIAGNOSIS — J4489 Other specified chronic obstructive pulmonary disease: Secondary | ICD-10-CM | POA: Diagnosis present

## 2014-03-21 DIAGNOSIS — I10 Essential (primary) hypertension: Secondary | ICD-10-CM | POA: Diagnosis not present

## 2014-03-21 DIAGNOSIS — F3289 Other specified depressive episodes: Secondary | ICD-10-CM | POA: Diagnosis not present

## 2014-03-21 DIAGNOSIS — Z96659 Presence of unspecified artificial knee joint: Secondary | ICD-10-CM | POA: Diagnosis not present

## 2014-03-21 HISTORY — DX: Urinary tract infection, site not specified: N39.0

## 2014-03-21 HISTORY — PX: TOTAL KNEE ARTHROPLASTY: SHX125

## 2014-03-21 LAB — TYPE AND SCREEN
ABO/RH(D): O NEG
ANTIBODY SCREEN: NEGATIVE

## 2014-03-21 LAB — ABO/RH: ABO/RH(D): O NEG

## 2014-03-21 SURGERY — ARTHROPLASTY, KNEE, TOTAL
Anesthesia: Spinal | Site: Knee | Laterality: Left

## 2014-03-21 MED ORDER — BUPIVACAINE HCL (PF) 0.25 % IJ SOLN
INTRAMUSCULAR | Status: AC
  Filled 2014-03-21: qty 30

## 2014-03-21 MED ORDER — PROMETHAZINE HCL 25 MG/ML IJ SOLN: 6.2500 mg | INTRAMUSCULAR | Status: DC | PRN

## 2014-03-21 MED ORDER — DEXAMETHASONE SODIUM PHOSPHATE 10 MG/ML IJ SOLN
INTRAMUSCULAR | Status: AC
  Filled 2014-03-21: qty 1

## 2014-03-21 MED ORDER — METHOCARBAMOL 1000 MG/10ML IJ SOLN
500.0000 mg | Freq: Four times a day (QID) | INTRAVENOUS | Status: DC | PRN
  Administered 2014-03-21: 500 mg via INTRAVENOUS
  Filled 2014-03-21: qty 5

## 2014-03-21 MED ORDER — ACETAMINOPHEN 650 MG RE SUPP: 650.0000 mg | Freq: Four times a day (QID) | RECTAL | Status: DC | PRN

## 2014-03-21 MED ORDER — RIVAROXABAN 10 MG PO TABS
10.0000 mg | ORAL_TABLET | Freq: Every day | ORAL | Status: DC
  Administered 2014-03-22 – 2014-03-24 (×3): 10 mg via ORAL
  Filled 2014-03-21 (×4): qty 1

## 2014-03-21 MED ORDER — FLUTICASONE PROPIONATE 50 MCG/ACT NA SUSP
2.0000 | Freq: Every day | NASAL | Status: DC
  Administered 2014-03-22 – 2014-03-24 (×3): 2 via NASAL
  Filled 2014-03-21: qty 16

## 2014-03-21 MED ORDER — ACETAMINOPHEN 10 MG/ML IV SOLN
1000.0000 mg | Freq: Once | INTRAVENOUS | Status: AC
  Administered 2014-03-21: 1000 mg via INTRAVENOUS
  Filled 2014-03-21: qty 100

## 2014-03-21 MED ORDER — DEXAMETHASONE SODIUM PHOSPHATE 10 MG/ML IJ SOLN
10.0000 mg | Freq: Once | INTRAMUSCULAR | Status: AC
  Administered 2014-03-21: 10 mg via INTRAVENOUS

## 2014-03-21 MED ORDER — SODIUM CHLORIDE 0.9 % IV SOLN: INTRAVENOUS | Status: DC

## 2014-03-21 MED ORDER — PHENYLEPHRINE 40 MCG/ML (10ML) SYRINGE FOR IV PUSH (FOR BLOOD PRESSURE SUPPORT)
PREFILLED_SYRINGE | INTRAVENOUS | Status: AC
  Filled 2014-03-21: qty 10

## 2014-03-21 MED ORDER — VANCOMYCIN HCL IN DEXTROSE 1-5 GM/200ML-% IV SOLN
1000.0000 mg | INTRAVENOUS | Status: AC
  Administered 2014-03-21: 1000 mg via INTRAVENOUS

## 2014-03-21 MED ORDER — ALBUTEROL SULFATE (2.5 MG/3ML) 0.083% IN NEBU: 2.5000 mg | INHALATION_SOLUTION | Freq: Four times a day (QID) | RESPIRATORY_TRACT | Status: DC | PRN

## 2014-03-21 MED ORDER — FENTANYL CITRATE 0.05 MG/ML IJ SOLN
INTRAMUSCULAR | Status: DC | PRN
  Administered 2014-03-21 (×4): 25 ug via INTRAVENOUS

## 2014-03-21 MED ORDER — ONDANSETRON HCL 4 MG/2ML IJ SOLN
INTRAMUSCULAR | Status: AC
  Filled 2014-03-21: qty 2

## 2014-03-21 MED ORDER — METOCLOPRAMIDE HCL 10 MG PO TABS: 5.0000 mg | ORAL_TABLET | Freq: Three times a day (TID) | ORAL | Status: DC | PRN

## 2014-03-21 MED ORDER — MIDAZOLAM HCL 2 MG/2ML IJ SOLN
INTRAMUSCULAR | Status: AC
  Filled 2014-03-21: qty 2

## 2014-03-21 MED ORDER — BUPIVACAINE IN DEXTROSE 0.75-8.25 % IT SOLN
INTRATHECAL | Status: DC | PRN
  Administered 2014-03-21: 1.5 mL via INTRATHECAL

## 2014-03-21 MED ORDER — LORATADINE 10 MG PO TABS
10.0000 mg | ORAL_TABLET | Freq: Every day | ORAL | Status: DC
  Administered 2014-03-22 – 2014-03-24 (×3): 10 mg via ORAL
  Filled 2014-03-21 (×3): qty 1

## 2014-03-21 MED ORDER — ONDANSETRON HCL 4 MG/2ML IJ SOLN
INTRAMUSCULAR | Status: DC | PRN
  Administered 2014-03-21: 4 mg via INTRAVENOUS

## 2014-03-21 MED ORDER — MOMETASONE FURO-FORMOTEROL FUM 100-5 MCG/ACT IN AERO
2.0000 | INHALATION_SPRAY | Freq: Two times a day (BID) | RESPIRATORY_TRACT | Status: DC
  Administered 2014-03-22 – 2014-03-24 (×4): 2 via RESPIRATORY_TRACT
  Filled 2014-03-21: qty 8.8

## 2014-03-21 MED ORDER — MEPERIDINE HCL 50 MG/ML IJ SOLN: 6.2500 mg | INTRAMUSCULAR | Status: DC | PRN

## 2014-03-21 MED ORDER — BUPIVACAINE HCL 0.25 % IJ SOLN
INTRAMUSCULAR | Status: DC | PRN
  Administered 2014-03-21: 30 mL

## 2014-03-21 MED ORDER — METHOCARBAMOL 500 MG PO TABS
500.0000 mg | ORAL_TABLET | Freq: Four times a day (QID) | ORAL | Status: DC | PRN
  Administered 2014-03-22 – 2014-03-24 (×8): 500 mg via ORAL
  Filled 2014-03-21 (×8): qty 1

## 2014-03-21 MED ORDER — DIPHENHYDRAMINE HCL 12.5 MG/5ML PO ELIX: 12.5000 mg | ORAL_SOLUTION | ORAL | Status: DC | PRN

## 2014-03-21 MED ORDER — TRAMADOL HCL 50 MG PO TABS
50.0000 mg | ORAL_TABLET | Freq: Four times a day (QID) | ORAL | Status: DC | PRN
  Administered 2014-03-23: 100 mg via ORAL
  Filled 2014-03-21: qty 2

## 2014-03-21 MED ORDER — OXYCODONE HCL 5 MG PO TABS
5.0000 mg | ORAL_TABLET | ORAL | Status: DC | PRN
  Administered 2014-03-21 – 2014-03-24 (×15): 10 mg via ORAL
  Filled 2014-03-21 (×16): qty 2

## 2014-03-21 MED ORDER — METOPROLOL SUCCINATE ER 50 MG PO TB24
50.0000 mg | ORAL_TABLET | Freq: Every morning | ORAL | Status: DC
  Administered 2014-03-22 – 2014-03-24 (×3): 50 mg via ORAL
  Filled 2014-03-21 (×3): qty 1

## 2014-03-21 MED ORDER — VANCOMYCIN HCL IN DEXTROSE 1-5 GM/200ML-% IV SOLN
1000.0000 mg | Freq: Two times a day (BID) | INTRAVENOUS | Status: AC
  Administered 2014-03-21: 1000 mg via INTRAVENOUS
  Filled 2014-03-21: qty 200

## 2014-03-21 MED ORDER — ONDANSETRON HCL 4 MG PO TABS: 4.0000 mg | ORAL_TABLET | Freq: Four times a day (QID) | ORAL | Status: DC | PRN

## 2014-03-21 MED ORDER — DEXAMETHASONE 6 MG PO TABS
10.0000 mg | ORAL_TABLET | Freq: Every day | ORAL | Status: AC
  Administered 2014-03-22: 10 mg via ORAL
  Filled 2014-03-21: qty 1

## 2014-03-21 MED ORDER — SODIUM CHLORIDE 0.9 % IR SOLN
Status: DC | PRN
  Administered 2014-03-21: 1000 mL

## 2014-03-21 MED ORDER — FENTANYL CITRATE 0.05 MG/ML IJ SOLN: 25.0000 ug | INTRAMUSCULAR | Status: DC | PRN

## 2014-03-21 MED ORDER — PSEUDOEPHEDRINE HCL ER 120 MG PO TB12
120.0000 mg | ORAL_TABLET | Freq: Two times a day (BID) | ORAL | Status: DC
  Administered 2014-03-23: 120 mg via ORAL
  Filled 2014-03-21 (×7): qty 1

## 2014-03-21 MED ORDER — PHENYLEPHRINE HCL 10 MG/ML IJ SOLN
INTRAMUSCULAR | Status: AC
  Filled 2014-03-21: qty 2

## 2014-03-21 MED ORDER — DEXAMETHASONE SODIUM PHOSPHATE 10 MG/ML IJ SOLN
10.0000 mg | Freq: Every day | INTRAMUSCULAR | Status: AC
  Filled 2014-03-21: qty 1

## 2014-03-21 MED ORDER — SERTRALINE HCL 25 MG PO TABS
25.0000 mg | ORAL_TABLET | Freq: Every evening | ORAL | Status: DC
  Administered 2014-03-21 – 2014-03-24 (×4): 25 mg via ORAL
  Filled 2014-03-21 (×4): qty 1

## 2014-03-21 MED ORDER — VANCOMYCIN HCL IN DEXTROSE 1-5 GM/200ML-% IV SOLN
INTRAVENOUS | Status: AC
  Filled 2014-03-21: qty 200

## 2014-03-21 MED ORDER — PROPOFOL 10 MG/ML IV BOLUS
INTRAVENOUS | Status: DC | PRN
  Administered 2014-03-21: 20 mg via INTRAVENOUS

## 2014-03-21 MED ORDER — ACETAMINOPHEN 500 MG PO TABS
1000.0000 mg | ORAL_TABLET | Freq: Four times a day (QID) | ORAL | Status: AC
  Administered 2014-03-21 – 2014-03-22 (×4): 1000 mg via ORAL
  Filled 2014-03-21 (×4): qty 2

## 2014-03-21 MED ORDER — FLEET ENEMA 7-19 GM/118ML RE ENEM: 1.0000 | ENEMA | Freq: Once | RECTAL | Status: AC | PRN

## 2014-03-21 MED ORDER — ACETAMINOPHEN 325 MG PO TABS: 650.0000 mg | ORAL_TABLET | Freq: Four times a day (QID) | ORAL | Status: DC | PRN

## 2014-03-21 MED ORDER — DOCUSATE SODIUM 100 MG PO CAPS
100.0000 mg | ORAL_CAPSULE | Freq: Two times a day (BID) | ORAL | Status: DC
  Administered 2014-03-21 – 2014-03-24 (×6): 100 mg via ORAL

## 2014-03-21 MED ORDER — DIAZEPAM 5 MG PO TABS
2.5000 mg | ORAL_TABLET | Freq: Every evening | ORAL | Status: DC | PRN
  Administered 2014-03-21 – 2014-03-22 (×2): 2.5 mg via ORAL
  Filled 2014-03-21 (×2): qty 1

## 2014-03-21 MED ORDER — MENTHOL 3 MG MT LOZG: 1.0000 | LOZENGE | OROMUCOSAL | Status: DC | PRN

## 2014-03-21 MED ORDER — 0.9 % SODIUM CHLORIDE (POUR BTL) OPTIME
TOPICAL | Status: DC | PRN
  Administered 2014-03-21: 1000 mL

## 2014-03-21 MED ORDER — ALBUTEROL SULFATE HFA 108 (90 BASE) MCG/ACT IN AERS: 1.0000 | INHALATION_SPRAY | Freq: Four times a day (QID) | RESPIRATORY_TRACT | Status: DC | PRN

## 2014-03-21 MED ORDER — PROPOFOL 10 MG/ML IV BOLUS
INTRAVENOUS | Status: AC
  Filled 2014-03-21: qty 20

## 2014-03-21 MED ORDER — POLYETHYLENE GLYCOL 3350 17 G PO PACK: 17.0000 g | PACK | Freq: Every day | ORAL | Status: DC | PRN

## 2014-03-21 MED ORDER — ONDANSETRON HCL 4 MG/2ML IJ SOLN: 4.0000 mg | Freq: Four times a day (QID) | INTRAMUSCULAR | Status: DC | PRN

## 2014-03-21 MED ORDER — LORATADINE-PSEUDOEPHEDRINE ER 10-240 MG PO TB24: 1.0000 | ORAL_TABLET | Freq: Every day | ORAL | Status: DC

## 2014-03-21 MED ORDER — LACTATED RINGERS IV SOLN
INTRAVENOUS | Status: DC
  Administered 2014-03-21: 12:00:00 via INTRAVENOUS
  Administered 2014-03-21: 1000 mL via INTRAVENOUS

## 2014-03-21 MED ORDER — SODIUM CHLORIDE 0.9 % IV SOLN
20.0000 mg | INTRAVENOUS | Status: DC | PRN
  Administered 2014-03-21: 50 ug/min via INTRAVENOUS

## 2014-03-21 MED ORDER — PHENOL 1.4 % MT LIQD: 1.0000 | OROMUCOSAL | Status: DC | PRN

## 2014-03-21 MED ORDER — METOCLOPRAMIDE HCL 5 MG/ML IJ SOLN: 5.0000 mg | Freq: Three times a day (TID) | INTRAMUSCULAR | Status: DC | PRN

## 2014-03-21 MED ORDER — BUPIVACAINE LIPOSOME 1.3 % IJ SUSP
20.0000 mL | Freq: Once | INTRAMUSCULAR | Status: DC
  Filled 2014-03-21: qty 20

## 2014-03-21 MED ORDER — SODIUM CHLORIDE 0.9 % IJ SOLN
INTRAMUSCULAR | Status: AC
  Filled 2014-03-21: qty 50

## 2014-03-21 MED ORDER — FAMOTIDINE 20 MG PO TABS
20.0000 mg | ORAL_TABLET | Freq: Every day | ORAL | Status: DC
  Administered 2014-03-22 – 2014-03-24 (×3): 20 mg via ORAL
  Filled 2014-03-21 (×3): qty 1

## 2014-03-21 MED ORDER — EPHEDRINE SULFATE 50 MG/ML IJ SOLN
INTRAMUSCULAR | Status: AC
  Filled 2014-03-21: qty 1

## 2014-03-21 MED ORDER — BISACODYL 10 MG RE SUPP: 10.0000 mg | Freq: Every day | RECTAL | Status: DC | PRN

## 2014-03-21 MED ORDER — MORPHINE SULFATE 2 MG/ML IJ SOLN
1.0000 mg | INTRAMUSCULAR | Status: DC | PRN
  Administered 2014-03-21 (×2): 1 mg via INTRAVENOUS
  Filled 2014-03-21 (×2): qty 1

## 2014-03-21 MED ORDER — FENTANYL CITRATE 0.05 MG/ML IJ SOLN
INTRAMUSCULAR | Status: AC
Start: 2014-03-21 — End: 2014-03-21
  Filled 2014-03-21: qty 2

## 2014-03-21 MED ORDER — SODIUM CHLORIDE 0.9 % IV SOLN
1000.0000 mg | INTRAVENOUS | Status: DC
  Filled 2014-03-21: qty 10

## 2014-03-21 MED ORDER — PROPOFOL INFUSION 10 MG/ML OPTIME
INTRAVENOUS | Status: DC | PRN
  Administered 2014-03-21: 140 ug/kg/min via INTRAVENOUS

## 2014-03-21 MED ORDER — SODIUM CHLORIDE 0.9 % IV SOLN
INTRAVENOUS | Status: DC
  Administered 2014-03-21: 14:00:00 via INTRAVENOUS
  Administered 2014-03-22 (×2): 30 mL/h via INTRAVENOUS

## 2014-03-21 MED ORDER — BUPIVACAINE LIPOSOME 1.3 % IJ SUSP
INTRAMUSCULAR | Status: DC | PRN
  Administered 2014-03-21: 20 mL

## 2014-03-21 MED ORDER — SODIUM CHLORIDE 0.9 % IJ SOLN
INTRAMUSCULAR | Status: DC | PRN
  Administered 2014-03-21: 30 mL

## 2014-03-21 SURGICAL SUPPLY — 60 items
BAG SPEC THK2 15X12 ZIP CLS (MISCELLANEOUS) ×1
BAG ZIPLOCK 12X15 (MISCELLANEOUS) ×3 IMPLANT
BANDAGE ELASTIC 6 VELCRO ST LF (GAUZE/BANDAGES/DRESSINGS) ×3 IMPLANT
BANDAGE ESMARK 6X9 LF (GAUZE/BANDAGES/DRESSINGS) ×1 IMPLANT
BLADE SAG 18X100X1.27 (BLADE) ×3 IMPLANT
BLADE SAW SGTL 11.0X1.19X90.0M (BLADE) ×3 IMPLANT
BNDG CMPR 9X6 STRL LF SNTH (GAUZE/BANDAGES/DRESSINGS) ×1
BNDG ESMARK 6X9 LF (GAUZE/BANDAGES/DRESSINGS) ×3
BOWL SMART MIX CTS (DISPOSABLE) ×3 IMPLANT
CAPT RP KNEE ×2 IMPLANT
CEMENT HV SMART SET (Cement) ×6 IMPLANT
CLOSURE WOUND 1/2 X4 (GAUZE/BANDAGES/DRESSINGS) ×2
CUFF TOURN SGL QUICK 34 (TOURNIQUET CUFF) ×3
CUFF TRNQT CYL 34X4X40X1 (TOURNIQUET CUFF) ×1 IMPLANT
DECANTER SPIKE VIAL GLASS SM (MISCELLANEOUS) ×3 IMPLANT
DRAPE EXTREMITY T 121X128X90 (DRAPE) ×3 IMPLANT
DRAPE POUCH INSTRU U-SHP 10X18 (DRAPES) ×3 IMPLANT
DRAPE U-SHAPE 47X51 STRL (DRAPES) ×3 IMPLANT
DRSG ADAPTIC 3X8 NADH LF (GAUZE/BANDAGES/DRESSINGS) ×3 IMPLANT
DRSG PAD ABDOMINAL 8X10 ST (GAUZE/BANDAGES/DRESSINGS) ×3 IMPLANT
DURAPREP 26ML APPLICATOR (WOUND CARE) ×3 IMPLANT
ELECT REM PT RETURN 9FT ADLT (ELECTROSURGICAL) ×3
ELECTRODE REM PT RTRN 9FT ADLT (ELECTROSURGICAL) ×1 IMPLANT
EVACUATOR 1/8 PVC DRAIN (DRAIN) ×3 IMPLANT
FACESHIELD WRAPAROUND (MASK) ×15 IMPLANT
FACESHIELD WRAPAROUND OR TEAM (MASK) ×5 IMPLANT
GLOVE BIO SURGEON STRL SZ8 (GLOVE) ×3 IMPLANT
GLOVE BIOGEL PI IND STRL 6.5 (GLOVE) IMPLANT
GLOVE BIOGEL PI IND STRL 8 (GLOVE) ×1 IMPLANT
GLOVE BIOGEL PI INDICATOR 6.5 (GLOVE) ×2
GLOVE BIOGEL PI INDICATOR 8 (GLOVE) ×2
GLOVE SURG SS PI 6.5 STRL IVOR (GLOVE) ×3 IMPLANT
GOWN STRL REUS W/TWL 2XL LVL3 (GOWN DISPOSABLE) ×3 IMPLANT
GOWN STRL REUS W/TWL LRG LVL3 (GOWN DISPOSABLE) ×5 IMPLANT
GOWN STRL REUS W/TWL XL LVL3 (GOWN DISPOSABLE) ×6 IMPLANT
HANDPIECE INTERPULSE COAX TIP (DISPOSABLE) ×3
IMMOBILIZER KNEE 20 (SOFTGOODS) ×3 IMPLANT
KIT BASIN OR (CUSTOM PROCEDURE TRAY) ×3 IMPLANT
MANIFOLD NEPTUNE II (INSTRUMENTS) ×3 IMPLANT
NDL SAFETY ECLIPSE 18X1.5 (NEEDLE) ×2 IMPLANT
NEEDLE HYPO 18GX1.5 SHARP (NEEDLE) ×6
NS IRRIG 1000ML POUR BTL (IV SOLUTION) ×3 IMPLANT
PACK TOTAL JOINT (CUSTOM PROCEDURE TRAY) ×3 IMPLANT
PADDING CAST COTTON 6X4 STRL (CAST SUPPLIES) ×9 IMPLANT
POSITIONER SURGICAL ARM (MISCELLANEOUS) ×3 IMPLANT
SET HNDPC FAN SPRY TIP SCT (DISPOSABLE) ×1 IMPLANT
SPONGE GAUZE 4X4 12PLY (GAUZE/BANDAGES/DRESSINGS) ×3 IMPLANT
STRIP CLOSURE SKIN 1/2X4 (GAUZE/BANDAGES/DRESSINGS) ×4 IMPLANT
SUCTION FRAZIER 12FR DISP (SUCTIONS) ×3 IMPLANT
SUT MNCRL AB 4-0 PS2 18 (SUTURE) ×3 IMPLANT
SUT VIC AB 2-0 CT1 27 (SUTURE) ×9
SUT VIC AB 2-0 CT1 TAPERPNT 27 (SUTURE) ×3 IMPLANT
SUT VLOC 180 0 24IN GS25 (SUTURE) ×3 IMPLANT
SYR 20CC LL (SYRINGE) ×3 IMPLANT
SYR 50ML LL SCALE MARK (SYRINGE) ×3 IMPLANT
TOWEL OR 17X26 10 PK STRL BLUE (TOWEL DISPOSABLE) ×3 IMPLANT
TOWEL OR NON WOVEN STRL DISP B (DISPOSABLE) ×3 IMPLANT
TRAY FOLEY CATH 14FRSI W/METER (CATHETERS) ×3 IMPLANT
WATER STERILE IRR 1500ML POUR (IV SOLUTION) ×3 IMPLANT
WRAP KNEE MAXI GEL POST OP (GAUZE/BANDAGES/DRESSINGS) ×3 IMPLANT

## 2014-03-21 NOTE — Anesthesia Postprocedure Evaluation (Signed)
  Anesthesia Post-op Note  Patient: [name] [name]  Procedure(s) Performed: Procedure(s) (LRB): LEFT TOTAL KNEE ARTHROPLASTY (Left)  Patient Location: PACU  Anesthesia Type: Spinal  Level of Consciousness: awake and alert   Airway and Oxygen Therapy: Patient Spontanous Breathing  Post-op Pain: mild  Post-op Assessment: Post-op Vital signs reviewed, Patient's Cardiovascular Status Stable, Respiratory Function Stable, Patent Airway and No signs of Nausea or vomiting  Last Vitals:  Filed Vitals:   03/21/14 1610  BP: 128/71  Pulse: 66  Temp: 36.7 C  Resp: 16    Post-op Vital Signs: stable   Complications: No apparent anesthesia complications

## 2014-03-21 NOTE — Transfer of Care (Signed)
Immediate Anesthesia Transfer of Care Note  Patient: [name] [name]  Procedure(s) Performed: Procedure(s): LEFT TOTAL KNEE ARTHROPLASTY (Left)  Patient Location: PACU  Anesthesia Type:Spinal  Level of Consciousness: awake, alert  and oriented  Airway & Oxygen Therapy: Patient Spontanous Breathing and Patient connected to nasal cannula oxygen  Post-op Assessment: Report given to PACU RN and Post -op Vital signs reviewed and stable  Post vital signs: Reviewed and stable  Complications: No apparent anesthesia complications

## 2014-03-21 NOTE — Progress Notes (Signed)
Clinical Social Work Department BRIEF PSYCHOSOCIAL ASSESSMENT 03/21/2014  Patient:  [name] [name], [name] [name]     Account Number:  [id number]     Admit date:  03/21/2014  Clinical Social Worker:  Lacie Scotts  Date/Time:  03/21/2014 03:32 PM  Referred by:  Physician  Date Referred:  03/21/2014 Referred for  SNF Placement   Other Referral:   Interview type:  Patient Other interview type:    PSYCHOSOCIAL DATA Living Status:  ALONE Admitted from facility:   Level of care:   Primary support name:  Ranae Palms Primary support relationship to patient:  FAMILY Degree of support available:   unclear    CURRENT CONCERNS Current Concerns  Post-Acute Placement   Other Concerns:    SOCIAL WORK ASSESSMENT / PLAN Pt is an 80 yr old female living at home prior to hospitalization. CSW met with pt to assist with d/c planning. This is a planned admission. Pt will need ST Rehab following hospital d/c. SNF search has been initiated and bed offers will be provided as received. CSW will continue to follow to assist with d/c planning needs.   Assessment/plan status:  Psychosocial Support/Ongoing Assessment of Needs Other assessment/ plan:   Information/referral to community resources:   Insurance coverage for SNF and ambulance transport reviewed.    PATIENT'S/FAMILY'S RESPONSE TO PLAN OF CARE: Pt's mood is bright. She is relieved her surgery is over. Pt has never been to rehab. She is looking forward to reviewing SNF bed offers and choosing placement.   Werner Lean LCSW [phone]

## 2014-03-21 NOTE — Interval H&P Note (Signed)
History and Physical Interval Note:  03/21/2014 9:23 AM  [name] [name]  has presented today for surgery, with the diagnosis of left knee osteoarthritis  The various methods of treatment have been discussed with the patient and family. After consideration of risks, benefits and other options for treatment, the patient has consented to  Procedure(s): LEFT TOTAL KNEE ARTHROPLASTY (Left) as a surgical intervention .  The patient's history has been reviewed, patient examined, no change in status, stable for surgery.  I have reviewed the patient's chart and labs.  Questions were answered to the patient's satisfaction.     Dione Plover Kynsli Haapala

## 2014-03-21 NOTE — Plan of Care (Signed)
Problem: Consults Goal: Diagnosis- Total Joint Replacement Right total knee     

## 2014-03-21 NOTE — Progress Notes (Signed)
Patient states she completed cipro for UTI

## 2014-03-21 NOTE — Op Note (Signed)
Pre-operative diagnosis- Osteoarthritis  Left knee(s)  Post-operative diagnosis- Osteoarthritis Left knee(s)  Procedure-  Left  Total Knee Arthroplasty  Surgeon- Dione Plover. Brealyn Baril, MD  Assistant- Ardeen Jourdain, PA-C   Anesthesia-  Spinal  EBL-* No blood loss amount entered *   Drains Hemovac  Tourniquet time-  Total Tourniquet Time Documented: Thigh (Left) - 32 minutes Total: Thigh (Left) - 32 minutes     Complications- None  Condition-PACU - hemodynamically stable.   Brief Clinical Note  [name] [name] is a 80 y.o. year old female with end stage OA of her left knee with progressively worsening pain and dysfunction. She has constant pain, with activity and at rest and significant functional deficits with difficulties even with ADLs. She has had extensive non-op management including analgesics, injections of cortisone and viscosupplements, and home exercise program, but remains in significant pain with significant dysfunction. Radiographs show bone on bone arthritis medial and patellofemoral. She presents now for left Total Knee Arthroplasty.    Procedure in detail---   The patient is brought into the operating room and positioned supine on the operating table. After successful administration of  Spinal,   a tourniquet is placed high on the  Left thigh(s) and the lower extremity is prepped and draped in the usual sterile fashion. Time out is performed by the operating team and then the  Left lower extremity is wrapped in Esmarch, knee flexed and the tourniquet inflated to 300 mmHg.       A midline incision is made with a ten blade through the subcutaneous tissue to the level of the extensor mechanism. A fresh blade is used to make a medial parapatellar arthrotomy. Soft tissue over the proximal medial tibia is subperiosteally elevated to the joint line with a knife and into the semimembranosus bursa with a Cobb elevator. Soft tissue over the proximal lateral tibia is elevated with  attention being paid to avoiding the patellar tendon on the tibial tubercle. The patella is everted, knee flexed 90 degrees and the ACL and PCL are removed. Findings are bone on bone medial and patellofemoral with large medial osteophytes.        The drill is used to create a starting hole in the distal femur and the canal is thoroughly irrigated with sterile saline to remove the fatty contents. The 5 degree Left  valgus alignment guide is placed into the femoral canal and the distal femoral cutting block is pinned to remove 10 mm off the distal femur. Resection is made with an oscillating saw.      The tibia is subluxed forward and the menisci are removed. The extramedullary alignment guide is placed referencing proximally at the medial aspect of the tibial tubercle and distally along the second metatarsal axis and tibial crest. The block is pinned to remove 54mm off the more deficient medial  side. Resection is made with an oscillating saw. Size 3is the most appropriate size for the tibia and the proximal tibia is prepared with the modular drill and keel punch for that size.      The femoral sizing guide is placed and size 3 is most appropriate. Rotation is marked off the epicondylar axis and confirmed by creating a rectangular flexion gap at 90 degrees. The size 3 cutting block is pinned in this rotation and the anterior, posterior and chamfer cuts are made with the oscillating saw. The intercondylar block is then placed and that cut is made.      Trial size 3 tibial component,  trial size 3 posterior stabilized femur and a 12.5  mm posterior stabilized rotating platform insert trial is placed. Full extension is achieved with excellent varus/valgus and anterior/posterior balance throughout full range of motion. The patella is everted and thickness measured to be 22  mm. Free hand resection is taken to 12 mm, a 35 template is placed, lug holes are drilled, trial patella is placed, and it tracks normally.  Osteophytes are removed off the posterior femur with the trial in place. All trials are removed and the cut bone surfaces prepared with pulsatile lavage. Cement is mixed and once ready for implantation, the size 3 tibial implant, size  3 posterior stabilized femoral component, and the size 35 patella are cemented in place and the patella is held with the clamp. The trial insert is placed and the knee held in full extension. The Exparel (20 ml mixed with 30 ml saline) and .25% Bupivicaine, are injected into the extensor mechanism, posterior capsule, medial and lateral gutters and subcutaneous tissues.  All extruded cement is removed and once the cement is hard the permanent 12.5 mm posterior stabilized rotating platform insert is placed into the tibial tray.      The wound is copiously irrigated with saline solution and the extensor mechanism closed over a hemovac drain with #1 V-loc suture. The tourniquet is released for a total tourniquet time of 32  minutes. Flexion against gravity is 140 degrees and the patella tracks normally. Subcutaneous tissue is closed with 2.0 vicryl and subcuticular with running 4.0 Monocryl. The incision is cleaned and dried and steri-strips and a bulky sterile dressing are applied. The limb is placed into a knee immobilizer and the patient is awakened and transported to recovery in stable condition.      Please note that a surgical assistant was a medical necessity for this procedure in order to perform it in a safe and expeditious manner. Surgical assistant was necessary to retract the ligaments and vital neurovascular structures to prevent injury to them and also necessary for proper positioning of the limb to allow for anatomic placement of the prosthesis.   Dione Plover Lisabeth Mian, MD    03/21/2014, 12:23 PM

## 2014-03-21 NOTE — Anesthesia Preprocedure Evaluation (Addendum)
Anesthesia Evaluation  Patient identified by MRN, date of birth, ID band Patient awake    Reviewed: Allergy & Precautions, H&P , NPO status , Patient's Chart, lab work & pertinent test results  Airway Mallampati: II TM Distance: >3 FB Neck ROM: Full    Dental no notable dental hx.    Pulmonary asthma , COPDformer smoker,  breath sounds clear to auscultation  Pulmonary exam normal       Cardiovascular negative cardio ROS  Rhythm:Regular Rate:Normal     Neuro/Psych negative neurological ROS  negative psych ROS   GI/Hepatic negative GI ROS, Neg liver ROS,   Endo/Other  negative endocrine ROS  Renal/GU negative Renal ROS  negative genitourinary   Musculoskeletal negative musculoskeletal ROS (+)   Abdominal   Peds negative pediatric ROS (+)  Hematology negative hematology ROS (+)   Anesthesia Other Findings   Reproductive/Obstetrics negative OB ROS                          Anesthesia Physical Anesthesia Plan  ASA: II  Anesthesia Plan: Spinal   Post-op Pain Management:    Induction: Intravenous  Airway Management Planned: Simple Face Mask  Additional Equipment:   Intra-op Plan:   Post-operative Plan:   Informed Consent: I have reviewed the patients History and Physical, chart, labs and discussed the procedure including the risks, benefits and alternatives for the proposed anesthesia with the patient or authorized representative who has indicated his/her understanding and acceptance.   Dental advisory given  Plan Discussed with: CRNA  Anesthesia Plan Comments:         Anesthesia Quick Evaluation

## 2014-03-21 NOTE — Progress Notes (Signed)
Clinical Social Work Department CLINICAL SOCIAL WORK PLACEMENT NOTE 03/21/2014  Patient:  [name] [name], [name] [name]  Account Number:  [id number] Admit date:  03/21/2014  Clinical Social Worker:  Werner Lean, LCSW  Date/time:  03/21/2014 03:43 PM  Clinical Social Work is seeking post-discharge placement for this patient at the following level of care:   SKILLED NURSING   (*CSW will update this form in Epic as items are completed)   03/21/2014  Patient/family provided with [facility] Department of Clinical Social Work's list of facilities offering this level of care within the geographic area requested by the patient (or if unable, by the patient's family).  03/21/2014  Patient/family informed of their freedom to choose among providers that offer the needed level of care, that participate in Medicare, Medicaid or managed care program needed by the patient, have an available bed and are willing to accept the patient.    Patient/family informed of [facility]' ownership interest in [facility], as well as of the fact that they are under no obligation to receive care at this facility.  PASARR submitted to EDS on 03/21/2014 PASARR number received from EDS on 03/21/2014  FL2 transmitted to all facilities in geographic area requested by pt/family on  03/21/2014 FL2 transmitted to all facilities within larger geographic area on   Patient informed that his/her managed care company has contracts with or will negotiate with  certain facilities, including the following:     Patient/family informed of bed offers received:   Patient chooses bed at  Physician recommends and patient chooses bed at    Patient to be transferred to  on   Patient to be transferred to facility by   The following physician request were entered in Epic:   Additional Comments:  Werner Lean  LCSW [phone]

## 2014-03-21 NOTE — Progress Notes (Signed)
Utilization review completed.  

## 2014-03-21 NOTE — Anesthesia Procedure Notes (Signed)
Spinal  Patient location during procedure: OR Staffing Anesthesiologist: Dharma Pare Performed by: anesthesiologist  Preanesthetic Checklist Completed: patient identified, site marked, surgical consent, pre-op evaluation, timeout performed, IV checked, risks and benefits discussed and monitors and equipment checked Spinal Block Patient position: sitting Prep: Betadine Patient monitoring: heart rate, continuous pulse ox and blood pressure Approach: right paramedian Location: L3-4 Injection technique: single-shot Needle Needle type: Spinocan  Needle gauge: 22 G Needle length: 9 cm Additional Notes Expiration date of kit checked and confirmed. Patient tolerated procedure well, without complications.     

## 2014-03-22 DIAGNOSIS — E871 Hypo-osmolality and hyponatremia: Secondary | ICD-10-CM | POA: Diagnosis not present

## 2014-03-22 DIAGNOSIS — D62 Acute posthemorrhagic anemia: Secondary | ICD-10-CM | POA: Diagnosis not present

## 2014-03-22 LAB — BASIC METABOLIC PANEL
BUN: 18 mg/dL (ref 6–23)
CALCIUM: 8.4 mg/dL (ref 8.4–10.5)
CHLORIDE: 98 meq/L (ref 96–112)
CO2: 28 meq/L (ref 19–32)
Creatinine, Ser: 0.77 mg/dL (ref 0.50–1.10)
GFR calc Af Amer: 86 mL/min — ABNORMAL LOW (ref >90)
GFR calc non Af Amer: 74 mL/min — ABNORMAL LOW (ref >90)
Glucose, Bld: 146 mg/dL — ABNORMAL HIGH (ref 70–99)
POTASSIUM: 4.8 meq/L (ref 3.7–5.3)
Sodium: 134 mEq/L — ABNORMAL LOW (ref 137–147)

## 2014-03-22 LAB — CBC
HCT: 29.6 % — ABNORMAL LOW (ref 36.0–46.0)
Hemoglobin: 9.8 g/dL — ABNORMAL LOW (ref 12.0–15.0)
MCH: 31 pg (ref 26.0–34.0)
MCHC: 33.1 g/dL (ref 30.0–36.0)
MCV: 93.7 fL (ref 78.0–100.0)
PLATELETS: 233 10*3/uL (ref 150–400)
RBC: 3.16 MIL/uL — AB (ref 3.87–5.11)
RDW: 13 % (ref 11.5–15.5)
WBC: 15.6 10*3/uL — AB (ref 4.0–10.5)

## 2014-03-22 NOTE — Care Management Note (Signed)
    Page 1 of 1   03/22/2014     8:14:09 AM CARE MANAGEMENT NOTE 03/22/2014  Patient:  LIZZIE, AN   Account Number:  [id number]  Date Initiated:  03/22/2014  Documentation initiated by:  [facility]  Subjective/Objective Assessment:   adm: L knee pain;L TOTAL KNEE ARTHROPLASTY     Action/Plan:   discharge to SNF for Rehab   Anticipated DC Date:  03/24/2014   Anticipated DC Plan:  [place]  CM consult      Choice offered to / List presented to:             Status of service:  Completed, signed off Medicare Important Message given?  YES (If response is "NO", the following Medicare IM given date fields will be blank) Date Medicare IM given:  03/10/2014 Date Additional Medicare IM given:    Discharge Disposition:  [place]  Per UR Regulation:    If discussed at Long Length of Stay Meetings, dates discussed:    Comments:  03/22/14 08:10 CM reviewed; CSW arranging SNF placement.  IM from [facility] signed pre-admission.  No other CM needs were communicated.  Mariane Masters, BSN, CM [phone].

## 2014-03-22 NOTE — Evaluation (Signed)
Physical Therapy Evaluation Patient Details Name: CAMRI MOLLOY MRN: [id number] DOB: [birth year] Today's Date: 03/22/2014   History of Present Illness  80 yo female s/p L TKA. Hx of lung cancer, asthma  Clinical Impression  On eval, pt required Mod assist for mobility-able to take a few steps in room with walker. Demonstrates general weakness, decreased activity tolerance, and impaired gait and balance. Dyspnea noted with minimal activity. Pt will need ST rehab to regain independence with functional mobility.     Follow Up Recommendations SNF    Equipment Recommendations  None recommended by PT    Recommendations for Other Services       Precautions / Restrictions Precautions Precautions: Fall Required Braces or Orthoses: Knee Immobilizer - Left Knee Immobilizer - Left: Discontinue once straight leg raise with < 10 degree lag Restrictions Weight Bearing Restrictions: No LLE Weight Bearing: Weight bearing as tolerated      Mobility  Bed Mobility Overal bed mobility: Needs Assistance Bed Mobility: Supine to Sit     Supine to sit: Min assist     General bed mobility comments: Assist for R LE. Increased time. Rest break needed once pt reached EOB  Transfers Overall transfer level: Needs assistance Equipment used: Rolling walker (2 wheeled) Transfers: Sit to/from [facility] Sit to Stand: Mod assist;From elevated surface Stand pivot transfers: Min assist       General transfer comment: assist to rise, stabilize,control descent. VCs safety, technique, hand placement.   Ambulation/Gait   Ambulation Distance (Feet): 3 Feet Assistive device: Rolling walker (2 wheeled) Gait Pattern/deviations: Trunk flexed;Antalgic;Decreased stride length;Step-to pattern;Decreased weight shift to left;Decreased stance time - left;Decreased step length - left;Decreased step length - right     General Gait Details: VCs safety, technique, sequence, technique. Assist to  stabilize/support pt throughout. Fatigues quickly.   Stairs            Wheelchair Mobility    Modified Rankin (Stroke Patients Only)       Balance                                             Pertinent Vitals/Pain L knee 7/10 with activity. Ice applied end of session    Home Living Family/patient expects to be discharged to:: Skilled nursing facility Living Arrangements: Alone                    Prior Function                 Hand Dominance        Extremity/Trunk Assessment   Upper Extremity Assessment: Generalized weakness           Lower Extremity Assessment: LLE deficits/detail   LLE Deficits / Details: hip flex 2/5, hip abd/add-all llimted by pain. moves ankle well  Cervical / Trunk Assessment: Normal  Communication   Communication: No difficulties  Cognition Arousal/Alertness: Awake/alert Behavior During Therapy: Anxious Overall Cognitive Status: Within Functional Limits for tasks assessed                      General Comments      Exercises        Assessment/Plan    PT Assessment Patient needs continued PT services  PT Diagnosis Difficulty walking;Acute pain;Abnormality of gait;Generalized weakness   PT Problem List Decreased strength;Decreased range of motion;Decreased activity tolerance;Decreased  balance;Decreased mobility;Pain;Decreased knowledge of use of DME;Decreased knowledge of precautions  PT Treatment Interventions DME instruction;Gait training;Functional mobility training;Therapeutic activities;Therapeutic exercise;Patient/family education;Balance training   PT Goals (Current goals can be found in the Care Plan section) Acute Rehab PT Goals Patient Stated Goal: rehab to get stronger PT Goal Formulation: With patient Time For Goal Achievement: 03/29/14 Potential to Achieve Goals: Good    Frequency 7X/week   Barriers to discharge        Co-evaluation               End of  Session Equipment Utilized During Treatment: Gait belt Activity Tolerance: Patient limited by fatigue;Patient limited by pain Patient left: in chair;with call bell/phone within reach           Time: 0940-0955 PT Time Calculation (min): 15 min   Charges:   PT Evaluation $Initial PT Evaluation Tier I: 1 Procedure PT Treatments $Therapeutic Activity: 8-22 mins   PT G Codes:          Weston Anna, MPT Pager: [phone]

## 2014-03-22 NOTE — Progress Notes (Signed)
CSW met with pt today to review SNF bed offers. Pt has chosen [facility] for [employer]. SNF will have and opening for pt when she is stable for d/c. CSW will continue to follow to assist with d/c planning to SNF.  Werner Lean LCSW [phone]

## 2014-03-22 NOTE — Progress Notes (Signed)
Physical Therapy Treatment Patient Details Name: [name] [name] MRN: [id number] DOB: [birth year] Today's Date: 03/22/2014    History of Present Illness 80 yo female s/p L TKA. Hx of lung cancer, asthma    PT Comments    Progressing slowly with mobility. Plan is for SNF  Follow Up Recommendations  SNF     Equipment Recommendations  None recommended by PT    Recommendations for Other Services       Precautions / Restrictions Precautions Precautions: Knee;Fall Required Braces or Orthoses: Knee Immobilizer - Left Knee Immobilizer - Left: Discontinue once straight leg raise with < 10 degree lag Restrictions Weight Bearing Restrictions: No LLE Weight Bearing: Weight bearing as tolerated    Mobility  Bed Mobility Overal bed mobility: Needs Assistance Bed Mobility: Sit to Supine       Sit to supine: Min assist   General bed mobility comments: Assist for R LE.   Transfers Overall transfer level: Needs assistance Equipment used: Rolling walker (2 wheeled) Transfers: Sit to/from Stand Sit to Stand: Mod assist Stand pivot transfers: Min assist       General transfer comment: assist to rise, stabilize,control descent. VCs safety, technique, hand placement.   Ambulation/Gait Ambulation/Gait assistance: Min assist Ambulation Distance (Feet): 18 Feet Assistive device: Rolling walker (2 wheeled) Gait Pattern/deviations: Step-to pattern;Antalgic;Trunk flexed;Decreased stride length;Decreased step length - left;Decreased step length - right;Decreased weight shift to left;Decreased stance time - left     General Gait Details: VCs safety, technique, sequence, technique. Assist to stabilize/support pt throughout. Fatigues quickly.    Stairs            Wheelchair Mobility    Modified Rankin (Stroke Patients Only)       Balance                                    Cognition Arousal/Alertness: Awake/alert Behavior During Therapy: WFL for tasks  assessed/performed Overall Cognitive Status: Within Functional Limits for tasks assessed                      Exercises Total Joint Exercises Ankle Circles/Pumps: AROM;Both;10 reps;Supine Quad Sets: AROM;Both;10 reps;Supine Heel Slides: AAROM;Left;10 reps;Supine Hip ABduction/ADduction: AAROM;Left;10 reps;Supine Straight Leg Raises: AAROM;Left;10 reps;Supine Goniometric ROM: 10-45 degrees    General Comments        Pertinent Vitals/Pain 5/10 L knee. Ice applied end of session    Home Living                      Prior Function            PT Goals (current goals can now be found in the care plan section) Progress towards PT goals: Progressing toward goals    Frequency  7X/week    PT Plan Current plan remains appropriate    Co-evaluation             End of Session Equipment Utilized During Treatment: Gait belt Activity Tolerance: Patient limited by fatigue;Patient limited by pain Patient left: in bed;with call bell/phone within reach     Time: 4132-4401 PT Time Calculation (min): 24 min  Charges:  $Gait Training: 8-22 mins $Therapeutic Exercise: 8-22 mins                    G Codes:      Weston Anna, MPT Pager: [phone]

## 2014-03-22 NOTE — Progress Notes (Signed)
OT Cancellation Note  Patient Details Name: [name] [name] MRN: [id number] DOB: Mar 31, 1927   Cancelled Treatment:    Reason Eval/Treat Not Completed: Other (comment)   Pt is Medicare/Medicaid and current D/C plan is SNF. No apparent immediate acute care OT needs, therefore will defer OT to SNF. If OT eval is needed please call Acute Rehab Dept. at [facility] 03/22/2014, 8:18 AM Lesle Chris, OTR/L [phone] 03/22/2014

## 2014-03-22 NOTE — Progress Notes (Signed)
   Subjective: 1 Day Post-Op Procedure(s) (LRB): LEFT TOTAL KNEE ARTHROPLASTY (Left) Patient reports pain as moderate last night but better this morning. Patient seen in rounds with Dr. Wynelle Link. Patient is well, but has had some minor complaints of pain in the knee, requiring pain medications. We will start therapy today. Already doing SLR's this morning. Plan is to go Skilled nursing facility after hospital stay.  Objective: Vital signs in last 24 hours: Temp:  [97.4 F (36.3 C)-98.2 F (36.8 C)] 97.5 F (36.4 C) (05/19 0626) Pulse Rate:  [56-77] 56 (05/19 0626) Resp:  [12-20] 18 (05/19 0626) BP: (101-155)/(40-75) 155/72 mmHg (05/19 0626) SpO2:  [96 %-100 %] 100 % (05/19 0626) Weight:  [73.029 kg (161 lb)] 73.029 kg (161 lb) (05/18 1410)  Intake/Output from previous day:  Intake/Output Summary (Last 24 hours) at 03/22/14 0857 Last data filed at 03/22/14 0700  Gross per 24 hour  Intake 4026.25 ml  Output   2685 ml  Net 1341.25 ml    Labs:  Recent Labs  03/22/14 0422  HGB 9.8*    Recent Labs  03/22/14 0422  WBC 15.6*  RBC 3.16*  HCT 29.6*  PLT 233    Recent Labs  03/22/14 0422  NA 134*  K 4.8  CL 98  CO2 28  BUN 18  CREATININE 0.77  GLUCOSE 146*  CALCIUM 8.4   No results found for this basename: LABPT, INR,  in the last 72 hours  EXAM General - Patient is Alert, Appropriate and Oriented Extremity - Neurovascular intact Sensation intact distally Dorsiflexion/Plantar flexion intact Doing SLR's on exam Dressing - dressing C/D/I Motor Function - intact, moving foot and toes well on exam.  Hemovac pulled without difficulty.  Past Medical History  Diagnosis Date  . Chronic obstructive asthma, unspecified   . Allergic rhinitis, cause unspecified   . Angioneurotic edema not elsewhere classified   . Cancer 2006    lung ca  . Malignant neoplasm of bronchus and lung, unspecified site   . Environmental allergies   . Headache(784.0)   . Depression     . Arthritis   . Collapsed lung 1959    hx of with chest tube insertion  . Complication of anesthesia     severe headache x1  . UTI (urinary tract infection)     Assessment/Plan: 1 Day Post-Op Procedure(s) (LRB): LEFT TOTAL KNEE ARTHROPLASTY (Left) Principal Problem:   OA (osteoarthritis) of knee Active Problems:   Postoperative anemia due to acute blood loss   Hyponatremia  Estimated body mass index is 30.44 kg/(m^2) as calculated from the following:   Height as of this encounter: 5\' 1"  (1.549 m).   Weight as of this encounter: 73.029 kg (161 lb). Advance diet Up with therapy Discharge to SNF - probably Thursday  DVT Prophylaxis - Xarelto Weight-Bearing as tolerated to left leg D/C O2 and Pulse OX and try on Room Air  Arlee Muslim, PA-C Orthopaedic Surgery 03/22/2014, 8:57 AM

## 2014-03-23 LAB — BASIC METABOLIC PANEL
BUN: 16 mg/dL (ref 6–23)
CO2: 29 meq/L (ref 19–32)
Calcium: 8.6 mg/dL (ref 8.4–10.5)
Chloride: 101 mEq/L (ref 96–112)
Creatinine, Ser: 0.74 mg/dL (ref 0.50–1.10)
GFR calc Af Amer: 87 mL/min — ABNORMAL LOW (ref >90)
GFR, EST NON AFRICAN AMERICAN: 75 mL/min — AB (ref >90)
GLUCOSE: 128 mg/dL — AB (ref 70–99)
Potassium: 4.5 mEq/L (ref 3.7–5.3)
SODIUM: 139 meq/L (ref 137–147)

## 2014-03-23 LAB — CBC
HCT: 27.1 % — ABNORMAL LOW (ref 36.0–46.0)
HEMOGLOBIN: 9 g/dL — AB (ref 12.0–15.0)
MCH: 31.3 pg (ref 26.0–34.0)
MCHC: 33.2 g/dL (ref 30.0–36.0)
MCV: 94.1 fL (ref 78.0–100.0)
PLATELETS: 233 10*3/uL (ref 150–400)
RBC: 2.88 MIL/uL — AB (ref 3.87–5.11)
RDW: 13.3 % (ref 11.5–15.5)
WBC: 14.1 10*3/uL — ABNORMAL HIGH (ref 4.0–10.5)

## 2014-03-23 MED ORDER — POLYSACCHARIDE IRON COMPLEX 150 MG PO CAPS
150.0000 mg | ORAL_CAPSULE | Freq: Every day | ORAL | Status: DC
  Administered 2014-03-23 – 2014-03-24 (×2): 150 mg via ORAL
  Filled 2014-03-23 (×2): qty 1

## 2014-03-23 NOTE — Progress Notes (Signed)
   Subjective: 2 Days Post-Op Procedure(s) (LRB): LEFT TOTAL KNEE ARTHROPLASTY (Left) Patient reports pain as mild.   Patient seen in rounds for Dr. Wynelle Link. Patient is well, but has had some minor complaints of pain in the knee, requiring pain medications Plan is to go Skilled nursing facility after hospital stay.  Objective: Vital signs in last 24 hours: Temp:  [97.7 F (36.5 C)-98.1 F (36.7 C)] 97.7 F (36.5 C) (05/20 0930) Pulse Rate:  [62-75] 75 (05/20 0930) Resp:  [16] 16 (05/20 0609) BP: (120-153)/(57-79) 133/70 mmHg (05/20 0930) SpO2:  [93 %-99 %] 93 % (05/20 0930)  Intake/Output from previous day:  Intake/Output Summary (Last 24 hours) at 03/23/14 1046 Last data filed at 03/23/14 0929  Gross per 24 hour  Intake    840 ml  Output   2525 ml  Net  -1685 ml    Intake/Output this shift: Total I/O In: 240 [P.O.:240] Out: -   Labs:  Recent Labs  03/22/14 0422 03/23/14 0419  HGB 9.8* 9.0*    Recent Labs  03/22/14 0422 03/23/14 0419  WBC 15.6* 14.1*  RBC 3.16* 2.88*  HCT 29.6* 27.1*  PLT 233 233    Recent Labs  03/22/14 0422 03/23/14 0419  NA 134* 139  K 4.8 4.5  CL 98 101  CO2 28 29  BUN 18 16  CREATININE 0.77 0.74  GLUCOSE 146* 128*  CALCIUM 8.4 8.6   No results found for this basename: LABPT, INR,  in the last 72 hours  EXAM General - Patient is Alert and Appropriate Extremity - Neurovascular intact Sensation intact distally Dressing/Incision - clean, dry, no drainage, healing Motor Function - intact, moving foot and toes well on exam.   Past Medical History  Diagnosis Date  . Chronic obstructive asthma, unspecified   . Allergic rhinitis, cause unspecified   . Angioneurotic edema not elsewhere classified   . Cancer 2006    lung ca  . Malignant neoplasm of bronchus and lung, unspecified site   . Environmental allergies   . Headache(784.0)   . Depression   . Arthritis   . Collapsed lung 1959    hx of with chest tube insertion    . Complication of anesthesia     severe headache x1  . UTI (urinary tract infection)     Assessment/Plan: 2 Days Post-Op Procedure(s) (LRB): LEFT TOTAL KNEE ARTHROPLASTY (Left) Principal Problem:   OA (osteoarthritis) of knee Active Problems:   Postoperative anemia due to acute blood loss   Hyponatremia  Estimated body mass index is 30.44 kg/(m^2) as calculated from the following:   Height as of this encounter: 5\' 1"  (1.549 m).   Weight as of this encounter: 73.029 kg (161 lb). Up with therapy Plan for discharge tomorrow Discharge to SNF - probably Thursday  DVT Prophylaxis - Xarelto Weight-Bearing as tolerated to left leg  Arlee Muslim, PA-C Orthopaedic Surgery 03/23/2014, 10:47 AM

## 2014-03-23 NOTE — Progress Notes (Signed)
PT Cancellation Note  Patient Details Name: [name] [name] MRN: [id number] DOB: [birth year]   Cancelled Treatment:     Attempted tx session. Pt requested PT check back in an hour or so-reports increased pain at this time.Will check back later today. Thanks.    Weston Anna, MPT Pager: [phone]

## 2014-03-23 NOTE — Discharge Instructions (Addendum)
Dr. Gaynelle Arabian Total Joint Specialist [facility] [address] [place], [place] [address] [phone]  TOTAL KNEE REPLACEMENT POSTOPERATIVE DIRECTIONS    Knee Rehabilitation, Guidelines Following Surgery  Results after knee surgery are often greatly improved when you follow the exercise, range of motion and muscle strengthening exercises prescribed by your doctor. Safety measures are also important to protect the knee from further injury. Any time any of these exercises cause you to have increased pain or swelling in your knee joint, decrease the amount until you are comfortable again and slowly increase them. If you have problems or questions, call your caregiver or physical therapist for advice.   HOME CARE INSTRUCTIONS  Remove items at home which could result in a fall. This includes throw rugs or furniture in walking pathways.  Continue medications as instructed at time of discharge. You may have some home medications which will be placed on hold until you complete the course of blood thinner medication.  You may start showering once you are discharged home but do not submerge the incision under water. Just pat the incision dry and apply a dry gauze dressing on daily. Walk with walker as instructed.  You may resume a sexual relationship in one month or when given the OK by  your doctor.   Use walker as long as suggested by your caregivers.  Avoid periods of inactivity such as sitting longer than an hour when not asleep. This helps prevent blood clots.  You may put full weight on your legs and walk as much as is comfortable.  You may return to work once you are cleared by your doctor.  Do not drive a car for 6 weeks or until released by you surgeon.   Do not drive while taking narcotics.  Wear the elastic stockings for three weeks following surgery during the day but you may remove then at night. Make sure you keep all of your appointments after your  operation with all of your doctors and caregivers. You should call the office at the above phone number and make an appointment for approximately two weeks after the date of your surgery. Change the dressing daily and reapply a dry dressing each time. Please pick up a stool softener and laxative for home use as long as you are requiring pain medications.  Continue to use ice on the knee for pain and swelling from surgery. You may notice swelling that will progress down to the foot and ankle.  This is normal after surgery.  Elevate the leg when you are not up walking on it.   It is important for you to complete the blood thinner medication as prescribed by your doctor.  Continue to use the breathing machine which will help keep your temperature down.  It is common for your temperature to cycle up and down following surgery, especially at night when you are not up moving around and exerting yourself.  The breathing machine keeps your lungs expanded and your temperature down.  RANGE OF MOTION AND STRENGTHENING EXERCISES  Rehabilitation of the knee is important following a knee injury or an operation. After just a few days of immobilization, the muscles of the thigh which control the knee become weakened and shrink (atrophy). Knee exercises are designed to build up the tone and strength of the thigh muscles and to improve knee motion. Often times heat used for twenty to thirty minutes before working out will loosen up your tissues and help with improving the  range of motion but do not use heat for the first two weeks following surgery. These exercises can be done on a training (exercise) mat, on the floor, on a table or on a bed. Use what ever works the best and is most comfortable for you Knee exercises include:  Leg Lifts - While your knee is still immobilized in a splint or cast, you can do straight leg raises. Lift the leg to 60 degrees, hold for 3 sec, and slowly lower the leg. Repeat 10-20 times 2-3  times daily. Perform this exercise against resistance later as your knee gets better.  Quad and Hamstring Sets - Tighten up the muscle on the front of the thigh (Quad) and hold for 5-10 sec. Repeat this 10-20 times hourly. Hamstring sets are done by pushing the foot backward against an object and holding for 5-10 sec. Repeat as with quad sets.  A rehabilitation program following serious knee injuries can speed recovery and prevent re-injury in the future due to weakened muscles. Contact your doctor or a physical therapist for more information on knee rehabilitation.   SKILLED REHAB INSTRUCTIONS: If the patient is transferred to a skilled rehab facility following release from the hospital, a list of the current medications will be sent to the facility for the patient to continue.  When discharged from the skilled rehab facility, please have the facility set up the patient's [place] prior to being released. Also, the skilled facility will be responsible for providing the patient with their medications at time of release from the facility to include their pain medication, the muscle relaxants, and their blood thinner medication. If the patient is still at the rehab facility at time of the two week follow up appointment, the skilled rehab facility will also need to assist the patient in arranging follow up appointment in our office and any transportation needs.  MAKE SURE YOU:  Understand these instructions.  Will watch your condition.  Will get help right away if you are not doing well or get worse.    Pick up stool softner and laxative for home. Do not submerge incision under water. May shower. Continue to use ice for pain and swelling from surgery.  Take Xarelto for two and a half more weeks, then discontinue Xarelto. Once the patient has completed the blood thinner regimen, then take a Baby 81 mg Aspirin daily for three more weeks.  When discharged from the skilled rehab  facility, please have the facility set up the patient's [place] prior to being released.  Also provide the patient with their medications at time of release from the facility to include their pain medication, the muscle relaxants, and their blood thinner medication.  If the patient is still at the rehab facility at time of follow up appointment, please also assist the patient in arranging follow up appointment in our office and any transportation needs.      Information on my medicine - XARELTO (Rivaroxaban)  This medication education was reviewed with me or my healthcare representative as part of my discharge preparation.  The pharmacist that spoke with me during my hospital stay was:  Julio Sicks, [facility]  Why was Xarelto prescribed for you? Xarelto was prescribed for you to reduce the risk of blood clots forming after orthopedic surgery. The medical term for these abnormal blood clots is venous thromboembolism (VTE).  What do you need to know about xarelto ? Take your Xarelto ONCE DAILY at the same  time every day. You may take it either with or without food.  If you have difficulty swallowing the tablet whole, you may crush it and mix in applesauce just prior to taking your dose.  Take Xarelto exactly as prescribed by your doctor and DO NOT stop taking Xarelto without talking to the doctor who prescribed the medication.  Stopping without other VTE prevention medication to take the place of Xarelto may increase your risk of developing a clot.  After discharge, you should have regular check-up appointments with your healthcare provider that is prescribing your Xarelto.    What do you do if you miss a dose? If you miss a dose, take it as soon as you remember on the same day then continue your regularly scheduled once daily regimen the next day. Do not take two doses of Xarelto on the same day.   Important Safety Information A possible side effect of Xarelto is  bleeding. You should call your healthcare provider right away if you experience any of the following:   Bleeding from an injury or your nose that does not stop.   Unusual colored urine (red or dark brown) or unusual colored stools (red or black).   Unusual bruising for unknown reasons.   A serious fall or if you hit your head (even if there is no bleeding).  Some medicines may interact with Xarelto and might increase your risk of bleeding while on Xarelto. To help avoid this, consult your healthcare provider or pharmacist prior to using any new prescription or non-prescription medications, including herbals, vitamins, non-steroidal anti-inflammatory drugs (NSAIDs) and supplements.  This website has more information on Xarelto: [email or web address]

## 2014-03-23 NOTE — Progress Notes (Signed)
Physical Therapy Treatment Patient Details Name: [name] [name] MRN: [id number] DOB: [birth year] Today's Date: 03/23/2014    History of Present Illness 80 yo female s/p L TKA. Hx of lung cancer, asthma    PT Comments    Progressing slowly with mobility-limited by pain today. Ambulated a short distance and performed a couple of ROM exercises. Pt agreeable to try to get into [facility] this afternoon.   Follow Up Recommendations  SNF     Equipment Recommendations  None recommended by PT    Recommendations for Other Services       Precautions / Restrictions Precautions Precautions: Knee;Fall Required Braces or Orthoses: Knee Immobilizer - Left Knee Immobilizer - Left: Discontinue once straight leg raise with < 10 degree lag Restrictions Weight Bearing Restrictions: No LLE Weight Bearing: Weight bearing as tolerated    Mobility  Bed Mobility Overal bed mobility: Needs Assistance Bed Mobility: Supine to Sit;Sit to Supine     Supine to sit: Min assist Sit to supine: Min assist   General bed mobility comments: Assist for R LE.   Transfers   Equipment used: Rolling walker (2 wheeled) Transfers: Sit to/from Stand Sit to Stand: From elevated surface;Min assist         General transfer comment: assist to rise, stabilize,control descent. VCs safety, technique, hand placement.   Ambulation/Gait   Ambulation Distance (Feet): 20 Feet Assistive device: Rolling walker (2 wheeled) Gait Pattern/deviations: Step-to pattern;Antalgic;Trunk flexed;Decreased stance time - left;Decreased weight shift to left;Decreased stride length;Decreased step length - right;Decreased step length - left     General Gait Details: VCs safety, technique, sequence, technique. Assist to stabilize/support pt throughout. Fatigues quickly. Distance limited by pain.    Stairs            Wheelchair Mobility    Modified Rankin (Stroke Patients Only)       Balance                                     Cognition Arousal/Alertness: Awake/alert Behavior During Therapy: WFL for tasks assessed/performed Overall Cognitive Status: Within Functional Limits for tasks assessed                      Exercises Total Joint Exercises Ankle Circles/Pumps: AROM;Both;10 reps;Supine Straight Leg Raises: AAROM;Left;10 reps;Supine    General Comments        Pertinent Vitals/Pain 4/10 at rest; 6/10 with activity. Ice applied end of session    Home Living                      Prior Function            PT Goals (current goals can now be found in the care plan section) Progress towards PT goals: Progressing toward goals (slowly)    Frequency  7X/week    PT Plan Current plan remains appropriate    Co-evaluation             End of Session Equipment Utilized During Treatment: Gait belt Activity Tolerance: Patient limited by pain Patient left: in bed;with call bell/phone within reach     Time: 1350-1409 PT Time Calculation (min): 19 min  Charges:  $Gait Training: 8-22 mins                    G Codes:      Weston Anna, MPT Pager: [phone]

## 2014-03-24 ENCOUNTER — Other Ambulatory Visit: Payer: Self-pay | Admitting: *Deleted

## 2014-03-24 DIAGNOSIS — Z0389 Encounter for observation for other suspected diseases and conditions ruled out: Secondary | ICD-10-CM | POA: Diagnosis not present

## 2014-03-24 DIAGNOSIS — Z9889 Other specified postprocedural states: Secondary | ICD-10-CM | POA: Diagnosis not present

## 2014-03-24 DIAGNOSIS — F329 Major depressive disorder, single episode, unspecified: Secondary | ICD-10-CM | POA: Diagnosis not present

## 2014-03-24 DIAGNOSIS — S72009A Fracture of unspecified part of neck of unspecified femur, initial encounter for closed fracture: Secondary | ICD-10-CM | POA: Diagnosis present

## 2014-03-24 DIAGNOSIS — J42 Unspecified chronic bronchitis: Secondary | ICD-10-CM | POA: Diagnosis not present

## 2014-03-24 DIAGNOSIS — S298XXA Other specified injuries of thorax, initial encounter: Secondary | ICD-10-CM | POA: Diagnosis not present

## 2014-03-24 DIAGNOSIS — Z87440 Personal history of urinary (tract) infections: Secondary | ICD-10-CM | POA: Diagnosis not present

## 2014-03-24 DIAGNOSIS — S8990XA Unspecified injury of unspecified lower leg, initial encounter: Secondary | ICD-10-CM | POA: Diagnosis not present

## 2014-03-24 DIAGNOSIS — T148XXA Other injury of unspecified body region, initial encounter: Secondary | ICD-10-CM | POA: Diagnosis not present

## 2014-03-24 DIAGNOSIS — Z886 Allergy status to analgesic agent status: Secondary | ICD-10-CM | POA: Diagnosis not present

## 2014-03-24 DIAGNOSIS — Z5189 Encounter for other specified aftercare: Secondary | ICD-10-CM | POA: Diagnosis not present

## 2014-03-24 DIAGNOSIS — M25569 Pain in unspecified knee: Secondary | ICD-10-CM | POA: Diagnosis not present

## 2014-03-24 DIAGNOSIS — R9431 Abnormal electrocardiogram [ECG] [EKG]: Secondary | ICD-10-CM | POA: Diagnosis not present

## 2014-03-24 DIAGNOSIS — K5900 Constipation, unspecified: Secondary | ICD-10-CM | POA: Diagnosis not present

## 2014-03-24 DIAGNOSIS — Z85118 Personal history of other malignant neoplasm of bronchus and lung: Secondary | ICD-10-CM | POA: Diagnosis not present

## 2014-03-24 DIAGNOSIS — F411 Generalized anxiety disorder: Secondary | ICD-10-CM | POA: Diagnosis not present

## 2014-03-24 DIAGNOSIS — I10 Essential (primary) hypertension: Secondary | ICD-10-CM | POA: Diagnosis not present

## 2014-03-24 DIAGNOSIS — Z87891 Personal history of nicotine dependence: Secondary | ICD-10-CM | POA: Diagnosis not present

## 2014-03-24 DIAGNOSIS — Z885 Allergy status to narcotic agent status: Secondary | ICD-10-CM | POA: Diagnosis not present

## 2014-03-24 DIAGNOSIS — S72023A Displaced fracture of epiphysis (separation) (upper) of unspecified femur, initial encounter for closed fracture: Secondary | ICD-10-CM | POA: Diagnosis not present

## 2014-03-24 DIAGNOSIS — S0990XA Unspecified injury of head, initial encounter: Secondary | ICD-10-CM | POA: Diagnosis not present

## 2014-03-24 DIAGNOSIS — M129 Arthropathy, unspecified: Secondary | ICD-10-CM | POA: Diagnosis not present

## 2014-03-24 DIAGNOSIS — Z880 Allergy status to penicillin: Secondary | ICD-10-CM | POA: Diagnosis not present

## 2014-03-24 DIAGNOSIS — Z825 Family history of asthma and other chronic lower respiratory diseases: Secondary | ICD-10-CM | POA: Diagnosis not present

## 2014-03-24 DIAGNOSIS — Z79899 Other long term (current) drug therapy: Secondary | ICD-10-CM | POA: Diagnosis not present

## 2014-03-24 DIAGNOSIS — M6281 Muscle weakness (generalized): Secondary | ICD-10-CM | POA: Diagnosis not present

## 2014-03-24 DIAGNOSIS — Z881 Allergy status to other antibiotic agents status: Secondary | ICD-10-CM | POA: Diagnosis not present

## 2014-03-24 DIAGNOSIS — J4489 Other specified chronic obstructive pulmonary disease: Secondary | ICD-10-CM | POA: Diagnosis present

## 2014-03-24 DIAGNOSIS — M1710 Unilateral primary osteoarthritis, unspecified knee: Secondary | ICD-10-CM | POA: Diagnosis not present

## 2014-03-24 DIAGNOSIS — F3289 Other specified depressive episodes: Secondary | ICD-10-CM | POA: Diagnosis present

## 2014-03-24 DIAGNOSIS — Z96659 Presence of unspecified artificial knee joint: Secondary | ICD-10-CM | POA: Diagnosis not present

## 2014-03-24 DIAGNOSIS — S32509A Unspecified fracture of unspecified pubis, initial encounter for closed fracture: Secondary | ICD-10-CM | POA: Diagnosis not present

## 2014-03-24 DIAGNOSIS — IMO0001 Reserved for inherently not codable concepts without codable children: Secondary | ICD-10-CM | POA: Diagnosis not present

## 2014-03-24 DIAGNOSIS — X58XXXA Exposure to other specified factors, initial encounter: Secondary | ICD-10-CM | POA: Diagnosis not present

## 2014-03-24 DIAGNOSIS — IMO0002 Reserved for concepts with insufficient information to code with codable children: Secondary | ICD-10-CM | POA: Diagnosis not present

## 2014-03-24 DIAGNOSIS — E871 Hypo-osmolality and hyponatremia: Secondary | ICD-10-CM | POA: Diagnosis not present

## 2014-03-24 DIAGNOSIS — D62 Acute posthemorrhagic anemia: Secondary | ICD-10-CM | POA: Diagnosis not present

## 2014-03-24 DIAGNOSIS — J301 Allergic rhinitis due to pollen: Secondary | ICD-10-CM | POA: Diagnosis not present

## 2014-03-24 DIAGNOSIS — Z471 Aftercare following joint replacement surgery: Secondary | ICD-10-CM | POA: Diagnosis not present

## 2014-03-24 DIAGNOSIS — M25559 Pain in unspecified hip: Secondary | ICD-10-CM | POA: Diagnosis present

## 2014-03-24 DIAGNOSIS — S99919A Unspecified injury of unspecified ankle, initial encounter: Secondary | ICD-10-CM | POA: Diagnosis not present

## 2014-03-24 DIAGNOSIS — M1990 Unspecified osteoarthritis, unspecified site: Secondary | ICD-10-CM | POA: Diagnosis not present

## 2014-03-24 DIAGNOSIS — Z820 Family history of epilepsy and other diseases of the nervous system: Secondary | ICD-10-CM | POA: Diagnosis not present

## 2014-03-24 DIAGNOSIS — S79919A Unspecified injury of unspecified hip, initial encounter: Secondary | ICD-10-CM | POA: Diagnosis not present

## 2014-03-24 DIAGNOSIS — J449 Chronic obstructive pulmonary disease, unspecified: Secondary | ICD-10-CM | POA: Diagnosis not present

## 2014-03-24 DIAGNOSIS — Z7982 Long term (current) use of aspirin: Secondary | ICD-10-CM | POA: Diagnosis not present

## 2014-03-24 DIAGNOSIS — F0390 Unspecified dementia without behavioral disturbance: Secondary | ICD-10-CM | POA: Diagnosis not present

## 2014-03-24 DIAGNOSIS — R51 Headache: Secondary | ICD-10-CM | POA: Diagnosis not present

## 2014-03-24 DIAGNOSIS — Z823 Family history of stroke: Secondary | ICD-10-CM | POA: Diagnosis not present

## 2014-03-24 LAB — CBC
HCT: 25.8 % — ABNORMAL LOW (ref 36.0–46.0)
Hemoglobin: 8.8 g/dL — ABNORMAL LOW (ref 12.0–15.0)
MCH: 32.2 pg (ref 26.0–34.0)
MCHC: 34.1 g/dL (ref 30.0–36.0)
MCV: 94.5 fL (ref 78.0–100.0)
PLATELETS: 227 10*3/uL (ref 150–400)
RBC: 2.73 MIL/uL — AB (ref 3.87–5.11)
RDW: 13.3 % (ref 11.5–15.5)
WBC: 9.5 10*3/uL (ref 4.0–10.5)

## 2014-03-24 MED ORDER — METHOCARBAMOL 500 MG PO TABS: 500.0000 mg | ORAL_TABLET | Freq: Four times a day (QID) | ORAL | Status: DC | PRN

## 2014-03-24 MED ORDER — OXYCODONE HCL 5 MG PO TABS: 5.0000 mg | ORAL_TABLET | ORAL | Status: DC | PRN

## 2014-03-24 MED ORDER — DIAZEPAM 5 MG PO TABS: ORAL_TABLET | ORAL | Status: DC

## 2014-03-24 MED ORDER — RIVAROXABAN 10 MG PO TABS: 10.0000 mg | ORAL_TABLET | Freq: Every day | ORAL | Status: DC

## 2014-03-24 MED ORDER — POLYETHYLENE GLYCOL 3350 17 G PO PACK: 17.0000 g | PACK | Freq: Every day | ORAL | Status: DC | PRN

## 2014-03-24 MED ORDER — DIAZEPAM 5 MG PO TABS: 2.5000 mg | ORAL_TABLET | Freq: Every evening | ORAL | Status: DC | PRN

## 2014-03-24 MED ORDER — METOCLOPRAMIDE HCL 5 MG PO TABS: 5.0000 mg | ORAL_TABLET | Freq: Three times a day (TID) | ORAL | Status: DC | PRN

## 2014-03-24 MED ORDER — DSS 100 MG PO CAPS: 100.0000 mg | ORAL_CAPSULE | Freq: Two times a day (BID) | ORAL | Status: DC

## 2014-03-24 MED ORDER — POLYSACCHARIDE IRON COMPLEX 150 MG PO CAPS: 150.0000 mg | ORAL_CAPSULE | Freq: Every day | ORAL | Status: DC

## 2014-03-24 MED ORDER — BISACODYL 10 MG RE SUPP: 10.0000 mg | Freq: Every day | RECTAL | Status: DC | PRN

## 2014-03-24 MED ORDER — ONDANSETRON HCL 4 MG PO TABS: 4.0000 mg | ORAL_TABLET | Freq: Four times a day (QID) | ORAL | Status: DC | PRN

## 2014-03-24 NOTE — Telephone Encounter (Signed)
[place]

## 2014-03-24 NOTE — Progress Notes (Signed)
   Subjective: 3 Days Post-Op Procedure(s) (LRB): LEFT TOTAL KNEE ARTHROPLASTY (Left) Patient reports pain as mild.   Patient seen in rounds with Dr. Wynelle Link.  She is doing better today. Patient is well, and has had no acute complaints or problems Patient is ready to go to the SNF - [facility].  Objective: Vital signs in last 24 hours: Temp:  [97.7 F (36.5 C)-98.8 F (37.1 C)] 97.8 F (36.6 C) (05/21 0556) Pulse Rate:  [69-75] 74 (05/21 0556) Resp:  [16] 16 (05/21 0556) BP: (123-154)/(56-80) 151/79 mmHg (05/21 0556) SpO2:  [93 %-100 %] 100 % (05/21 0556)  Intake/Output from previous day:  Intake/Output Summary (Last 24 hours) at 03/24/14 0758 Last data filed at 03/24/14 0500  Gross per 24 hour  Intake    720 ml  Output   1250 ml  Net   -530 ml    Intake/Output this shift:    Labs:  Recent Labs  03/22/14 0422 03/23/14 0419 03/24/14 0456  HGB 9.8* 9.0* 8.8*    Recent Labs  03/23/14 0419 03/24/14 0456  WBC 14.1* 9.5  RBC 2.88* 2.73*  HCT 27.1* 25.8*  PLT 233 227    Recent Labs  03/22/14 0422 03/23/14 0419  NA 134* 139  K 4.8 4.5  CL 98 101  CO2 28 29  BUN 18 16  CREATININE 0.77 0.74  GLUCOSE 146* 128*  CALCIUM 8.4 8.6   No results found for this basename: LABPT, INR,  in the last 72 hours  EXAM: General - Patient is Alert, Appropriate and Oriented Extremity - Neurovascular intact Sensation intact distally Dorsiflexion/Plantar flexion intact Incision - clean, dry, no drainage, healing Motor Function - intact, moving foot and toes well on exam.   Assessment/Plan: 3 Days Post-Op Procedure(s) (LRB): LEFT TOTAL KNEE ARTHROPLASTY (Left) Procedure(s) (LRB): LEFT TOTAL KNEE ARTHROPLASTY (Left) Past Medical History  Diagnosis Date  . Chronic obstructive asthma, unspecified   . Allergic rhinitis, cause unspecified   . Angioneurotic edema not elsewhere classified   . Cancer 2006    lung ca  . Malignant neoplasm of bronchus and lung, unspecified  site   . Environmental allergies   . Headache(784.0)   . Depression   . Arthritis   . Collapsed lung 1959    hx of with chest tube insertion  . Complication of anesthesia     severe headache x1  . UTI (urinary tract infection)    Principal Problem:   OA (osteoarthritis) of knee Active Problems:   Postoperative anemia due to acute blood loss   Hyponatremia  Estimated body mass index is 30.44 kg/(m^2) as calculated from the following:   Height as of this encounter: 5\' 1"  (1.549 m).   Weight as of this encounter: 73.029 kg (161 lb). Up with therapy Discharge to SNF Diet - Regular diet Follow up - in 2 weeks Activity - WBAT Disposition - Skilled nursing facility - [facility] Condition Upon Discharge - Good D/C Meds - See DC Summary DVT Prophylaxis - [place], PA-C Orthopaedic Surgery 03/24/2014, 7:58 AM

## 2014-03-24 NOTE — Progress Notes (Signed)
Physical Therapy Treatment Patient Details Name: JAMIAYA BINA MRN: [id number] DOB: [birth year] Today's Date: 03/24/2014    History of Present Illness 80 yo female s/p L TKA. Hx of lung cancer, asthma    PT Comments    Pt continues to c/o significant L knee pain -rated 9/10 with activity on today. Limited ambulation distance and ROM this session. Dyspnea 3/4 with minimal ambulation distance. Replaced [place] O2 once seated in recliner. Plan is for d/c to SNF later today.   Follow Up Recommendations  SNF     Equipment Recommendations  None recommended by PT    Recommendations for Other Services       Precautions / Restrictions Precautions Precautions: Knee;Fall Required Braces or Orthoses: Knee Immobilizer - Left Knee Immobilizer - Left: Discontinue once straight leg raise with < 10 degree lag Restrictions Weight Bearing Restrictions: No LLE Weight Bearing: Weight bearing as tolerated    Mobility  Bed Mobility               General bed mobility comments: pt found sitting on commode in bathroom  Transfers Overall transfer level: Needs assistance Equipment used: Rolling walker (2 wheeled) Transfers: Sit to/from Stand Sit to Stand: Mod assist Stand pivot transfers: Min assist       General transfer comment: assist to rise, stabilize,control descent. VCs safety, technique, hand placement. Increased assistance this session. Pt reports she had been sitting on toilet " for awhile".   Ambulation/Gait Ambulation/Gait assistance: Min assist Ambulation Distance (Feet): 15 Feet Assistive device: Rolling walker (2 wheeled) Gait Pattern/deviations: Trunk flexed;Decreased stride length;Antalgic;Step-to pattern;Decreased step length - right;Decreased step length - left;Decreased stance time - left;Decreased weight shift to left     General Gait Details: Increased assistance needed this session. slow gait speed-increased time to ambulate short distance. Pt barely made it to  recliner-limited by pain. Multimodal cues for safety, technique, sequence, use of UEs to aid in [place] on L LE.    Stairs            Wheelchair Mobility    Modified Rankin (Stroke Patients Only)       Balance                                    Cognition Arousal/Alertness: Awake/alert Behavior During Therapy: Anxious Overall Cognitive Status: Within Functional Limits for tasks assessed                      Exercises Total Joint Exercises Ankle Circles/Pumps: Both;10 reps;Seated Heel Slides: AAROM;Left;5 reps;Seated (very limited ROM due to pain) Hip ABduction/ADduction: AAROM;Left;10 reps;Seated Straight Leg Raises: AAROM;Left;10 reps;Seated Goniometric ROM: 10-25 degrees-significantly limited by pain    General Comments        Pertinent Vitals/Pain L knee 9/10 with activity. Ice applied end of session    Home Living                      Prior Function            PT Goals (current goals can now be found in the care plan section) Progress towards PT goals: Progressing toward goals (very slowly!)    Frequency  7X/week    PT Plan Current plan remains appropriate    Co-evaluation             End of Session   Activity Tolerance: Patient limited by pain;Patient limited  by fatigue Patient left: in chair;with call bell/phone within reach     Time: 1003-1026 PT Time Calculation (min): 23 min  Charges:  $Gait Training: 8-22 mins $Therapeutic Activity: 8-22 mins                    G Codes:      Weston Anna, MPT Pager: [phone]

## 2014-03-24 NOTE — Discharge Summary (Signed)
Physician Discharge Summary   Patient ID: ACCALIA [name] MRN: [id number] DOB/AGE: 1927-02-06 80 y.o.  Admit date: 03/21/2014 Discharge date: 03-24-2014  Primary Diagnosis:  Osteoarthritis Left knee(s)  Admission Diagnoses:  Past Medical History  Diagnosis Date  . Chronic obstructive asthma, unspecified   . Allergic rhinitis, cause unspecified   . Angioneurotic edema not elsewhere classified   . Cancer 2006    lung ca  . Malignant neoplasm of bronchus and lung, unspecified site   . Environmental allergies   . Headache(784.0)   . Depression   . Arthritis   . Collapsed lung 1959    hx of with chest tube insertion  . Complication of anesthesia     severe headache x1  . UTI (urinary tract infection)    Discharge Diagnoses:   Principal Problem:   OA (osteoarthritis) of knee Active Problems:   Postoperative anemia due to acute blood loss   Hyponatremia  Estimated body mass index is 30.44 kg/(m^2) as calculated from the following:   Height as of this encounter: '5\' 1"'  (1.549 m).   Weight as of this encounter: 73.029 kg (161 lb).  Procedure:  Procedure(s) (LRB): LEFT TOTAL KNEE ARTHROPLASTY (Left)   Consults: None  HPI: [name] [name] is a 80 y.o. year old female with end stage OA of her left knee with progressively worsening pain and dysfunction. She has constant pain, with activity and at rest and significant functional deficits with difficulties even with ADLs. She has had extensive non-op management including analgesics, injections of cortisone and viscosupplements, and home exercise program, but remains in significant pain with significant dysfunction. Radiographs show bone on bone arthritis medial and patellofemoral. She presents now for left Total Knee Arthroplasty.   Laboratory Data: Admission on 03/21/2014  Component Date Value Ref Range Status  . ABO/RH(D) 03/21/2014 O NEG   Final  . Antibody Screen 03/21/2014 NEG   Final  . Sample Expiration 03/21/2014  03/24/2014   Final  . ABO/RH(D) 03/21/2014 O NEG   Final  . WBC 03/22/2014 15.6* 4.0 - 10.5 K/uL Final  . RBC 03/22/2014 3.16* 3.87 - 5.11 MIL/uL Final  . Hemoglobin 03/22/2014 9.8* 12.0 - 15.0 g/dL Final  . HCT 03/22/2014 29.6* 36.0 - 46.0 % Final  . MCV 03/22/2014 93.7  78.0 - 100.0 fL Final  . MCH 03/22/2014 31.0  26.0 - 34.0 pg Final  . MCHC 03/22/2014 33.1  30.0 - 36.0 g/dL Final  . RDW 03/22/2014 13.0  11.5 - 15.5 % Final  . Platelets 03/22/2014 233  150 - 400 K/uL Final  . Sodium 03/22/2014 134* 137 - 147 mEq/L Final  . Potassium 03/22/2014 4.8  3.7 - 5.3 mEq/L Final  . Chloride 03/22/2014 98  96 - 112 mEq/L Final  . CO2 03/22/2014 28  19 - 32 mEq/L Final  . Glucose, Bld 03/22/2014 146* 70 - 99 mg/dL Final  . BUN 03/22/2014 18  6 - 23 mg/dL Final  . Creatinine, Ser 03/22/2014 0.77  0.50 - 1.10 mg/dL Final  . Calcium 03/22/2014 8.4  8.4 - 10.5 mg/dL Final  . GFR calc non Af Amer 03/22/2014 74* >90 mL/min Final  . GFR calc Af Amer 03/22/2014 86* >90 mL/min Final   Comment: (NOTE)                          The eGFR has been calculated using the CKD EPI equation.  This calculation has not been validated in all clinical situations.                          eGFR's persistently <90 mL/min signify possible Chronic Kidney                          Disease.  . WBC 03/23/2014 14.1* 4.0 - 10.5 K/uL Final  . RBC 03/23/2014 2.88* 3.87 - 5.11 MIL/uL Final  . Hemoglobin 03/23/2014 9.0* 12.0 - 15.0 g/dL Final  . HCT 03/23/2014 27.1* 36.0 - 46.0 % Final  . MCV 03/23/2014 94.1  78.0 - 100.0 fL Final  . MCH 03/23/2014 31.3  26.0 - 34.0 pg Final  . MCHC 03/23/2014 33.2  30.0 - 36.0 g/dL Final  . RDW 03/23/2014 13.3  11.5 - 15.5 % Final  . Platelets 03/23/2014 233  150 - 400 K/uL Final  . Sodium 03/23/2014 139  137 - 147 mEq/L Final  . Potassium 03/23/2014 4.5  3.7 - 5.3 mEq/L Final  . Chloride 03/23/2014 101  96 - 112 mEq/L Final  . CO2 03/23/2014 29  19 - 32 mEq/L Final   . Glucose, Bld 03/23/2014 128* 70 - 99 mg/dL Final  . BUN 03/23/2014 16  6 - 23 mg/dL Final  . Creatinine, Ser 03/23/2014 0.74  0.50 - 1.10 mg/dL Final  . Calcium 03/23/2014 8.6  8.4 - 10.5 mg/dL Final  . GFR calc non Af Amer 03/23/2014 75* >90 mL/min Final  . GFR calc Af Amer 03/23/2014 87* >90 mL/min Final   Comment: (NOTE)                          The eGFR has been calculated using the CKD EPI equation.                          This calculation has not been validated in all clinical situations.                          eGFR's persistently <90 mL/min signify possible Chronic Kidney                          Disease.  . WBC 03/24/2014 9.5  4.0 - 10.5 K/uL Final  . RBC 03/24/2014 2.73* 3.87 - 5.11 MIL/uL Final  . Hemoglobin 03/24/2014 8.8* 12.0 - 15.0 g/dL Final  . HCT 03/24/2014 25.8* 36.0 - 46.0 % Final  . MCV 03/24/2014 94.5  78.0 - 100.0 fL Final  . MCH 03/24/2014 32.2  26.0 - 34.0 pg Final  . MCHC 03/24/2014 34.1  30.0 - 36.0 g/dL Final  . RDW 03/24/2014 13.3  11.5 - 15.5 % Final  . Platelets 03/24/2014 227  150 - 400 K/uL Final  Hospital Outpatient Visit on 03/10/2014  Component Date Value Ref Range Status  . MRSA, PCR 03/10/2014 NEGATIVE  NEGATIVE Final  . Staphylococcus aureus 03/10/2014 NEGATIVE  NEGATIVE Final   Comment:                                 The Xpert SA Assay (FDA  approved for NASAL specimens                          in patients over 57 years of age),                          is one component of                          a comprehensive surveillance                          program.  Test performance has                          been validated by [employer] for patients greater                          than or equal to 87 year old.                          It is not intended                          to diagnose infection nor to                          guide or monitor treatment.  Marland Kitchen aPTT 03/10/2014 32  24 -  37 seconds Final  . WBC 03/10/2014 8.3  4.0 - 10.5 K/uL Final  . RBC 03/10/2014 4.22  3.87 - 5.11 MIL/uL Final  . Hemoglobin 03/10/2014 13.4  12.0 - 15.0 g/dL Final  . HCT 03/10/2014 39.6  36.0 - 46.0 % Final  . MCV 03/10/2014 93.8  78.0 - 100.0 fL Final  . MCH 03/10/2014 31.8  26.0 - 34.0 pg Final  . MCHC 03/10/2014 33.8  30.0 - 36.0 g/dL Final  . RDW 03/10/2014 12.9  11.5 - 15.5 % Final  . Platelets 03/10/2014 294  150 - 400 K/uL Final  . Sodium 03/10/2014 141  137 - 147 mEq/L Final  . Potassium 03/10/2014 5.1  3.7 - 5.3 mEq/L Final  . Chloride 03/10/2014 98  96 - 112 mEq/L Final  . CO2 03/10/2014 31  19 - 32 mEq/L Final  . Glucose, Bld 03/10/2014 104* 70 - 99 mg/dL Final  . BUN 03/10/2014 28* 6 - 23 mg/dL Final  . Creatinine, Ser 03/10/2014 0.90  0.50 - 1.10 mg/dL Final  . Calcium 03/10/2014 10.0  8.4 - 10.5 mg/dL Final  . Total Protein 03/10/2014 7.7  6.0 - 8.3 g/dL Final  . Albumin 03/10/2014 4.7  3.5 - 5.2 g/dL Final  . AST 03/10/2014 22  0 - 37 U/L Final  . ALT 03/10/2014 16  0 - 35 U/L Final  . Alkaline Phosphatase 03/10/2014 65  39 - 117 U/L Final  . Total Bilirubin 03/10/2014 0.7  0.3 - 1.2 mg/dL Final  . GFR calc non Af Amer 03/10/2014 57* >90 mL/min Final  . GFR calc Af Amer 03/10/2014 66* >90 mL/min Final   Comment: (NOTE)  The eGFR has been calculated using the CKD EPI equation.                          This calculation has not been validated in all clinical situations.                          eGFR's persistently <90 mL/min signify possible Chronic Kidney                          Disease.  Marland Kitchen Prothrombin Time 03/10/2014 12.6  11.6 - 15.2 seconds Final  . INR 03/10/2014 0.96  0.00 - 1.49 Final  . Color, Urine 03/10/2014 YELLOW  YELLOW Final  . APPearance 03/10/2014 CLEAR  CLEAR Final  . Specific Gravity, Urine 03/10/2014 1.015  1.005 - 1.030 Final  . pH 03/10/2014 6.0  5.0 - 8.0 Final  . Glucose, UA 03/10/2014 NEGATIVE  NEGATIVE mg/dL Final    . Hgb urine dipstick 03/10/2014 TRACE* NEGATIVE Final  . Bilirubin Urine 03/10/2014 NEGATIVE  NEGATIVE Final  . Ketones, ur 03/10/2014 NEGATIVE  NEGATIVE mg/dL Final  . Protein, ur 03/10/2014 NEGATIVE  NEGATIVE mg/dL Final  . Urobilinogen, UA 03/10/2014 0.2  0.0 - 1.0 mg/dL Final  . Nitrite 03/10/2014 NEGATIVE  NEGATIVE Final  . Leukocytes, UA 03/10/2014 MODERATE* NEGATIVE Final  . Squamous Epithelial / LPF 03/10/2014 RARE  RARE Final  . WBC, UA 03/10/2014 21-50  <3 WBC/hpf Final   WBC CLUMPS  . RBC / HPF 03/10/2014 3-6  <3 RBC/hpf Final  . Bacteria, UA 03/10/2014 RARE  RARE Final  . Urine-Other 03/10/2014 MUCOUS PRESENT   Final     X-Rays:Dg Chest 2 View  03/10/2014   CLINICAL DATA:  Preop total knee arthroplasty. History of lung carcinoma and asthma. Former smoker.  EXAM: CHEST  2 VIEW  COMPARISON:  09/08/2012  FINDINGS: Lungs are hyperexpanded. There is mild reticular scarring at the bases. A pulmonary anastomosis staple line extends from above the right hilum. Lungs are otherwise clear. No pleural effusion. No pneumothorax.  Cardiac silhouette is normal in size. Normal mediastinal and hilar contours.  Bony thorax is demineralized but intact.  IMPRESSION: No acute cardiopulmonary disease.   Electronically Signed   By: Lajean Manes M.D.   On: 03/10/2014 14:42    EKG:No orders found for this or any previous visit.   Hospital Course: KATHARINE ROCHEFORT is a 80 y.o. who was admitted to [facility]. They were brought to the operating room on 03/21/2014 and underwent Procedure(s): LEFT TOTAL KNEE ARTHROPLASTY.  Patient tolerated the procedure well and was later transferred to the recovery room and then to the orthopaedic floor for postoperative care.  They were given PO and IV analgesics for pain control following their surgery.  They were given 24 hours of postoperative antibiotics of  Anti-infectives   Start     Dose/Rate Route Frequency Ordered Stop   03/21/14 2300  vancomycin  (VANCOCIN) IVPB 1000 mg/200 mL premix     1,000 mg 200 mL/hr over 60 Minutes Intravenous Every 12 hours 03/21/14 1411 03/21/14 2334   03/21/14 0821  vancomycin (VANCOCIN) IVPB 1000 mg/200 mL premix     1,000 mg 200 mL/hr over 60 Minutes Intravenous On call to O.R. 03/21/14 3299 03/21/14 1209     and started on DVT prophylaxis in the form of Xarelto.   PT and OT were ordered  for total joint protocol.  Discharge planning consulted to help with postop disposition and equipment needs.  Patient had a rough night on the evening of surgery with pain but better the next morning.  Social worker got involved to assist with placement of the patient.  They started to get up OOB with therapy on day one. Hemovac drain was pulled without difficulty.  Continued to work with therapy into day two.  Dressing was changed on day two and the incision was healing well.  By day three, the patient had progressed with therapy and meeting their goals.  Incision was healing well.  Patient was seen in rounds and was ready to go to [facility].  Discharge to SNF  Diet - Regular diet  Follow up - in 2 weeks  Activity - WBAT  Disposition - Skilled nursing facility - [facility]  Condition Upon Discharge - Good  D/C Meds - See DC Summary  DVT Prophylaxis - Xarelto   Discharge Instructions   Call MD / Call 911    Complete by:  As directed   If you experience chest pain or shortness of breath, CALL 911 and be transported to the hospital emergency room.  If you develope a fever above 101 F, pus (white drainage) or increased drainage or redness at the wound, or calf pain, call your surgeon's office.     Change dressing    Complete by:  As directed   Change dressing daily with sterile 4 x 4 inch gauze dressing and apply TED hose. Do not submerge the incision under water.     Constipation Prevention    Complete by:  As directed   Drink plenty of fluids.  Prune juice may be helpful.  You may use a stool softener, such as Colace  (over the counter) 100 mg twice a day.  Use MiraLax (over the counter) for constipation as needed.     Diet general    Complete by:  As directed      Discharge instructions    Complete by:  As directed   Pick up stool softner and laxative for home. Do not submerge incision under water. May shower. Continue to use ice for pain and swelling from surgery.  Take Xarelto for two and a half more weeks, then discontinue Xarelto. Once the patient has completed the blood thinner regimen, then take a Baby 81 mg Aspirin daily for three more weeks.  When discharged from the skilled rehab facility, please have the facility set up the patient's [place] prior to being released.  Also provide the patient with their medications at time of release from the facility to include their pain medication, the muscle relaxants, and their blood thinner medication.  If the patient is still at the rehab facility at time of follow up appointment, please also assist the patient in arranging follow up appointment in our office and any transportation needs.     Do not put a pillow under the knee. Place it under the heel.    Complete by:  As directed      Do not sit on low chairs, stoools or toilet seats, as it may be difficult to get up from low surfaces    Complete by:  As directed      Driving restrictions    Complete by:  As directed   No driving until released by the physician.     Increase activity slowly as tolerated    Complete by:  As  directed      Lifting restrictions    Complete by:  As directed   No lifting until released by the physician.     Patient may shower    Complete by:  As directed   You may shower without a dressing once there is no drainage.  Do not wash over the wound.  If drainage remains, do not shower until drainage stops.     TED hose    Complete by:  As directed   Use stockings (TED hose) for 3 weeks on both leg(s).  You may remove them at night for sleeping.      Weight bearing as tolerated    Complete by:  As directed             Medication List    STOP taking these medications       azithromycin 250 MG tablet  Commonly known as:  ZITHROMAX     calcium-vitamin D 500-200 MG-UNIT per tablet  Commonly known as:  OSCAL WITH D     ergocalciferol 50000 UNITS capsule  Commonly known as:  VITAMIN D2     meloxicam 15 MG tablet  Commonly known as:  MOBIC     Omega 3 1200 MG Caps     predniSONE 10 MG tablet  Commonly known as:  DELTASONE     UNABLE TO FIND      TAKE these medications       acetaminophen 500 MG tablet  Commonly known as:  TYLENOL  Take 500-1,000 mg by mouth every 6 (six) hours as needed for mild pain or moderate pain. For pain.     albuterol 108 (90 BASE) MCG/ACT inhaler  Commonly known as:  PROVENTIL HFA;VENTOLIN HFA  Inhale 1 puff into the lungs every 6 (six) hours as needed for wheezing or shortness of breath.     bisacodyl 10 MG suppository  Commonly known as:  DULCOLAX  Place 1 suppository (10 mg total) rectally daily as needed for moderate constipation.     diazepam 5 MG tablet  Commonly known as:  VALIUM  Take 0.5 tablets (2.5 mg total) by mouth at bedtime as needed (prn sleep). For sleep.     DSS 100 MG Caps  Take 100 mg by mouth 2 (two) times daily.     famotidine 20 MG tablet  Commonly known as:  PEPCID  Take 20 mg by mouth daily.     Fluticasone-Salmeterol 250-50 MCG/DOSE Aepb  Commonly known as:  ADVAIR  Inhale 1 puff into the lungs 2 (two) times daily.     iron polysaccharides 150 MG capsule  Commonly known as:  NIFEREX  Take 1 capsule (150 mg total) by mouth daily.     loratadine-pseudoephedrine 10-240 MG per 24 hr tablet  Commonly known as:  CLARITIN-D 24-hour  Take 1 tablet by mouth daily.     methocarbamol 500 MG tablet  Commonly known as:  ROBAXIN  Take 1 tablet (500 mg total) by mouth every 6 (six) hours as needed for muscle spasms.     metoCLOPramide 5 MG tablet  Commonly known  as:  REGLAN  Take 1 tablet (5 mg total) by mouth every 8 (eight) hours as needed for nausea (if ondansetron (ZOFRAN) ineffective.).     metoprolol succinate 50 MG 24 hr tablet  Commonly known as:  TOPROL-XL  Take 50 mg by mouth every morning.     mometasone 50 MCG/ACT nasal spray  Commonly known as:  [place]  2 sprays into the nose daily.     NASONEX 50 MCG/ACT nasal spray  Generic drug:  mometasone  USE 1 TO 2 SPRAYS IN EACH  NOSTRIL DAILY     ondansetron 4 MG tablet  Commonly known as:  ZOFRAN  Take 1 tablet (4 mg total) by mouth every 6 (six) hours as needed for nausea.     oxyCODONE 5 MG immediate release tablet  Commonly known as:  Oxy IR/ROXICODONE  Take 1-2 tablets (5-10 mg total) by mouth every 3 (three) hours as needed for moderate pain, severe pain or breakthrough pain.     polyethylene glycol packet  Commonly known as:  MIRALAX / GLYCOLAX  Take 17 g by mouth daily as needed for mild constipation or moderate constipation.     rivaroxaban 10 MG Tabs tablet  Commonly known as:  XARELTO  - Take 1 tablet (10 mg total) by mouth daily with breakfast. Take Xarelto for two and a half more weeks, then discontinue Xarelto.  - Once the patient has completed the blood thinner regimen, then take a Baby 81 mg Aspirin daily for three more weeks.     sertraline 25 MG tablet  Commonly known as:  ZOLOFT  Take 25 mg by mouth every evening.           Follow-up Information   Follow up with Gearlean Alf, MD On 04/05/2014.   Specialty:  Orthopedic Surgery   Contact information:   [address] [place] [address] [phone]       Signed: Arlee Muslim, PA-C Orthopaedic Surgery 03/24/2014, 8:05 AM

## 2014-03-24 NOTE — Progress Notes (Signed)
Clinical Social Work Department CLINICAL SOCIAL WORK PLACEMENT NOTE 03/24/2014  Patient:  [name] [name], [name] [name]  Account Number:  [id number] Admit date:  03/21/2014  Clinical Social Worker:  Werner Lean, LCSW  Date/time:  03/21/2014 03:43 PM  Clinical Social Work is seeking post-discharge placement for this patient at the following level of care:   SKILLED NURSING   (*CSW will update this form in Epic as items are completed)   03/21/2014  Patient/family provided with [place] Department of Clinical Social Work's list of facilities offering this level of care within the geographic area requested by the patient (or if unable, by the patient's family).  03/21/2014  Patient/family informed of their freedom to choose among providers that offer the needed level of care, that participate in Medicare, Medicaid or managed care program needed by the patient, have an available bed and are willing to accept the patient.    Patient/family informed of [facility]' ownership interest in [facility], as well as of the fact that they are under no obligation to receive care at this facility.  PASARR submitted to EDS on 03/21/2014 PASARR number received from EDS on 03/21/2014  FL2 transmitted to all facilities in geographic area requested by pt/family on  03/21/2014 FL2 transmitted to all facilities within larger geographic area on   Patient informed that his/her managed care company has contracts with or will negotiate with  certain facilities, including the following:     Patient/family informed of bed offers received:  03/22/2014 Patient chooses bed at [facility] Physician recommends and patient chooses bed at    Patient to be transferred to [facility] on  03/24/2014 Patient to be transferred to facility by [facility]  The following physician request were entered in Epic:   Additional Comments:  Werner Lean LCSW [phone]

## 2014-03-29 ENCOUNTER — Non-Acute Institutional Stay (SKILLED_NURSING_FACILITY): Payer: Medicare Other | Admitting: Nurse Practitioner

## 2014-03-29 DIAGNOSIS — IMO0002 Reserved for concepts with insufficient information to code with codable children: Secondary | ICD-10-CM

## 2014-03-29 DIAGNOSIS — R195 Other fecal abnormalities: Secondary | ICD-10-CM

## 2014-03-29 DIAGNOSIS — J301 Allergic rhinitis due to pollen: Secondary | ICD-10-CM

## 2014-03-29 DIAGNOSIS — E871 Hypo-osmolality and hyponatremia: Secondary | ICD-10-CM | POA: Diagnosis not present

## 2014-03-29 DIAGNOSIS — J4489 Other specified chronic obstructive pulmonary disease: Secondary | ICD-10-CM

## 2014-03-29 DIAGNOSIS — J449 Chronic obstructive pulmonary disease, unspecified: Secondary | ICD-10-CM | POA: Diagnosis not present

## 2014-03-29 DIAGNOSIS — I10 Essential (primary) hypertension: Secondary | ICD-10-CM

## 2014-03-29 DIAGNOSIS — D62 Acute posthemorrhagic anemia: Secondary | ICD-10-CM | POA: Diagnosis not present

## 2014-03-29 DIAGNOSIS — M179 Osteoarthritis of knee, unspecified: Secondary | ICD-10-CM

## 2014-03-29 DIAGNOSIS — M1710 Unilateral primary osteoarthritis, unspecified knee: Secondary | ICD-10-CM

## 2014-03-29 NOTE — Progress Notes (Signed)
Patient ID: [name] [name], female   DOB: Sep 23, 1927, 80 y.o.   MRN: [id number]    Nursing Home Location:  [place] [facility] Service: SNF (63)  PCP: Wenda Low, MD  Allergies  Allergen Reactions  . Aspirin     REACTION: GI upset in large amounts  . Codeine Sulfate     REACTION: GI upset  . Penicillins Rash  . Septra Ds [Sulfamethoxazole-Trimethoprim] Rash    Chief Complaint  Patient presents with  . Hospitalization Follow-up    HPI:  ANALEISE [name] is a 80 y.o. year old female with end stage OA of her left knee who had with progressively worsening pain and dysfunction. She has constant pain, with activity and at rest and significant functional deficits with difficulties even with ADLs and after extensive non-op management including analgesics, injections of cortisone and viscosupplements, and home exercise program without relief she underwent left Total Knee Arthroplasty and is now at [facility] for on-going rehab   Review of Systems:  Review of Systems  Constitutional: Negative for fever, chills and malaise/fatigue.  Respiratory: Positive for shortness of breath (chronic and stable). Negative for cough.   Cardiovascular: Negative for chest pain and leg swelling.  Gastrointestinal: Negative for heartburn, abdominal pain, diarrhea and constipation.       Denies diarrhea but reports very loose stools  Genitourinary: Negative for dysuria, urgency and frequency.  Musculoskeletal: Positive for joint pain (s/p left total knee).  Skin: Negative.   Neurological: Negative for dizziness and headaches.  Endo/Heme/Allergies: Positive for environmental allergies.  Psychiatric/Behavioral: Negative for depression. The patient is not nervous/anxious and does not have insomnia.      Past Medical History  Diagnosis Date  . Chronic obstructive asthma, unspecified   . Allergic rhinitis, cause unspecified   . Angioneurotic edema not elsewhere classified   . Cancer  2006    lung ca  . Malignant neoplasm of bronchus and lung, unspecified site   . Environmental allergies   . Headache(784.0)   . Depression   . Arthritis   . Collapsed lung 1959    hx of with chest tube insertion  . Complication of anesthesia     severe headache x1  . UTI (urinary tract infection)    Past Surgical History  Procedure Laterality Date  . Right upper lobectomy  2006  . Abdominal hysterectomy    . Cholecystectomy  1969  . Closed manipulation shoulder Right 1990's    "frozen shoulder"  . Total knee arthroplasty Left 03/21/2014    Procedure: LEFT TOTAL KNEE ARTHROPLASTY;  Surgeon: Gearlean Alf, MD;  Location: [facility];  Service: Orthopedics;  Laterality: Left;   Social History:   reports that she quit smoking about 20 years ago. Her smoking use included Cigarettes. She has a 62.5 pack-year smoking history. She has never used smokeless tobacco. She reports that she does not drink alcohol or use illicit drugs.  Family History  Problem Relation Age of Onset  . Asthma    . Alzheimer's disease Mother   . Stroke Mother     Medications: Patient's Medications  New Prescriptions   No medications on file  Previous Medications   ACETAMINOPHEN (TYLENOL) 500 MG TABLET    Take 500-1,000 mg by mouth every 6 (six) hours as needed for mild pain or moderate pain. For pain.   ALBUTEROL (PROVENTIL HFA;VENTOLIN HFA) 108 (90 BASE) MCG/ACT INHALER    Inhale 1 puff into the lungs every 6 (six) hours as  needed for wheezing or shortness of breath.   BISACODYL (DULCOLAX) 10 MG SUPPOSITORY    Place 1 suppository (10 mg total) rectally daily as needed for moderate constipation.   DIAZEPAM (VALIUM) 5 MG TABLET    Take 1/2 tablet by mouth at bedtime as needed for sleep   DOCUSATE SODIUM 100 MG CAPS    Take 100 mg by mouth 2 (two) times daily.   FAMOTIDINE (PEPCID) 20 MG TABLET    Take 20 mg by mouth daily.     FLUTICASONE-SALMETEROL (ADVAIR) 250-50 MCG/DOSE AEPB    Inhale 1 puff into the lungs  2 (two) times daily.   IRON POLYSACCHARIDES (NIFEREX) 150 MG CAPSULE    Take 1 capsule (150 mg total) by mouth daily.   LORATADINE-PSEUDOEPHEDRINE (CLARITIN-D 24-HOUR) 10-240 MG PER 24 HR TABLET    Take 1 tablet by mouth daily.     METHOCARBAMOL (ROBAXIN) 500 MG TABLET    Take 1 tablet (500 mg total) by mouth every 6 (six) hours as needed for muscle spasms.   METOCLOPRAMIDE (REGLAN) 5 MG TABLET    Take 1 tablet (5 mg total) by mouth every 8 (eight) hours as needed for nausea (if ondansetron (ZOFRAN) ineffective.).   METOPROLOL (TOPROL-XL) 50 MG 24 HR TABLET    Take 50 mg by mouth every morning.    MOMETASONE (NASONEX) 50 MCG/ACT NASAL SPRAY    Place 2 sprays into the nose daily.   NASONEX 50 MCG/ACT NASAL SPRAY    USE 1 TO 2 SPRAYS IN EACH  NOSTRIL DAILY   ONDANSETRON (ZOFRAN) 4 MG TABLET    Take 1 tablet (4 mg total) by mouth every 6 (six) hours as needed for nausea.   OXYCODONE (OXY IR/ROXICODONE) 5 MG IMMEDIATE RELEASE TABLET    Take 1-2 tablets (5-10 mg total) by mouth every 3 (three) hours as needed for moderate pain, severe pain or breakthrough pain.   POLYETHYLENE GLYCOL (MIRALAX / GLYCOLAX) PACKET    Take 17 g by mouth daily as needed for mild constipation or moderate constipation.   RIVAROXABAN (XARELTO) 10 MG TABS TABLET    Take 1 tablet (10 mg total) by mouth daily with breakfast. Take Xarelto for two and a half more weeks, then discontinue Xarelto. Once the patient has completed the blood thinner regimen, then take a Baby 81 mg Aspirin daily for three more weeks.   SERTRALINE (ZOLOFT) 25 MG TABLET    Take 25 mg by mouth every evening.   Modified Medications   No medications on file  Discontinued Medications   No medications on file     Physical Exam: Physical Exam  Constitutional: She is oriented to person, place, and time and well-developed, well-nourished, and in no distress.  HENT:  Mouth/Throat: Oropharynx is clear and moist. No oropharyngeal exudate.  Eyes: Conjunctivae and  EOM are normal. Pupils are equal, round, and reactive to light.  Neck: Normal range of motion. Neck supple.  Cardiovascular: Normal rate, regular rhythm and normal heart sounds.   Pulmonary/Chest: Effort normal and breath sounds normal.  Abdominal: Soft. Bowel sounds are normal.  Musculoskeletal: She exhibits no edema (wears TEDs, improved edema) and no tenderness.  Knee in immobilizer   Neurological: She is alert and oriented to person, place, and time.  Skin: Skin is warm and dry.  Psychiatric: Affect normal.    Filed Vitals:   03/29/14 1418  BP: 134/75  Pulse: 78  Temp: 96.7 F (35.9 C)  Resp: 20  Labs reviewed: Basic Metabolic Panel:  Recent Labs  03/10/14 1230 03/22/14 0422 03/23/14 0419  NA 141 134* 139  K 5.1 4.8 4.5  CL 98 98 101  CO2 31 28 29   GLUCOSE 104* 146* 128*  BUN 28* 18 16  CREATININE 0.90 0.77 0.74  CALCIUM 10.0 8.4 8.6   Liver Function Tests:  Recent Labs  04/30/13 1106 03/10/14 1230  AST 20 22  ALT 20 16  ALKPHOS 60 65  BILITOT 0.93 0.7  PROT 7.1 7.7  ALBUMIN 4.1 4.7   No results found for this basename: LIPASE, AMYLASE,  in the last 8760 hours No results found for this basename: AMMONIA,  in the last 8760 hours CBC:  Recent Labs  04/30/13 1106  03/22/14 0422 03/23/14 0419 03/24/14 0456  WBC 10.8*  < > 15.6* 14.1* 9.5  NEUTROABS 8.0*  --   --   --   --   HGB 13.0  < > 9.8* 9.0* 8.8*  HCT 39.0  < > 29.6* 27.1* 25.8*  MCV 91.1  < > 93.7 94.1 94.5  PLT 290  < > 233 233 227  < > = values in this interval not displayed.   Assessment/Plan 1. OA (osteoarthritis) of knee, left S/p TOTAL KNEE- doing well with rehab, reports she is gaining strength and pain has improved and well controlled with current medications, pt currently taking Xarelto until 6/10 then take a Baby 81 mg Aspirin daily for three more weeks.  2. Postoperative anemia due to acute blood loss conts on niferex and Will follow up cbc in 1 week  3.  Hyponatremia Follow up bmp in 1 week  4. ASTHMA, CHRONIC OBSTRUCTIVE NOS -stale, conts on advair and PRN albuterol   5. Loose stool -will decrease colace to 1 time daily  6. Allergies, seasonal conts on Claritin and nasonex   7. Hypertension  conts on toprol   Labs/tests ordered Follow up cbc and bmp

## 2014-04-01 ENCOUNTER — Non-Acute Institutional Stay (SKILLED_NURSING_FACILITY): Payer: Medicare Other | Admitting: Internal Medicine

## 2014-04-01 DIAGNOSIS — J4489 Other specified chronic obstructive pulmonary disease: Secondary | ICD-10-CM

## 2014-04-01 DIAGNOSIS — D62 Acute posthemorrhagic anemia: Secondary | ICD-10-CM | POA: Diagnosis not present

## 2014-04-01 DIAGNOSIS — J449 Chronic obstructive pulmonary disease, unspecified: Secondary | ICD-10-CM

## 2014-04-01 DIAGNOSIS — J301 Allergic rhinitis due to pollen: Secondary | ICD-10-CM

## 2014-04-01 DIAGNOSIS — Z96659 Presence of unspecified artificial knee joint: Secondary | ICD-10-CM | POA: Diagnosis not present

## 2014-04-05 ENCOUNTER — Encounter: Payer: Self-pay | Admitting: Internal Medicine

## 2014-04-07 DIAGNOSIS — F329 Major depressive disorder, single episode, unspecified: Secondary | ICD-10-CM | POA: Diagnosis not present

## 2014-04-10 ENCOUNTER — Encounter: Payer: Self-pay | Admitting: Internal Medicine

## 2014-04-10 DIAGNOSIS — Z96659 Presence of unspecified artificial knee joint: Secondary | ICD-10-CM | POA: Insufficient documentation

## 2014-04-10 NOTE — Assessment & Plan Note (Signed)
claritin-D and nasonex

## 2014-04-10 NOTE — Assessment & Plan Note (Signed)
Continue MDI's

## 2014-04-10 NOTE — Assessment & Plan Note (Addendum)
Hb 13.4 to 8.8-no tx given; pt on iron

## 2014-04-10 NOTE — Progress Notes (Signed)
MRN: [id number] Name: [name] [name]  Sex: female Age: 80 y.o. DOB: [birth year]  [place] #: [facility] Facility/Room: 224A Level Of Care: SNF Provider: Hennie Duos Emergency Contacts: Extended Emergency Contact Information Primary Emergency Contact: Brame,Lynn Address: [address]          [place], [place] [address] [place] Phone: [phone] Relation: Relative  Code Status: FULL  Allergies: Aspirin; Codeine sulfate; Penicillins; and Septra ds  Chief Complaint  Patient presents with  . nursing home admission    HPI: Patient is 80 y.o. female who eas admitted for OT/Pt after L knee arthroplasty.  Past Medical History  Diagnosis Date  . Chronic obstructive asthma, unspecified   . Allergic rhinitis, cause unspecified   . Angioneurotic edema not elsewhere classified   . Cancer 2006    lung ca  . Malignant neoplasm of bronchus and lung, unspecified site   . Environmental allergies   . Headache(784.0)   . Depression   . Arthritis   . Collapsed lung 1959    hx of with chest tube insertion  . Complication of anesthesia     severe headache x1  . UTI (urinary tract infection)     Past Surgical History  Procedure Laterality Date  . Right upper lobectomy  2006  . Abdominal hysterectomy    . Cholecystectomy  1969  . Closed manipulation shoulder Right 1990's    "frozen shoulder"  . Total knee arthroplasty Left 03/21/2014    Procedure: LEFT TOTAL KNEE ARTHROPLASTY;  Surgeon: Gearlean Alf, MD;  Location: [facility];  Service: Orthopedics;  Laterality: Left;      Medication List       This list is accurate as of: 04/01/14 11:59 PM.  Always use your most recent med list.               acetaminophen 500 MG tablet  Commonly known as:  TYLENOL  Take 500-1,000 mg by mouth every 6 (six) hours as needed for mild pain or moderate pain. For pain.     albuterol 108 (90 BASE) MCG/ACT inhaler  Commonly known as:  PROVENTIL HFA;VENTOLIN HFA   Inhale 1 puff into the lungs every 6 (six) hours as needed for wheezing or shortness of breath.     bisacodyl 10 MG suppository  Commonly known as:  DULCOLAX  Place 1 suppository (10 mg total) rectally daily as needed for moderate constipation.     diazepam 5 MG tablet  Commonly known as:  VALIUM  Take 1/2 tablet by mouth at bedtime as needed for sleep     DSS 100 MG Caps  Take 100 mg by mouth 2 (two) times daily.     famotidine 20 MG tablet  Commonly known as:  PEPCID  Take 20 mg by mouth daily.     Fluticasone-Salmeterol 250-50 MCG/DOSE Aepb  Commonly known as:  ADVAIR  Inhale 1 puff into the lungs 2 (two) times daily.     iron polysaccharides 150 MG capsule  Commonly known as:  NIFEREX  Take 1 capsule (150 mg total) by mouth daily.     loratadine-pseudoephedrine 10-240 MG per 24 hr tablet  Commonly known as:  CLARITIN-D 24-hour  Take 1 tablet by mouth daily.     methocarbamol 500 MG tablet  Commonly known as:  ROBAXIN  Take 1 tablet (500 mg total) by mouth every 6 (six) hours as needed for muscle spasms.     metoCLOPramide 5 MG tablet  Commonly known  as:  REGLAN  Take 1 tablet (5 mg total) by mouth every 8 (eight) hours as needed for nausea (if ondansetron (ZOFRAN) ineffective.).     metoprolol succinate 50 MG 24 hr tablet  Commonly known as:  TOPROL-XL  Take 50 mg by mouth every morning.     mometasone 50 MCG/ACT nasal spray  Commonly known as:  NASONEX  Place 2 sprays into the nose daily.     NASONEX 50 MCG/ACT nasal spray  Generic drug:  mometasone  USE 1 TO 2 SPRAYS IN EACH  NOSTRIL DAILY     ondansetron 4 MG tablet  Commonly known as:  ZOFRAN  Take 1 tablet (4 mg total) by mouth every 6 (six) hours as needed for nausea.     oxyCODONE 5 MG immediate release tablet  Commonly known as:  Oxy IR/ROXICODONE  Take 1-2 tablets (5-10 mg total) by mouth every 3 (three) hours as needed for moderate pain, severe pain or breakthrough pain.     polyethylene glycol  packet  Commonly known as:  MIRALAX / GLYCOLAX  Take 17 g by mouth daily as needed for mild constipation or moderate constipation.     rivaroxaban 10 MG Tabs tablet  Commonly known as:  XARELTO  - Take 1 tablet (10 mg total) by mouth daily with breakfast. Take Xarelto for two and a half more weeks, then discontinue Xarelto.  - Once the patient has completed the blood thinner regimen, then take a Baby 81 mg Aspirin daily for three more weeks.     sertraline 25 MG tablet  Commonly known as:  ZOLOFT  Take 25 mg by mouth every evening.        No orders of the defined types were placed in this encounter.    Immunization History  Administered Date(s) Administered  . H1N1 10/25/2008  . Influenza Split 07/25/2011, 07/14/2012  . Influenza Whole 08/02/2009, 07/16/2010  . Influenza,inj,Quad PF,36+ Mos 07/19/2013  . Pneumococcal Polysaccharide-23 08/07/2001    History  Substance Use Topics  . Smoking status: Former Smoker -- 1.25 packs/day for 50 years    Types: Cigarettes    Quit date: 11/04/1993  . Smokeless tobacco: Never Used  . Alcohol Use: No    Family history is noncontributory    Review of Systems  DATA OBTAINED: from patient GENERAL: Feels well no fevers, fatigue, appetite changes SKIN: No itching, rash; incision OK EYES: No eye pain, redness, discharge EARS: No earache, tinnitus, change in hearing NOSE: No congestion, drainage or bleeding  MOUTH/THROAT: No mouth or tooth pain  RESPIRATORY: No cough, wheezing, SOB CARDIAC: No chest pain, palpitations, lower extremity edema  GI: No abdominal pain, No N/V/D or constipation, No heartburn or reflux  GU: No dysuria, frequency or urgency, or incontinence  MUSCULOSKELETAL: No unrelieved bone/joint pain; has muscle spasms NEUROLOGIC: No headache, dizziness or focal weakness PSYCHIATRIC: No overt anxiety or sadness. Sleeps well. No behavior issue.   Filed Vitals:   04/10/14 1430  BP: 137/63  Pulse: 90  Temp: 100.2 F  (37.9 C)  Resp: 18    Physical Exam  GENERAL APPEARANCE: Alert, conversant. Appropriately groomed. No acute distress.  SKIN: No diaphoresis rash; incision healing HEAD: Normocephalic, atraumatic  EYES: Conjunctiva/lids clear. Pupils round, reactive. EOMs intact.  EARS: External exam WNL, canals clear. Hearing grossly normal.  NOSE: No deformity or discharge.  MOUTH/THROAT: Lips w/o lesions. RESPIRATORY: Breathing is even, unlabored. Lung sounds are clear   CARDIOVASCULAR: Heart RRR no murmurs, rubs or gallops. No  peripheral edema.   GASTROINTESTINAL: Abdomen is soft, non-tender, not distended w/ normal bowel sounds GENITOURINARY: Bladder non tender, not distended  MUSCULOSKELETAL: No abnormal joints or musculature NEUROLOGIC: Oriented X3. Cranial nerves 2-12 grossly intact. Moves all extremities . PSYCHIATRIC: Mood and affect appropriate to situation, no behavioral issues  Patient Active Problem List   Diagnosis Date Noted  . S/P total knee arthroplasty 04/10/2014  . Postoperative anemia due to acute blood loss 03/22/2014  . Hyponatremia 03/22/2014  . OA (osteoarthritis) of knee 03/21/2014  . CHRONIC ETHMOIDAL SINUSITIS 01/15/2011  . Allergic rhinitis due to pollen 12/31/2010  . NECK PAIN 11/13/2010  . NEOP, MALIGNANT, BRONCHUS/LUNG NOS 08/19/2007  . ASTHMA, CHRONIC OBSTRUCTIVE NOS 08/19/2007  . ANGIOEDEMA 08/19/2007    CBC    Component Value Date/Time   WBC 9.5 03/24/2014 0456   WBC 10.8* 04/30/2013 1106   RBC 2.73* 03/24/2014 0456   RBC 4.28 04/30/2013 1106   HGB 8.8* 03/24/2014 0456   HGB 13.0 04/30/2013 1106   HCT 25.8* 03/24/2014 0456   HCT 39.0 04/30/2013 1106   PLT 227 03/24/2014 0456   PLT 290 04/30/2013 1106   MCV 94.5 03/24/2014 0456   MCV 91.1 04/30/2013 1106   LYMPHSABS 1.9 04/30/2013 1106   MONOABS 0.8 04/30/2013 1106   EOSABS 0.1 04/30/2013 1106   BASOSABS 0.1 04/30/2013 1106    CMP     Component Value Date/Time   NA 139 03/23/2014 0419   NA 136 04/30/2013  1106   NA 138 04/24/2012 1112   K 4.5 03/23/2014 0419   K 5.0 04/30/2013 1106   K 4.5 04/24/2012 1112   CL 101 03/23/2014 0419   CL 94* 04/24/2012 1112   CO2 29 03/23/2014 0419   CO2 29 04/30/2013 1106   CO2 30 04/24/2012 1112   GLUCOSE 128* 03/23/2014 0419   GLUCOSE 94 04/30/2013 1106   GLUCOSE 94 04/24/2012 1112   BUN 16 03/23/2014 0419   BUN 19.4 04/30/2013 1106   BUN 22 04/24/2012 1112   CREATININE 0.74 03/23/2014 0419   CREATININE 1.1 04/30/2013 1106   CREATININE 0.7 04/24/2012 1112   CALCIUM 8.6 03/23/2014 0419   CALCIUM 10.2 04/30/2013 1106   CALCIUM 9.5 04/24/2012 1112   PROT 7.7 03/10/2014 1230   PROT 7.1 04/30/2013 1106   PROT 7.3 04/24/2012 1112   ALBUMIN 4.7 03/10/2014 1230   ALBUMIN 4.1 04/30/2013 1106   AST 22 03/10/2014 1230   AST 20 04/30/2013 1106   AST 24 04/24/2012 1112   ALT 16 03/10/2014 1230   ALT 20 04/30/2013 1106   ALT 20 04/24/2012 1112   ALKPHOS 65 03/10/2014 1230   ALKPHOS 60 04/30/2013 1106   ALKPHOS 62 04/24/2012 1112   BILITOT 0.7 03/10/2014 1230   BILITOT 0.93 04/30/2013 1106   BILITOT 1.00 04/24/2012 1112   GFRNONAA 75* 03/23/2014 0419   GFRAA 87* 03/23/2014 0419    Assessment and Plan  S/P total knee arthroplasty fOR ENDSTAGE oa;ON ROBAXIN AND PAIN MEDS;xarelto as prophylaxis for 2 1/2 more weeks then back to 81 mg  Postoperative anemia due to acute blood loss Hb 13.4 to 8.8-no tx given; pt on iron  ASTHMA, CHRONIC OBSTRUCTIVE NOS Continue MDI's  Allergic rhinitis due to pollen claritin-D and nasonex    Hennie Duos, MD

## 2014-04-10 NOTE — Assessment & Plan Note (Addendum)
fOR ENDSTAGE oa;ON ROBAXIN AND PAIN MEDS;robaxin has been scheduled as pt c/o muscle spasms;xarelto as prophylaxis for 2 1/2 more weeks then back to 81 mg

## 2014-04-22 ENCOUNTER — Non-Acute Institutional Stay (SKILLED_NURSING_FACILITY): Payer: Medicare Other | Admitting: Nurse Practitioner

## 2014-04-22 DIAGNOSIS — M1712 Unilateral primary osteoarthritis, left knee: Secondary | ICD-10-CM

## 2014-04-22 DIAGNOSIS — J301 Allergic rhinitis due to pollen: Secondary | ICD-10-CM

## 2014-04-22 DIAGNOSIS — Z96652 Presence of left artificial knee joint: Secondary | ICD-10-CM

## 2014-04-22 DIAGNOSIS — Z96659 Presence of unspecified artificial knee joint: Secondary | ICD-10-CM | POA: Diagnosis not present

## 2014-04-22 DIAGNOSIS — M1710 Unilateral primary osteoarthritis, unspecified knee: Secondary | ICD-10-CM | POA: Diagnosis not present

## 2014-04-28 NOTE — Progress Notes (Signed)
Patient ID: [name] [name], female   DOB: 1927/01/29, 80 y.o.   MRN: [id number]    Nursing Home Location:  [place] of Service: SNF (53)  PCP: Wenda Low, MD  Allergies  Allergen Reactions  . Aspirin     REACTION: GI upset in large amounts  . Codeine Sulfate     REACTION: GI upset  . Penicillins Rash  . Septra Ds [Sulfamethoxazole-Trimethoprim] Rash    Chief Complaint  Patient presents with  . Medical Management of Chronic Issues    Routine Visit     HPI:  [name] [name] is a 80 y.o. year old female with end stage OA who is now s/p left total knee and is at [facility] for rehab. Pt with a pmh of asthma, chronic sinusitis and allergic rhinitis, and post op anemia. Pt reports constipation after surgery but this has since resolved. Pt was getting valium at home for sleep and this is now as needed however she has not asked for it and reports her sleep has been good. Pt reports she is doing well and without complaints. Pain in knee has improved.  Review of Systems:  Review of Systems  Constitutional: Negative for fever, chills and malaise/fatigue.  HENT: Negative for congestion, hearing loss, sore throat and tinnitus.   Respiratory: Negative for cough and shortness of breath.   Cardiovascular: Negative for chest pain and leg swelling.  Gastrointestinal: Negative for heartburn, abdominal pain, diarrhea and constipation.  Genitourinary: Negative for dysuria, urgency and frequency.  Musculoskeletal: Positive for joint pain (s/p left total knee).  Skin: Negative.   Neurological: Negative for dizziness and headaches.  Endo/Heme/Allergies: Environmental allergies: reports allergies are stable- without current symptoms.  Psychiatric/Behavioral: Negative for depression. The patient is not nervous/anxious and does not have insomnia.      Past Medical History  Diagnosis Date  . Chronic obstructive asthma, unspecified   . Allergic rhinitis, cause  unspecified   . Angioneurotic edema not elsewhere classified   . Cancer 2006    lung ca  . Malignant neoplasm of bronchus and lung, unspecified site   . Environmental allergies   . Headache(784.0)   . Depression   . Arthritis   . Collapsed lung 1959    hx of with chest tube insertion  . Complication of anesthesia     severe headache x1  . UTI (urinary tract infection)    Past Surgical History  Procedure Laterality Date  . Right upper lobectomy  2006  . Abdominal hysterectomy    . Cholecystectomy  1969  . Closed manipulation shoulder Right 1990's    "frozen shoulder"  . Total knee arthroplasty Left 03/21/2014    Procedure: LEFT TOTAL KNEE ARTHROPLASTY;  Surgeon: Gearlean Alf, MD;  Location: [facility];  Service: Orthopedics;  Laterality: Left;   Social History:   reports that she quit smoking about 20 years ago. Her smoking use included Cigarettes. She has a 62.5 pack-year smoking history. She has never used smokeless tobacco. She reports that she does not drink alcohol or use illicit drugs.  Family History  Problem Relation Age of Onset  . Asthma    . Alzheimer's disease Mother   . Stroke Mother     Medications: Patient's Medications  New Prescriptions   No medications on file  Previous Medications   ACETAMINOPHEN (TYLENOL) 500 MG TABLET    Take 500-1,000 mg by mouth every 6 (six) hours as needed for mild pain or moderate pain. For  pain.   ALBUTEROL (PROVENTIL HFA;VENTOLIN HFA) 108 (90 BASE) MCG/ACT INHALER    Inhale 1 puff into the lungs every 6 (six) hours as needed for wheezing or shortness of breath.   BISACODYL (DULCOLAX) 10 MG SUPPOSITORY    Place 1 suppository (10 mg total) rectally daily as needed for moderate constipation.   DIAZEPAM (VALIUM) 5 MG TABLET    Take 1/2 tablet by mouth at bedtime as needed for sleep   DOCUSATE SODIUM 100 MG CAPS    Take 100 mg by mouth 2 (two) times daily.   FAMOTIDINE (PEPCID) 20 MG TABLET    Take 20 mg by mouth daily.      FLUTICASONE-SALMETEROL (ADVAIR) 250-50 MCG/DOSE AEPB    Inhale 1 puff into the lungs 2 (two) times daily.   IRON POLYSACCHARIDES (NIFEREX) 150 MG CAPSULE    Take 1 capsule (150 mg total) by mouth daily.   LORATADINE-PSEUDOEPHEDRINE (CLARITIN-D 24-HOUR) 10-240 MG PER 24 HR TABLET    Take 1 tablet by mouth daily.     METHOCARBAMOL (ROBAXIN) 500 MG TABLET    Take 1 tablet (500 mg total) by mouth every 6 (six) hours as needed for muscle spasms.   METOCLOPRAMIDE (REGLAN) 5 MG TABLET    Take 1 tablet (5 mg total) by mouth every 8 (eight) hours as needed for nausea (if ondansetron (ZOFRAN) ineffective.).   METOPROLOL (TOPROL-XL) 50 MG 24 HR TABLET    Take 50 mg by mouth every morning.    MOMETASONE (NASONEX) 50 MCG/ACT NASAL SPRAY    Place 2 sprays into the nose daily.   NASONEX 50 MCG/ACT NASAL SPRAY    USE 1 TO 2 SPRAYS IN EACH  NOSTRIL DAILY   ONDANSETRON (ZOFRAN) 4 MG TABLET    Take 1 tablet (4 mg total) by mouth every 6 (six) hours as needed for nausea.   OXYCODONE (OXY IR/ROXICODONE) 5 MG IMMEDIATE RELEASE TABLET    Take 1-2 tablets (5-10 mg total) by mouth every 3 (three) hours as needed for moderate pain, severe pain or breakthrough pain.   POLYETHYLENE GLYCOL (MIRALAX / GLYCOLAX) PACKET    Take 17 g by mouth daily as needed for mild constipation or moderate constipation.   RIVAROXABAN (XARELTO) 10 MG TABS TABLET    Take 1 tablet (10 mg total) by mouth daily with breakfast. Take Xarelto for two and a half more weeks, then discontinue Xarelto. Once the patient has completed the blood thinner regimen, then take a Baby 81 mg Aspirin daily for three more weeks.   SERTRALINE (ZOLOFT) 25 MG TABLET    Take 25 mg by mouth every evening.   Modified Medications   No medications on file  Discontinued Medications   No medications on file     Physical Exam:  Filed Vitals:   04/22/14 1327  BP: 125/62  Pulse: 66  Temp: 98 F (36.7 C)  TempSrc: Oral  Resp: 18  Weight: 158 lb (71.668 kg)     Physical Exam  Constitutional: She is oriented to person, place, and time and well-developed, well-nourished, and in no distress.  HENT:  Mouth/Throat: Oropharynx is clear and moist. No oropharyngeal exudate.  Eyes: Conjunctivae and EOM are normal. Pupils are equal, round, and reactive to light.  Neck: Normal range of motion. Neck supple.  Cardiovascular: Normal rate, regular rhythm and normal heart sounds.   Pulmonary/Chest: Effort normal and breath sounds normal.  Abdominal: Soft. Bowel sounds are normal.  Musculoskeletal: She exhibits no edema (wears TEDs, improved edema)  and no tenderness.  Knee in immobilizer   Neurological: She is alert and oriented to person, place, and time.  Skin: Skin is warm and dry.  Psychiatric: Affect normal.     Labs reviewed: Basic Metabolic Panel:  Recent Labs  03/10/14 1230 03/22/14 0422 03/23/14 0419  NA 141 134* 139  K 5.1 4.8 4.5  CL 98 98 101  CO2 31 28 29   GLUCOSE 104* 146* 128*  BUN 28* 18 16  CREATININE 0.90 0.77 0.74  CALCIUM 10.0 8.4 8.6   Liver Function Tests:  Recent Labs  04/30/13 1106 03/10/14 1230  AST 20 22  ALT 20 16  ALKPHOS 60 65  BILITOT 0.93 0.7  PROT 7.1 7.7  ALBUMIN 4.1 4.7    CBC:  Recent Labs  04/30/13 1106  03/22/14 0422 03/23/14 0419 03/24/14 0456  WBC 10.8*  < > 15.6* 14.1* 9.5  NEUTROABS 8.0*  --   --   --   --   HGB 13.0  < > 9.8* 9.0* 8.8*  HCT 39.0  < > 29.6* 27.1* 25.8*  MCV 91.1  < > 93.7 94.1 94.5  PLT 290  < > 233 233 227  < > = values in this interval not displayed.  Assessment/Plan 1. Allergic rhinitis due to pollen - will change to claritin and cont to monitor for worsening of symptoms  2. Primary osteoarthritis of left knee S/p left total knee ambulating well and improvement with pain  3. Status post total left knee replacement -conts to work with therapy making progress, pain is well controlled   4. GERD Stable on medications  5. Insomnia - has valium PRN has  not need, no problems with insomnia at this time  6. Anemia -conts iron

## 2014-04-29 ENCOUNTER — Non-Acute Institutional Stay (SKILLED_NURSING_FACILITY): Payer: Medicare Other | Admitting: Nurse Practitioner

## 2014-04-29 DIAGNOSIS — K5900 Constipation, unspecified: Secondary | ICD-10-CM | POA: Insufficient documentation

## 2014-04-29 DIAGNOSIS — J301 Allergic rhinitis due to pollen: Secondary | ICD-10-CM | POA: Diagnosis not present

## 2014-04-29 DIAGNOSIS — D62 Acute posthemorrhagic anemia: Secondary | ICD-10-CM | POA: Diagnosis not present

## 2014-04-29 DIAGNOSIS — Z96652 Presence of left artificial knee joint: Secondary | ICD-10-CM

## 2014-04-29 DIAGNOSIS — J449 Chronic obstructive pulmonary disease, unspecified: Secondary | ICD-10-CM | POA: Diagnosis not present

## 2014-04-29 DIAGNOSIS — Z96659 Presence of unspecified artificial knee joint: Secondary | ICD-10-CM

## 2014-04-29 NOTE — Progress Notes (Signed)
Patient ID: [name] [name], female   DOB: [birth year], 80 y.o.   MRN: [id number]    Nursing Home Location:  [place] of Service: SNF (15)  PCP: Wenda Low, MD  Allergies  Allergen Reactions  . Aspirin     REACTION: GI upset in large amounts  . Codeine Sulfate     REACTION: GI upset  . Penicillins Rash  . Septra Ds [Sulfamethoxazole-Trimethoprim] Rash    Chief Complaint  Patient presents with  . Discharge Note    HPI:  [name] [name] is a 80 y.o. year old female with a pmh of asthma, chronic sinusitis and allergic rhinitis, and post op anemia with end stage OA who is now s/p left total knee and is at [facility] for rehab. Pt is being seen today for discharge home.  Patient currently doing well with therapy, now stable to discharge home with home health. Pt reports over the past week constipation has gotten worse otherwise doing well. Pain is controlled. No worsening allergic rhinitis with changing Claritin to plain form.   Review of Systems:  Review of Systems  Constitutional: Negative for fever, chills and malaise/fatigue.  HENT: Negative for congestion, hearing loss, sore throat and tinnitus.   Respiratory: Negative for cough and shortness of breath.   Cardiovascular: Negative for chest pain and leg swelling.  Gastrointestinal: Positive for constipation. Negative for heartburn, abdominal pain and diarrhea.  Genitourinary: Negative for dysuria, urgency and frequency.  Musculoskeletal: Positive for joint pain (s/p left total knee-pain has improved, reports infrequent need for pain medication).  Skin: Negative.   Neurological: Negative for dizziness and headaches.  Endo/Heme/Allergies: Environmental allergies: reports allergies are stable- without current symptoms.  Psychiatric/Behavioral: Negative for depression. The patient is not nervous/anxious and does not have insomnia.      Past Medical History  Diagnosis Date  . Chronic obstructive  asthma, unspecified   . Allergic rhinitis, cause unspecified   . Angioneurotic edema not elsewhere classified   . Cancer 2006    lung ca  . Malignant neoplasm of bronchus and lung, unspecified site   . Environmental allergies   . Headache(784.0)   . Depression   . Arthritis   . Collapsed lung 1959    hx of with chest tube insertion  . Complication of anesthesia     severe headache x1  . UTI (urinary tract infection)    Past Surgical History  Procedure Laterality Date  . Right upper lobectomy  2006  . Abdominal hysterectomy    . Cholecystectomy  1969  . Closed manipulation shoulder Right 1990's    "frozen shoulder"  . Total knee arthroplasty Left 03/21/2014    Procedure: LEFT TOTAL KNEE ARTHROPLASTY;  Surgeon: Gearlean Alf, MD;  Location: [facility];  Service: Orthopedics;  Laterality: Left;   Social History:   reports that she quit smoking about 20 years ago. Her smoking use included Cigarettes. She has a 62.5 pack-year smoking history. She has never used smokeless tobacco. She reports that she does not drink alcohol or use illicit drugs.  Family History  Problem Relation Age of Onset  . Asthma    . Alzheimer's disease Mother   . Stroke Mother     Medications: Patient's Medications  New Prescriptions   No medications on file  Previous Medications   ACETAMINOPHEN (TYLENOL) 500 MG TABLET    Take 500-1,000 mg by mouth every 6 (six) hours as needed for mild pain or moderate pain. For pain.  ALBUTEROL (PROVENTIL HFA;VENTOLIN HFA) 108 (90 BASE) MCG/ACT INHALER    Inhale 1 puff into the lungs every 6 (six) hours as needed for wheezing or shortness of breath.   BISACODYL (DULCOLAX) 10 MG SUPPOSITORY    Place 1 suppository (10 mg total) rectally daily as needed for moderate constipation.   DIAZEPAM (VALIUM) 5 MG TABLET    Take 1/2 tablet by mouth at bedtime as needed for sleep   DOCUSATE SODIUM 100 MG CAPS    Take 100 mg by mouth 2 (two) times daily.   FAMOTIDINE (PEPCID) 20 MG  TABLET    Take 20 mg by mouth daily.     FLUTICASONE-SALMETEROL (ADVAIR) 250-50 MCG/DOSE AEPB    Inhale 1 puff into the lungs 2 (two) times daily.   IRON POLYSACCHARIDES (NIFEREX) 150 MG CAPSULE    Take 1 capsule (150 mg total) by mouth daily.   LORATADINE (CLARITIN) 10 MG TABLET    Take 10 mg by mouth daily.   LORATADINE-PSEUDOEPHEDRINE (CLARITIN-D 24-HOUR) 10-240 MG PER 24 HR TABLET    Take 1 tablet by mouth daily.     METHOCARBAMOL (ROBAXIN) 500 MG TABLET    Take 1 tablet (500 mg total) by mouth every 6 (six) hours as needed for muscle spasms.   METOCLOPRAMIDE (REGLAN) 5 MG TABLET    Take 1 tablet (5 mg total) by mouth every 8 (eight) hours as needed for nausea (if ondansetron (ZOFRAN) ineffective.).   METOPROLOL (TOPROL-XL) 50 MG 24 HR TABLET    Take 50 mg by mouth every morning.    MOMETASONE (NASONEX) 50 MCG/ACT NASAL SPRAY    Place 2 sprays into the nose daily.   NASONEX 50 MCG/ACT NASAL SPRAY    USE 1 TO 2 SPRAYS IN EACH  NOSTRIL DAILY   ONDANSETRON (ZOFRAN) 4 MG TABLET    Take 1 tablet (4 mg total) by mouth every 6 (six) hours as needed for nausea.   OXYCODONE (OXY IR/ROXICODONE) 5 MG IMMEDIATE RELEASE TABLET    Take 1-2 tablets (5-10 mg total) by mouth every 3 (three) hours as needed for moderate pain, severe pain or breakthrough pain.   POLYETHYLENE GLYCOL (MIRALAX / GLYCOLAX) PACKET    Take 17 g by mouth daily as needed for mild constipation or moderate constipation.   SERTRALINE (ZOLOFT) 25 MG TABLET    Take 25 mg by mouth every evening.   Modified Medications   No medications on file  Discontinued Medications   RIVAROXABAN (XARELTO) 10 MG TABS TABLET    Take 1 tablet (10 mg total) by mouth daily with breakfast. Take Xarelto for two and a half more weeks, then discontinue Xarelto. Once the patient has completed the blood thinner regimen, then take a Baby 81 mg Aspirin daily for three more weeks.     Physical Exam: Physical Exam  Constitutional: She is oriented to person, place,  and time and well-developed, well-nourished, and in no distress.  HENT:  Mouth/Throat: Oropharynx is clear and moist. No oropharyngeal exudate.  Eyes: Conjunctivae and EOM are normal. Pupils are equal, round, and reactive to light.  Neck: Normal range of motion. Neck supple.  Cardiovascular: Normal rate, regular rhythm and normal heart sounds.   Pulmonary/Chest: Effort normal and breath sounds normal.  Abdominal: Soft. Bowel sounds are normal. She exhibits no distension. There is no tenderness.  Musculoskeletal: She exhibits no edema (wears TEDs, improved edema) and no tenderness.  Neurological: She is alert and oriented to person, place, and time.  Skin: Skin is  warm and dry.  Psychiatric: Affect normal.    Filed Vitals:   04/29/14 1718  BP: 124/66  Pulse: 72  Temp: 98 F (36.7 C)  Resp: 18      Labs reviewed: Basic Metabolic Panel:  Recent Labs  03/10/14 1230 03/22/14 0422 03/23/14 0419  NA 141 134* 139  K 5.1 4.8 4.5  CL 98 98 101  CO2 31 28 29   GLUCOSE 104* 146* 128*  BUN 28* 18 16  CREATININE 0.90 0.77 0.74  CALCIUM 10.0 8.4 8.6   Liver Function Tests:  Recent Labs  04/30/13 1106 03/10/14 1230  AST 20 22  ALT 20 16  ALKPHOS 60 65  BILITOT 0.93 0.7  PROT 7.1 7.7  ALBUMIN 4.1 4.7   No results found for this basename: LIPASE, AMYLASE,  in the last 8760 hours No results found for this basename: AMMONIA,  in the last 8760 hours CBC:  Recent Labs  04/30/13 1106  03/22/14 0422 03/23/14 0419 03/24/14 0456  WBC 10.8*  < > 15.6* 14.1* 9.5  NEUTROABS 8.0*  --   --   --   --   HGB 13.0  < > 9.8* 9.0* 8.8*  HCT 39.0  < > 29.6* 27.1* 25.8*  MCV 91.1  < > 93.7 94.1 94.5  PLT 290  < > 233 233 227  < > = values in this interval not displayed.  Assessment/Plan 1. Allergic rhinitis due to pollen Stable on claritin and nsonex  2. ASTHMA, CHRONIC OBSTRUCTIVE NOS -stable at this time, conts advair   3. Unspecified constipation -pt reports increasing  water intake, on colace, will add miralax daily   4. Postoperative anemia due to acute blood loss Will follow up cbc before discharge  5. Status post total left knee replacement pt is stable for discharge-will need PT/OT per home health. No DME needed. Rx written.  will need to follow up with PCP within 2 weeks.

## 2014-05-03 DIAGNOSIS — Z471 Aftercare following joint replacement surgery: Secondary | ICD-10-CM | POA: Diagnosis not present

## 2014-05-03 DIAGNOSIS — Z96659 Presence of unspecified artificial knee joint: Secondary | ICD-10-CM | POA: Diagnosis not present

## 2014-05-04 ENCOUNTER — Ambulatory Visit: Payer: Medicare Other | Admitting: Internal Medicine

## 2014-05-04 ENCOUNTER — Other Ambulatory Visit: Payer: Medicare Other

## 2014-05-04 ENCOUNTER — Ambulatory Visit (HOSPITAL_COMMUNITY)
Admission: RE | Admit: 2014-05-04 | Discharge: 2014-05-04 | Disposition: A | Discharge status: Home / Self Care | Payer: Medicare Other | Source: Ambulatory Visit | Attending: Internal Medicine | Admitting: Internal Medicine

## 2014-05-04 DIAGNOSIS — Z5189 Encounter for other specified aftercare: Secondary | ICD-10-CM | POA: Diagnosis not present

## 2014-05-04 DIAGNOSIS — Z880 Allergy status to penicillin: Secondary | ICD-10-CM | POA: Diagnosis not present

## 2014-05-04 DIAGNOSIS — X58XXXA Exposure to other specified factors, initial encounter: Secondary | ICD-10-CM | POA: Diagnosis not present

## 2014-05-04 DIAGNOSIS — T148XXA Other injury of unspecified body region, initial encounter: Secondary | ICD-10-CM | POA: Diagnosis not present

## 2014-05-04 DIAGNOSIS — IMO0001 Reserved for inherently not codable concepts without codable children: Secondary | ICD-10-CM | POA: Diagnosis not present

## 2014-05-04 DIAGNOSIS — Z820 Family history of epilepsy and other diseases of the nervous system: Secondary | ICD-10-CM | POA: Diagnosis not present

## 2014-05-04 DIAGNOSIS — R9431 Abnormal electrocardiogram [ECG] [EKG]: Secondary | ICD-10-CM | POA: Diagnosis not present

## 2014-05-04 DIAGNOSIS — M25569 Pain in unspecified knee: Secondary | ICD-10-CM | POA: Diagnosis not present

## 2014-05-04 DIAGNOSIS — Z87891 Personal history of nicotine dependence: Secondary | ICD-10-CM | POA: Diagnosis not present

## 2014-05-04 DIAGNOSIS — S72023A Displaced fracture of epiphysis (separation) (upper) of unspecified femur, initial encounter for closed fracture: Secondary | ICD-10-CM | POA: Diagnosis not present

## 2014-05-04 DIAGNOSIS — I10 Essential (primary) hypertension: Secondary | ICD-10-CM | POA: Diagnosis not present

## 2014-05-04 DIAGNOSIS — F329 Major depressive disorder, single episode, unspecified: Secondary | ICD-10-CM | POA: Diagnosis present

## 2014-05-04 DIAGNOSIS — S8990XA Unspecified injury of unspecified lower leg, initial encounter: Secondary | ICD-10-CM | POA: Diagnosis not present

## 2014-05-04 DIAGNOSIS — S79919A Unspecified injury of unspecified hip, initial encounter: Secondary | ICD-10-CM | POA: Diagnosis not present

## 2014-05-04 DIAGNOSIS — M6281 Muscle weakness (generalized): Secondary | ICD-10-CM | POA: Diagnosis not present

## 2014-05-04 DIAGNOSIS — E871 Hypo-osmolality and hyponatremia: Secondary | ICD-10-CM | POA: Diagnosis not present

## 2014-05-04 DIAGNOSIS — F0390 Unspecified dementia without behavioral disturbance: Secondary | ICD-10-CM | POA: Diagnosis not present

## 2014-05-04 DIAGNOSIS — Z823 Family history of stroke: Secondary | ICD-10-CM | POA: Diagnosis not present

## 2014-05-04 DIAGNOSIS — S298XXA Other specified injuries of thorax, initial encounter: Secondary | ICD-10-CM | POA: Diagnosis not present

## 2014-05-04 DIAGNOSIS — J42 Unspecified chronic bronchitis: Secondary | ICD-10-CM | POA: Diagnosis not present

## 2014-05-04 DIAGNOSIS — J301 Allergic rhinitis due to pollen: Secondary | ICD-10-CM | POA: Diagnosis not present

## 2014-05-04 DIAGNOSIS — Z886 Allergy status to analgesic agent status: Secondary | ICD-10-CM | POA: Diagnosis not present

## 2014-05-04 DIAGNOSIS — D62 Acute posthemorrhagic anemia: Secondary | ICD-10-CM | POA: Diagnosis not present

## 2014-05-04 DIAGNOSIS — Z471 Aftercare following joint replacement surgery: Secondary | ICD-10-CM | POA: Diagnosis not present

## 2014-05-04 DIAGNOSIS — F411 Generalized anxiety disorder: Secondary | ICD-10-CM | POA: Diagnosis not present

## 2014-05-04 DIAGNOSIS — Z85118 Personal history of other malignant neoplasm of bronchus and lung: Secondary | ICD-10-CM | POA: Diagnosis not present

## 2014-05-04 DIAGNOSIS — Z0389 Encounter for observation for other suspected diseases and conditions ruled out: Secondary | ICD-10-CM | POA: Diagnosis not present

## 2014-05-04 DIAGNOSIS — J449 Chronic obstructive pulmonary disease, unspecified: Secondary | ICD-10-CM | POA: Diagnosis not present

## 2014-05-04 DIAGNOSIS — R51 Headache: Secondary | ICD-10-CM | POA: Diagnosis not present

## 2014-05-04 DIAGNOSIS — S32509A Unspecified fracture of unspecified pubis, initial encounter for closed fracture: Secondary | ICD-10-CM | POA: Diagnosis not present

## 2014-05-04 DIAGNOSIS — M25559 Pain in unspecified hip: Secondary | ICD-10-CM | POA: Diagnosis present

## 2014-05-04 DIAGNOSIS — Z885 Allergy status to narcotic agent status: Secondary | ICD-10-CM | POA: Diagnosis not present

## 2014-05-04 DIAGNOSIS — Z7982 Long term (current) use of aspirin: Secondary | ICD-10-CM | POA: Diagnosis not present

## 2014-05-04 DIAGNOSIS — Z96659 Presence of unspecified artificial knee joint: Secondary | ICD-10-CM | POA: Diagnosis not present

## 2014-05-04 DIAGNOSIS — Z825 Family history of asthma and other chronic lower respiratory diseases: Secondary | ICD-10-CM | POA: Diagnosis not present

## 2014-05-04 DIAGNOSIS — S72009A Fracture of unspecified part of neck of unspecified femur, initial encounter for closed fracture: Secondary | ICD-10-CM | POA: Diagnosis not present

## 2014-05-04 DIAGNOSIS — S0990XA Unspecified injury of head, initial encounter: Secondary | ICD-10-CM | POA: Diagnosis not present

## 2014-05-04 DIAGNOSIS — Z881 Allergy status to other antibiotic agents status: Secondary | ICD-10-CM | POA: Diagnosis not present

## 2014-05-04 DIAGNOSIS — M129 Arthropathy, unspecified: Secondary | ICD-10-CM | POA: Diagnosis not present

## 2014-05-04 DIAGNOSIS — Z79899 Other long term (current) drug therapy: Secondary | ICD-10-CM | POA: Diagnosis not present

## 2014-05-05 ENCOUNTER — Ambulatory Visit: Payer: Medicare Other | Admitting: Internal Medicine

## 2014-05-06 ENCOUNTER — Emergency Department (HOSPITAL_COMMUNITY): Payer: Medicare Other

## 2014-05-06 ENCOUNTER — Encounter (HOSPITAL_COMMUNITY): Payer: Self-pay | Admitting: Emergency Medicine

## 2014-05-06 ENCOUNTER — Emergency Department (HOSPITAL_COMMUNITY)
Admission: EM | Admit: 2014-05-06 | Discharge: 2014-05-06 | Disposition: A | Discharge status: Home / Self Care | Payer: Medicare Other | Attending: Emergency Medicine | Admitting: Emergency Medicine

## 2014-05-06 DIAGNOSIS — S99919A Unspecified injury of unspecified ankle, initial encounter: Secondary | ICD-10-CM

## 2014-05-06 DIAGNOSIS — S298XXA Other specified injuries of thorax, initial encounter: Secondary | ICD-10-CM | POA: Diagnosis not present

## 2014-05-06 DIAGNOSIS — Y9289 Other specified places as the place of occurrence of the external cause: Secondary | ICD-10-CM | POA: Insufficient documentation

## 2014-05-06 DIAGNOSIS — W19XXXA Unspecified fall, initial encounter: Secondary | ICD-10-CM

## 2014-05-06 DIAGNOSIS — R51 Headache: Secondary | ICD-10-CM | POA: Insufficient documentation

## 2014-05-06 DIAGNOSIS — IMO0002 Reserved for concepts with insufficient information to code with codable children: Secondary | ICD-10-CM | POA: Insufficient documentation

## 2014-05-06 DIAGNOSIS — Z880 Allergy status to penicillin: Secondary | ICD-10-CM | POA: Insufficient documentation

## 2014-05-06 DIAGNOSIS — S32509A Unspecified fracture of unspecified pubis, initial encounter for closed fracture: Secondary | ICD-10-CM | POA: Insufficient documentation

## 2014-05-06 DIAGNOSIS — Y9389 Activity, other specified: Secondary | ICD-10-CM | POA: Insufficient documentation

## 2014-05-06 DIAGNOSIS — Z85118 Personal history of other malignant neoplasm of bronchus and lung: Secondary | ICD-10-CM | POA: Insufficient documentation

## 2014-05-06 DIAGNOSIS — Z0389 Encounter for observation for other suspected diseases and conditions ruled out: Secondary | ICD-10-CM | POA: Diagnosis not present

## 2014-05-06 DIAGNOSIS — F329 Major depressive disorder, single episode, unspecified: Secondary | ICD-10-CM | POA: Insufficient documentation

## 2014-05-06 DIAGNOSIS — M129 Arthropathy, unspecified: Secondary | ICD-10-CM | POA: Insufficient documentation

## 2014-05-06 DIAGNOSIS — Z87440 Personal history of urinary (tract) infections: Secondary | ICD-10-CM | POA: Insufficient documentation

## 2014-05-06 DIAGNOSIS — S99929A Unspecified injury of unspecified foot, initial encounter: Secondary | ICD-10-CM | POA: Diagnosis not present

## 2014-05-06 DIAGNOSIS — Z9889 Other specified postprocedural states: Secondary | ICD-10-CM | POA: Insufficient documentation

## 2014-05-06 DIAGNOSIS — S0990XA Unspecified injury of head, initial encounter: Secondary | ICD-10-CM | POA: Diagnosis not present

## 2014-05-06 DIAGNOSIS — Z87891 Personal history of nicotine dependence: Secondary | ICD-10-CM | POA: Insufficient documentation

## 2014-05-06 DIAGNOSIS — S32591A Other specified fracture of right pubis, initial encounter for closed fracture: Secondary | ICD-10-CM

## 2014-05-06 DIAGNOSIS — S8990XA Unspecified injury of unspecified lower leg, initial encounter: Secondary | ICD-10-CM | POA: Insufficient documentation

## 2014-05-06 DIAGNOSIS — Z79899 Other long term (current) drug therapy: Secondary | ICD-10-CM | POA: Insufficient documentation

## 2014-05-06 DIAGNOSIS — F3289 Other specified depressive episodes: Secondary | ICD-10-CM | POA: Insufficient documentation

## 2014-05-06 DIAGNOSIS — J4489 Other specified chronic obstructive pulmonary disease: Secondary | ICD-10-CM | POA: Insufficient documentation

## 2014-05-06 DIAGNOSIS — R296 Repeated falls: Secondary | ICD-10-CM | POA: Insufficient documentation

## 2014-05-06 DIAGNOSIS — J449 Chronic obstructive pulmonary disease, unspecified: Secondary | ICD-10-CM | POA: Insufficient documentation

## 2014-05-06 DIAGNOSIS — M25569 Pain in unspecified knee: Secondary | ICD-10-CM | POA: Diagnosis not present

## 2014-05-06 LAB — BASIC METABOLIC PANEL
Anion gap: 13 (ref 5–15)
BUN: 12 mg/dL (ref 6–23)
CHLORIDE: 99 meq/L (ref 96–112)
CO2: 25 meq/L (ref 19–32)
Calcium: 8.8 mg/dL (ref 8.4–10.5)
Creatinine, Ser: 0.69 mg/dL (ref 0.50–1.10)
GFR calc Af Amer: 89 mL/min — ABNORMAL LOW (ref >90)
GFR calc non Af Amer: 77 mL/min — ABNORMAL LOW (ref >90)
GLUCOSE: 110 mg/dL — AB (ref 70–99)
POTASSIUM: 4.2 meq/L (ref 3.7–5.3)
SODIUM: 137 meq/L (ref 137–147)

## 2014-05-06 LAB — CBC WITH DIFFERENTIAL/PLATELET
Basophils Absolute: 0 10*3/uL (ref 0.0–0.1)
Basophils Relative: 0 % (ref 0–1)
Eosinophils Absolute: 0 10*3/uL (ref 0.0–0.7)
Eosinophils Relative: 0 % (ref 0–5)
HCT: 35.9 % — ABNORMAL LOW (ref 36.0–46.0)
Hemoglobin: 11.5 g/dL — ABNORMAL LOW (ref 12.0–15.0)
LYMPHS ABS: 1.2 10*3/uL (ref 0.7–4.0)
Lymphocytes Relative: 10 % — ABNORMAL LOW (ref 12–46)
MCH: 29.1 pg (ref 26.0–34.0)
MCHC: 32 g/dL (ref 30.0–36.0)
MCV: 90.9 fL (ref 78.0–100.0)
Monocytes Absolute: 0.6 10*3/uL (ref 0.1–1.0)
Monocytes Relative: 5 % (ref 3–12)
Neutro Abs: 10.8 10*3/uL — ABNORMAL HIGH (ref 1.7–7.7)
Neutrophils Relative %: 85 % — ABNORMAL HIGH (ref 43–77)
PLATELETS: 279 10*3/uL (ref 150–400)
RBC: 3.95 MIL/uL (ref 3.87–5.11)
RDW: 13.1 % (ref 11.5–15.5)
WBC: 12.6 10*3/uL — AB (ref 4.0–10.5)

## 2014-05-06 LAB — URINALYSIS, ROUTINE W REFLEX MICROSCOPIC
Bilirubin Urine: NEGATIVE
GLUCOSE, UA: NEGATIVE mg/dL
Hgb urine dipstick: NEGATIVE
Ketones, ur: NEGATIVE mg/dL
LEUKOCYTES UA: NEGATIVE
Nitrite: NEGATIVE
PH: 7 (ref 5.0–8.0)
Protein, ur: NEGATIVE mg/dL
SPECIFIC GRAVITY, URINE: 1.015 (ref 1.005–1.030)
Urobilinogen, UA: 0.2 mg/dL (ref 0.0–1.0)

## 2014-05-06 MED ORDER — ACETAMINOPHEN 325 MG PO TABS
650.0000 mg | ORAL_TABLET | Freq: Once | ORAL | Status: AC
  Administered 2014-05-06: 650 mg via ORAL
  Filled 2014-05-06: qty 2

## 2014-05-06 MED ORDER — SODIUM CHLORIDE 0.9 % IV BOLUS (SEPSIS)
500.0000 mL | Freq: Once | INTRAVENOUS | Status: AC
  Administered 2014-05-06: 500 mL via INTRAVENOUS

## 2014-05-06 MED ORDER — OXYCODONE HCL 5 MG PO TABS: 5.0000 mg | ORAL_TABLET | ORAL | Status: DC | PRN

## 2014-05-06 MED ORDER — FENTANYL CITRATE 0.05 MG/ML IJ SOLN
50.0000 ug | Freq: Once | INTRAMUSCULAR | Status: AC
  Administered 2014-05-06: 50 ug via INTRAVENOUS
  Filled 2014-05-06: qty 2

## 2014-05-06 NOTE — ED Notes (Signed)
Pt was at sink for AM bath, reached for something and lost balance and fell, landing on left hip. Complains of left hippain, per ems rotation noted. Occurred at [facility].

## 2014-05-06 NOTE — ED Notes (Signed)
Returned from radiology, Phlebotomy at bedside.

## 2014-05-06 NOTE — ED Notes (Signed)
SPOKE TO PTR AND THEY ADVISE THEY ARE ENROUTE NOW TO GET PT

## 2014-05-06 NOTE — ED Notes (Signed)
[facility] HAS ARRIVED TO TRANSPORT PT TO NURSING HOME

## 2014-05-06 NOTE — ED Notes (Signed)
Pt taken to radiology

## 2014-05-06 NOTE — ED Provider Notes (Signed)
CSN: [id number]     Arrival date & time 05/06/14  1159 History   First MD Initiated Contact with Patient 05/06/14 1207     Chief Complaint  Patient presents with  . Fall     (Consider location/radiation/quality/duration/timing/severity/associated sxs/prior Treatment) Patient is a 80 y.o. female presenting with fall. The history is provided by the patient.  Fall This is a recurrent problem. Episode onset: 2-5 hrs ago. Episode frequency: once. The problem has been resolved. Pertinent negatives include no chest pain, no abdominal pain, no headaches and no shortness of breath. Nothing aggravates the symptoms. Nothing relieves the symptoms. She has tried nothing for the symptoms. The treatment provided no relief.    Past Medical History  Diagnosis Date  . Chronic obstructive asthma, unspecified   . Allergic rhinitis, cause unspecified   . Angioneurotic edema not elsewhere classified   . Cancer 2006    lung ca  . Malignant neoplasm of bronchus and lung, unspecified site   . Environmental allergies   . Headache(784.0)   . Depression   . Arthritis   . Collapsed lung 1959    hx of with chest tube insertion  . Complication of anesthesia     severe headache x1  . UTI (urinary tract infection)    Past Surgical History  Procedure Laterality Date  . Right upper lobectomy  2006  . Abdominal hysterectomy    . Cholecystectomy  1969  . Closed manipulation shoulder Right 1990's    "frozen shoulder"  . Total knee arthroplasty Left 03/21/2014    Procedure: LEFT TOTAL KNEE ARTHROPLASTY;  Surgeon: Gearlean Alf, MD;  Location: [facility];  Service: Orthopedics;  Laterality: Left;   Family History  Problem Relation Age of Onset  . Asthma    . Alzheimer's disease Mother   . Stroke Mother    History  Substance Use Topics  . Smoking status: Former Smoker -- 1.25 packs/day for 50 years    Types: Cigarettes    Quit date: 11/04/1993  . Smokeless tobacco: Never Used  . Alcohol Use: No   OB  History   Grav Para Term Preterm Abortions TAB SAB Ect Mult Living                 Review of Systems  Constitutional: Negative for fever and fatigue.  HENT: Negative for congestion and drooling.   Eyes: Negative for pain.  Respiratory: Negative for cough and shortness of breath.   Cardiovascular: Negative for chest pain.  Gastrointestinal: Negative for nausea, vomiting, abdominal pain and diarrhea.  Genitourinary: Negative for dysuria and hematuria.  Musculoskeletal: Negative for back pain, gait problem and neck pain.  Skin: Negative for color change.  Neurological: Negative for dizziness and headaches.  Hematological: Negative for adenopathy.  Psychiatric/Behavioral: Negative for behavioral problems.  All other systems reviewed and are negative.     Allergies  Aspirin; Codeine sulfate; Penicillins; and Septra ds  Home Medications   Prior to Admission medications   Medication Sig Start Date End Date Taking? Authorizing Provider  acetaminophen (TYLENOL) 500 MG tablet Take 500-1,000 mg by mouth every 6 (six) hours as needed for mild pain or moderate pain. For pain.    Historical Provider, MD  albuterol (PROVENTIL HFA;VENTOLIN HFA) 108 (90 BASE) MCG/ACT inhaler Inhale 1 puff into the lungs every 6 (six) hours as needed for wheezing or shortness of breath.    Historical Provider, MD  bisacodyl (DULCOLAX) 10 MG suppository Place 1 suppository (10 mg total) rectally daily as  needed for moderate constipation. 03/24/14   Arlee Muslim, PA-C  diazepam (VALIUM) 5 MG tablet Take 1/2 tablet by mouth at bedtime as needed for sleep 03/24/14   Tiffany L Reed, DO  docusate sodium 100 MG CAPS Take 100 mg by mouth 2 (two) times daily. 03/24/14   Arlee Muslim, PA-C  famotidine (PEPCID) 20 MG tablet Take 20 mg by mouth daily.      Historical Provider, MD  Fluticasone-Salmeterol (ADVAIR) 250-50 MCG/DOSE AEPB Inhale 1 puff into the lungs 2 (two) times daily.    Historical Provider, MD  iron  polysaccharides (NIFEREX) 150 MG capsule Take 1 capsule (150 mg total) by mouth daily. 03/24/14   Arlee Muslim, PA-C  loratadine (CLARITIN) 10 MG tablet Take 10 mg by mouth daily.    Historical Provider, MD  loratadine-pseudoephedrine (CLARITIN-D 24-HOUR) 10-240 MG per 24 hr tablet Take 1 tablet by mouth daily.      Historical Provider, MD  methocarbamol (ROBAXIN) 500 MG tablet Take 1 tablet (500 mg total) by mouth every 6 (six) hours as needed for muscle spasms. 03/24/14   Arlee Muslim, PA-C  metoCLOPramide (REGLAN) 5 MG tablet Take 1 tablet (5 mg total) by mouth every 8 (eight) hours as needed for nausea (if ondansetron (ZOFRAN) ineffective.). 03/24/14   Arlee Muslim, PA-C  metoprolol (TOPROL-XL) 50 MG 24 hr tablet Take 50 mg by mouth every morning.     Historical Provider, MD  mometasone (NASONEX) 50 MCG/ACT nasal spray Place 2 sprays into the nose daily.    Historical Provider, MD  NASONEX 50 MCG/ACT nasal spray USE 1 TO 2 SPRAYS IN EACH  NOSTRIL DAILY    Clinton D Young, MD  ondansetron (ZOFRAN) 4 MG tablet Take 1 tablet (4 mg total) by mouth every 6 (six) hours as needed for nausea. 03/24/14   Arlee Muslim, PA-C  oxyCODONE (OXY IR/ROXICODONE) 5 MG immediate release tablet Take 1-2 tablets (5-10 mg total) by mouth every 3 (three) hours as needed for moderate pain, severe pain or breakthrough pain. 03/24/14   Arlee Muslim, PA-C  polyethylene glycol [employer] [facility] / GLYCOLAX) packet Take 17 g by mouth daily as needed for mild constipation or moderate constipation. 03/24/14   Arlee Muslim, PA-C  sertraline (ZOLOFT) 25 MG tablet Take 25 mg by mouth every evening.  01/03/13   Historical Provider, MD   BP 156/61  Pulse 70  Temp(Src) 97.8 F (36.6 C) (Oral)  Resp 18  Ht 5\' 2"  (1.575 m)  Wt 158 lb 8 oz (71.895 kg)  BMI 28.98 kg/m2  SpO2 95% Physical Exam  Nursing note and vitals reviewed. Constitutional: She is oriented to person, place, and time. She appears well-developed and well-nourished.  HENT:  Head:  Normocephalic and atraumatic.  Mouth/Throat: Oropharynx is clear and moist. No oropharyngeal exudate.  Eyes: Conjunctivae and EOM are normal. Pupils are equal, round, and reactive to light.  Neck: Normal range of motion. Neck supple.  No cervical tenderness to palpation.  Cardiovascular: Normal rate, regular rhythm, normal heart sounds and intact distal pulses.  Exam reveals no gallop and no friction rub.   No murmur heard. Pulmonary/Chest: Effort normal and breath sounds normal. No respiratory distress. She has no wheezes.  Abdominal: Soft. Bowel sounds are normal. There is no tenderness. There is no rebound and no guarding.  Musculoskeletal: Normal range of motion. She exhibits no edema and no tenderness.  Tenderness to palpation of the right lateral hip.  Diffuse mild tenderness to the left knee with limited range of  motion.  2+ distal pulses in the lower extremities.  Neurological: She is alert and oriented to person, place, and time. She has normal strength. No sensory deficit.  Skin: Skin is warm and dry.  Psychiatric: She has a normal mood and affect. Her behavior is normal.    ED Course  Procedures (including critical care time) Labs Review Labs Reviewed  CBC WITH DIFFERENTIAL - Abnormal; Notable for the following:    WBC 12.6 (*)    Hemoglobin 11.5 (*)    HCT 35.9 (*)    Neutrophils Relative % 85 (*)    Neutro Abs 10.8 (*)    Lymphocytes Relative 10 (*)    All other components within normal limits  BASIC METABOLIC PANEL - Abnormal; Notable for the following:    Glucose, Bld 110 (*)    GFR calc non Af Amer 77 (*)    GFR calc Af Amer 89 (*)    All other components within normal limits  URINALYSIS, ROUTINE W REFLEX MICROSCOPIC    Imaging Review Dg Chest 1 View  05/06/2014   CLINICAL DATA:  [place].  EXAM: CHEST - 1 VIEW  COMPARISON:  03/10/2014.  FINDINGS: Interval borderline enlargement of the cardiac silhouette. Clear lungs with the exception of stable mild linear  scarring at both lung bases. Surgical staple line in the right lung and right mediastinal and hilar surgical clips. Old, healed left rib fractures. No acute fracture or pneumothorax seen. Stable bullous changes at the medial right lung base.  IMPRESSION: 1. No acute abnormality. 2. Interval borderline cardiomegaly. 3. Stable chronic changes, as described above.   Electronically Signed   By: Enrique Sack M.D.   On: 05/06/2014 13:17   Dg Hip Complete Right  05/06/2014   CLINICAL DATA:  Fall  EXAM: RIGHT HIP - COMPLETE 2+ VIEW  COMPARISON:  None.  FINDINGS: The right hip is limited in evaluation secondary to rotation. There is no evidence of hip fracture or dislocation. There is a nondisplaced fracture of the left superior and inferior pubic rami. There is no evidence of arthropathy or other focal bone abnormality.  Posterior lumbar fusion at L4-5.  IMPRESSION: Nondisplaced fractures of the right superior and inferior pubic rami.   Electronically Signed   By: Kathreen Devoid   On: 05/06/2014 13:15   Dg Knee 2 Views Left  05/06/2014   CLINICAL DATA:  Total knee arthroplasty 10 weeks ago. Fell today. Pain.  EXAM: LEFT KNEE - 1-2 VIEW  COMPARISON:  None.  FINDINGS: Total knee arthroplasty has a good appearance. No evidence of fracture or other discernible complication. There may be a small amount of joint fluid.  IMPRESSION: Total knee arthroplasty. No acute or complicating feature. Possible small amount of joint fluid.   Electronically Signed   By: Nelson Chimes M.D.   On: 05/06/2014 14:51   Dg Knee 1-2 Views Right  05/06/2014   CLINICAL DATA:  [place].  EXAM: RIGHT KNEE - 1-2 VIEW  COMPARISON:  01/30/2010.  FINDINGS: Minimal spur formation involving all 3 joint compartments. Diffuse osteopenia. No fracture, dislocation or effusion seen.  IMPRESSION: 1. No fracture or dislocation. 2. Minimal degenerative changes. 3. Diffuse osteopenia.   Electronically Signed   By: Enrique Sack M.D.   On: 05/06/2014 13:42   Ct Head Wo  Contrast  05/06/2014   CLINICAL DATA:  Recent traumatic injury and pain  EXAM: CT HEAD WITHOUT CONTRAST  TECHNIQUE: Contiguous axial images were obtained from the base of the skull through  the vertex without intravenous contrast.  COMPARISON:  11/14/2010  FINDINGS: The bony calvarium is intact. Mild atrophic changes are seen areas chronic white matter ischemic change are noted as well diffuse scattered lacunar infarct within the head of the caudate nucleus on the left as well as the deep white matter on right.  IMPRESSION: Chronic changes without acute abnormality.   Electronically Signed   By: Inez Catalina M.D.   On: 05/06/2014 13:23     EKG Interpretation   Date/Time:  Friday May 06 2014 12:38:27 EDT Ventricular Rate:  82 PR Interval:  140 QRS Duration: 80 QT Interval:  365 QTC Calculation: 426 R Axis:   65 Text Interpretation:  Sinus rhythm Ventricular premature complex No  significant change since last tracing Confirmed by Sundi Slevin  MD, Andyn Sales  ([id number]) on 05/06/2014 1:33:33 PM      MDM   Final diagnoses:  Fracture of multiple pubic rami, right, closed, initial encounter  Fall, initial encounter    12:30 PM 80 y.o. female with a history of COPD, lung cancer, s/p recent left knee surg who presents with a fall which occurred earlier this morning while she was getting ready. She states that she was putting her close on when she became dizzy and fell onto her right side.She states she has a hx of brief episodes of dizziness. She denies any dizziness currently.  She denies hitting her head or loss of consciousness. She currently complains of right hip pain. She is afebrile and vital signs are unremarkable here. Will get screening labs and imaging.  4:23 PM: Discussed w/ Dr. Alvan Dame (on call) who recommends WBAT and f/u w/ Dr. Maureen Ralphs in a few weeks. The pt is already on oxycodone, will provide a new Rx. She is also on a bowel regimen. I have discussed the diagnosis/risks/treatment options with  the patient and family and believe the pt to be eligible for discharge home to follow-up with Dr. Maureen Ralphs. We also discussed returning to the ED immediately if new or worsening sx occur. We discussed the sx which are most concerning (e.g., worsening pain) that necessitate immediate return. Medications administered to the patient during their visit and any new prescriptions provided to the patient are listed below.  Medications given during this visit Medications  fentaNYL (SUBLIMAZE) injection 50 mcg (50 mcg Intravenous Given 05/06/14 1234)  sodium chloride 0.9 % bolus 500 mL (500 mLs Intravenous New Bag/Given 05/06/14 1234)  acetaminophen (TYLENOL) tablet 650 mg (650 mg Oral Given 05/06/14 1235)  fentaNYL (SUBLIMAZE) injection 50 mcg (50 mcg Intravenous Given 05/06/14 1343)    Discharge Medication List as of 05/06/2014  4:27 PM    START taking these medications   Details  oxyCODONE (ROXICODONE) 5 MG immediate release tablet Take 1 tablet (5 mg total) by mouth every 4 (four) hours as needed for moderate pain or severe pain., Starting 05/06/2014, Until Discontinued, Print         Blanchard Kelch, MD 05/07/14 1116

## 2014-05-09 ENCOUNTER — Non-Acute Institutional Stay (SKILLED_NURSING_FACILITY): Payer: Medicare Other | Admitting: Internal Medicine

## 2014-05-09 ENCOUNTER — Encounter: Payer: Self-pay | Admitting: Internal Medicine

## 2014-05-09 DIAGNOSIS — S32509A Unspecified fracture of unspecified pubis, initial encounter for closed fracture: Secondary | ICD-10-CM | POA: Diagnosis not present

## 2014-05-09 DIAGNOSIS — E871 Hypo-osmolality and hyponatremia: Secondary | ICD-10-CM | POA: Diagnosis not present

## 2014-05-09 DIAGNOSIS — Z96659 Presence of unspecified artificial knee joint: Secondary | ICD-10-CM | POA: Diagnosis not present

## 2014-05-09 DIAGNOSIS — D62 Acute posthemorrhagic anemia: Secondary | ICD-10-CM

## 2014-05-09 DIAGNOSIS — S32599A Other specified fracture of unspecified pubis, initial encounter for closed fracture: Secondary | ICD-10-CM | POA: Insufficient documentation

## 2014-05-09 DIAGNOSIS — Z96652 Presence of left artificial knee joint: Secondary | ICD-10-CM

## 2014-05-09 NOTE — Assessment & Plan Note (Signed)
Na+ 137 on 7/3

## 2014-05-09 NOTE — Progress Notes (Signed)
MRN: [id number] Name: [name] [name]  Sex: female Age: 80 y.o. DOB: 05-30-1927  [place] #:  Facility/Room: Level Of Care: SNF Provider: Inocencio Homes D Emergency Contacts: Extended Emergency Contact Information Primary Emergency Contact: Mandrell,Lynn Address: [address]          [place], [place] [address] [place] Phone: [phone] Relation: Relative  Code Status:   Allergies: Aspirin; Codeine sulfate; Penicillins; and Septra ds  Chief Complaint  Patient presents with  . Acute Visit    HPI: Patient is 80 y.o. female who   Past Medical History  Diagnosis Date  . Chronic obstructive asthma, unspecified   . Allergic rhinitis, cause unspecified   . Angioneurotic edema not elsewhere classified   . Cancer 2006    lung ca  . Malignant neoplasm of bronchus and lung, unspecified site   . Environmental allergies   . Headache(784.0)   . Depression   . Arthritis   . Collapsed lung 1959    hx of with chest tube insertion  . Complication of anesthesia     severe headache x1  . UTI (urinary tract infection)     Past Surgical History  Procedure Laterality Date  . Right upper lobectomy  2006  . Abdominal hysterectomy    . Cholecystectomy  1969  . Closed manipulation shoulder Right 1990's    "frozen shoulder"  . Total knee arthroplasty Left 03/21/2014    Procedure: LEFT TOTAL KNEE ARTHROPLASTY;  Surgeon: Gearlean Alf, MD;  Location: [facility];  Service: Orthopedics;  Laterality: Left;      Medication List       This list is accurate as of: 05/09/14  5:16 PM.  Always use your most recent med list.               albuterol 108 (90 BASE) MCG/ACT inhaler  Commonly known as:  PROVENTIL HFA;VENTOLIN HFA  Inhale 1 puff into the lungs every 6 (six) hours as needed for wheezing or shortness of breath.     aspirin EC 81 MG tablet  Take 81 mg by mouth daily.     diazepam 5 MG tablet  Commonly known as:  VALIUM  Take 2.5 mg by mouth at bedtime as  needed (for sleep).     docusate sodium 100 MG capsule  Commonly known as:  COLACE  Take 100 mg by mouth daily.     famotidine 20 MG tablet  Commonly known as:  PEPCID  Take 20 mg by mouth daily.     Fluticasone-Salmeterol 250-50 MCG/DOSE Aepb  Commonly known as:  ADVAIR  Inhale 1 puff into the lungs 2 (two) times daily.     iron polysaccharides 150 MG capsule  Commonly known as:  NIFEREX  Take 1 capsule (150 mg total) by mouth daily.     loratadine 10 MG tablet  Commonly known as:  CLARITIN  Take 10 mg by mouth daily.     methocarbamol 500 MG tablet  Commonly known as:  ROBAXIN  Take 500 mg by mouth every 6 (six) hours.     metoCLOPramide 5 MG tablet  Commonly known as:  REGLAN  Take 1 tablet (5 mg total) by mouth every 8 (eight) hours as needed for nausea (if ondansetron (ZOFRAN) ineffective.).     metoprolol succinate 50 MG 24 hr tablet  Commonly known as:  TOPROL-XL  Take 50 mg by mouth every morning.     mometasone 50 MCG/ACT nasal spray  Commonly known as:  NASONEX  Place 2 sprays into the nose daily.     ondansetron 4 MG tablet  Commonly known as:  ZOFRAN  Take 1 tablet (4 mg total) by mouth every 6 (six) hours as needed for nausea.     oxycodone 5 MG capsule  Commonly known as:  OXY-IR  Take 5 mg by mouth every 4 (four) hours as needed for pain.     oxyCODONE 5 MG immediate release tablet  Commonly known as:  ROXICODONE  Take 1 tablet (5 mg total) by mouth every 4 (four) hours as needed for moderate pain or severe pain.     polyethylene glycol packet  Commonly known as:  MIRALAX / GLYCOLAX  Take 17 g by mouth daily as needed for mild constipation or moderate constipation.     sertraline 25 MG tablet  Commonly known as:  ZOLOFT  Take 25 mg by mouth every evening.        No orders of the defined types were placed in this encounter.    Immunization History  Administered Date(s) Administered  . H1N1 10/25/2008  . Influenza Split 07/25/2011,  07/14/2012  . Influenza Whole 08/02/2009, 07/16/2010  . Influenza,inj,Quad PF,36+ Mos 07/19/2013  . Pneumococcal Polysaccharide-23 08/07/2001    History  Substance Use Topics  . Smoking status: Former Smoker -- 1.25 packs/day for 50 years    Types: Cigarettes    Quit date: 11/04/1993  . Smokeless tobacco: Never Used  . Alcohol Use: No    Review of Systems  DATA OBTAINED: from patient, nurse, medical record, family member GENERAL: Feels well no fevers, fatigue, appetite changes SKIN: No itching, rash HEENT: No complaint RESPIRATORY: No cough, wheezing, SOB CARDIAC: No chest pain, palpitations, lower extremity edema  GI: No abdominal pain, No N/V/D or constipation, No heartburn or reflux  GU: No dysuria, frequency or urgency, or incontinence  MUSCULOSKELETAL: No unrelieved bone/joint pain NEUROLOGIC: No headache, dizziness or focal weakness PSYCHIATRIC: No overt anxiety or sadness. Sleeps well.   Filed Vitals:   05/09/14 1706  BP: 170/86  Pulse: 89  Temp: 98.2 F (36.8 C)  Resp: 20    Physical Exam  GENERAL APPEARANCE: Alert, conversant. Appropriately groomed. No acute distress  SKIN: No diaphoresis rash, or wounds HEENT: Unremarkable RESPIRATORY: Breathing is even, unlabored. Lung sounds are clear   CARDIOVASCULAR: Heart RRR no murmurs, rubs or gallops. No peripheral edema  GASTROINTESTINAL: Abdomen is soft, non-tender, not distended w/ normal bowel sounds.  GENITOURINARY: Bladder non tender, not distended  MUSCULOSKELETAL: No abnormal joints or musculature NEUROLOGIC: Cranial nerves 2-12 grossly intact. Moves all extremities no tremor. PSYCHIATRIC: Mood and affect appropriate to situation, no behavioral issues  Patient Active Problem List   Diagnosis Date Noted  . Closed stable fracture of multiple pubic rami 05/09/2014  . Unspecified constipation 04/29/2014  . S/P total knee arthroplasty 04/10/2014  . Postoperative anemia due to acute blood loss 03/22/2014  .  Hyponatremia 03/22/2014  . OA (osteoarthritis) of knee 03/21/2014  . CHRONIC ETHMOIDAL SINUSITIS 01/15/2011  . Allergic rhinitis due to pollen 12/31/2010  . NECK PAIN 11/13/2010  . NEOP, MALIGNANT, BRONCHUS/LUNG NOS 08/19/2007  . ASTHMA, CHRONIC OBSTRUCTIVE NOS 08/19/2007  . ANGIOEDEMA 08/19/2007    CBC    Component Value Date/Time   WBC 12.6* 05/06/2014 1345   WBC 10.8* 04/30/2013 1106   RBC 3.95 05/06/2014 1345   RBC 4.28 04/30/2013 1106   HGB 11.5* 05/06/2014 1345   HGB 13.0 04/30/2013 1106   HCT 35.9* 05/06/2014 1345  HCT 39.0 04/30/2013 1106   PLT 279 05/06/2014 1345   PLT 290 04/30/2013 1106   MCV 90.9 05/06/2014 1345   MCV 91.1 04/30/2013 1106   LYMPHSABS 1.2 05/06/2014 1345   LYMPHSABS 1.9 04/30/2013 1106   MONOABS 0.6 05/06/2014 1345   MONOABS 0.8 04/30/2013 1106   EOSABS 0.0 05/06/2014 1345   EOSABS 0.1 04/30/2013 1106   BASOSABS 0.0 05/06/2014 1345   BASOSABS 0.1 04/30/2013 1106    CMP     Component Value Date/Time   NA 137 05/06/2014 1345   NA 136 04/30/2013 1106   NA 138 04/24/2012 1112   K 4.2 05/06/2014 1345   K 5.0 04/30/2013 1106   K 4.5 04/24/2012 1112   CL 99 05/06/2014 1345   CL 94* 04/24/2012 1112   CO2 25 05/06/2014 1345   CO2 29 04/30/2013 1106   CO2 30 04/24/2012 1112   GLUCOSE 110* 05/06/2014 1345   GLUCOSE 94 04/30/2013 1106   GLUCOSE 94 04/24/2012 1112   BUN 12 05/06/2014 1345   BUN 19.4 04/30/2013 1106   BUN 22 04/24/2012 1112   CREATININE 0.69 05/06/2014 1345   CREATININE 1.1 04/30/2013 1106   CREATININE 0.7 04/24/2012 1112   CALCIUM 8.8 05/06/2014 1345   CALCIUM 10.2 04/30/2013 1106   CALCIUM 9.5 04/24/2012 1112   PROT 7.7 03/10/2014 1230   PROT 7.1 04/30/2013 1106   PROT 7.3 04/24/2012 1112   ALBUMIN 4.7 03/10/2014 1230   ALBUMIN 4.1 04/30/2013 1106   AST 22 03/10/2014 1230   AST 20 04/30/2013 1106   AST 24 04/24/2012 1112   ALT 16 03/10/2014 1230   ALT 20 04/30/2013 1106   ALT 20 04/24/2012 1112   ALKPHOS 65 03/10/2014 1230   ALKPHOS 60 04/30/2013 1106   ALKPHOS 62 04/24/2012 1112    BILITOT 0.7 03/10/2014 1230   BILITOT 0.93 04/30/2013 1106   BILITOT 1.00 04/24/2012 1112   GFRNONAA 77* 05/06/2014 1345   GFRAA 89* 05/06/2014 1345    Assessment and Plan  Closed stable fracture of multiple pubic rami From fall in SNF   Hennie Duos, MD

## 2014-05-09 NOTE — Assessment & Plan Note (Addendum)
From fall in SNF 2/2 dizzyness which she has had before. Pt fell onto R side and fx inf and sup R pubic rami. Styudies on L knee, neck and head were neg. Pt may use percocet for pain and WBAT per ortho consult at the ED.

## 2014-05-09 NOTE — Assessment & Plan Note (Signed)
Hb hAS IMPROVED FROM 8.8 a month ago to 11.5

## 2014-05-09 NOTE — Assessment & Plan Note (Signed)
Doing well 

## 2014-05-30 ENCOUNTER — Other Ambulatory Visit: Payer: Self-pay | Admitting: *Deleted

## 2014-05-30 MED ORDER — OXYCODONE HCL 5 MG PO TABS: ORAL_TABLET | ORAL | Status: DC

## 2014-05-30 NOTE — Telephone Encounter (Signed)
[place]

## 2014-05-31 DIAGNOSIS — F329 Major depressive disorder, single episode, unspecified: Secondary | ICD-10-CM | POA: Diagnosis not present

## 2014-05-31 DIAGNOSIS — F3289 Other specified depressive episodes: Secondary | ICD-10-CM | POA: Diagnosis not present

## 2014-05-31 DIAGNOSIS — F411 Generalized anxiety disorder: Secondary | ICD-10-CM | POA: Diagnosis not present

## 2014-06-07 DIAGNOSIS — Z471 Aftercare following joint replacement surgery: Secondary | ICD-10-CM | POA: Diagnosis not present

## 2014-06-10 ENCOUNTER — Inpatient Hospital Stay (HOSPITAL_COMMUNITY)
Admission: EM | Admit: 2014-06-10 | Discharge: 2014-06-14 | DRG: 470 | Disposition: A | Discharge status: Skilled Nursing Facility | Payer: Medicare Other | Attending: Internal Medicine | Admitting: Internal Medicine

## 2014-06-10 ENCOUNTER — Emergency Department (HOSPITAL_COMMUNITY): Payer: Medicare Other

## 2014-06-10 ENCOUNTER — Encounter (HOSPITAL_COMMUNITY): Payer: Self-pay | Admitting: Emergency Medicine

## 2014-06-10 DIAGNOSIS — Z96641 Presence of right artificial hip joint: Secondary | ICD-10-CM

## 2014-06-10 DIAGNOSIS — Z881 Allergy status to other antibiotic agents status: Secondary | ICD-10-CM

## 2014-06-10 DIAGNOSIS — F329 Major depressive disorder, single episode, unspecified: Secondary | ICD-10-CM | POA: Diagnosis present

## 2014-06-10 DIAGNOSIS — K5900 Constipation, unspecified: Secondary | ICD-10-CM

## 2014-06-10 DIAGNOSIS — Z7982 Long term (current) use of aspirin: Secondary | ICD-10-CM

## 2014-06-10 DIAGNOSIS — X58XXXA Exposure to other specified factors, initial encounter: Secondary | ICD-10-CM

## 2014-06-10 DIAGNOSIS — Z85118 Personal history of other malignant neoplasm of bronchus and lung: Secondary | ICD-10-CM

## 2014-06-10 DIAGNOSIS — M129 Arthropathy, unspecified: Secondary | ICD-10-CM | POA: Diagnosis not present

## 2014-06-10 DIAGNOSIS — Z886 Allergy status to analgesic agent status: Secondary | ICD-10-CM | POA: Diagnosis not present

## 2014-06-10 DIAGNOSIS — S99919A Unspecified injury of unspecified ankle, initial encounter: Secondary | ICD-10-CM | POA: Diagnosis not present

## 2014-06-10 DIAGNOSIS — T783XXA Angioneurotic edema, initial encounter: Secondary | ICD-10-CM

## 2014-06-10 DIAGNOSIS — S32509A Unspecified fracture of unspecified pubis, initial encounter for closed fracture: Secondary | ICD-10-CM

## 2014-06-10 DIAGNOSIS — J449 Chronic obstructive pulmonary disease, unspecified: Secondary | ICD-10-CM | POA: Diagnosis not present

## 2014-06-10 DIAGNOSIS — J42 Unspecified chronic bronchitis: Secondary | ICD-10-CM | POA: Diagnosis not present

## 2014-06-10 DIAGNOSIS — Z96659 Presence of unspecified artificial knee joint: Secondary | ICD-10-CM

## 2014-06-10 DIAGNOSIS — F3289 Other specified depressive episodes: Secondary | ICD-10-CM | POA: Diagnosis present

## 2014-06-10 DIAGNOSIS — S72009A Fracture of unspecified part of neck of unspecified femur, initial encounter for closed fracture: Principal | ICD-10-CM

## 2014-06-10 DIAGNOSIS — Z79899 Other long term (current) drug therapy: Secondary | ICD-10-CM | POA: Diagnosis not present

## 2014-06-10 DIAGNOSIS — R110 Nausea: Secondary | ICD-10-CM | POA: Diagnosis not present

## 2014-06-10 DIAGNOSIS — Z885 Allergy status to narcotic agent status: Secondary | ICD-10-CM | POA: Diagnosis not present

## 2014-06-10 DIAGNOSIS — M25559 Pain in unspecified hip: Secondary | ICD-10-CM | POA: Diagnosis not present

## 2014-06-10 DIAGNOSIS — F0390 Unspecified dementia without behavioral disturbance: Secondary | ICD-10-CM | POA: Diagnosis not present

## 2014-06-10 DIAGNOSIS — Z87891 Personal history of nicotine dependence: Secondary | ICD-10-CM | POA: Diagnosis not present

## 2014-06-10 DIAGNOSIS — Z823 Family history of stroke: Secondary | ICD-10-CM

## 2014-06-10 DIAGNOSIS — J322 Chronic ethmoidal sinusitis: Secondary | ICD-10-CM

## 2014-06-10 DIAGNOSIS — S32599A Other specified fracture of unspecified pubis, initial encounter for closed fracture: Secondary | ICD-10-CM

## 2014-06-10 DIAGNOSIS — S8990XA Unspecified injury of unspecified lower leg, initial encounter: Secondary | ICD-10-CM | POA: Diagnosis not present

## 2014-06-10 DIAGNOSIS — J4489 Other specified chronic obstructive pulmonary disease: Secondary | ICD-10-CM | POA: Diagnosis not present

## 2014-06-10 DIAGNOSIS — Z820 Family history of epilepsy and other diseases of the nervous system: Secondary | ICD-10-CM | POA: Diagnosis not present

## 2014-06-10 DIAGNOSIS — S72023A Displaced fracture of epiphysis (separation) (upper) of unspecified femur, initial encounter for closed fracture: Secondary | ICD-10-CM | POA: Diagnosis not present

## 2014-06-10 DIAGNOSIS — R0602 Shortness of breath: Secondary | ICD-10-CM | POA: Diagnosis not present

## 2014-06-10 DIAGNOSIS — D62 Acute posthemorrhagic anemia: Secondary | ICD-10-CM

## 2014-06-10 DIAGNOSIS — J309 Allergic rhinitis, unspecified: Secondary | ICD-10-CM | POA: Diagnosis not present

## 2014-06-10 DIAGNOSIS — Z825 Family history of asthma and other chronic lower respiratory diseases: Secondary | ICD-10-CM

## 2014-06-10 DIAGNOSIS — S72001A Fracture of unspecified part of neck of right femur, initial encounter for closed fracture: Secondary | ICD-10-CM

## 2014-06-10 DIAGNOSIS — R9431 Abnormal electrocardiogram [ECG] [EKG]: Secondary | ICD-10-CM | POA: Diagnosis not present

## 2014-06-10 DIAGNOSIS — Z880 Allergy status to penicillin: Secondary | ICD-10-CM | POA: Diagnosis not present

## 2014-06-10 DIAGNOSIS — Z5189 Encounter for other specified aftercare: Secondary | ICD-10-CM | POA: Diagnosis not present

## 2014-06-10 DIAGNOSIS — G4700 Insomnia, unspecified: Secondary | ICD-10-CM | POA: Diagnosis not present

## 2014-06-10 DIAGNOSIS — E871 Hypo-osmolality and hyponatremia: Secondary | ICD-10-CM

## 2014-06-10 DIAGNOSIS — T148XXA Other injury of unspecified body region, initial encounter: Secondary | ICD-10-CM | POA: Diagnosis not present

## 2014-06-10 DIAGNOSIS — J301 Allergic rhinitis due to pollen: Secondary | ICD-10-CM

## 2014-06-10 DIAGNOSIS — M542 Cervicalgia: Secondary | ICD-10-CM

## 2014-06-10 DIAGNOSIS — C3490 Malignant neoplasm of unspecified part of unspecified bronchus or lung: Secondary | ICD-10-CM

## 2014-06-10 DIAGNOSIS — R52 Pain, unspecified: Secondary | ICD-10-CM | POA: Diagnosis not present

## 2014-06-10 DIAGNOSIS — M25569 Pain in unspecified knee: Secondary | ICD-10-CM | POA: Diagnosis not present

## 2014-06-10 LAB — BASIC METABOLIC PANEL
Anion gap: 12 (ref 5–15)
BUN: 14 mg/dL (ref 6–23)
CO2: 27 mEq/L (ref 19–32)
Calcium: 9.1 mg/dL (ref 8.4–10.5)
Chloride: 99 mEq/L (ref 96–112)
Creatinine, Ser: 0.65 mg/dL (ref 0.50–1.10)
GFR, EST NON AFRICAN AMERICAN: 79 mL/min — AB (ref >90)
Glucose, Bld: 113 mg/dL — ABNORMAL HIGH (ref 70–99)
Potassium: 4.5 mEq/L (ref 3.7–5.3)
Sodium: 138 mEq/L (ref 137–147)

## 2014-06-10 LAB — URINE MICROSCOPIC-ADD ON

## 2014-06-10 LAB — CBC WITH DIFFERENTIAL/PLATELET
BASOS ABS: 0 10*3/uL (ref 0.0–0.1)
Basophils Relative: 1 % (ref 0–1)
Eosinophils Absolute: 0 10*3/uL (ref 0.0–0.7)
Eosinophils Relative: 0 % (ref 0–5)
HCT: 34.4 % — ABNORMAL LOW (ref 36.0–46.0)
Hemoglobin: 10.9 g/dL — ABNORMAL LOW (ref 12.0–15.0)
Lymphocytes Relative: 19 % (ref 12–46)
Lymphs Abs: 1.7 10*3/uL (ref 0.7–4.0)
MCH: 26.7 pg (ref 26.0–34.0)
MCHC: 31.7 g/dL (ref 30.0–36.0)
MCV: 84.1 fL (ref 78.0–100.0)
Monocytes Absolute: 0.4 10*3/uL (ref 0.1–1.0)
Monocytes Relative: 5 % (ref 3–12)
NEUTROS PCT: 75 % (ref 43–77)
Neutro Abs: 6.6 10*3/uL (ref 1.7–7.7)
PLATELETS: 365 10*3/uL (ref 150–400)
RBC: 4.09 MIL/uL (ref 3.87–5.11)
RDW: 14.5 % (ref 11.5–15.5)
WBC: 8.8 10*3/uL (ref 4.0–10.5)

## 2014-06-10 LAB — URINALYSIS, ROUTINE W REFLEX MICROSCOPIC
Bilirubin Urine: NEGATIVE
GLUCOSE, UA: NEGATIVE mg/dL
Hgb urine dipstick: NEGATIVE
KETONES UR: NEGATIVE mg/dL
Nitrite: NEGATIVE
Protein, ur: NEGATIVE mg/dL
Specific Gravity, Urine: 1.018 (ref 1.005–1.030)
Urobilinogen, UA: 0.2 mg/dL (ref 0.0–1.0)
pH: 6 (ref 5.0–8.0)

## 2014-06-10 LAB — ABO/RH: ABO/RH(D): O NEG

## 2014-06-10 LAB — TYPE AND SCREEN
ABO/RH(D): O NEG
Antibody Screen: NEGATIVE

## 2014-06-10 LAB — PROTIME-INR
INR: 0.93 (ref 0.00–1.49)
Prothrombin Time: 12.5 seconds (ref 11.6–15.2)

## 2014-06-10 MED ORDER — DIAZEPAM 5 MG PO TABS
2.5000 mg | ORAL_TABLET | Freq: Every evening | ORAL | Status: DC | PRN
  Administered 2014-06-10 – 2014-06-12 (×2): 2.5 mg via ORAL
  Filled 2014-06-10 (×2): qty 1

## 2014-06-10 MED ORDER — FLUTICASONE PROPIONATE 50 MCG/ACT NA SUSP
1.0000 | Freq: Every day | NASAL | Status: DC
  Administered 2014-06-10 – 2014-06-14 (×4): 1 via NASAL
  Filled 2014-06-10 (×2): qty 16

## 2014-06-10 MED ORDER — LORATADINE 10 MG PO TABS
10.0000 mg | ORAL_TABLET | Freq: Every day | ORAL | Status: DC
  Administered 2014-06-11 – 2014-06-14 (×4): 10 mg via ORAL
  Filled 2014-06-10 (×4): qty 1

## 2014-06-10 MED ORDER — FENTANYL CITRATE 0.05 MG/ML IJ SOLN
50.0000 ug | INTRAMUSCULAR | Status: DC | PRN
  Administered 2014-06-10 (×2): 50 ug via INTRAVENOUS
  Filled 2014-06-10 (×2): qty 2

## 2014-06-10 MED ORDER — ASPIRIN EC 81 MG PO TBEC
81.0000 mg | DELAYED_RELEASE_TABLET | Freq: Every day | ORAL | Status: DC
  Filled 2014-06-10: qty 1

## 2014-06-10 MED ORDER — SERTRALINE HCL 50 MG PO TABS
50.0000 mg | ORAL_TABLET | Freq: Every day | ORAL | Status: DC
  Administered 2014-06-10 – 2014-06-14 (×5): 50 mg via ORAL
  Filled 2014-06-10 (×5): qty 1

## 2014-06-10 MED ORDER — ONDANSETRON HCL 4 MG/2ML IJ SOLN
4.0000 mg | Freq: Four times a day (QID) | INTRAMUSCULAR | Status: DC | PRN
  Administered 2014-06-11: 4 mg via INTRAVENOUS
  Filled 2014-06-10: qty 2

## 2014-06-10 MED ORDER — OXYCODONE HCL 5 MG PO TABS
5.0000 mg | ORAL_TABLET | ORAL | Status: DC | PRN
  Administered 2014-06-10 – 2014-06-11 (×4): 5 mg via ORAL
  Filled 2014-06-10 (×5): qty 1

## 2014-06-10 MED ORDER — ENOXAPARIN SODIUM 40 MG/0.4ML ~~LOC~~ SOLN
40.0000 mg | Freq: Once | SUBCUTANEOUS | Status: AC
  Administered 2014-06-10: 40 mg via SUBCUTANEOUS
  Filled 2014-06-10 (×2): qty 0.4

## 2014-06-10 MED ORDER — FAMOTIDINE 20 MG PO TABS
20.0000 mg | ORAL_TABLET | Freq: Every day | ORAL | Status: DC
  Administered 2014-06-10 – 2014-06-14 (×5): 20 mg via ORAL
  Filled 2014-06-10 (×5): qty 1

## 2014-06-10 MED ORDER — POLYETHYLENE GLYCOL 3350 17 G PO PACK: 17.0000 g | PACK | Freq: Every day | ORAL | Status: DC | PRN

## 2014-06-10 MED ORDER — CHLORHEXIDINE GLUCONATE 4 % EX LIQD
60.0000 mL | Freq: Once | CUTANEOUS | Status: AC
  Administered 2014-06-11: 4 via TOPICAL
  Filled 2014-06-10 (×2): qty 60

## 2014-06-10 MED ORDER — HYDROMORPHONE HCL PF 1 MG/ML IJ SOLN
0.5000 mg | Freq: Once | INTRAMUSCULAR | Status: AC
  Administered 2014-06-10: 0.5 mg via INTRAVENOUS
  Filled 2014-06-10: qty 1

## 2014-06-10 MED ORDER — LEVALBUTEROL HCL 0.63 MG/3ML IN NEBU
0.6300 mg | INHALATION_SOLUTION | Freq: Four times a day (QID) | RESPIRATORY_TRACT | Status: DC | PRN
  Filled 2014-06-10: qty 3

## 2014-06-10 MED ORDER — SODIUM CHLORIDE 0.9 % IV SOLN
1000.0000 mL | INTRAVENOUS | Status: DC
  Administered 2014-06-10: 1000 mL via INTRAVENOUS

## 2014-06-10 MED ORDER — CEFAZOLIN SODIUM-DEXTROSE 2-3 GM-% IV SOLR
2.0000 g | INTRAVENOUS | Status: AC
  Administered 2014-06-11: 2 g via INTRAVENOUS
  Filled 2014-06-10: qty 50

## 2014-06-10 MED ORDER — METOPROLOL SUCCINATE ER 50 MG PO TB24
50.0000 mg | ORAL_TABLET | Freq: Every morning | ORAL | Status: DC
  Administered 2014-06-11 – 2014-06-14 (×3): 50 mg via ORAL
  Filled 2014-06-10 (×4): qty 1

## 2014-06-10 MED ORDER — DOCUSATE SODIUM 100 MG PO CAPS
100.0000 mg | ORAL_CAPSULE | Freq: Every day | ORAL | Status: DC
  Administered 2014-06-10 – 2014-06-14 (×5): 100 mg via ORAL
  Filled 2014-06-10: qty 1

## 2014-06-10 MED ORDER — MORPHINE SULFATE 2 MG/ML IJ SOLN
0.5000 mg | INTRAMUSCULAR | Status: DC | PRN
  Administered 2014-06-10 – 2014-06-11 (×5): 0.5 mg via INTRAVENOUS
  Filled 2014-06-10 (×5): qty 1

## 2014-06-10 MED ORDER — BISACODYL 10 MG RE SUPP: 10.0000 mg | Freq: Every day | RECTAL | Status: DC | PRN

## 2014-06-10 MED ORDER — MOMETASONE FURO-FORMOTEROL FUM 100-5 MCG/ACT IN AERO
2.0000 | INHALATION_SPRAY | Freq: Two times a day (BID) | RESPIRATORY_TRACT | Status: DC
  Administered 2014-06-10 – 2014-06-14 (×8): 2 via RESPIRATORY_TRACT
  Filled 2014-06-10 (×2): qty 8.8

## 2014-06-10 MED ORDER — METOCLOPRAMIDE HCL 10 MG PO TABS: 5.0000 mg | ORAL_TABLET | Freq: Three times a day (TID) | ORAL | Status: DC | PRN

## 2014-06-10 MED ORDER — ASPIRIN EC 81 MG PO TBEC
81.0000 mg | DELAYED_RELEASE_TABLET | Freq: Every day | ORAL | Status: DC
  Administered 2014-06-12 – 2014-06-14 (×3): 81 mg via ORAL
  Filled 2014-06-10 (×3): qty 1

## 2014-06-10 MED ORDER — ONDANSETRON HCL 4 MG/2ML IJ SOLN: 4.0000 mg | Freq: Three times a day (TID) | INTRAMUSCULAR | Status: DC | PRN

## 2014-06-10 MED ORDER — METHOCARBAMOL 500 MG PO TABS
500.0000 mg | ORAL_TABLET | Freq: Four times a day (QID) | ORAL | Status: DC
  Administered 2014-06-10 – 2014-06-14 (×14): 500 mg via ORAL
  Filled 2014-06-10 (×26): qty 1

## 2014-06-10 MED ORDER — POTASSIUM CHLORIDE IN NACL 20-0.45 MEQ/L-% IV SOLN
INTRAVENOUS | Status: DC
  Administered 2014-06-10: 12:00:00 via INTRAVENOUS
  Filled 2014-06-10 (×3): qty 1000

## 2014-06-10 MED ORDER — SODIUM CHLORIDE 0.9 % IV SOLN
INTRAVENOUS | Status: DC
  Administered 2014-06-10: 19:00:00 via INTRAVENOUS

## 2014-06-10 MED ORDER — HYDROCODONE-ACETAMINOPHEN 5-325 MG PO TABS
1.0000 | ORAL_TABLET | ORAL | Status: DC | PRN
  Administered 2014-06-10: 1 via ORAL
  Filled 2014-06-10: qty 2

## 2014-06-10 MED ORDER — ALBUTEROL SULFATE (2.5 MG/3ML) 0.083% IN NEBU: 3.0000 mL | INHALATION_SOLUTION | Freq: Four times a day (QID) | RESPIRATORY_TRACT | Status: DC | PRN

## 2014-06-10 NOTE — Consult Note (Signed)
Reason for Consult:right hip fracture Referring Physician: EDP/Hospitalist  HPI: [name] [name] is an 80 y.o. female was standing and twisted and right hip gave way, with severe pain and inability to ambulate. She was an inpatient recovering from a recent pubic rami fracture and is also well known to our service having undergone a L TKA by Dr. Wynelle Link approximately 6 months ago.  Past Medical History  Diagnosis Date  . Chronic obstructive asthma, unspecified   . Allergic rhinitis, cause unspecified   . Angioneurotic edema not elsewhere classified   . Cancer 2006    lung ca  . Malignant neoplasm of bronchus and lung, unspecified site   . Environmental allergies   . Headache(784.0)   . Depression   . Arthritis   . Collapsed lung 1959    hx of with chest tube insertion  . Complication of anesthesia     severe headache x1  . UTI (urinary tract infection)     Past Surgical History  Procedure Laterality Date  . Right upper lobectomy  2006  . Abdominal hysterectomy    . Cholecystectomy  1969  . Closed manipulation shoulder Right 1990's    "frozen shoulder"  . Total knee arthroplasty Left 03/21/2014    Procedure: LEFT TOTAL KNEE ARTHROPLASTY;  Surgeon: Gearlean Alf, MD;  Location: [facility];  Service: Orthopedics;  Laterality: Left;    Family History  Problem Relation Age of Onset  . Asthma    . Alzheimer's disease Mother   . Stroke Mother     Social History:  reports that she quit smoking about 20 years ago. Her smoking use included Cigarettes. She has a 62.5 pack-year smoking history. She has never used smokeless tobacco. She reports that she does not drink alcohol or use illicit drugs.  Allergies:  Allergies  Allergen Reactions  . Aspirin     REACTION: GI upset in large amounts  . Codeine Sulfate     REACTION: GI upset  . Penicillins Rash  . Septra Ds [Sulfamethoxazole-Trimethoprim] Rash    Medications: as in chart  Results for orders placed during the hospital  encounter of 06/10/14 (from the past 48 hour(s))  BASIC METABOLIC PANEL     Status: Abnormal   Collection Time    06/10/14  7:05 AM      Result Value Ref Range   Sodium 138  137 - 147 mEq/L   Potassium 4.5  3.7 - 5.3 mEq/L   Chloride 99  96 - 112 mEq/L   CO2 27  19 - 32 mEq/L   Glucose, Bld 113 (*) 70 - 99 mg/dL   BUN 14  6 - 23 mg/dL   Creatinine, Ser 0.65  0.50 - 1.10 mg/dL   Calcium 9.1  8.4 - 10.5 mg/dL   GFR calc non Af Amer 79 (*) >90 mL/min   GFR calc Af Amer >90  >90 mL/min   Comment: (NOTE)     The eGFR has been calculated using the CKD EPI equation.     This calculation has not been validated in all clinical situations.     eGFR's persistently <90 mL/min signify possible Chronic Kidney     Disease.   Anion gap 12  5 - 15  CBC WITH DIFFERENTIAL     Status: Abnormal   Collection Time    06/10/14  7:05 AM      Result Value Ref Range   WBC 8.8  4.0 - 10.5 K/uL   RBC 4.09  3.87 -  5.11 MIL/uL   Hemoglobin 10.9 (*) 12.0 - 15.0 g/dL   HCT 34.4 (*) 36.0 - 46.0 %   MCV 84.1  78.0 - 100.0 fL   MCH 26.7  26.0 - 34.0 pg   MCHC 31.7  30.0 - 36.0 g/dL   RDW 14.5  11.5 - 15.5 %   Platelets 365  150 - 400 K/uL   Neutrophils Relative % 75  43 - 77 %   Neutro Abs 6.6  1.7 - 7.7 K/uL   Lymphocytes Relative 19  12 - 46 %   Lymphs Abs 1.7  0.7 - 4.0 K/uL   Monocytes Relative 5  3 - 12 %   Monocytes Absolute 0.4  0.1 - 1.0 K/uL   Eosinophils Relative 0  0 - 5 %   Eosinophils Absolute 0.0  0.0 - 0.7 K/uL   Basophils Relative 1  0 - 1 %   Basophils Absolute 0.0  0.0 - 0.1 K/uL  PROTIME-INR     Status: None   Collection Time    06/10/14  7:05 AM      Result Value Ref Range   Prothrombin Time 12.5  11.6 - 15.2 seconds   INR 0.93  0.00 - 1.49  TYPE AND SCREEN     Status: None   Collection Time    06/10/14  7:05 AM      Result Value Ref Range   ABO/RH(D) O NEG     Antibody Screen NEG     Sample Expiration 06/13/2014      Dg Chest 1 View  06/10/2014   CLINICAL DATA:  Right hip  pain  EXAM: CHEST - 1 VIEW  COMPARISON:  None.  FINDINGS: Normal cardiac silhouette. Lungs are hyperinflated. Chronic bronchitic markings noted. No pulmonary edema. No infiltrate.  IMPRESSION: 1.  No acute cardiopulmonary process. 2. Hyperinflated lungs with chronic bronchitic markings.   Electronically Signed   By: Suzy Bouchard M.D.   On: 06/10/2014 07:30   Dg Hip Complete Right  06/10/2014   CLINICAL DATA:  Right hip pain.  EXAM: RIGHT HIP - COMPLETE 2+ VIEW  COMPARISON:  05/06/2014  FINDINGS: There is a new, mildly displaced right femoral neck fracture with mild varus angulation. There is no dislocation. Nondisplaced right superior and inferior pubic rami fractures are again noted with some softening of the fracture lines and evidence of early callus formation. Lower lumbar fusion is noted.  IMPRESSION: 1. New right femoral neck fracture. 2. Healing, nondisplaced right superior and inferior pubic rami fractures.   Electronically Signed   By: Logan Bores   On: 06/10/2014 07:35    ROS: Pt denies any recent change to her usual medical status  Physical Exam: alert and oriented answers questions appropriately Holds right leg shortened and externally rotated NVI to RLE    Vitals Temp:  [98.3 F (36.8 C)] 98.3 F (36.8 C) (08/07 0644) Pulse Rate:  [72] 72 (08/07 0644) Resp:  [18] 18 (08/07 0644) BP: (147)/(67) 147/67 mmHg (08/07 0644) SpO2:  [95 %] 95 % (08/07 0644) There is no weight on file to calculate BMI.  Assessment/Plan: Impression: Right displaced femoral neck fracture Treatment: will provide orders and make NPO for planned Right hemiarthroplasty tomorrow by Dr. Wynelle Link. Can have regular diet today. Will also await eval by medicine service.  [facility] for Dr. Justice Britain 06/10/2014, 9:03 AM

## 2014-06-10 NOTE — Progress Notes (Signed)
This RN called the ED for report at 1005. RN unavailable at this moment. Gave callback number.

## 2014-06-10 NOTE — ED Notes (Signed)
Updated pt.s daughter in law, Fernande Boyden, pre request by pt. On pt.s plan of care.

## 2014-06-10 NOTE — Progress Notes (Signed)
Utilization review completed.  

## 2014-06-10 NOTE — ED Notes (Signed)
Transported to xray 

## 2014-06-10 NOTE — ED Notes (Signed)
Pt to ED from [facility] for possible fracture hip.  Pt reports getting up to go the bathroom, went to get back in wheelchair, and felt pain in R hip. Recent hip fracture to R side which is why patient is at [facility]. Denies falling. EMS gave 168mcg Fentanyl enroute. PT A&O x 4

## 2014-06-10 NOTE — H&P (Signed)
[facility] History and Physical  [name] [name] AST:[id number] DOB: December 29, 1926 DOA: 06/10/2014  Referring physician: EDP PCP: Wenda Low, MD   Chief Complaint: Right hip pain  HPI: [name] [name] is a 80 y.o. female medical history significant for pelvic fracture, status post left total knee arthroplasty in May of 2015 who was residing at [facility] and presents with above complaints. She reports that early this a.m. she got up to go to the bathroom and when she turned around to get back into the wheelchair "something happened", and when she  sat down in the wheelchair that she began having severe pain in her right hip. She denies any falls. She was seen in the ED and x-ray revealed no right femoral neck fracture, and the healing nondisplaced right superior and inferior pubic rami fractures were noted. Orthopedics was consulted per EDP and she is admitted to [facility] for further evaluation and management.    Review of Systems The patient denies anorexia, fever, weight loss, vision loss, decreased hearing, hoarseness, chest pain, syncope, dyspnea on exertion, peripheral edema, balance deficits, hemoptysis, abdominal pain, melena, hematochezia, severe indigestion/heartburn, hematuria, suspicious skin lesions, transient blindness, depression, unusual weight change, abnormal bleeding, enlarged lymph nodes, angioedema, and breast masses.   Past Medical History  Diagnosis Date  . Chronic obstructive asthma, unspecified   . Allergic rhinitis, cause unspecified   . Angioneurotic edema not elsewhere classified   . Cancer 2006    lung ca  . Malignant neoplasm of bronchus and lung, unspecified site   . Environmental allergies   . Headache(784.0)   . Depression   . Arthritis   . Collapsed lung 1959    hx of with chest tube insertion  . Complication of anesthesia     severe headache x1  . UTI (urinary tract infection)    Past Surgical History  Procedure Laterality Date  . Right upper  lobectomy  2006  . Abdominal hysterectomy    . Cholecystectomy  1969  . Closed manipulation shoulder Right 1990's    "frozen shoulder"  . Total knee arthroplasty Left 03/21/2014    Procedure: LEFT TOTAL KNEE ARTHROPLASTY;  Surgeon: Gearlean Alf, MD;  Location: [facility];  Service: Orthopedics;  Laterality: Left;   Social History:  reports that she quit smoking about 20 years ago. Her smoking use included Cigarettes. She has a 62.5 pack-year smoking history. She has never used smokeless tobacco. She reports that she does not drink alcohol or use illicit drugs.  Allergies  Allergen Reactions  . Aspirin     REACTION: GI upset in large amounts  . Codeine Sulfate     REACTION: GI upset  . Penicillins Rash  . Septra Ds [Sulfamethoxazole-Trimethoprim] Rash    Family History  Problem Relation Age of Onset  . Asthma    . Alzheimer's disease Mother   . Stroke Mother      Prior to Admission medications   Medication Sig Start Date End Date Taking? Authorizing Provider  albuterol (PROVENTIL HFA;VENTOLIN HFA) 108 (90 BASE) MCG/ACT inhaler Inhale 1 puff into the lungs every 6 (six) hours as needed for wheezing or shortness of breath.   Yes Historical Provider, MD  aspirin EC 81 MG tablet Take 81 mg by mouth daily.   Yes Historical Provider, MD  diazepam (VALIUM) 5 MG tablet Take 2.5 mg by mouth at bedtime as needed (for sleep).   Yes Historical Provider, MD  docusate sodium (COLACE) 100 MG capsule Take 100 mg by mouth daily.  Yes Historical Provider, MD  famotidine (PEPCID) 20 MG tablet Take 20 mg by mouth daily.    Yes Historical Provider, MD  Fluticasone-Salmeterol (ADVAIR) 250-50 MCG/DOSE AEPB Inhale 1 puff into the lungs 2 (two) times daily.   Yes Historical Provider, MD  iron polysaccharides (NIFEREX) 150 MG capsule Take 1 capsule (150 mg total) by mouth daily. 03/24/14  Yes Arlee Muslim, PA-C  loratadine (CLARITIN) 10 MG tablet Take 10 mg by mouth daily.   Yes Historical Provider, MD   methocarbamol (ROBAXIN) 500 MG tablet Take 500 mg by mouth every 6 (six) hours.   Yes Historical Provider, MD  metoCLOPramide (REGLAN) 5 MG tablet Take 1 tablet (5 mg total) by mouth every 8 (eight) hours as needed for nausea (if ondansetron (ZOFRAN) ineffective.). 03/24/14  Yes Arlee Muslim, PA-C  metoprolol (TOPROL-XL) 50 MG 24 hr tablet Take 50 mg by mouth every morning.    Yes Historical Provider, MD  mometasone (NASONEX) 50 MCG/ACT nasal spray Place 2 sprays into the nose daily.   Yes Historical Provider, MD  ondansetron (ZOFRAN) 4 MG tablet Take 1 tablet (4 mg total) by mouth every 6 (six) hours as needed for nausea. 03/24/14  Yes Arlee Muslim, PA-C  oxyCODONE (ROXICODONE) 5 MG immediate release tablet Take one tablet by mouth every four hours as needed for moderate to severe pain 05/30/14  Yes Tiffany L Reed, DO  polyethylene glycol (MIRALAX / GLYCOLAX) packet Take 17 g by mouth daily as needed for mild constipation or moderate constipation. 03/24/14  Yes Arlee Muslim, PA-C  sertraline (ZOLOFT) 50 MG tablet Take 50 mg by mouth daily.   Yes Historical Provider, MD   Physical Exam: Filed Vitals:   06/10/14 1130  BP: 145/72  Pulse: 72  Temp: 97.5 F (36.4 C)  Resp: 20    BP 145/72  Pulse 72  Temp(Src) 97.5 F (36.4 C) (Oral)  Resp 20  SpO2 95% Constitutional: Vital signs reviewed.  Patient is a well-developed and well-nourished  in no acute distress and cooperative with exam. Alert and oriented x3.  Head: Normocephalic and atraumatic Ear: TM normal bilaterally Nose: No erythema or drainage noted.  Turbinates normal Mouth: no erythema or exudates, MMM Eyes: PERRL, EOMI, conjunctivae normal, No scleral icterus.  Neck: Supple, Trachea midline normal ROM, No JVD, mass, thyromegaly, or carotid bruit present.  Cardiovascular: RRR, S1 normal, S2 normal, no MRG, pulses symmetric and intact bilaterally Pulmonary/Chest: normal respiratory effort, CTAB, no wheezes, rales, or  rhonchi Abdominal: Soft. Non-tender, non-distended, bowel sounds are normal, no masses, organomegaly, or guarding present.  GU: no CVA tenderness extremities: No cyanosis and no edema  Neurological: A&O x3,cranial nerve II-XII are grossly intact, no focal motor deficit, sensory grossly intact  Skin: Warm, dry and intact. No rash, cyanosis, or clubbing.  Psychiatric: Normal mood and affect. speech and behavior is normal.               Labs on Admission:  Basic Metabolic Panel:  Recent Labs Lab 06/10/14 0705  NA 138  K 4.5  CL 99  CO2 27  GLUCOSE 113*  BUN 14  CREATININE 0.65  CALCIUM 9.1   Liver Function Tests: No results found for this basename: AST, ALT, ALKPHOS, BILITOT, PROT, ALBUMIN,  in the last 168 hours No results found for this basename: LIPASE, AMYLASE,  in the last 168 hours No results found for this basename: AMMONIA,  in the last 168 hours CBC:  Recent Labs Lab 06/10/14 0705  WBC 8.8  NEUTROABS 6.6  HGB 10.9*  HCT 34.4*  MCV 84.1  PLT 365   Cardiac Enzymes: No results found for this basename: CKTOTAL, CKMB, CKMBINDEX, TROPONINI,  in the last 168 hours  BNP (last 3 results) No results found for this basename: PROBNP,  in the last 8760 hours CBG: No results found for this basename: GLUCAP,  in the last 168 hours  Radiological Exams on Admission: Dg Chest 1 View  06/10/2014   CLINICAL DATA:  Right hip pain  EXAM: CHEST - 1 VIEW  COMPARISON:  None.  FINDINGS: Normal cardiac silhouette. Lungs are hyperinflated. Chronic bronchitic markings noted. No pulmonary edema. No infiltrate.  IMPRESSION: 1.  No acute cardiopulmonary process. 2. Hyperinflated lungs with chronic bronchitic markings.   Electronically Signed   By: Suzy Bouchard M.D.   On: 06/10/2014 07:30   Dg Hip Complete Right  06/10/2014   CLINICAL DATA:  Right hip pain.  EXAM: RIGHT HIP - COMPLETE 2+ VIEW  COMPARISON:  05/06/2014  FINDINGS: There is a new, mildly displaced right femoral neck  fracture with mild varus angulation. There is no dislocation. Nondisplaced right superior and inferior pubic rami fractures are again noted with some softening of the fracture lines and evidence of early callus formation. Lower lumbar fusion is noted.  IMPRESSION: 1. New right femoral neck fracture. 2. Healing, nondisplaced right superior and inferior pubic rami fractures.   Electronically Signed   By: Logan Bores   On: 06/10/2014 07:35    EKG: Independently reviewed. Normal sinus rhythm and 71, no acute ischemic changes  Assessment/Plan Present on Admission:  . Right hip/femoral neck fracture  -As discussed above, orthopedics consulted in EDP  -analgesics for pain management -she is chest pain free, EKG with no ischemic findings, and oxygenating well on room air, CXR with no acute infiltrates. Will optimize her pulmonary status perioperatively by continuing her outpt bronchodilators and adding prn nebs -Her surgical risk is age related, from medical standpoint OK to proceed with her needed surgery  . Closed stable fracture of multiple pubic rami -follow and continue PT . ASTHMA, CHRONIC OBSTRUCTIVE NOS -Stable, continue outpt meds and prn nebs .S/P Total knee arthroplasty -about months ago(03/2014) per Dr. Maureen Ralphs -follow and continue PT . H/O Lung cancer    Code Status: Full Family Communication: None at bedside Disposition Plan: Admit to [facility]  Time spent: Greater than 30 minutes  [facility] Pager [phone]

## 2014-06-10 NOTE — ED Provider Notes (Signed)
CSN: [id number]     Arrival date & time 06/10/14  3762 History   First MD Initiated Contact with Patient 06/10/14 [phone]     Chief Complaint  Patient presents with  . Hip Injury     (Consider location/radiation/quality/duration/timing/severity/associated sxs/prior Treatment) HPI 80yo at Rehab for pelvic Fx standing turned and felt severe constant localized right hip pain worse with movement and palpation, no fall, no distal weak/numb  Tender right hip; right foot DP intact, normal LT, wiggles toes; C-S NT  [facility] will consult in ED; [facility] paged for admit. 0800 D/w [facility]. 77  Past Medical History  Diagnosis Date  . Chronic obstructive asthma, unspecified   . Allergic rhinitis, cause unspecified   . Angioneurotic edema not elsewhere classified   . Cancer 2006    lung ca  . Malignant neoplasm of bronchus and lung, unspecified site   . Environmental allergies   . Headache(784.0)   . Depression   . Arthritis   . Collapsed lung 1959    hx of with chest tube insertion  . Complication of anesthesia     severe headache x1  . UTI (urinary tract infection)    Past Surgical History  Procedure Laterality Date  . Right upper lobectomy  2006  . Abdominal hysterectomy    . Cholecystectomy  1969  . Closed manipulation shoulder Right 1990's    "frozen shoulder"  . Total knee arthroplasty Left 03/21/2014    Procedure: LEFT TOTAL KNEE ARTHROPLASTY;  Surgeon: Gearlean Alf, MD;  Location: [facility];  Service: Orthopedics;  Laterality: Left;   Family History  Problem Relation Age of Onset  . Asthma    . Alzheimer's disease Mother   . Stroke Mother    History  Substance Use Topics  . Smoking status: Former Smoker -- 1.25 packs/day for 50 years    Types: Cigarettes    Quit date: 11/04/1993  . Smokeless tobacco: Never Used  . Alcohol Use: No   OB History   Grav Para Term Preterm Abortions TAB SAB Ect Mult Living                 Review of Systems 10 Systems reviewed and are  negative for acute change except as noted in the HPI.   Allergies  Aspirin; Codeine sulfate; Penicillins; and Septra ds  Home Medications   Prior to Admission medications   Medication Sig Start Date End Date Taking? Authorizing Provider  albuterol (PROVENTIL HFA;VENTOLIN HFA) 108 (90 BASE) MCG/ACT inhaler Inhale 1 puff into the lungs every 6 (six) hours as needed for wheezing or shortness of breath.   Yes Historical Provider, MD  aspirin EC 81 MG tablet Take 81 mg by mouth daily.   Yes Historical Provider, MD  diazepam (VALIUM) 5 MG tablet Take 2.5 mg by mouth at bedtime as needed (for sleep).   Yes Historical Provider, MD  docusate sodium (COLACE) 100 MG capsule Take 100 mg by mouth daily.   Yes Historical Provider, MD  famotidine (PEPCID) 20 MG tablet Take 20 mg by mouth daily.    Yes Historical Provider, MD  Fluticasone-Salmeterol (ADVAIR) 250-50 MCG/DOSE AEPB Inhale 1 puff into the lungs 2 (two) times daily.   Yes Historical Provider, MD  iron polysaccharides (NIFEREX) 150 MG capsule Take 1 capsule (150 mg total) by mouth daily. 03/24/14  Yes Arlee Muslim, PA-C  loratadine (CLARITIN) 10 MG tablet Take 10 mg by mouth daily.   Yes Historical Provider, MD  methocarbamol (ROBAXIN) 500 MG  tablet Take 500 mg by mouth every 6 (six) hours.   Yes Historical Provider, MD  metoCLOPramide (REGLAN) 5 MG tablet Take 1 tablet (5 mg total) by mouth every 8 (eight) hours as needed for nausea (if ondansetron (ZOFRAN) ineffective.). 03/24/14  Yes Arlee Muslim, PA-C  metoprolol (TOPROL-XL) 50 MG 24 hr tablet Take 50 mg by mouth every morning.    Yes Historical Provider, MD  mometasone (NASONEX) 50 MCG/ACT nasal spray Place 2 sprays into the nose daily.   Yes Historical Provider, MD  ondansetron (ZOFRAN) 4 MG tablet Take 1 tablet (4 mg total) by mouth every 6 (six) hours as needed for nausea. 03/24/14  Yes Arlee Muslim, PA-C  oxyCODONE (ROXICODONE) 5 MG immediate release tablet Take one tablet by mouth every four  hours as needed for moderate to severe pain 05/30/14  Yes Tiffany L Reed, DO  polyethylene glycol (MIRALAX / GLYCOLAX) packet Take 17 g by mouth daily as needed for mild constipation or moderate constipation. 03/24/14  Yes Arlee Muslim, PA-C  sertraline (ZOLOFT) 50 MG tablet Take 50 mg by mouth daily.   Yes Historical Provider, MD   BP 150/51  Pulse 77  Temp(Src) 99.3 F (37.4 C) (Oral)  Resp 18  SpO2 95% Physical Exam  Nursing note and vitals reviewed. Constitutional:  Awake, alert, nontoxic appearance.  HENT:  Head: Atraumatic.  Eyes: Right eye exhibits no discharge. Left eye exhibits no discharge.  Neck: Neck supple.  C-S NT  Cardiovascular: Normal rate and regular rhythm.   No murmur heard. Pulmonary/Chest: Effort normal and breath sounds normal. No respiratory distress. She has no wheezes. She has no rales. She exhibits no tenderness.  Abdominal: Soft. Bowel sounds are normal. She exhibits no distension. There is no tenderness. There is no rebound and no guarding.  Musculoskeletal: She exhibits tenderness.  Baseline ROM, no obvious new focal weakness.Arms and left leg NT. Right hip tender; right knee ankle foot all NT. Right foot DP intact with normal LT and CR<2secs toes and wiggles toes.  Neurological: She is alert.  Mental status and motor strength appears baseline for patient and situation.  Skin: No rash noted.  Psychiatric: She has a normal mood and affect.    ED Course  Procedures (including critical care time) Patient / Family / Caregiver informed of clinical course, understand medical decision-making process, and agree with plan. Labs Review Labs Reviewed  BASIC METABOLIC PANEL - Abnormal; Notable for the following:    Glucose, Bld 113 (*)    GFR calc non Af Amer 79 (*)    All other components within normal limits  CBC WITH DIFFERENTIAL - Abnormal; Notable for the following:    Hemoglobin 10.9 (*)    HCT 34.4 (*)    All other components within normal limits   URINALYSIS, ROUTINE W REFLEX MICROSCOPIC - Abnormal; Notable for the following:    Leukocytes, UA SMALL (*)    All other components within normal limits  CBC - Abnormal; Notable for the following:    Hemoglobin 11.0 (*)    HCT 34.4 (*)    All other components within normal limits  BASIC METABOLIC PANEL - Abnormal; Notable for the following:    Glucose, Bld 107 (*)    GFR calc non Af Amer 78 (*)    All other components within normal limits  SURGICAL PCR SCREEN  PROTIME-INR  URINE MICROSCOPIC-ADD ON  TYPE AND SCREEN  ABO/RH    Imaging Review Dg Chest 1 View  06/10/2014  CLINICAL DATA:  Right hip pain  EXAM: CHEST - 1 VIEW  COMPARISON:  None.  FINDINGS: Normal cardiac silhouette. Lungs are hyperinflated. Chronic bronchitic markings noted. No pulmonary edema. No infiltrate.  IMPRESSION: 1.  No acute cardiopulmonary process. 2. Hyperinflated lungs with chronic bronchitic markings.   Electronically Signed   By: Suzy Bouchard M.D.   On: 06/10/2014 07:30   Dg Hip Complete Right  06/10/2014   CLINICAL DATA:  Right hip pain.  EXAM: RIGHT HIP - COMPLETE 2+ VIEW  COMPARISON:  05/06/2014  FINDINGS: There is a new, mildly displaced right femoral neck fracture with mild varus angulation. There is no dislocation. Nondisplaced right superior and inferior pubic rami fractures are again noted with some softening of the fracture lines and evidence of early callus formation. Lower lumbar fusion is noted.  IMPRESSION: 1. New right femoral neck fracture. 2. Healing, nondisplaced right superior and inferior pubic rami fractures.   Electronically Signed   By: Logan Bores   On: 06/10/2014 07:35     EKG Interpretation   Date/Time:  Friday June 10 2014 06:44:01 EDT Ventricular Rate:  71 PR Interval:  138 QRS Duration: 85 QT Interval:  399 QTC Calculation: 434 R Axis:   45 Text Interpretation:  Sinus rhythm Minimal ST depression, inferior leads  No significant change since last tracing Confirmed by  [facility]  MD, Jenny Reichmann  [id number]) on 06/10/2014 7:25:51 AM      MDM   Final diagnoses:  Hip fracture, right, closed, initial encounter    The patient appears reasonably stabilized for admission considering the current resources, flow, and capabilities available in the ED at this time, and I doubt any other [facility] requiring further screening and/or treatment in the ED prior to admission.    Babette Relic, MD 06/11/14 [phone]

## 2014-06-11 ENCOUNTER — Encounter (HOSPITAL_COMMUNITY): Payer: Self-pay | Admitting: Certified Registered Nurse Anesthetist

## 2014-06-11 ENCOUNTER — Encounter (HOSPITAL_COMMUNITY)
Admission: EM | Disposition: A | Discharge status: Skilled Nursing Facility | Payer: Self-pay | Source: Home / Self Care | Attending: Internal Medicine

## 2014-06-11 ENCOUNTER — Inpatient Hospital Stay (HOSPITAL_COMMUNITY): Payer: Medicare Other

## 2014-06-11 ENCOUNTER — Encounter (HOSPITAL_COMMUNITY): Payer: Medicare Other | Admitting: Certified Registered Nurse Anesthetist

## 2014-06-11 ENCOUNTER — Inpatient Hospital Stay (HOSPITAL_COMMUNITY): Payer: Medicare Other | Admitting: Certified Registered Nurse Anesthetist

## 2014-06-11 HISTORY — PX: HIP ARTHROPLASTY: SHX981

## 2014-06-11 LAB — CBC
HCT: 34.4 % — ABNORMAL LOW (ref 36.0–46.0)
Hemoglobin: 11 g/dL — ABNORMAL LOW (ref 12.0–15.0)
MCH: 27 pg (ref 26.0–34.0)
MCHC: 32 g/dL (ref 30.0–36.0)
MCV: 84.3 fL (ref 78.0–100.0)
PLATELETS: 336 10*3/uL (ref 150–400)
RBC: 4.08 MIL/uL (ref 3.87–5.11)
RDW: 14.7 % (ref 11.5–15.5)
WBC: 7.3 10*3/uL (ref 4.0–10.5)

## 2014-06-11 LAB — BASIC METABOLIC PANEL
ANION GAP: 11 (ref 5–15)
BUN: 8 mg/dL (ref 6–23)
CHLORIDE: 101 meq/L (ref 96–112)
CO2: 27 meq/L (ref 19–32)
Calcium: 8.5 mg/dL (ref 8.4–10.5)
Creatinine, Ser: 0.66 mg/dL (ref 0.50–1.10)
GFR calc Af Amer: >90 mL/min (ref >90)
GFR calc non Af Amer: 78 mL/min — ABNORMAL LOW (ref >90)
Glucose, Bld: 107 mg/dL — ABNORMAL HIGH (ref 70–99)
Potassium: 4.3 mEq/L (ref 3.7–5.3)
Sodium: 139 mEq/L (ref 137–147)

## 2014-06-11 LAB — SURGICAL PCR SCREEN
MRSA, PCR: NEGATIVE
STAPHYLOCOCCUS AUREUS: NEGATIVE

## 2014-06-11 SURGERY — HEMIARTHROPLASTY, HIP, DIRECT ANTERIOR APPROACH, FOR FRACTURE
Anesthesia: General | Site: Hip | Laterality: Right

## 2014-06-11 SURGERY — HEMIARTHROPLASTY, HIP, DIRECT ANTERIOR APPROACH, FOR FRACTURE
Anesthesia: Choice | Laterality: Right

## 2014-06-11 MED ORDER — ROCURONIUM BROMIDE 100 MG/10ML IV SOLN
INTRAVENOUS | Status: DC | PRN
  Administered 2014-06-11: 20 mg via INTRAVENOUS

## 2014-06-11 MED ORDER — LIDOCAINE HCL (CARDIAC) 20 MG/ML IV SOLN
INTRAVENOUS | Status: DC | PRN
  Administered 2014-06-11: 100 mg via INTRAVENOUS

## 2014-06-11 MED ORDER — POLYETHYLENE GLYCOL 3350 17 G PO PACK: 17.0000 g | PACK | Freq: Every day | ORAL | Status: DC | PRN

## 2014-06-11 MED ORDER — SODIUM CHLORIDE 0.9 % IR SOLN
Status: DC | PRN
  Administered 2014-06-11: 1000 mL

## 2014-06-11 MED ORDER — BISACODYL 10 MG RE SUPP: 10.0000 mg | Freq: Every day | RECTAL | Status: DC | PRN

## 2014-06-11 MED ORDER — STERILE WATER FOR IRRIGATION IR SOLN
Status: DC | PRN
  Administered 2014-06-11: 1500 mL

## 2014-06-11 MED ORDER — SODIUM CHLORIDE 0.9 % IV SOLN
INTRAVENOUS | Status: DC
  Administered 2014-06-11: 75 mL/h via INTRAVENOUS

## 2014-06-11 MED ORDER — PROPOFOL 10 MG/ML IV BOLUS
INTRAVENOUS | Status: DC | PRN
  Administered 2014-06-11: 120 mg via INTRAVENOUS

## 2014-06-11 MED ORDER — SODIUM CHLORIDE 0.9 % IJ SOLN
INTRAMUSCULAR | Status: AC
  Filled 2014-06-11: qty 50

## 2014-06-11 MED ORDER — GLYCOPYRROLATE 0.2 MG/ML IJ SOLN
INTRAMUSCULAR | Status: DC | PRN
  Administered 2014-06-11: 0.6 mg via INTRAVENOUS

## 2014-06-11 MED ORDER — METOCLOPRAMIDE HCL 5 MG/ML IJ SOLN: 5.0000 mg | Freq: Three times a day (TID) | INTRAMUSCULAR | Status: DC | PRN

## 2014-06-11 MED ORDER — OXYCODONE HCL 5 MG PO TABS
5.0000 mg | ORAL_TABLET | ORAL | Status: DC | PRN
  Administered 2014-06-12 (×2): 5 mg via ORAL
  Filled 2014-06-11 (×2): qty 1

## 2014-06-11 MED ORDER — SODIUM CHLORIDE 0.9 % IJ SOLN
INTRAMUSCULAR | Status: AC
  Filled 2014-06-11: qty 10

## 2014-06-11 MED ORDER — FENTANYL CITRATE 0.05 MG/ML IJ SOLN
INTRAMUSCULAR | Status: DC | PRN
  Administered 2014-06-11 (×4): 25 ug via INTRAVENOUS

## 2014-06-11 MED ORDER — LIDOCAINE HCL (CARDIAC) 20 MG/ML IV SOLN
INTRAVENOUS | Status: AC
  Filled 2014-06-11: qty 5

## 2014-06-11 MED ORDER — GLYCOPYRROLATE 0.2 MG/ML IJ SOLN
INTRAMUSCULAR | Status: AC
  Filled 2014-06-11: qty 3

## 2014-06-11 MED ORDER — ONDANSETRON HCL 4 MG/2ML IJ SOLN
4.0000 mg | Freq: Four times a day (QID) | INTRAMUSCULAR | Status: DC | PRN
  Administered 2014-06-11: 4 mg via INTRAVENOUS

## 2014-06-11 MED ORDER — 0.9 % SODIUM CHLORIDE (POUR BTL) OPTIME
TOPICAL | Status: DC | PRN
  Administered 2014-06-11: 1000 mL

## 2014-06-11 MED ORDER — EPHEDRINE SULFATE 50 MG/ML IJ SOLN
INTRAMUSCULAR | Status: AC
  Filled 2014-06-11: qty 1

## 2014-06-11 MED ORDER — NEOSTIGMINE METHYLSULFATE 10 MG/10ML IV SOLN
INTRAVENOUS | Status: AC
  Filled 2014-06-11: qty 1

## 2014-06-11 MED ORDER — ONDANSETRON HCL 4 MG/2ML IJ SOLN
INTRAMUSCULAR | Status: AC
  Filled 2014-06-11: qty 2

## 2014-06-11 MED ORDER — ACETAMINOPHEN 10 MG/ML IV SOLN
1000.0000 mg | Freq: Once | INTRAVENOUS | Status: AC
  Administered 2014-06-11: 1000 mg via INTRAVENOUS
  Filled 2014-06-11: qty 100

## 2014-06-11 MED ORDER — ACETAMINOPHEN 325 MG PO TABS
650.0000 mg | ORAL_TABLET | Freq: Four times a day (QID) | ORAL | Status: DC | PRN
  Administered 2014-06-12 – 2014-06-13 (×3): 650 mg via ORAL
  Filled 2014-06-11 (×3): qty 2

## 2014-06-11 MED ORDER — MENTHOL 3 MG MT LOZG
1.0000 | LOZENGE | OROMUCOSAL | Status: DC | PRN
  Filled 2014-06-11: qty 9

## 2014-06-11 MED ORDER — BUPIVACAINE LIPOSOME 1.3 % IJ SUSP
20.0000 mL | Freq: Once | INTRAMUSCULAR | Status: DC
  Filled 2014-06-11: qty 20

## 2014-06-11 MED ORDER — PHENOL 1.4 % MT LIQD: 1.0000 | OROMUCOSAL | Status: DC | PRN

## 2014-06-11 MED ORDER — HYDROMORPHONE HCL PF 1 MG/ML IJ SOLN
0.2500 mg | INTRAMUSCULAR | Status: DC | PRN
  Administered 2014-06-11: 0.5 mg via INTRAVENOUS

## 2014-06-11 MED ORDER — DEXAMETHASONE SODIUM PHOSPHATE 10 MG/ML IJ SOLN
INTRAMUSCULAR | Status: DC | PRN
  Administered 2014-06-11: 10 mg via INTRAVENOUS

## 2014-06-11 MED ORDER — SODIUM CHLORIDE 0.9 % IJ SOLN
INTRAMUSCULAR | Status: DC | PRN
  Administered 2014-06-11: 10 mL via INTRAVENOUS

## 2014-06-11 MED ORDER — MORPHINE SULFATE 2 MG/ML IJ SOLN
1.0000 mg | Freq: Once | INTRAMUSCULAR | Status: AC
Start: 2014-06-11 — End: 2014-06-11
  Administered 2014-06-11: 1 mg via INTRAVENOUS
  Filled 2014-06-11: qty 1

## 2014-06-11 MED ORDER — DEXAMETHASONE SODIUM PHOSPHATE 10 MG/ML IJ SOLN
INTRAMUSCULAR | Status: AC
  Filled 2014-06-11: qty 1

## 2014-06-11 MED ORDER — FENTANYL CITRATE 0.05 MG/ML IJ SOLN
INTRAMUSCULAR | Status: AC
  Filled 2014-06-11: qty 2

## 2014-06-11 MED ORDER — MORPHINE SULFATE 2 MG/ML IJ SOLN: 0.5000 mg | INTRAMUSCULAR | Status: DC | PRN

## 2014-06-11 MED ORDER — ACETAMINOPHEN 650 MG RE SUPP
650.0000 mg | Freq: Four times a day (QID) | RECTAL | Status: DC | PRN
Start: 2014-06-11 — End: 2014-06-14

## 2014-06-11 MED ORDER — LACTATED RINGERS IV SOLN: INTRAVENOUS | Status: DC

## 2014-06-11 MED ORDER — SUCCINYLCHOLINE CHLORIDE 20 MG/ML IJ SOLN
INTRAMUSCULAR | Status: DC | PRN
  Administered 2014-06-11: 100 mg via INTRAVENOUS

## 2014-06-11 MED ORDER — ROCURONIUM BROMIDE 100 MG/10ML IV SOLN
INTRAVENOUS | Status: AC
  Filled 2014-06-11: qty 1

## 2014-06-11 MED ORDER — HYDROMORPHONE HCL PF 1 MG/ML IJ SOLN
INTRAMUSCULAR | Status: AC
  Filled 2014-06-11: qty 1

## 2014-06-11 MED ORDER — BUPIVACAINE HCL (PF) 0.25 % IJ SOLN
INTRAMUSCULAR | Status: DC | PRN
  Administered 2014-06-11: 30 mL

## 2014-06-11 MED ORDER — EPHEDRINE SULFATE 50 MG/ML IJ SOLN
INTRAMUSCULAR | Status: DC | PRN
  Administered 2014-06-11 (×3): 10 mg via INTRAVENOUS
  Administered 2014-06-11: 5 mg via INTRAVENOUS

## 2014-06-11 MED ORDER — ENOXAPARIN SODIUM 40 MG/0.4ML ~~LOC~~ SOLN
40.0000 mg | SUBCUTANEOUS | Status: DC
  Administered 2014-06-12 – 2014-06-14 (×3): 40 mg via SUBCUTANEOUS
  Filled 2014-06-11 (×4): qty 0.4

## 2014-06-11 MED ORDER — METOCLOPRAMIDE HCL 10 MG PO TABS: 5.0000 mg | ORAL_TABLET | Freq: Three times a day (TID) | ORAL | Status: DC | PRN

## 2014-06-11 MED ORDER — BUPIVACAINE HCL (PF) 0.25 % IJ SOLN
INTRAMUSCULAR | Status: AC
  Filled 2014-06-11: qty 30

## 2014-06-11 MED ORDER — BUPIVACAINE LIPOSOME 1.3 % IJ SUSP
20.0000 mL | Freq: Once | INTRAMUSCULAR | Status: AC
  Administered 2014-06-11: 20 mL
  Filled 2014-06-11: qty 20

## 2014-06-11 MED ORDER — FLEET ENEMA 7-19 GM/118ML RE ENEM: 1.0000 | ENEMA | Freq: Once | RECTAL | Status: AC | PRN

## 2014-06-11 MED ORDER — CEFAZOLIN SODIUM-DEXTROSE 2-3 GM-% IV SOLR
2.0000 g | Freq: Four times a day (QID) | INTRAVENOUS | Status: AC
  Administered 2014-06-11 – 2014-06-12 (×2): 2 g via INTRAVENOUS
  Filled 2014-06-11 (×2): qty 50

## 2014-06-11 MED ORDER — ONDANSETRON HCL 4 MG PO TABS
4.0000 mg | ORAL_TABLET | Freq: Four times a day (QID) | ORAL | Status: DC | PRN
  Filled 2014-06-11: qty 1

## 2014-06-11 MED ORDER — LACTATED RINGERS IV SOLN
INTRAVENOUS | Status: DC | PRN
  Administered 2014-06-11 (×2): via INTRAVENOUS

## 2014-06-11 MED ORDER — NEOSTIGMINE METHYLSULFATE 10 MG/10ML IV SOLN
INTRAVENOUS | Status: DC | PRN
  Administered 2014-06-11: 5 mg via INTRAVENOUS

## 2014-06-11 MED ORDER — MORPHINE SULFATE 2 MG/ML IJ SOLN
1.0000 mg | INTRAMUSCULAR | Status: DC | PRN
Start: 2014-06-11 — End: 2014-06-11

## 2014-06-11 MED ORDER — CEFAZOLIN (ANCEF) 1 G IV SOLR
2.0000 g | INTRAVENOUS | Status: DC
  Filled 2014-06-11: qty 2

## 2014-06-11 MED ORDER — PROPOFOL 10 MG/ML IV BOLUS
INTRAVENOUS | Status: AC
  Filled 2014-06-11: qty 20

## 2014-06-11 MED ORDER — ONDANSETRON HCL 4 MG/2ML IJ SOLN
INTRAMUSCULAR | Status: DC | PRN
  Administered 2014-06-11: 4 mg via INTRAVENOUS

## 2014-06-11 MED ORDER — CEFAZOLIN SODIUM-DEXTROSE 2-3 GM-% IV SOLR
INTRAVENOUS | Status: AC
  Filled 2014-06-11: qty 50

## 2014-06-11 SURGICAL SUPPLY — 56 items
BAG SPEC THK2 15X12 ZIP CLS (MISCELLANEOUS) ×1
BAG ZIPLOCK 12X15 (MISCELLANEOUS) ×3 IMPLANT
BIT DRILL 2.8X128 (BIT) ×2 IMPLANT
BIT DRILL 2.8X128MM (BIT) ×1
BLADE SAW SAG 73X25 THK (BLADE) ×2
BLADE SAW SGTL 73X25 THK (BLADE) ×1 IMPLANT
BRUSH FEMORAL CANAL (MISCELLANEOUS) ×3 IMPLANT
CAPT HIP FX BIPOLAR/UNIPOLAR ×2 IMPLANT
CEMENT BONE DEPUY (Cement) ×6 IMPLANT
CEMENT RESTRICTOR DEPUY SZ 4 (Cement) ×6 IMPLANT
CLOSURE STERI-STRIP 1/4X4 (GAUZE/BANDAGES/DRESSINGS) ×3 IMPLANT
CLOSURE WOUND 1/2 X4 (GAUZE/BANDAGES/DRESSINGS) ×1
DRAPE INCISE IOBAN 66X45 STRL (DRAPES) ×3 IMPLANT
DRAPE ORTHO SPLIT 77X108 STRL (DRAPES) ×3
DRAPE POUCH INSTRU U-SHP 10X18 (DRAPES) ×3 IMPLANT
DRAPE SURG ORHT 6 SPLT 77X108 (DRAPES) ×1 IMPLANT
DRAPE U-SHAPE 47X51 STRL (DRAPES) ×3 IMPLANT
DRSG ADAPTIC 3X8 NADH LF (GAUZE/BANDAGES/DRESSINGS) ×3 IMPLANT
DRSG EMULSION OIL 3X16 NADH (GAUZE/BANDAGES/DRESSINGS) ×3 IMPLANT
DRSG MEPILEX BORDER 4X4 (GAUZE/BANDAGES/DRESSINGS) ×3 IMPLANT
DRSG MEPILEX BORDER 4X8 (GAUZE/BANDAGES/DRESSINGS) ×3 IMPLANT
DRSG PAD ABDOMINAL 8X10 ST (GAUZE/BANDAGES/DRESSINGS) ×3 IMPLANT
DURAPREP 26ML APPLICATOR (WOUND CARE) ×3 IMPLANT
ELECT REM PT RETURN 9FT ADLT (ELECTROSURGICAL) ×3
ELECTRODE REM PT RTRN 9FT ADLT (ELECTROSURGICAL) ×1 IMPLANT
EVACUATOR 1/8 PVC DRAIN (DRAIN) ×3 IMPLANT
FACESHIELD WRAPAROUND (MASK) ×9 IMPLANT
GAUZE SPONGE 4X4 12PLY STRL (GAUZE/BANDAGES/DRESSINGS) ×6 IMPLANT
GLOVE BIO SURGEON STRL SZ7.5 (GLOVE) ×3 IMPLANT
GLOVE BIO SURGEON STRL SZ8 (GLOVE) ×6 IMPLANT
GLOVE BIOGEL PI IND STRL 8 (GLOVE) ×1 IMPLANT
GLOVE BIOGEL PI INDICATOR 8 (GLOVE) ×2
GOWN STRL REUS W/TWL LRG LVL3 (GOWN DISPOSABLE) ×3 IMPLANT
GOWN STRL REUS W/TWL XL LVL3 (GOWN DISPOSABLE) ×3 IMPLANT
HANDPIECE INTERPULSE COAX TIP (DISPOSABLE) ×3
IMMOBILIZER KNEE 20 (SOFTGOODS) ×6 IMPLANT
IMMOBILIZER KNEE 20 THIGH 36 (SOFTGOODS) ×1 IMPLANT
KIT BASIN OR (CUSTOM PROCEDURE TRAY) ×3 IMPLANT
MANIFOLD NEPTUNE II (INSTRUMENTS) ×3 IMPLANT
PACK TOTAL JOINT (CUSTOM PROCEDURE TRAY) ×3 IMPLANT
PADDING CAST COTTON 6X4 STRL (CAST SUPPLIES) ×6 IMPLANT
PASSER SUT SWANSON 36MM LOOP (INSTRUMENTS) ×3 IMPLANT
POSITIONER SURGICAL ARM (MISCELLANEOUS) ×3 IMPLANT
PRESSURIZER FEMORAL UNIV (MISCELLANEOUS) ×3 IMPLANT
SCOTCHCAST PLUS 4X4 WHITE (CAST SUPPLIES) ×2 IMPLANT
SET HNDPC FAN SPRY TIP SCT (DISPOSABLE) ×1 IMPLANT
STRIP CLOSURE SKIN 1/2X4 (GAUZE/BANDAGES/DRESSINGS) ×2 IMPLANT
SUT ETHIBOND NAB CT1 #1 30IN (SUTURE) ×6 IMPLANT
SUT MNCRL AB 4-0 PS2 18 (SUTURE) ×3 IMPLANT
SUT VIC AB 1 CT1 27 (SUTURE) ×9
SUT VIC AB 1 CT1 27XBRD ANTBC (SUTURE) ×3 IMPLANT
SUT VIC AB 2-0 CT1 27 (SUTURE) ×9
SUT VIC AB 2-0 CT1 TAPERPNT 27 (SUTURE) ×3 IMPLANT
SUT VLOC 180 0 24IN GS25 (SUTURE) ×6 IMPLANT
TOWEL OR 17X26 10 PK STRL BLUE (TOWEL DISPOSABLE) ×6 IMPLANT
TOWER CARTRIDGE SMART MIX (DISPOSABLE) ×3 IMPLANT

## 2014-06-11 NOTE — Op Note (Signed)
OPERATIVE REPORT   PREOPERATIVE DIAGNOSIS:  Right femoral neck fracture displaced.   POSTOPERATIVE DIAGNOSIS:  Right femoral neck fracture displaced.   PROCEDURE:  Right hip hemiarthroplasty.   SURGEON: Gaynelle Arabian, M.D.   ASSISTANT: Arlee Muslim, PA-C  ANESTHESIA: General  ESTIMATED BLOOD LOSS:100 ml    DRAINS: Hemovac x1.   COMPLICATIONS: None.   CONDITION: Stable to recovery.   BRIEF CLINICAL NOTE: [name] [name] is an 80 y.o. female with  significant dementia who had a fall yesterday leading to a displaced   Right femoral neck fracture. She was evaluated in the emergency  department and admitted to the medical service. She was cleared for  surgery and presents for right hip hemiarthroplasty.   PROCEDURE IN DETAIL: After successful administration of general  anesthetic, the patient was placed in the Left lateral decubitus  position with the Right side up and held with the hip positioner.Right lower extremity was isolated from her perineum with plastic drapes and  prepped and draped in the usual sterile fashion. Short posterolateral  incision was made with a 10 blade through the subcutaneous tissue to the  level of fascia lata which was incised in line with the skin incision.  The sciatic nerve was palpated and protected, and short rotators and  capsule were isolated off the femur. There was complete fracture of the  femoral neck. The femoral head was removed. Diameters 45 mm. We then  isolated the femur with retractors and freshened up the femoral neck cut  with an oscillating saw. I used a starter reamer to get into the  femoral canal and then irrigated with saline. A lateralizing reamer was  then placed. We then broached up to a size 5, which had excellent  rotational and torsional stability. Trial neck for the size 5 with a  28 + 1.5 head and 45 bipolar was placed. Hip was reduced with excellent  stability. I placed the right leg on top of the left leg, and  lengths  were equal. Hip was dislocated. All trials were removed. We trialed  the cement restrictor, and size 4 was most appropriate. The size 4  cement restrictor was placed at the appropriate depth in the femoral  canal. Canal was then prepared with pulsatile lavage and thoroughly  dried. Cement was mixed and once ready implantation was injected into  the femoral canal and pressurized. The size 5 [place] basic  cemented stem was then cemented into place in about 20 degrees of  anteversion. When the cement was fully hardened, then the permanent 28 + 1.5  head and 45 bipolar components were placed. Hip was reduced with  same stability parameters. Capsule and short rotators reattached to the  femur through drill holes with Ethibond suture. Fascia lata was closed  over Hemovac drain with running #1 V-Loc suture. Subcu closed with 2-0  Vicryl, and subcuticular running 4-0 Monocryl. The drains were hooked  to suction. Incision was cleaned and dried and Steri-Strips and a bulky  sterile dressing applied. She was then placed into a knee immobilizer,  awakened and transported to recovery in stable condition.   Gaynelle Arabian, M.D.

## 2014-06-11 NOTE — Interval H&P Note (Signed)
History and Physical Interval Note:  06/11/2014 3:42 PM  [name] [name]  has presented today for surgery, with the diagnosis of [place]  The various methods of treatment have been discussed with the patient and family. After consideration of risks, benefits and other options for treatment, the patient has consented to  Procedure(s): ARTHROPLASTY BIPOLAR HIP (Right) as a surgical intervention .  The patient's history has been reviewed, patient examined, no change in status, stable for surgery.  I have reviewed the patient's chart and labs.  Questions were answered to the patient's satisfaction.     Gearlean Alf

## 2014-06-11 NOTE — Transfer of Care (Signed)
Immediate Anesthesia Transfer of Care Note  Patient: [name] [name]  Procedure(s) Performed: Procedure(s) (LRB): RIGHT HIP HEMI ARTHROPLASTY (Right)  Patient Location: PACU  Anesthesia Type: General  Level of Consciousness: sedated, patient cooperative and responds to stimulation  Airway & Oxygen Therapy: Patient Spontanous Breathing and Patient connected to face mask oxgen  Post-op Assessment: Report given to PACU RN and Post -op Vital signs reviewed and stable  Post vital signs: Reviewed and stable  Complications: No apparent anesthesia complicationsImmediate Anesthesia Transfer of Care Note  Patient: [name] [name]  Procedure(s) Performed: Procedure(s): RIGHT HIP HEMI ARTHROPLASTY (Right)  Patient Location: PACU  Anesthesia Type:General  Level of Consciousness: awake, alert  and oriented  Airway & Oxygen Therapy: Patient Spontanous Breathing and Patient connected to face mask oxygen  Post-op Assessment: Report given to PACU RN  Post vital signs: Reviewed and stable  Complications: No apparent anesthesia complications

## 2014-06-11 NOTE — Progress Notes (Addendum)
[facility] PROGRESS NOTE  ELIORA NIENHUIS IRC:[id number] DOB: [birth year] DOA: 06/10/2014 PCP: Wenda Low, MD  Assessment/Plan: Active Problems:   ASTHMA, CHRONIC OBSTRUCTIVE NOS   S/P total knee arthroplasty   Closed stable fracture of multiple pubic rami   Hip fracture   Right hip/femoral neck fracture  -As discussed above, orthopedics consulted in EDP  -analgesics for pain management  -she is chest pain free, EKG with no ischemic findings, and oxygenating well on room air, CXR with no acute infiltrates. Will optimize her pulmonary status perioperatively by continuing her outpt bronchodilators and adding prn nebs  -Her surgical risk is age related, from medical standpoint , she clearly does not provide any symptomatic cardiac history Therefore no obvious contraindication to right hemiarthroplasty at this time  . Closed stable fracture of multiple pubic rami  -follow and continue PT Lovenox for DVT prophylaxis or as advised by orthopedics  . ASTHMA, CHRONIC OBSTRUCTIVE NOS  -Stable, continue outpt meds and prn nebs  Continue Flonase, dulera   .S/P Total knee arthroplasty  -about months ago(03/2014) per Dr. Maureen Ralphs  -follow and continue PT  . H/O Lung cancer     Code Status: full Family Communication: family updated about patient's clinical progress Disposition Plan:  Transferred to [facility] as per Dr Maureen Ralphs   Brief narrative: [name] [name] is a 80 y.o. female medical history significant for pelvic fracture, status post left total knee arthroplasty in May of 2015 who was residing at [facility] and presents with above complaints. She reports that early this a.m. she got up to go to the bathroom and when she turned around to get back into the wheelchair "something happened", and when she sat down in the wheelchair that she began having severe pain in her right hip. She denies any falls. She was seen in the ED and x-ray revealed no right femoral neck fracture, and the healing  nondisplaced right superior and inferior pubic rami fractures were noted. Orthopedics was consulted per EDP and she is admitted to [facility] for further evaluation and management   Consultants:  Orthopedics  Procedures:  None  Antibiotics:  None  HPI/Subjective: Denies any pain, comfortable  Objective: Filed Vitals:   06/10/14 2000 06/10/14 2004 06/10/14 2124 06/11/14 0527  BP:  150/52  155/62  Pulse:  72 70 81  Temp:  97.9 F (36.6 C)  98 F (36.7 C)  TempSrc:  Oral  Oral  Resp: 20 16 20 20   SpO2: 93% 94% 93% 94%    Intake/Output Summary (Last 24 hours) at 06/11/14 1009 Last data filed at 06/11/14 0800  Gross per 24 hour  Intake 1578.33 ml  Output   2200 ml  Net -621.67 ml    Exam:  General: alert & oriented x 3 In NAD  Cardiovascular: RRR, nl S1 s2  Respiratory: Decreased breath sounds at the bases, scattered rhonchi, no crackles  Abdomen: soft +BS NT/ND, no masses palpable  Extremities: No cyanosis and no edema      Data Reviewed: Basic Metabolic Panel:  Recent Labs Lab 06/10/14 0705 06/11/14 0515  NA 138 139  K 4.5 4.3  CL 99 101  CO2 27 27  GLUCOSE 113* 107*  BUN 14 8  CREATININE 0.65 0.66  CALCIUM 9.1 8.5    Liver Function Tests: No results found for this basename: AST, ALT, ALKPHOS, BILITOT, PROT, ALBUMIN,  in the last 168 hours No results found for this basename: LIPASE, AMYLASE,  in the last 168 hours No results found for  this basename: AMMONIA,  in the last 168 hours  CBC:  Recent Labs Lab 06/10/14 0705 06/11/14 0515  WBC 8.8 7.3  NEUTROABS 6.6  --   HGB 10.9* 11.0*  HCT 34.4* 34.4*  MCV 84.1 84.3  PLT 365 336    Cardiac Enzymes: No results found for this basename: CKTOTAL, CKMB, CKMBINDEX, TROPONINI,  in the last 168 hours BNP (last 3 results) No results found for this basename: PROBNP,  in the last 8760 hours   CBG: No results found for this basename: GLUCAP,  in the last 168 hours  Recent Results (from the past 240  hour(s))  SURGICAL PCR SCREEN     Status: None   Collection Time    06/11/14  1:32 AM      Result Value Ref Range Status   MRSA, PCR NEGATIVE  NEGATIVE Final   Staphylococcus aureus NEGATIVE  NEGATIVE Final   Comment:            The Xpert SA Assay (FDA     approved for NASAL specimens     in patients over 53 years of age),     is one component of     a comprehensive surveillance     program.  Test performance has     been validated by [employer] for patients greater     than or equal to 43 year old.     It is not intended     to diagnose infection nor to     guide or monitor treatment.     Studies: Dg Chest 1 View  06/10/2014   CLINICAL DATA:  Right hip pain  EXAM: CHEST - 1 VIEW  COMPARISON:  None.  FINDINGS: Normal cardiac silhouette. Lungs are hyperinflated. Chronic bronchitic markings noted. No pulmonary edema. No infiltrate.  IMPRESSION: 1.  No acute cardiopulmonary process. 2. Hyperinflated lungs with chronic bronchitic markings.   Electronically Signed   By: Suzy Bouchard M.D.   On: 06/10/2014 07:30   Dg Hip Complete Right  06/10/2014   CLINICAL DATA:  Right hip pain.  EXAM: RIGHT HIP - COMPLETE 2+ VIEW  COMPARISON:  05/06/2014  FINDINGS: There is a new, mildly displaced right femoral neck fracture with mild varus angulation. There is no dislocation. Nondisplaced right superior and inferior pubic rami fractures are again noted with some softening of the fracture lines and evidence of early callus formation. Lower lumbar fusion is noted.  IMPRESSION: 1. New right femoral neck fracture. 2. Healing, nondisplaced right superior and inferior pubic rami fractures.   Electronically Signed   By: Logan Bores   On: 06/10/2014 07:35    Scheduled Meds: . Derrill Memo ON 06/12/2014] aspirin EC  81 mg Oral Daily  .  ceFAZolin (ANCEF) IV  2 g Intravenous On Call to OR  . docusate sodium  100 mg Oral Daily  . famotidine  20 mg Oral Daily  . fluticasone  1 spray Each Nare Daily  . loratadine   10 mg Oral Daily  . methocarbamol  500 mg Oral Q6H  . metoprolol succinate  50 mg Oral q morning - 10a  . mometasone-formoterol  2 puff Inhalation BID  . sertraline  50 mg Oral Daily   Continuous Infusions: . sodium chloride 50 mL/hr at 06/10/14 0938    Active Problems:   ASTHMA, CHRONIC OBSTRUCTIVE NOS   S/P total knee arthroplasty   Closed stable fracture of multiple pubic rami   Hip fracture  Time spent: 40 minutes   [facility] Pager [phone]. If 8PM-8AM, please contact night-coverage at [email or web address], password [facility] 06/11/2014, 10:09 AM  LOS: 1 day

## 2014-06-11 NOTE — Anesthesia Preprocedure Evaluation (Addendum)
Anesthesia Evaluation  Patient identified by MRN, date of birth, ID band Patient awake    Reviewed: Allergy & Precautions, H&P , NPO status , Patient's Chart, lab work & pertinent test results, reviewed documented beta blocker date and time   Airway Mallampati: II TM Distance: >3 FB Neck ROM: full    Dental  (+) Edentulous Upper, Edentulous Lower, Dental Advisory Given   Pulmonary asthma , COPD COPD inhaler, former smoker,  Lung Ca breath sounds clear to auscultation  Pulmonary exam normal       Cardiovascular hypertension, On Home Beta Blockers Rhythm:regular Rate:Normal     Neuro/Psych negative neurological ROS  negative psych ROS   GI/Hepatic negative GI ROS, Neg liver ROS,   Endo/Other  negative endocrine ROS  Renal/GU negative Renal ROS  negative genitourinary   Musculoskeletal   Abdominal   Peds  Hematology negative hematology ROS (+)   Anesthesia Other Findings   Reproductive/Obstetrics negative OB ROS                          Anesthesia Physical Anesthesia Plan  ASA: III  Anesthesia Plan: General   Post-op Pain Management:    Induction: Intravenous  Airway Management Planned: Oral ETT  Additional Equipment:   Intra-op Plan:   Post-operative Plan: Extubation in OR  Informed Consent: I have reviewed the patients History and Physical, chart, labs and discussed the procedure including the risks, benefits and alternatives for the proposed anesthesia with the patient or authorized representative who has indicated his/her understanding and acceptance.   Dental Advisory Given  Plan Discussed with: CRNA and Surgeon  Anesthesia Plan Comments:        Anesthesia Quick Evaluation

## 2014-06-11 NOTE — Anesthesia Postprocedure Evaluation (Signed)
  Anesthesia Post-op Note  Patient: [name] [name]  Procedure(s) Performed: Procedure(s) (LRB): RIGHT HIP HEMI ARTHROPLASTY (Right)  Patient Location: PACU  Anesthesia Type: General  Level of Consciousness: awake and alert   Airway and Oxygen Therapy: Patient Spontanous Breathing  Post-op Pain: mild  Post-op Assessment: Post-op Vital signs reviewed, Patient's Cardiovascular Status Stable, Respiratory Function Stable, Patent Airway and No signs of Nausea or vomiting  Last Vitals:  Filed Vitals:   06/11/14 1800  BP:   Pulse:   Temp: 36.8 C  Resp: 18    Post-op Vital Signs: stable   Complications: No apparent anesthesia complications

## 2014-06-12 LAB — COMPREHENSIVE METABOLIC PANEL
ALT: 71 U/L — AB (ref 0–35)
AST: 89 U/L — AB (ref 0–37)
Albumin: 2.6 g/dL — ABNORMAL LOW (ref 3.5–5.2)
Alkaline Phosphatase: 158 U/L — ABNORMAL HIGH (ref 39–117)
Anion gap: 11 (ref 5–15)
BUN: 12 mg/dL (ref 6–23)
CHLORIDE: 99 meq/L (ref 96–112)
CO2: 27 meq/L (ref 19–32)
Calcium: 8.6 mg/dL (ref 8.4–10.5)
Creatinine, Ser: 0.65 mg/dL (ref 0.50–1.10)
GFR calc Af Amer: >90 mL/min (ref >90)
GFR, EST NON AFRICAN AMERICAN: 79 mL/min — AB (ref >90)
Glucose, Bld: 158 mg/dL — ABNORMAL HIGH (ref 70–99)
Potassium: 4.6 mEq/L (ref 3.7–5.3)
Sodium: 137 mEq/L (ref 137–147)
Total Bilirubin: 0.3 mg/dL (ref 0.3–1.2)
Total Protein: 5.6 g/dL — ABNORMAL LOW (ref 6.0–8.3)

## 2014-06-12 MED ORDER — OXYCODONE HCL 5 MG PO TABS
5.0000 mg | ORAL_TABLET | ORAL | Status: DC | PRN
  Administered 2014-06-12 (×2): 5 mg via ORAL
  Administered 2014-06-13 (×2): 10 mg via ORAL
  Administered 2014-06-13 (×2): 5 mg via ORAL
  Administered 2014-06-14 (×2): 10 mg via ORAL
  Administered 2014-06-14: 5 mg via ORAL
  Filled 2014-06-12: qty 1
  Filled 2014-06-12 (×2): qty 2
  Filled 2014-06-12 (×2): qty 1
  Filled 2014-06-12 (×2): qty 2
  Filled 2014-06-12 (×2): qty 1

## 2014-06-12 NOTE — Clinical Social Work Psychosocial (Signed)
Clinical Social Work Department BRIEF PSYCHOSOCIAL ASSESSMENT 06/12/2014  Patient:  [name] [name], [name] [name]     Account Number:  [id number]     Admit date:  06/10/2014  Clinical Social Worker:  Daiva Huge  Date/Time:  06/12/2014 03:30 PM  Referred by:  Physician  Date Referred:  06/12/2014 Referred for  SNF Placement   Other Referral:   Interview type:  Patient Other interview type:    PSYCHOSOCIAL DATA Living Status:  FACILITY Admitted from facility:  [facility] Level of care:  [place] Primary support name:  daughter in law [place] Primary support relationship to patient:  FAMILY Degree of support available:   good    CURRENT CONCERNS Current Concerns  Post-Acute Placement   Other Concerns:    SOCIAL WORK ASSESSMENT / PLAN CSW met with patient who shared with me that she has been residing at [facility] since May- she stresses this was for short rehab after she had her knee replaced in May- she was close to getting released when she fell and broke her pelvis- she returned there for more rehab and again was close to be released back to her home where she was living independently when she sat down in the chair (transferring from wheelchiar to toilet) and broke her hip. "Ive been told I have strong bones in the past"- she is in good spirits and is interested in NOT going back to [place]- she wants to consider other possible SNF options- I have left a SNF list with her to review and have left message for her daughter in law to contact us to discuss options further-   Assessment/plan status:  Other - See comment Other assessment/ plan:   FL2 updated for SNF search   Information/referral to community resources:   SNF list    PATIENT'S/FAMILY'S RESPONSE TO PLAN OF CARE: Patient is very eager to get home- she misses her neighbors and her townhome. She is hopeful for return home soon! Support and encourgement with provided- she is  appreciative of assistance.       Eduard Clos, MSW, [place]

## 2014-06-12 NOTE — Clinical Social Work Placement (Signed)
Clinical Social Work Department CLINICAL SOCIAL WORK PLACEMENT NOTE 06/12/2014  Patient:  [name] [name], [name] [name]  Account Number:  [id number] Admit date:  06/10/2014  Clinical Social Worker:  Daiva Huge  Date/time:  06/12/2014 04:17 PM  Clinical Social Work is seeking post-discharge placement for this patient at the following level of care:   SKILLED NURSING   (*CSW will update this form in Epic as items are completed)   06/12/2014  Patient/family provided with [place] Department of Clinical Social Work's list of facilities offering this level of care within the geographic area requested by the patient (or if unable, by the patient's family).  06/12/2014  Patient/family informed of their freedom to choose among providers that offer the needed level of care, that participate in Medicare, Medicaid or managed care program needed by the patient, have an available bed and are willing to accept the patient.  06/12/2014  Patient/family informed of [facility]' ownership interest in [facility], as well as of the fact that they are under no obligation to receive care at this facility.  PASARR submitted to EDS on 06/12/2014 PASARR number received on 06/12/2014  FL2 transmitted to all facilities in geographic area requested by pt/family on  06/12/2014 FL2 transmitted to all facilities within larger geographic area on   Patient informed that his/her managed care company has contracts with or will negotiate with  certain facilities, including the following:     Patient/family informed of bed offers received:   Patient chooses bed at  Physician recommends and patient chooses bed at    Patient to be transferred to  on   Patient to be transferred to facility by  Patient and family notified of transfer on  Name of family member notified:    The following physician request were entered in Epic:   Additional Comments: Eduard Clos, MSW, [place]

## 2014-06-12 NOTE — Progress Notes (Signed)
   Subjective: 1 Day Post-Op Procedure(s) (LRB): RIGHT HIP HEMI ARTHROPLASTY (Right) Patient reports pain as mild and moderate.   Patient seen in rounds with Dr. Wynelle Link. Patient is having problems with pain in the hip, requiring pain medications We will start therapy today.  Plan is to go Skilled nursing facility after hospital stay.  Objective: Vital signs in last 24 hours: Temp:  [97.4 F (36.3 C)-99.3 F (37.4 C)] 97.7 F (36.5 C) (08/09 0503) Pulse Rate:  [60-88] 70 (08/09 0503) Resp:  [13-19] 16 (08/09 0503) BP: (103-150)/(23-76) 121/63 mmHg (08/09 0503) SpO2:  [88 %-100 %] 96 % (08/09 0503) Weight:  [71.895 kg (158 lb 8 oz)] 71.895 kg (158 lb 8 oz) (08/08 1250)  Intake/Output from previous day:  Intake/Output Summary (Last 24 hours) at 06/12/14 0806 Last data filed at 06/12/14 0755  Gross per 24 hour  Intake   2330 ml  Output   1035 ml  Net   1295 ml    Intake/Output this shift: Total I/O In: -  Out: 145 [Urine:125; Drains:20]  Labs:  Recent Labs  06/10/14 0705 06/11/14 0515  HGB 10.9* 11.0*    Recent Labs  06/10/14 0705 06/11/14 0515  WBC 8.8 7.3  RBC 4.09 4.08  HCT 34.4* 34.4*  PLT 365 336    Recent Labs  06/11/14 0515 06/12/14 0433  NA 139 137  K 4.3 4.6  CL 101 99  CO2 27 27  BUN 8 12  CREATININE 0.66 0.65  GLUCOSE 107* 158*  CALCIUM 8.5 8.6    Recent Labs  06/10/14 0705  INR 0.93    EXAM General - Patient is Alert and Appropriate Extremity - Neurovascular intact Sensation intact distally Dressing - no drainage Motor Function - intact, moving foot and toes well on exam.  Hemovac pulled without difficulty.  Past Medical History  Diagnosis Date  . Chronic obstructive asthma, unspecified   . Allergic rhinitis, cause unspecified   . Angioneurotic edema not elsewhere classified   . Cancer 2006    lung ca  . Malignant neoplasm of bronchus and lung, unspecified site   . Environmental allergies   . Headache(784.0)   .  Depression   . Arthritis   . Collapsed lung 1959    hx of with chest tube insertion  . Complication of anesthesia     severe headache x1  . UTI (urinary tract infection)     Assessment/Plan: 1 Day Post-Op Procedure(s) (LRB): RIGHT HIP HEMI ARTHROPLASTY (Right) Principal Problem:   Hip fracture Active Problems:   ASTHMA, CHRONIC OBSTRUCTIVE NOS   S/P total knee arthroplasty   Closed stable fracture of multiple pubic rami  Estimated body mass index is 28.98 kg/(m^2) as calculated from the following:   Height as of this encounter: 5\' 2"  (1.575 m).   Weight as of this encounter: 71.895 kg (158 lb 8 oz). Up with therapy  DVT Prophylaxis - Lovenox Weight Bearing As Tolerated right Leg D/C Knee Immobilizer Hemovac Pulled Begin Therapy Hip Preacutions  Arlee Muslim, PA-C Orthopaedic Surgery 06/12/2014, 8:06 AM

## 2014-06-12 NOTE — Evaluation (Addendum)
Physical Therapy Evaluation Patient Details Name: [name] [name] MRN: [id number] DOB: [birth year] Today's Date: 06/12/2014   History of Present Illness  80 yo female s/p R hip hemi 06/11/14. Hx of L TKA, lung cancer, asthma, pelvic fx. Pt is from SNF.   Clinical Impression  On eval, pt required Max assist +2 for mobility-able to stand and take a few lateral steps with RW. Difficulty WBing on R LE. Mobility significantly limited by pain. Recommend ST rehab at SNF.     Follow Up Recommendations SNF    Equipment Recommendations  None recommended by PT    Recommendations for Other Services OT consult     Precautions / Restrictions Precautions Precautions: Fall;Posterior Hip Precaution Comments: Reviewed hip precautions Restrictions Weight Bearing Restrictions: Yes RLE Weight Bearing: Weight bearing as tolerated      Mobility  Bed Mobility Overal bed mobility: Needs Assistance Bed Mobility: Supine to Sit     Supine to sit: Max assist;+2 for physical assistance;+2 for safety/equipment;HOB elevated     General bed mobility comments: Max encouragement for participation. Assist for trunk and bil LEs. Utilized bedpad for scooting, positioning. Increased time.   Transfers Overall transfer level: Needs assistance Equipment used: Rolling walker (2 wheeled) Transfers: Sit to/from Stand Sit to Stand: Max assist;+2 physical assistance;+2 safety/equipment;From elevated surface         General transfer comment: assist to rise, stabilize, control descent. Multimodal cues for safety, technique, hand placement.   Ambulation/Gait Ambulation/Gait assistance: Max assist;+2 physical assistance;+2 safety/equipment   Assistive device: Rolling walker (2 wheeled)       General Gait Details: 4 side steps towards HOB with RW. Pt unable to clear L foot so pt pivoted/slid foot instead. Increased time. Assist to stabilize/support pt and maneuver with walker.   Stairs             Wheelchair Mobility    Modified Rankin (Stroke Patients Only)       Balance Overall balance assessment: Needs assistance;History of Falls         Standing balance support: Bilateral upper extremity supported;During functional activity Standing balance-Leahy Scale: Poor                               Pertinent Vitals/Pain Pain Assessment: 0-10 Pain Score: 5  Pain Location: r hip/pelvis area Pain Intervention(s): Limited activity within patient's tolerance;Repositioned;Ice applied    Home Living Family/patient expects to be discharged to:: Skilled nursing facility                      Prior Function Level of Independence: Needs assistance   Gait / Transfers Assistance Needed: using wheelchair some per pt.            Hand Dominance        Extremity/Trunk Assessment   Upper Extremity Assessment: Generalized weakness           Lower Extremity Assessment: RLE deficits/detail RLE Deficits / Details: moves ankle.     Cervical / Trunk Assessment: Kyphotic  Communication   Communication: No difficulties  Cognition Arousal/Alertness: Awake/alert Behavior During Therapy: Anxious Overall Cognitive Status: Within Functional Limits for tasks assessed                      General Comments      Exercises        Assessment/Plan    PT Assessment Patient needs continued PT services  PT Diagnosis Difficulty walking;Abnormality of gait;Generalized weakness;Acute pain   PT Problem List Decreased strength;Decreased range of motion;Decreased activity tolerance;Decreased balance;Decreased mobility;Decreased knowledge of use of DME;Pain;Decreased knowledge of precautions  PT Treatment Interventions DME instruction;Gait training;Functional mobility training;Therapeutic activities;Patient/family education;Balance training;Therapeutic exercise   PT Goals (Current goals can be found in the Care Plan section) Acute Rehab PT Goals Patient  Stated Goal: less pain. walk.  PT Goal Formulation: With patient Time For Goal Achievement: 06/26/14 Potential to Achieve Goals: Good    Frequency Min 3X/week   Barriers to discharge        Co-evaluation               End of Session Equipment Utilized During Treatment: Gait belt Activity Tolerance: Patient limited by fatigue;Patient limited by pain Patient left: in bed;with call bell/phone within reach;with bed alarm set           Time: 1422-1450 PT Time Calculation (min): 28 min   Charges:   PT Evaluation $Initial PT Evaluation Tier I: 1 Procedure PT Treatments $Therapeutic Activity: 23-37 mins   PT G Codes:          Weston Anna, MPT Pager: [phone]

## 2014-06-12 NOTE — Progress Notes (Signed)
Dtr in law states she removed pts belongings from [facility].Discussed  interest in [facility] near [place] for possible SNF dc placement [place]

## 2014-06-12 NOTE — Progress Notes (Signed)
[facility] PROGRESS NOTE  [name] [name] IWO:[id number] DOB: [birth year] DOA: 06/10/2014 PCP: Wenda Low, MD  Assessment/Plan:  Right hip/femoral neck fracture  C/o pain postop. Will adjust meds. To SNF when ok with ortho.  . ASTHMA, CHRONIC OBSTRUCTIVE NOS  -Stable, continue outpt meds and prn nebs  Continue Flonase, dulera   Code Status: full Family Communication: family at bedside Disposition Plan:  SNF eventually  Brief narrative: [name] [name] is a 80 y.o. female medical history significant for pelvic fracture, status post left total knee arthroplasty in May of 2015 who was residing at [facility] and presents with above complaints. She reports that early this a.m. she got up to go to the bathroom and when she turned around to get back into the wheelchair "something happened", and when she sat down in the wheelchair that she began having severe pain in her right hip. She denies any falls. She was seen in the ED and x-ray revealed no right femoral neck fracture, and the healing nondisplaced right superior and inferior pubic rami fractures were noted. Orthopedics was consulted per EDP and she is admitted to [facility] for further evaluation and management  Consultants:  Orthopedics  Procedures:  None  Antibiotics:  None  HPI/Subjective: C/o pain.  Objective: Filed Vitals:   06/12/14 0400 06/12/14 0503 06/12/14 0846 06/12/14 1004  BP:  121/63  139/43  Pulse:  70  76  Temp:  97.7 F (36.5 C)  100 F (37.8 C)  TempSrc:  Oral  Oral  Resp: 16 16  16   Height:      Weight:      SpO2:  96% 95% 100%    Intake/Output Summary (Last 24 hours) at 06/12/14 1355 Last data filed at 06/12/14 1313  Gross per 24 hour  Intake   2690 ml  Output   1035 ml  Net   1655 ml    Exam:  General: alert & oriented x 3 In NAD  Cardiovascular: RRR, nl S1 s2  Respiratory: Decreased breath sounds at the bases, scattered rhonchi, no crackles  Abdomen: soft +BS NT/ND, no masses palpable   Extremities: No cyanosis and no edema  Data Reviewed: Basic Metabolic Panel:  Recent Labs Lab 06/10/14 0705 06/11/14 0515 06/12/14 0433  NA 138 139 137  K 4.5 4.3 4.6  CL 99 101 99  CO2 27 27 27   GLUCOSE 113* 107* 158*  BUN 14 8 12   CREATININE 0.65 0.66 0.65  CALCIUM 9.1 8.5 8.6    Liver Function Tests:  Recent Labs Lab 06/12/14 0433  AST 89*  ALT 71*  ALKPHOS 158*  BILITOT 0.3  PROT 5.6*  ALBUMIN 2.6*   No results found for this basename: LIPASE, AMYLASE,  in the last 168 hours No results found for this basename: AMMONIA,  in the last 168 hours  CBC:  Recent Labs Lab 06/10/14 0705 06/11/14 0515  WBC 8.8 7.3  NEUTROABS 6.6  --   HGB 10.9* 11.0*  HCT 34.4* 34.4*  MCV 84.1 84.3  PLT 365 336    Cardiac Enzymes: No results found for this basename: CKTOTAL, CKMB, CKMBINDEX, TROPONINI,  in the last 168 hours BNP (last 3 results) No results found for this basename: PROBNP,  in the last 8760 hours   CBG: No results found for this basename: GLUCAP,  in the last 168 hours  Recent Results (from the past 240 hour(s))  SURGICAL PCR SCREEN     Status: None   Collection Time    06/11/14  1:32 AM      Result Value Ref Range Status   MRSA, PCR NEGATIVE  NEGATIVE Final   Staphylococcus aureus NEGATIVE  NEGATIVE Final   Comment:            The Xpert SA Assay (FDA     approved for NASAL specimens     in patients over 94 years of age),     is one component of     a comprehensive surveillance     program.  Test performance has     been validated by [employer] for patients greater     than or equal to 20 year old.     It is not intended     to diagnose infection nor to     guide or monitor treatment.     Studies: Dg Chest 1 View  06/10/2014   CLINICAL DATA:  Right hip pain  EXAM: CHEST - 1 VIEW  COMPARISON:  None.  FINDINGS: Normal cardiac silhouette. Lungs are hyperinflated. Chronic bronchitic markings noted. No pulmonary edema. No infiltrate.   IMPRESSION: 1.  No acute cardiopulmonary process. 2. Hyperinflated lungs with chronic bronchitic markings.   Electronically Signed   By: Suzy Bouchard M.D.   On: 06/10/2014 07:30   Dg Hip Complete Right  06/10/2014   CLINICAL DATA:  Right hip pain.  EXAM: RIGHT HIP - COMPLETE 2+ VIEW  COMPARISON:  05/06/2014  FINDINGS: There is a new, mildly displaced right femoral neck fracture with mild varus angulation. There is no dislocation. Nondisplaced right superior and inferior pubic rami fractures are again noted with some softening of the fracture lines and evidence of early callus formation. Lower lumbar fusion is noted.  IMPRESSION: 1. New right femoral neck fracture. 2. Healing, nondisplaced right superior and inferior pubic rami fractures.   Electronically Signed   By: Logan Bores   On: 06/10/2014 07:35    Scheduled Meds: . aspirin EC  81 mg Oral Daily  . docusate sodium  100 mg Oral Daily  . enoxaparin (LOVENOX) injection  40 mg Subcutaneous Q24H  . famotidine  20 mg Oral Daily  . fluticasone  1 spray Each Nare Daily  . loratadine  10 mg Oral Daily  . methocarbamol  500 mg Oral Q6H  . metoprolol succinate  50 mg Oral q morning - 10a  . mometasone-formoterol  2 puff Inhalation BID  . sertraline  50 mg Oral Daily   Continuous Infusions: . sodium chloride 75 mL/hr (06/11/14 2009)   Time spent: 20 minutes  [facility] Pager [phone]. If 7pm - 7AM, please contact night-coverage at [email or web address], password [facility] 06/12/2014, 1:55 PM  LOS: 2 days

## 2014-06-13 DIAGNOSIS — K5900 Constipation, unspecified: Secondary | ICD-10-CM

## 2014-06-13 LAB — BASIC METABOLIC PANEL
Anion gap: 9 (ref 5–15)
BUN: 12 mg/dL (ref 6–23)
CALCIUM: 8.8 mg/dL (ref 8.4–10.5)
CO2: 31 mEq/L (ref 19–32)
Chloride: 100 mEq/L (ref 96–112)
Creatinine, Ser: 0.76 mg/dL (ref 0.50–1.10)
GFR calc non Af Amer: 75 mL/min — ABNORMAL LOW (ref >90)
GFR, EST AFRICAN AMERICAN: 87 mL/min — AB (ref >90)
Glucose, Bld: 109 mg/dL — ABNORMAL HIGH (ref 70–99)
POTASSIUM: 4.6 meq/L (ref 3.7–5.3)
SODIUM: 140 meq/L (ref 137–147)

## 2014-06-13 LAB — CBC
HCT: 30.8 % — ABNORMAL LOW (ref 36.0–46.0)
Hemoglobin: 9.7 g/dL — ABNORMAL LOW (ref 12.0–15.0)
MCH: 26.6 pg (ref 26.0–34.0)
MCHC: 31.5 g/dL (ref 30.0–36.0)
MCV: 84.4 fL (ref 78.0–100.0)
Platelets: 294 10*3/uL (ref 150–400)
RBC: 3.65 MIL/uL — ABNORMAL LOW (ref 3.87–5.11)
RDW: 15 % (ref 11.5–15.5)
WBC: 10.6 10*3/uL — ABNORMAL HIGH (ref 4.0–10.5)

## 2014-06-13 MED ORDER — OXYCODONE HCL 5 MG PO TABS: 5.0000 mg | ORAL_TABLET | ORAL | Status: DC | PRN

## 2014-06-13 MED ORDER — ENOXAPARIN SODIUM 40 MG/0.4ML ~~LOC~~ SOLN
40.0000 mg | SUBCUTANEOUS | Status: DC
Start: 2014-06-13 — End: 2015-04-17

## 2014-06-13 MED ORDER — MAGNESIUM HYDROXIDE 400 MG/5ML PO SUSP
30.0000 mL | Freq: Every day | ORAL | Status: DC
  Administered 2014-06-13 – 2014-06-14 (×2): 30 mL via ORAL
  Filled 2014-06-13 (×2): qty 30

## 2014-06-13 MED ORDER — METHOCARBAMOL 500 MG PO TABS: 500.0000 mg | ORAL_TABLET | Freq: Four times a day (QID) | ORAL | Status: DC

## 2014-06-13 NOTE — Progress Notes (Signed)
CARE MANAGEMENT NOTE 06/13/2014  Patient:  [name] [name], [name] [name]   Account Number:  [id number]  Date Initiated:  06/12/2014  Documentation initiated by:  [facility]  Subjective/Objective Assessment:   RIGHT HIP HEMI ARTHROPLASTY (Right)     Action/Plan:   SNF   Anticipated DC Date:  06/15/2014   Anticipated DC Plan:  SKILLED NURSING FACILITY  In-house referral  Clinical Social Worker      DC Planning Services  CM consult      Choice offered to / List presented to:             Status of service:  Completed, signed off Medicare Important Message given?   (If response is "NO", the following Medicare IM given date fields will be blank) Date Medicare IM given:   Medicare IM given by:   Date Additional Medicare IM given:   Additional Medicare IM given by:    Discharge Disposition:  [place]  Per UR Regulation:    If discussed at Long Length of Stay Meetings, dates discussed:    Comments:  [id number]/Fradel Baldonado,RN,BSN,CCM: Arranging bed at [facility], pod 2-advancing diet, pain control and P.T.  06/12/2014 1515 Plan is dc to SNF. Jonnie Finner RN CCM Case Mgmt phone [phone]

## 2014-06-13 NOTE — Progress Notes (Signed)
CSW assisting with d/c planning. [facility] is unable to offer a bed at this time. Pt / daughter in-law have been updated. Daughter in-law will tour SNF's today and contact CSW in the am with SNF choice.   Werner Lean LCSW [phone]

## 2014-06-13 NOTE — Progress Notes (Signed)
Physical Therapy Treatment Patient Details Name: [name] [name] MRN: [id number] DOB: April 06, 1927 Today's Date: 06/13/2014    History of Present Illness 80 yo female s/p R hip hemi 06/11/14. Hx of L TKA, lung cancer, asthma. Pt is from SNF.     PT Comments    Progressing slowly with mobility. Able to take a few steps this session . Limited by pain. Will need SNF  Follow Up Recommendations  SNF     Equipment Recommendations  None recommended by PT    Recommendations for Other Services OT consult     Precautions / Restrictions Precautions Precautions: Posterior Hip;Fall Precaution Comments: Reviewed hip precautions Restrictions Weight Bearing Restrictions: Yes RLE Weight Bearing: Weight bearing as tolerated    Mobility  Bed Mobility Overal bed mobility: Needs Assistance Bed Mobility: Supine to Sit     Supine to sit: Max assist;+2 for physical assistance;+2 for safety/equipment;HOB elevated     General bed mobility comments: Max encouragement for participation. Assist for trunk and bil LEs. Utilized bedpad for scooting, positioning. Increased time.   Transfers Overall transfer level: Needs assistance Equipment used: Rolling walker (2 wheeled) Transfers: Sit to/from Stand Sit to Stand: Mod assist;+2 physical assistance;+2 safety/equipment;From elevated surface         General transfer comment: assist to rise, stabilize, control descent. Multimodal cues for safety, technique, hand placement.   Ambulation/Gait Ambulation/Gait assistance: Mod assist;+2 safety/equipment Ambulation Distance (Feet): 5 Feet Assistive device: Rolling walker (2 wheeled) Gait Pattern/deviations: Step-to pattern;Decreased stride length;Antalgic;Trunk flexed;Decreased step length - left;Decreased step length - right     General Gait Details: Assist to stabilize pt and maneuver with walker. Followed closely with recliner. Pt fatigues easily. Distance is limited by pain.    Stairs            Wheelchair Mobility    Modified Rankin (Stroke Patients Only)       Balance                                    Cognition Arousal/Alertness: Awake/alert Behavior During Therapy: Anxious Overall Cognitive Status: Within Functional Limits for tasks assessed                      Exercises General Exercises - Lower Extremity Ankle Circles/Pumps: AROM;Both;10 reps;Supine Quad Sets: AROM;Both;10 reps;Supine Heel Slides: AAROM;Right;10 reps;Supine (very limited ROM) Hip ABduction/ADduction: AAROM;Right;10 reps;Supine (very limited ROM)    General Comments        Pertinent Vitals/Pain      Home Living                      Prior Function            PT Goals (current goals can now be found in the care plan section) Progress towards PT goals: Progressing toward goals (slowly)    Frequency  Min 3X/week    PT Plan Current plan remains appropriate    Co-evaluation             End of Session Equipment Utilized During Treatment: Gait belt Activity Tolerance: Patient limited by fatigue;Patient limited by pain Patient left: in chair;with call bell/phone within reach     Time: 2542-7062 PT Time Calculation (min): 23 min  Charges:  $Gait Training: 8-22 mins $Therapeutic Exercise: 8-22 mins  G Codes:      Weston Anna, MPT Pager: [phone]

## 2014-06-13 NOTE — Progress Notes (Signed)
   Subjective: 2 Days Post-Op Procedure(s) (LRB): RIGHT HIP HEMI ARTHROPLASTY (Right) Patient reports pain as mild and moderate.   Patient seen in rounds by Dr. Wynelle Link. Patient is well, but has had some minor complaints of pain in the hip, requiring pain medications but doing better overall Plan is to go Skilled nursing facility after hospital stay.  Objective: Vital signs in last 24 hours: Temp:  [98 F (36.7 C)-100 F (37.8 C)] 98.3 F (36.8 C) (08/10 0549) Pulse Rate:  [69-78] 69 (08/10 0549) Resp:  [15-20] 18 (08/10 0549) BP: (124-140)/(41-53) 140/53 mmHg (08/10 0549) SpO2:  [95 %-100 %] 100 % (08/10 0549)  Intake/Output from previous day:  Intake/Output Summary (Last 24 hours) at 06/13/14 0731 Last data filed at 06/13/14 0550  Gross per 24 hour  Intake    720 ml  Output   1445 ml  Net   -725 ml    Intake/Output this shift:    Labs:  Recent Labs  06/11/14 0515 06/13/14 0512  HGB 11.0* 9.7*    Recent Labs  06/11/14 0515 06/13/14 0512  WBC 7.3 10.6*  RBC 4.08 3.65*  HCT 34.4* 30.8*  PLT 336 294    Recent Labs  06/12/14 0433 06/13/14 0512  NA 137 140  K 4.6 4.6  CL 99 100  CO2 27 31  BUN 12 12  CREATININE 0.65 0.76  GLUCOSE 158* 109*  CALCIUM 8.6 8.8   No results found for this basename: LABPT, INR,  in the last 72 hours  EXAM General - Patient is Alert, Appropriate and Oriented Extremity - Neurovascular intact Sensation intact distally Dorsiflexion/Plantar flexion intact Dressing/Incision - clean, dry, no drainage Motor Function - intact, moving foot and toes well on exam.   Past Medical History  Diagnosis Date  . Chronic obstructive asthma, unspecified   . Allergic rhinitis, cause unspecified   . Angioneurotic edema not elsewhere classified   . Cancer 2006    lung ca  . Malignant neoplasm of bronchus and lung, unspecified site   . Environmental allergies   . Headache(784.0)   . Depression   . Arthritis   . Collapsed lung 1959      hx of with chest tube insertion  . Complication of anesthesia     severe headache x1  . UTI (urinary tract infection)     Assessment/Plan: 2 Days Post-Op Procedure(s) (LRB): RIGHT HIP HEMI ARTHROPLASTY (Right) Principal Problem:   Hip fracture Active Problems:   ASTHMA, CHRONIC OBSTRUCTIVE NOS   S/P total knee arthroplasty   Closed stable fracture of multiple pubic rami  Estimated body mass index is 28.98 kg/(m^2) as calculated from the following:   Height as of this encounter: 5\' 2"  (1.575 m).   Weight as of this encounter: 71.895 kg (158 lb 8 oz). Up with therapy Discharge to SNF when stable  DVT Prophylaxis - Lovenox Weight Bearing As Tolerated right Leg  Arlee Muslim, PA-C Orthopaedic Surgery 06/13/2014, 7:31 AM

## 2014-06-13 NOTE — Progress Notes (Signed)
OT  Note  Patient Details Name: [name] [name] MRN: [id number] DOB: [birth year]   Cancelled Treatment:    Noted plans for SNF. Will defer OT eval to SNF  [place], Thereasa Parkin 06/13/2014, 11:28 AM

## 2014-06-13 NOTE — Progress Notes (Signed)
CSW assisting with d/c planning. Pt is requesting placement at [facility]. CSW has contacted SNF and a decision is pending. CSW will continue to follow to assist with d/c planning to sNF.  Werner Lean LCSW [phone]

## 2014-06-13 NOTE — Progress Notes (Signed)
[facility] PROGRESS NOTE  [name] JXB:[id number] DOB: 12/12/1926 DOA: 06/10/2014 PCP: Wenda Low, MD  Assessment/Plan:  Right hip/femoral neck fracture  To SNF when bed arranged. Stable postop. Bowel regimen  . ASTHMA, CHRONIC OBSTRUCTIVE NOS  -Stable, continue outpt meds and prn nebs  Continue Flonase, dulera. Wean oxygen  Code Status: full Family Communication: family at bedside 8/9 Disposition Plan:  SNF when bed arranged  Brief narrative: [name] [name] is a 80 y.o. female medical history significant for pelvic fracture, status post left total knee arthroplasty in May of 2015 who was residing at [facility] and presents with above complaints. She reports that early this a.m. she got up to go to the bathroom and when she turned around to get back into the wheelchair "something happened", and when she sat down in the wheelchair that she began having severe pain in her right hip. She denies any falls. She was seen in the ED and x-ray revealed no right femoral neck fracture, and the healing nondisplaced right superior and inferior pubic rami fractures were noted. Orthopedics was consulted per EDP and she is admitted to [facility] for further evaluation and management  Consultants:  Orthopedics  Procedures:  None  Antibiotics:  None  HPI/Subjective: C/o pain. No BM since surgery  Objective: Filed Vitals:   06/13/14 0549 06/13/14 0900 06/13/14 0939 06/13/14 1328  BP: 140/53  102/40 113/64  Pulse: 69  73 105  Temp: 98.3 F (36.8 C)   98.5 F (36.9 C)  TempSrc: Oral   Oral  Resp: 18   20  Height:      Weight:      SpO2: 100% 100%  93%    Intake/Output Summary (Last 24 hours) at 06/13/14 1344 Last data filed at 06/13/14 1300  Gross per 24 hour  Intake    840 ml  Output   1450 ml  Net   -610 ml    Exam:  General: alert & oriented x 3. In chair.  Cardiovascular: RRR, nl S1 s2  Respiratory: CTA without WRR Abdomen: soft +BS NT/ND, no masses palpable   Extremities: No cyanosis and no edema  Data Reviewed: Basic Metabolic Panel:  Recent Labs Lab 06/10/14 0705 06/11/14 0515 06/12/14 0433 06/13/14 0512  NA 138 139 137 140  K 4.5 4.3 4.6 4.6  CL 99 101 99 100  CO2 27 27 27 31   GLUCOSE 113* 107* 158* 109*  BUN 14 8 12 12   CREATININE 0.65 0.66 0.65 0.76  CALCIUM 9.1 8.5 8.6 8.8    Liver Function Tests:  Recent Labs Lab 06/12/14 0433  AST 89*  ALT 71*  ALKPHOS 158*  BILITOT 0.3  PROT 5.6*  ALBUMIN 2.6*   No results found for this basename: LIPASE, AMYLASE,  in the last 168 hours No results found for this basename: AMMONIA,  in the last 168 hours  CBC:  Recent Labs Lab 06/10/14 0705 06/11/14 0515 06/13/14 0512  WBC 8.8 7.3 10.6*  NEUTROABS 6.6  --   --   HGB 10.9* 11.0* 9.7*  HCT 34.4* 34.4* 30.8*  MCV 84.1 84.3 84.4  PLT 365 336 294    Cardiac Enzymes: No results found for this basename: CKTOTAL, CKMB, CKMBINDEX, TROPONINI,  in the last 168 hours BNP (last 3 results) No results found for this basename: PROBNP,  in the last 8760 hours   CBG: No results found for this basename: GLUCAP,  in the last 168 hours  Recent Results (from the past 240 hour(s))  SURGICAL PCR SCREEN     Status: None   Collection Time    06/11/14  1:32 AM      Result Value Ref Range Status   MRSA, PCR NEGATIVE  NEGATIVE Final   Staphylococcus aureus NEGATIVE  NEGATIVE Final   Comment:            The Xpert SA Assay (FDA     approved for NASAL specimens     in patients over 34 years of age),     is one component of     a comprehensive surveillance     program.  Test performance has     been validated by [employer] for patients greater     than or equal to 56 year old.     It is not intended     to diagnose infection nor to     guide or monitor treatment.     Studies: Dg Chest 1 View  06/10/2014   CLINICAL DATA:  Right hip pain  EXAM: CHEST - 1 VIEW  COMPARISON:  None.  FINDINGS: Normal cardiac silhouette. Lungs are  hyperinflated. Chronic bronchitic markings noted. No pulmonary edema. No infiltrate.  IMPRESSION: 1.  No acute cardiopulmonary process. 2. Hyperinflated lungs with chronic bronchitic markings.   Electronically Signed   By: Suzy Bouchard M.D.   On: 06/10/2014 07:30   Dg Hip Complete Right  06/10/2014   CLINICAL DATA:  Right hip pain.  EXAM: RIGHT HIP - COMPLETE 2+ VIEW  COMPARISON:  05/06/2014  FINDINGS: There is a new, mildly displaced right femoral neck fracture with mild varus angulation. There is no dislocation. Nondisplaced right superior and inferior pubic rami fractures are again noted with some softening of the fracture lines and evidence of early callus formation. Lower lumbar fusion is noted.  IMPRESSION: 1. New right femoral neck fracture. 2. Healing, nondisplaced right superior and inferior pubic rami fractures.   Electronically Signed   By: Logan Bores   On: 06/10/2014 07:35    Scheduled Meds: . aspirin EC  81 mg Oral Daily  . docusate sodium  100 mg Oral Daily  . enoxaparin (LOVENOX) injection  40 mg Subcutaneous Q24H  . famotidine  20 mg Oral Daily  . fluticasone  1 spray Each Nare Daily  . loratadine  10 mg Oral Daily  . magnesium hydroxide  30 mL Oral Daily  . methocarbamol  500 mg Oral Q6H  . metoprolol succinate  50 mg Oral q morning - 10a  . mometasone-formoterol  2 puff Inhalation BID  . sertraline  50 mg Oral Daily   Continuous Infusions:   Time spent: 20 minutes  [facility] Pager [phone]. If 7pm - 7AM, please contact night-coverage at [email or web address], password [facility] 06/13/2014, 1:44 PM  LOS: 3 days

## 2014-06-14 ENCOUNTER — Encounter (HOSPITAL_COMMUNITY): Payer: Self-pay | Admitting: Orthopedic Surgery

## 2014-06-14 DIAGNOSIS — M25569 Pain in unspecified knee: Secondary | ICD-10-CM | POA: Diagnosis not present

## 2014-06-14 DIAGNOSIS — Z5189 Encounter for other specified aftercare: Secondary | ICD-10-CM | POA: Diagnosis not present

## 2014-06-14 DIAGNOSIS — G4700 Insomnia, unspecified: Secondary | ICD-10-CM | POA: Diagnosis not present

## 2014-06-14 DIAGNOSIS — S72009D Fracture of unspecified part of neck of unspecified femur, subsequent encounter for closed fracture with routine healing: Secondary | ICD-10-CM | POA: Diagnosis not present

## 2014-06-14 DIAGNOSIS — J45909 Unspecified asthma, uncomplicated: Secondary | ICD-10-CM | POA: Diagnosis not present

## 2014-06-14 DIAGNOSIS — J449 Chronic obstructive pulmonary disease, unspecified: Secondary | ICD-10-CM | POA: Diagnosis not present

## 2014-06-14 DIAGNOSIS — R0602 Shortness of breath: Secondary | ICD-10-CM | POA: Diagnosis not present

## 2014-06-14 DIAGNOSIS — S72009A Fracture of unspecified part of neck of unspecified femur, initial encounter for closed fracture: Secondary | ICD-10-CM | POA: Diagnosis not present

## 2014-06-14 DIAGNOSIS — K5900 Constipation, unspecified: Secondary | ICD-10-CM | POA: Diagnosis not present

## 2014-06-14 DIAGNOSIS — S99919A Unspecified injury of unspecified ankle, initial encounter: Secondary | ICD-10-CM | POA: Diagnosis not present

## 2014-06-14 DIAGNOSIS — J309 Allergic rhinitis, unspecified: Secondary | ICD-10-CM | POA: Diagnosis not present

## 2014-06-14 DIAGNOSIS — F3289 Other specified depressive episodes: Secondary | ICD-10-CM | POA: Diagnosis not present

## 2014-06-14 DIAGNOSIS — R52 Pain, unspecified: Secondary | ICD-10-CM | POA: Diagnosis not present

## 2014-06-14 DIAGNOSIS — F329 Major depressive disorder, single episode, unspecified: Secondary | ICD-10-CM | POA: Diagnosis not present

## 2014-06-14 DIAGNOSIS — R110 Nausea: Secondary | ICD-10-CM | POA: Diagnosis not present

## 2014-06-14 DIAGNOSIS — S8990XA Unspecified injury of unspecified lower leg, initial encounter: Secondary | ICD-10-CM | POA: Diagnosis not present

## 2014-06-14 LAB — CBC
HCT: 26.5 % — ABNORMAL LOW (ref 36.0–46.0)
Hemoglobin: 8.5 g/dL — ABNORMAL LOW (ref 12.0–15.0)
MCH: 27.1 pg (ref 26.0–34.0)
MCHC: 32.1 g/dL (ref 30.0–36.0)
MCV: 84.4 fL (ref 78.0–100.0)
PLATELETS: 298 10*3/uL (ref 150–400)
RBC: 3.14 MIL/uL — AB (ref 3.87–5.11)
RDW: 15.2 % (ref 11.5–15.5)
WBC: 8.2 10*3/uL (ref 4.0–10.5)

## 2014-06-14 MED ORDER — LEVALBUTEROL HCL 0.63 MG/3ML IN NEBU: 0.6300 mg | INHALATION_SOLUTION | Freq: Four times a day (QID) | RESPIRATORY_TRACT | Status: AC | PRN

## 2014-06-14 MED ORDER — BISACODYL 10 MG RE SUPP: 10.0000 mg | Freq: Every day | RECTAL | Status: DC | PRN

## 2014-06-14 MED ORDER — ACETAMINOPHEN 325 MG PO TABS: 650.0000 mg | ORAL_TABLET | Freq: Four times a day (QID) | ORAL | Status: DC | PRN

## 2014-06-14 MED ORDER — MAGNESIUM HYDROXIDE 400 MG/5ML PO SUSP: 30.0000 mL | Freq: Every day | ORAL | Status: DC | PRN

## 2014-06-14 NOTE — Progress Notes (Signed)
Clinical Social Work Department CLINICAL SOCIAL WORK PLACEMENT NOTE 06/14/2014  Patient:  [name] [name], [name] [name]  Account Number:  [id number] Admit date:  06/10/2014  Clinical Social Worker:  Daiva Huge  Date/time:  06/12/2014 04:17 PM  Clinical Social Work is seeking post-discharge placement for this patient at the following level of care:   SKILLED NURSING   (*CSW will update this form in Epic as items are completed)   06/12/2014  Patient/family provided with [facility] Department of Clinical Social Work's list of facilities offering this level of care within the geographic area requested by the patient (or if unable, by the patient's family).  06/12/2014  Patient/family informed of their freedom to choose among providers that offer the needed level of care, that participate in Medicare, Medicaid or managed care program needed by the patient, have an available bed and are willing to accept the patient.  06/12/2014  Patient/family informed of [facility]' ownership interest in [facility], as well as of the fact that they are under no obligation to receive care at this facility.  PASARR submitted to EDS on 06/12/2014 PASARR number received on 06/12/2014  FL2 transmitted to all facilities in geographic area requested by pt/family on  06/12/2014 FL2 transmitted to all facilities within larger geographic area on   Patient informed that his/her managed care company has contracts with or will negotiate with  certain facilities, including the following:     Patient/family informed of bed offers received:  06/13/2014 Patient chooses bed at [facility] Physician recommends and patient chooses bed at    Patient to be transferred to [facility] on  06/14/2014 Patient to be transferred to facility by [facility] Patient and family notified of transfer on 06/14/2014 Name of family member notified:  DAUGHTER IN-LAW  The following physician  request were entered in Epic:   Additional Comments: Pt / family are in agreement with d/c to SNF today via [facility] transport. Non emergency transport required. Nsg reviewed d/c summary, scripts, avs. Scripts included in d/c packet.  Werner Lean LCSW [phone]

## 2014-06-14 NOTE — Discharge Instructions (Signed)
Hip Fracture A hip fracture is a fracture of the upper part of your thigh bone (femur).  CAUSES A hip fracture is caused by a direct blow to the side of your hip. This is usually the result of a fall but can occur in other circumstances, such as an automobile accident. RISK FACTORS There is an increased risk of hip fractures in people with:  An unsteady walking pattern (gait) and those with conditions that contribute to poor balance, such as Parkinson's disease or dementia.  Osteopenia and osteoporosis.  Cancer that spreads to the leg bones.  Certain metabolic diseases. SYMPTOMS  Symptoms of hip fracture include:  Pain over the injured hip.  Inability to put weight on the leg in which the fracture occurred (although, some patients are able to walk after a hip fracture).  Toes and foot of the affected leg point outward when you lie down. DIAGNOSIS A physical exam can determine if a hip fracture is likely to have occurred. X-ray exams are needed to confirm the fracture and to look for other injuries. The X-ray exam can help to determine the type of hip fracture. Rarely, the fracture is not visible on an X-ray image and a CT scan or MRI will have to be done. TREATMENT  The treatment for a fracture is usually surgery. This means using a screw, nail, or rod to hold the bones in place.  HOME CARE INSTRUCTIONS Take all medicines as directed by your health care provider. SEEK MEDICAL CARE IF: Pain continues, even after taking pain medicine. MAKE SURE YOU:  Understand these instructions.   Will watch your condition.  Will get help right away if you are not doing well or get worse. Document Released: 10/21/2005 Document Revised: 10/26/2013 Document Reviewed: 06/02/2013 [facility] Patient Information 2015 [place], [place]. This information is not intended to replace advice given to you by your health care provider. Make sure you discuss any questions you have with your health care  provider.  Partial Hip Replacement The hip joint attaches the leg to the pelvis. The joint moves and supports you when you walk. The top of the thighbone (femoral head) is in the shape of a ball. Part of the lower pelvis (acetabulum) forms a cup-shaped socket. The ball and socket are attached with bands called ligaments. Smooth tissue (cartilage) lines the surface of the ball and socket to keep the joint cushioned and to help it move. Lubricating fluid (synovial fluid) also covers joint surfaces to keep the joint healthy. A partial hip replacement most often refers to a surgery to replace only the ball part of the joint. A man-made metal material (prosthesis) replaces the worn femoral head. This surgery is often performed to alleviate the pain of arthritis or a break (fracture). It is most commonly performed for a fractured hip. LET YOUR CAREGIVER KNOW ABOUT:   Allergies to food or medicine.  Medicines taken, including vitamins, herbs, eyedrops, over-the-counter medicines, and creams.  Use of steroids (by mouth or creams).  Previous problems with anesthetics or numbing medicines.  History of bleeding problems or blood clots.  Previous surgery.  Other health problems, including diabetes and kidney problems.  Possibility of pregnancy, if this applies. RISKS AND COMPLICATIONS  As with any surgery, complications may occur. However, they can usually be managed by your caregiver. General surgical complications may include:  Reaction to anesthesia.  Damage to surrounding nerves, tissues, or structures.  Infection.  Blood clot.  Bleeding.  Scarring. Complications related to this surgery may include:  Prosthesis not staying attached to the bone.  Joint dislocation.  Continued stiffness or loss of mobility.  Continued pain. BEFORE THE PROCEDURE  It is important to follow your caregiver's instructions prior to your surgery to avoid complications. Steps before your surgery may  include:  Physical exam, blood and urine tests, X-rays, a computerized magnetic scan (MRI), bone scans, or heart tests (cardiogram or chest X-ray).  Your caregiver will review with you the surgery, the anesthesia being used, and what to expect after the surgery. You may be asked to:  Stop taking certain medicines for several days prior to your surgery, such as blood thinners (including aspirin). Your caregiver will let you know which medicines you may continue.  Lose weight.  Seek treatment for any dental conditions.  Avoid eating and drinking at least 8 hours before the surgery. This will help you avoid complications from anesthesia.  Take an antibacterial shower the night before or the morning of the surgery.  Quit smoking, if this applies. Smoking increases the chances of a healing problem after your surgery. If you are thinking about quitting, ask your surgeon how long before the surgery you should stop smoking. Ask your primary caregiver about approaches to help you stop. This can include medicines.  Arrange for someone to help you with activities during recovery. Talk with your caregiver about the need for extended care in a facility. PROCEDURE  The surgery is done after you are given medicine that makes you sleep (general anesthetic) or medicine that makes you numb from the waist down (spinal anesthetic). You will be asleep and will not feel any pain. The surgeon will make a cut (incision) in the hip to access the joint. The top of the thighbone that contains the ball is removed. The surgeon secures a new prosthetic ball joint into the healthy socket. The nearby bone may grow into the prosthesis to hold it in place, or cement (methacrylate) may be used. The incision is closed with stitches (sutures). The surgery will take several hours to complete. AFTER THE PROCEDURE   You will most likely be in the hospital for 2 to 4 days following surgery.  You will need to lie in bed and only  move as directed.  Caregivers will help you manage pain and swelling with medicines.  You will be asked to perform breathing exercises.  A splint or brace may be applied to the hip.  You will learn specific exercises and how to walk with support. This happens within hours after surgery or the following day. A physical therapist will help you.  You will need to follow your caregiver's instructions for home care after surgery.  Recovery generally takes 3 to 6 months.  You may be asked to limit how often you bend at the waist for 6 weeks. Document Released: 04/10/2010 Document Revised: 01/13/2012 Document Reviewed: 02/22/2014 [facility] Patient Information 2015 [place], [place]. This information is not intended to replace advice given to you by your health care provider. Make sure you discuss any questions you have with your health care provider.

## 2014-06-14 NOTE — Progress Notes (Signed)
   Subjective: 3 Days Post-Op Procedure(s) (LRB): RIGHT HIP HEMI ARTHROPLASTY (Right) Patient reports pain as mild.   Patient seen in rounds with Dr. Wynelle Link. She has some soreness. Patient is well, but has had some minor complaints of pain in the hip, requiring pain medications Plan is to go Skilled nursing facility after hospital stay.  Objective: Vital signs in last 24 hours: Temp:  [98.5 F (36.9 C)-99.6 F (37.6 C)] 98.7 F (37.1 C) (08/11 0520) Pulse Rate:  [73-105] 91 (08/11 0520) Resp:  [18-20] 20 (08/11 0520) BP: (102-140)/(40-64) 140/50 mmHg (08/11 0520) SpO2:  [93 %-96 %] 93 % (08/11 0857)  Intake/Output from previous day:  Intake/Output Summary (Last 24 hours) at 06/14/14 0909 Last data filed at 06/14/14 0834  Gross per 24 hour  Intake    840 ml  Output   1050 ml  Net   -210 ml    Intake/Output this shift: Total I/O In: 240 [P.O.:240] Out: 450 [Urine:450]  Labs:  Recent Labs  06/13/14 0512 06/14/14 0440  HGB 9.7* 8.5*    Recent Labs  06/13/14 0512 06/14/14 0440  WBC 10.6* 8.2  RBC 3.65* 3.14*  HCT 30.8* 26.5*  PLT 294 298    Recent Labs  06/12/14 0433 06/13/14 0512  NA 137 140  K 4.6 4.6  CL 99 100  CO2 27 31  BUN 12 12  CREATININE 0.65 0.76  GLUCOSE 158* 109*  CALCIUM 8.6 8.8   No results found for this basename: LABPT, INR,  in the last 72 hours  EXAM General - Patient is Alert and Appropriate Extremity - Neurovascular intact Sensation intact distally Dressing/Incision - clean, dry, no drainage Motor Function - intact, moving foot and toes well on exam.   Past Medical History  Diagnosis Date  . Chronic obstructive asthma, unspecified   . Allergic rhinitis, cause unspecified   . Angioneurotic edema not elsewhere classified   . Cancer 2006    lung ca  . Malignant neoplasm of bronchus and lung, unspecified site   . Environmental allergies   . Headache(784.0)   . Depression   . Arthritis   . Collapsed lung 1959    hx of  with chest tube insertion  . Complication of anesthesia     severe headache x1  . UTI (urinary tract infection)     Assessment/Plan: 3 Days Post-Op Procedure(s) (LRB): RIGHT HIP HEMI ARTHROPLASTY (Right) Principal Problem:   Hip fracture Active Problems:   ASTHMA, CHRONIC OBSTRUCTIVE NOS   S/P total knee arthroplasty   Closed stable fracture of multiple pubic rami  Estimated body mass index is 28.98 kg/(m^2) as calculated from the following:   Height as of this encounter: 5\' 2"  (1.575 m).   Weight as of this encounter: 71.895 kg (158 lb 8 oz). Plan for discharge tomorrow Discharge to SNF when ready RXs placed on chart F/U in two weeks on Friday the 21st of August WBAT to right leg DVT Prophylaxis - Lovenox Weight Bearing As Tolerated right Leg  Arlee Muslim, PA-C Orthopaedic Surgery 06/14/2014, 9:09 AM

## 2014-06-14 NOTE — Discharge Summary (Signed)
Physician Discharge Summary  [name] [name] ALP:[id number] DOB: 10-10-1927 DOA: 06/10/2014  PCP: Wenda Low, MD  Admit date: 06/10/2014 Discharge date: 06/14/2014  Time spent: greater than 30 minutes  Recommendations for Outpatient Follow-up:  1. Continue PT, OT  Discharge Diagnoses:  Principal Problem:   Hip fracture Active Problems:   ASTHMA, CHRONIC OBSTRUCTIVE NOS   S/P total knee arthroplasty   Closed stable fracture of multiple pubic rami  Discharge Condition: stable  Filed Weights   06/11/14 1250  Weight: 71.895 kg (158 lb 8 oz)    History of present illness:  80 y.o. female medical history significant for pelvic fracture, status post left total knee arthroplasty in May of 2015 who was residing at [facility] and presents with above complaints. She reports that early this a.m. she got up to go to the bathroom and when she turned around to get back into the wheelchair "something happened", and when she sat down in the wheelchair that she began having severe pain in her right hip. She denies any falls. She was seen in the ED and x-ray revealed no right femoral neck fracture, and the healing nondisplaced right superior and inferior pubic rami fractures were noted. Orthopedics was consulted per EDP and she is admitted to [facility] for further evaluation and management.  Hospital Course:  admittted to hospitalists. Orthopedics consulted. Dr. Maureen Ralphs performed right hip hemiarthoplasty.  Patient did well postoperatively.  Asthma has been stable, but remains on oxygen for sats in the high 80s on room air. Received lovenox for DVT prophylaxis. Bowel regimen.  Procedures:  Right hip hemiarthroplasty by Dr. Maureen Ralphs 06/11/14  Consultations:  orthopedics  Discharge Exam: Filed Vitals:   06/14/14 0934  BP: 108/65  Pulse: 101  Temp:   Resp:     General: alert, oriented, in chair ordering lunch Cardiovascular: RRR without MGR Respiratory: CTA without WRR abd s, nt, nd. Normal bowel  sounds Ext no CCE  Discharge Instructions   Diet general    Complete by:  As directed      Weight bearing as tolerated    Complete by:  As directed   Laterality:  right  Extremity:  Lower     Weight bearing as tolerated    Complete by:  As directed   Laterality:  right  Extremity:  Lower            Medication List    STOP taking these medications       aspirin EC 81 MG tablet      TAKE these medications       acetaminophen 325 MG tablet  Commonly known as:  TYLENOL  Take 2 tablets (650 mg total) by mouth every 6 (six) hours as needed for mild pain (or Fever >/= 101).     albuterol 108 (90 BASE) MCG/ACT inhaler  Commonly known as:  PROVENTIL HFA;VENTOLIN HFA  Inhale 1 puff into the lungs every 6 (six) hours as needed for wheezing or shortness of breath.     bisacodyl 10 MG suppository  Commonly known as:  DULCOLAX  Place 1 suppository (10 mg total) rectally daily as needed for moderate constipation.     diazepam 5 MG tablet  Commonly known as:  VALIUM  Take 2.5 mg by mouth at bedtime as needed (for sleep).     docusate sodium 100 MG capsule  Commonly known as:  COLACE  Take 100 mg by mouth daily.     enoxaparin 40 MG/0.4ML injection  Commonly known as:  LOVENOX  Inject 0.4 mLs (40 mg total) into the skin daily.     famotidine 20 MG tablet  Commonly known as:  PEPCID  Take 20 mg by mouth daily.     Fluticasone-Salmeterol 250-50 MCG/DOSE Aepb  Commonly known as:  ADVAIR  Inhale 1 puff into the lungs 2 (two) times daily.     iron polysaccharides 150 MG capsule  Commonly known as:  NIFEREX  Take 1 capsule (150 mg total) by mouth daily.     levalbuterol 0.63 MG/3ML nebulizer solution  Commonly known as:  XOPENEX  Take 3 mLs (0.63 mg total) by nebulization every 6 (six) hours as needed for wheezing or shortness of breath.     loratadine 10 MG tablet  Commonly known as:  CLARITIN  Take 10 mg by mouth daily.     magnesium hydroxide 400 MG/5ML suspension   Commonly known as:  MILK OF MAGNESIA  Take 30 mLs by mouth daily as needed for mild constipation.     methocarbamol 500 MG tablet  Commonly known as:  ROBAXIN  Take 1 tablet (500 mg total) by mouth every 6 (six) hours.     metoCLOPramide 5 MG tablet  Commonly known as:  REGLAN  Take 1 tablet (5 mg total) by mouth every 8 (eight) hours as needed for nausea (if ondansetron (ZOFRAN) ineffective.).     metoprolol succinate 50 MG 24 hr tablet  Commonly known as:  TOPROL-XL  Take 50 mg by mouth every morning.     mometasone 50 MCG/ACT nasal spray  Commonly known as:  NASONEX  Place 2 sprays into the nose daily.     ondansetron 4 MG tablet  Commonly known as:  ZOFRAN  Take 1 tablet (4 mg total) by mouth every 6 (six) hours as needed for nausea.     oxyCODONE 5 MG immediate release tablet  Commonly known as:  Oxy IR/ROXICODONE  Take 1-2 tablets (5-10 mg total) by mouth every 3 (three) hours as needed for moderate pain, severe pain or breakthrough pain.     polyethylene glycol packet  Commonly known as:  MIRALAX / GLYCOLAX  Take 17 g by mouth daily as needed for mild constipation or moderate constipation.     sertraline 50 MG tablet  Commonly known as:  ZOLOFT  Take 50 mg by mouth daily.       Allergies  Allergen Reactions  . Aspirin     REACTION: GI upset in large amounts  . Codeine Sulfate     REACTION: GI upset  . Penicillins Rash  . Septra Ds [Sulfamethoxazole-Trimethoprim] Rash       Follow-up Information   Follow up with Gearlean Alf, MD On 06/24/2014.   Specialty:  Orthopedic Surgery   Contact information:   [address] [place] [address] [phone]        The results of significant diagnostics from this hospitalization (including imaging, microbiology, ancillary and laboratory) are listed below for reference.    Significant Diagnostic Studies: Dg Chest 1 View  06/10/2014   CLINICAL DATA:  Right hip pain  EXAM: CHEST - 1 VIEW   COMPARISON:  None.  FINDINGS: Normal cardiac silhouette. Lungs are hyperinflated. Chronic bronchitic markings noted. No pulmonary edema. No infiltrate.  IMPRESSION: 1.  No acute cardiopulmonary process. 2. Hyperinflated lungs with chronic bronchitic markings.   Electronically Signed   By: Suzy Bouchard M.D.   On: 06/10/2014 07:30   Dg Hip Complete Right  06/10/2014   CLINICAL DATA:  Right hip pain.  EXAM: RIGHT HIP - COMPLETE 2+ VIEW  COMPARISON:  05/06/2014  FINDINGS: There is a new, mildly displaced right femoral neck fracture with mild varus angulation. There is no dislocation. Nondisplaced right superior and inferior pubic rami fractures are again noted with some softening of the fracture lines and evidence of early callus formation. Lower lumbar fusion is noted.  IMPRESSION: 1. New right femoral neck fracture. 2. Healing, nondisplaced right superior and inferior pubic rami fractures.   Electronically Signed   By: Logan Bores   On: 06/10/2014 07:35   Dg Pelvis Portable  06/11/2014   CLINICAL DATA:  Postop right hip replacement  EXAM: PORTABLE PELVIS 1-2 VIEWS  COMPARISON:  None.  FINDINGS: There is a total right hip replacement with components in anticipated position. Moderate left hip joint space narrowing. There is an oblique lucency extending from the superior pubic ramus inferiorly and laterally through the pubic symphysis and into the inferior pubic ramus. No significant displacement identified.  IMPRESSION: Right hip replacement. Nondisplaced fracture superior and inferior pubic rami.   Electronically Signed   By: Skipper Cliche M.D.   On: 06/11/2014 18:28    Microbiology: Recent Results (from the past 240 hour(s))  SURGICAL PCR SCREEN     Status: None   Collection Time    06/11/14  1:32 AM      Result Value Ref Range Status   MRSA, PCR NEGATIVE  NEGATIVE Final   Staphylococcus aureus NEGATIVE  NEGATIVE Final   Comment:            The Xpert SA Assay (FDA     approved for NASAL  specimens     in patients over 50 years of age),     is one component of     a comprehensive surveillance     program.  Test performance has     been validated by [employer] for patients greater     than or equal to 82 year old.     It is not intended     to diagnose infection nor to     guide or monitor treatment.     Labs: Basic Metabolic Panel:  Recent Labs Lab 06/10/14 0705 06/11/14 0515 06/12/14 0433 06/13/14 0512  NA 138 139 137 140  K 4.5 4.3 4.6 4.6  CL 99 101 99 100  CO2 27 27 27 31   GLUCOSE 113* 107* 158* 109*  BUN 14 8 12 12   CREATININE 0.65 0.66 0.65 0.76  CALCIUM 9.1 8.5 8.6 8.8   Liver Function Tests:  Recent Labs Lab 06/12/14 0433  AST 89*  ALT 71*  ALKPHOS 158*  BILITOT 0.3  PROT 5.6*  ALBUMIN 2.6*   No results found for this basename: LIPASE, AMYLASE,  in the last 168 hours No results found for this basename: AMMONIA,  in the last 168 hours CBC:  Recent Labs Lab 06/10/14 0705 06/11/14 0515 06/13/14 0512 06/14/14 0440  WBC 8.8 7.3 10.6* 8.2  NEUTROABS 6.6  --   --   --   HGB 10.9* 11.0* 9.7* 8.5*  HCT 34.4* 34.4* 30.8* 26.5*  MCV 84.1 84.3 84.4 84.4  PLT 365 336 294 298    Signed:  Shali Vesey L  [facility] 06/14/2014, 11:51 AM

## 2014-06-14 NOTE — Progress Notes (Signed)
Physical Therapy Treatment Patient Details Name: BRESHAE BELCHER MRN: [id number] DOB: [birth year] Today's Date: 06/14/2014    History of Present Illness 80 yo female s/p R hip hemi 06/11/14. Hx of L TKA, lung cancer, asthma. Pt is from SNF.     PT Comments     POD # 3 pt OOB in recliner.  Assisted to [facility] + 2 assit and increase time and 75% VC's on proper safe tech.  Assisted with amb + 2 limited distance.      Follow Up Recommendations  SNF     Equipment Recommendations       Recommendations for Other Services       Precautions / Restrictions Precautions Precautions: Posterior Hip;Fall Precaution Comments: pt recalls 0/3 THP so re educated Restrictions Weight Bearing Restrictions: No RLE Weight Bearing: Weight bearing as tolerated    Mobility  Bed Mobility               General bed mobility comments: Pt OOB in recliner  Transfers Overall transfer level: Needs assistance Equipment used: Rolling walker (2 wheeled) Transfers: Sit to/from Stand Sit to Stand: Mod assist;+2 physical assistance;+2 safety/equipment;From elevated surface         General transfer comment: assist to rise, stabilize, control descent. Multimodal cues for safety, technique, hand placement.   Assisted on/off  BSC.  Ambulation/Gait Ambulation/Gait assistance: +2 physical assistance;Mod assist Ambulation Distance (Feet): 11 Feet Assistive device: Rolling walker (2 wheeled) Gait Pattern/deviations: Step-to pattern;Decreased stride length;Decreased stance time - right Gait velocity: decreased   General Gait Details: pt required increased time and 75% VC's on proper tech and hand placement.  Amb on RA sats avg 94% but increased resp rate 33.  Limited amb distance.  Recliner to follow.   Stairs            Wheelchair Mobility    Modified Rankin (Stroke Patients Only)       Balance                                    Cognition                            Exercises      General Comments        Pertinent Vitals/Pain      Home Living                      Prior Function            PT Goals (current goals can now be found in the care plan section) Progress towards PT goals: Progressing toward goals    Frequency  Min 3X/week    PT Plan      Co-evaluation             End of Session Equipment Utilized During Treatment: Gait belt Activity Tolerance: Patient limited by fatigue;Patient limited by pain Patient left: in chair;with call bell/phone within reach     Time: 1055-1126 PT Time Calculation (min): 31 min  Charges:  $Gait Training: 8-22 mins $Therapeutic Activity: 8-22 mins                    G Codes:      Rica Koyanagi  PTA [facility]  Acute  Rehab Pager      [phone]

## 2014-06-15 DIAGNOSIS — S72009D Fracture of unspecified part of neck of unspecified femur, subsequent encounter for closed fracture with routine healing: Secondary | ICD-10-CM | POA: Diagnosis not present

## 2014-06-15 DIAGNOSIS — J45909 Unspecified asthma, uncomplicated: Secondary | ICD-10-CM | POA: Diagnosis not present

## 2014-07-07 DIAGNOSIS — Z7982 Long term (current) use of aspirin: Secondary | ICD-10-CM | POA: Diagnosis not present

## 2014-07-07 DIAGNOSIS — Z471 Aftercare following joint replacement surgery: Secondary | ICD-10-CM | POA: Diagnosis not present

## 2014-07-07 DIAGNOSIS — S3282XD Multiple fractures of pelvis without disruption of pelvic ring, subsequent encounter for fracture with routine healing: Secondary | ICD-10-CM | POA: Diagnosis not present

## 2014-07-07 DIAGNOSIS — Z96652 Presence of left artificial knee joint: Secondary | ICD-10-CM | POA: Diagnosis not present

## 2014-07-07 DIAGNOSIS — Z96641 Presence of right artificial hip joint: Secondary | ICD-10-CM | POA: Diagnosis not present

## 2014-07-07 DIAGNOSIS — J449 Chronic obstructive pulmonary disease, unspecified: Secondary | ICD-10-CM | POA: Diagnosis not present

## 2014-07-08 DIAGNOSIS — Z96641 Presence of right artificial hip joint: Secondary | ICD-10-CM | POA: Diagnosis not present

## 2014-07-08 DIAGNOSIS — Z96652 Presence of left artificial knee joint: Secondary | ICD-10-CM | POA: Diagnosis not present

## 2014-07-08 DIAGNOSIS — Z7982 Long term (current) use of aspirin: Secondary | ICD-10-CM | POA: Diagnosis not present

## 2014-07-08 DIAGNOSIS — S3282XD Multiple fractures of pelvis without disruption of pelvic ring, subsequent encounter for fracture with routine healing: Secondary | ICD-10-CM | POA: Diagnosis not present

## 2014-07-08 DIAGNOSIS — Z471 Aftercare following joint replacement surgery: Secondary | ICD-10-CM | POA: Diagnosis not present

## 2014-07-08 DIAGNOSIS — J449 Chronic obstructive pulmonary disease, unspecified: Secondary | ICD-10-CM | POA: Diagnosis not present

## 2014-07-12 DIAGNOSIS — J449 Chronic obstructive pulmonary disease, unspecified: Secondary | ICD-10-CM | POA: Diagnosis not present

## 2014-07-12 DIAGNOSIS — Z96652 Presence of left artificial knee joint: Secondary | ICD-10-CM | POA: Diagnosis not present

## 2014-07-12 DIAGNOSIS — Z96641 Presence of right artificial hip joint: Secondary | ICD-10-CM | POA: Diagnosis not present

## 2014-07-12 DIAGNOSIS — Z471 Aftercare following joint replacement surgery: Secondary | ICD-10-CM | POA: Diagnosis not present

## 2014-07-12 DIAGNOSIS — S3282XD Multiple fractures of pelvis without disruption of pelvic ring, subsequent encounter for fracture with routine healing: Secondary | ICD-10-CM | POA: Diagnosis not present

## 2014-07-12 DIAGNOSIS — Z7982 Long term (current) use of aspirin: Secondary | ICD-10-CM | POA: Diagnosis not present

## 2014-07-14 DIAGNOSIS — Z7982 Long term (current) use of aspirin: Secondary | ICD-10-CM | POA: Diagnosis not present

## 2014-07-14 DIAGNOSIS — S3282XD Multiple fractures of pelvis without disruption of pelvic ring, subsequent encounter for fracture with routine healing: Secondary | ICD-10-CM | POA: Diagnosis not present

## 2014-07-14 DIAGNOSIS — Z96641 Presence of right artificial hip joint: Secondary | ICD-10-CM | POA: Diagnosis not present

## 2014-07-14 DIAGNOSIS — Z96652 Presence of left artificial knee joint: Secondary | ICD-10-CM | POA: Diagnosis not present

## 2014-07-14 DIAGNOSIS — Z471 Aftercare following joint replacement surgery: Secondary | ICD-10-CM | POA: Diagnosis not present

## 2014-07-14 DIAGNOSIS — J449 Chronic obstructive pulmonary disease, unspecified: Secondary | ICD-10-CM | POA: Diagnosis not present

## 2014-07-19 DIAGNOSIS — Z7982 Long term (current) use of aspirin: Secondary | ICD-10-CM | POA: Diagnosis not present

## 2014-07-19 DIAGNOSIS — S3282XD Multiple fractures of pelvis without disruption of pelvic ring, subsequent encounter for fracture with routine healing: Secondary | ICD-10-CM | POA: Diagnosis not present

## 2014-07-19 DIAGNOSIS — Z96641 Presence of right artificial hip joint: Secondary | ICD-10-CM | POA: Diagnosis not present

## 2014-07-19 DIAGNOSIS — Z471 Aftercare following joint replacement surgery: Secondary | ICD-10-CM | POA: Diagnosis not present

## 2014-07-19 DIAGNOSIS — J449 Chronic obstructive pulmonary disease, unspecified: Secondary | ICD-10-CM | POA: Diagnosis not present

## 2014-07-19 DIAGNOSIS — Z96652 Presence of left artificial knee joint: Secondary | ICD-10-CM | POA: Diagnosis not present

## 2014-07-19 DIAGNOSIS — Z96649 Presence of unspecified artificial hip joint: Secondary | ICD-10-CM | POA: Diagnosis not present

## 2014-07-21 DIAGNOSIS — S3282XD Multiple fractures of pelvis without disruption of pelvic ring, subsequent encounter for fracture with routine healing: Secondary | ICD-10-CM | POA: Diagnosis not present

## 2014-07-21 DIAGNOSIS — Z7982 Long term (current) use of aspirin: Secondary | ICD-10-CM | POA: Diagnosis not present

## 2014-07-21 DIAGNOSIS — J449 Chronic obstructive pulmonary disease, unspecified: Secondary | ICD-10-CM | POA: Diagnosis not present

## 2014-07-21 DIAGNOSIS — Z96652 Presence of left artificial knee joint: Secondary | ICD-10-CM | POA: Diagnosis not present

## 2014-07-21 DIAGNOSIS — Z471 Aftercare following joint replacement surgery: Secondary | ICD-10-CM | POA: Diagnosis not present

## 2014-07-21 DIAGNOSIS — Z96641 Presence of right artificial hip joint: Secondary | ICD-10-CM | POA: Diagnosis not present

## 2014-07-26 DIAGNOSIS — Z7982 Long term (current) use of aspirin: Secondary | ICD-10-CM | POA: Diagnosis not present

## 2014-07-26 DIAGNOSIS — J449 Chronic obstructive pulmonary disease, unspecified: Secondary | ICD-10-CM | POA: Diagnosis not present

## 2014-07-26 DIAGNOSIS — Z96641 Presence of right artificial hip joint: Secondary | ICD-10-CM | POA: Diagnosis not present

## 2014-07-26 DIAGNOSIS — Z96652 Presence of left artificial knee joint: Secondary | ICD-10-CM | POA: Diagnosis not present

## 2014-07-26 DIAGNOSIS — Z471 Aftercare following joint replacement surgery: Secondary | ICD-10-CM | POA: Diagnosis not present

## 2014-07-26 DIAGNOSIS — S3282XD Multiple fractures of pelvis without disruption of pelvic ring, subsequent encounter for fracture with routine healing: Secondary | ICD-10-CM | POA: Diagnosis not present

## 2014-07-28 DIAGNOSIS — Z96652 Presence of left artificial knee joint: Secondary | ICD-10-CM | POA: Diagnosis not present

## 2014-07-28 DIAGNOSIS — J449 Chronic obstructive pulmonary disease, unspecified: Secondary | ICD-10-CM | POA: Diagnosis not present

## 2014-07-28 DIAGNOSIS — Z96641 Presence of right artificial hip joint: Secondary | ICD-10-CM | POA: Diagnosis not present

## 2014-07-28 DIAGNOSIS — S3282XD Multiple fractures of pelvis without disruption of pelvic ring, subsequent encounter for fracture with routine healing: Secondary | ICD-10-CM | POA: Diagnosis not present

## 2014-07-28 DIAGNOSIS — Z471 Aftercare following joint replacement surgery: Secondary | ICD-10-CM | POA: Diagnosis not present

## 2014-07-28 DIAGNOSIS — Z7982 Long term (current) use of aspirin: Secondary | ICD-10-CM | POA: Diagnosis not present

## 2014-07-29 ENCOUNTER — Ambulatory Visit: Payer: Medicare Other | Admitting: Internal Medicine

## 2014-08-02 DIAGNOSIS — J449 Chronic obstructive pulmonary disease, unspecified: Secondary | ICD-10-CM | POA: Diagnosis not present

## 2014-08-02 DIAGNOSIS — Z96641 Presence of right artificial hip joint: Secondary | ICD-10-CM | POA: Diagnosis not present

## 2014-08-02 DIAGNOSIS — Z471 Aftercare following joint replacement surgery: Secondary | ICD-10-CM | POA: Diagnosis not present

## 2014-08-02 DIAGNOSIS — S3282XD Multiple fractures of pelvis without disruption of pelvic ring, subsequent encounter for fracture with routine healing: Secondary | ICD-10-CM | POA: Diagnosis not present

## 2014-08-02 DIAGNOSIS — Z96652 Presence of left artificial knee joint: Secondary | ICD-10-CM | POA: Diagnosis not present

## 2014-08-02 DIAGNOSIS — Z7982 Long term (current) use of aspirin: Secondary | ICD-10-CM | POA: Diagnosis not present

## 2014-08-04 DIAGNOSIS — S3282XD Multiple fractures of pelvis without disruption of pelvic ring, subsequent encounter for fracture with routine healing: Secondary | ICD-10-CM | POA: Diagnosis not present

## 2014-08-04 DIAGNOSIS — Z7982 Long term (current) use of aspirin: Secondary | ICD-10-CM | POA: Diagnosis not present

## 2014-08-04 DIAGNOSIS — Z96652 Presence of left artificial knee joint: Secondary | ICD-10-CM | POA: Diagnosis not present

## 2014-08-04 DIAGNOSIS — Z96641 Presence of right artificial hip joint: Secondary | ICD-10-CM | POA: Diagnosis not present

## 2014-08-04 DIAGNOSIS — J449 Chronic obstructive pulmonary disease, unspecified: Secondary | ICD-10-CM | POA: Diagnosis not present

## 2014-08-04 DIAGNOSIS — Z471 Aftercare following joint replacement surgery: Secondary | ICD-10-CM | POA: Diagnosis not present

## 2014-08-08 ENCOUNTER — Other Ambulatory Visit: Payer: Self-pay | Admitting: Internal Medicine

## 2014-08-09 DIAGNOSIS — Z7982 Long term (current) use of aspirin: Secondary | ICD-10-CM | POA: Diagnosis not present

## 2014-08-09 DIAGNOSIS — Z96641 Presence of right artificial hip joint: Secondary | ICD-10-CM | POA: Diagnosis not present

## 2014-08-09 DIAGNOSIS — J449 Chronic obstructive pulmonary disease, unspecified: Secondary | ICD-10-CM | POA: Diagnosis not present

## 2014-08-09 DIAGNOSIS — Z96652 Presence of left artificial knee joint: Secondary | ICD-10-CM | POA: Diagnosis not present

## 2014-08-09 DIAGNOSIS — S3282XD Multiple fractures of pelvis without disruption of pelvic ring, subsequent encounter for fracture with routine healing: Secondary | ICD-10-CM | POA: Diagnosis not present

## 2014-08-09 DIAGNOSIS — Z471 Aftercare following joint replacement surgery: Secondary | ICD-10-CM | POA: Diagnosis not present

## 2014-08-11 ENCOUNTER — Telehealth: Payer: Self-pay | Admitting: Internal Medicine

## 2014-08-11 DIAGNOSIS — S3282XD Multiple fractures of pelvis without disruption of pelvic ring, subsequent encounter for fracture with routine healing: Secondary | ICD-10-CM | POA: Diagnosis not present

## 2014-08-11 DIAGNOSIS — Z96652 Presence of left artificial knee joint: Secondary | ICD-10-CM | POA: Diagnosis not present

## 2014-08-11 DIAGNOSIS — Z96641 Presence of right artificial hip joint: Secondary | ICD-10-CM | POA: Diagnosis not present

## 2014-08-11 DIAGNOSIS — Z471 Aftercare following joint replacement surgery: Secondary | ICD-10-CM | POA: Diagnosis not present

## 2014-08-11 DIAGNOSIS — J449 Chronic obstructive pulmonary disease, unspecified: Secondary | ICD-10-CM | POA: Diagnosis not present

## 2014-08-11 DIAGNOSIS — Z7982 Long term (current) use of aspirin: Secondary | ICD-10-CM | POA: Diagnosis not present

## 2014-08-11 NOTE — Telephone Encounter (Signed)
Called pt to see if she needs nasonex refilled. She reports she does not and does not want alternative called in. She reports she has 4 bottles of Nasonex. I called [facility]. Spoke with Leonardo. Made him of aware of pt had stated. He noted this in her chart. Nothing further needed

## 2014-08-12 ENCOUNTER — Encounter: Payer: Self-pay | Admitting: Internal Medicine

## 2014-08-12 ENCOUNTER — Ambulatory Visit: Payer: Medicare Other | Admitting: Internal Medicine

## 2014-09-09 ENCOUNTER — Encounter: Payer: Self-pay | Admitting: Internal Medicine

## 2014-10-04 DIAGNOSIS — Z23 Encounter for immunization: Secondary | ICD-10-CM | POA: Diagnosis not present

## 2014-10-14 ENCOUNTER — Encounter: Payer: Self-pay | Admitting: Internal Medicine

## 2014-10-14 ENCOUNTER — Ambulatory Visit (INDEPENDENT_AMBULATORY_CARE_PROVIDER_SITE_OTHER): Payer: Medicare Other | Admitting: Internal Medicine

## 2014-10-14 ENCOUNTER — Encounter (INDEPENDENT_AMBULATORY_CARE_PROVIDER_SITE_OTHER): Payer: Self-pay

## 2014-10-14 VITALS — BP 122/78 | HR 69 | Ht 62.0 in | Wt 150.0 lb

## 2014-10-14 DIAGNOSIS — J449 Chronic obstructive pulmonary disease, unspecified: Secondary | ICD-10-CM | POA: Diagnosis not present

## 2014-10-14 DIAGNOSIS — J4489 Other specified chronic obstructive pulmonary disease: Secondary | ICD-10-CM

## 2014-10-14 DIAGNOSIS — J301 Allergic rhinitis due to pollen: Secondary | ICD-10-CM | POA: Diagnosis not present

## 2014-10-14 DIAGNOSIS — C3411 Malignant neoplasm of upper lobe, right bronchus or lung: Secondary | ICD-10-CM | POA: Diagnosis not present

## 2014-10-14 NOTE — Progress Notes (Signed)
Patient ID: [name] [name], female    DOB: June 13, 1927, 80 y.o.   MRN: [id number]  HPI 07/01/11- 80 year old female former smoker (100 pack years ) followed for asthma/COPD, allergic rhinitis, chronic sinusitis, remote history lung cancer Last here - January 15, 2011,  Went to her primary office at [facility] for viral illness in June. Had a lot of shortness of breath while in rehab at her neurosurgery office for followup of back pain. Pools liquid in her mouth. Denies easy choking with swallowing; denies smothering when lying down at night.  Continues allergy vaccine. This week using rescue inhaler several times/ day- does help. Advair 250. Finishes prednisone taper in 3 more days- it did help a lot CT chest 11/23/10- RULobectomy, emphysema, NAD.  Plan 6 MWT next visit.   01/24/12-  80 year old female former smoker (100 pack years ) followed for asthma/COPD, allergic rhinitis, chronic sinusitis, remote history lung cancer Has had nerve blocks in cervical spine to relieve occipital pain. No respiratory complaints associated with that. Breathing overall is better. Continues allergy vaccine here at 1:10 without problem. 6 minute walk test today was reviewed with her: 97%, 98%, 97%. Walk only 168 m. Stopped at 3 minutes complaining of dizziness and weakness but able to restart after a short rest. Impression: Limited exercise tolerance with complaints of dizziness and weakness not reflected in blood pressure, heart rate or oxygen levels.  07/28/12- 80 year old female former smoker (100 pack years ) followed for asthma/COPD, allergic rhinitis, chronic sinusitis, remote history lung cancer Still on vaccine 1:10 GH and doing pretty well; ragweed is bothersome at this time. But not much nasal congestion or nose blowing. Mild cough when her lawn is mowed. Being followed by oncology for left upper lobe nodule with CT scan next scheduled June 2014. She feels she is better off continuing Advair 500. Still having  pains in the face and head she associates with pinched nerves in her neck CT chest 04/24/12-we reviewed images IMPRESSION:  1. Status post right upper lobectomy without definite evidence of  local recurrence of disease or new metastatic disease in the  thorax.  2. However, there is a new 4 mm left upper lobe nodule (image 16  of series 5) which is highly nonspecific. However, attention on a  follow-up CT scan in 1 year is recommended. This recommendation  follows the consensus statement: Guidelines for Management of Small  Pulmonary Nodules Detected on CT Scans: A Statement from the  [facility] as published in Radiology 2005; 237:395-400.  3. Atherosclerosis, including left anterior descending and right  coronary artery disease. Please note that although the presence of  coronary artery calcium documents the presence of coronary artery  disease, the severity of this disease and any potential stenosis  cannot be assessed on this non-gated CT examination. Assessment  for potential risk factor modification, dietary therapy or  pharmacologic therapy may be warranted, if clinically indicated.  Original Report Authenticated By: Etheleen Mayhew, M.D.   [age]- 80 year old female former smoker (100 pack years ) followed for asthma/COPD, allergic rhinitis, chronic sinusitis, remote history lung cancer/ RULobectomy, lung nodule/ Oncology f/u FOLLOWS GHW:EXHBZ on allergy vaccine 1:10 GH and doing well; denies any flare ups at this time. Feels well controlled now. Using Nasonex. Always somewhat hoarse. Has continued Advair 500 and feels chest is very stable without cough or wheeze.  07/26/13- 80 year old female former smoker (100 pack years ) followed for asthma/COPD, allergic rhinitis, chronic sinusitis, remote history lung cancer/ RULobectomy, lung  nodule/ Oncology f/u FOLLOWS FOR: still on Allergy vaccine 1:10 GO; slightly congested-started using inhaler again. Also slight audible  wheezing. Had flu vaccine. With season change, notices mild hoarseness and more labored breathing with increased use of rescue inhaler. Heart burn wakes her up with famotidine once daily. Pending total knee replacement. Lung nodule was stable - f/u Dr Gwynneth Albright CT 04/30/13 IMPRESSION:  1. Grossly stable postoperative appearance of the chest without  evidence of local recurrence or definite metastatic disease.  2. Scattered small pulmonary nodules are again noted with a new 4  mm nodule in the superior segment of the left lower lobe. The  additional nodules are stable to improved. This new nodule is  nonspecific; continued CT followup should be considered given the  patient's history. This recommendation follows the consensus  statement: Guidelines for Management of Small Pulmonary Nodules  Detected on CT Scans: A Statement from the [place] as  published in Radiology 2005; 237:395-400.  Original Report Authenticated By: Richardean Sale, M.D.  01/25/14- 80 year old female former smoker (100 pack years ) followed for asthma/COPD, allergic rhinitis, chronic sinusitis, remote history lung cancer/ RULobectomy, lung nodule/ Oncology f/u FOLLOWS FOR: still on Allergy vaccine 1:10 GH and doing well. Had recent flare up of coughing and sneezing for 2 days. Pt had PFT 08-2013- results being sought. Not much cough now. Being evaluated for L TKR in May. Needs to change rescue inhaler for insurance. PFT 08/12/13- Mild obstructive airways disease  10/14/14- 80 year old female former smoker (100 pack years ) followed for asthma/COPD, allergic rhinitis, chronic sinusitis, remote history lung cancer/ RULobectomy, lung nodule/ Oncology f/u no new problems Hospitalized in August for pelvic fracture after a fall/hip replacement. Had been missing allergy shots while in hospital and expects to go back again. We discussed stopping allergy vaccine now. CXR 1V 06/10/14 IMPRESSION: 1. No acute  cardiopulmonary process. 2. Hyperinflated lungs with chronic bronchitic markings. Electronically Signed  By: Suzy Bouchard M.D.  On: 06/10/2014 07:30  Review of Systems-see HPI Constitutional:   No-   weight loss, night sweats, fevers, chills, fatigue, lassitude. HEENT:   + headaches, no-difficulty swallowing, tooth/dental problems, sore throat,       No-  sneezing, itching, ear ache, nasal congestion, post nasal drip,  CV:  No-   chest pain, orthopnea, PND, swelling in lower extremities, anasarca, dizziness, palpitations Resp: +   shortness of breath with exertion or at rest.              No-   productive cough,  +non-productive cough,  No-  coughing up of blood.              No-   change in color of mucus.  No- wheezing.   Skin: No-   rash or lesions. GI:  No-   heartburn, indigestion, abdominal pain, nausea, vomiting,  GU:   MS:  No-   joint pain or swelling.  .  + back pain. Neuro- weak and dizzy with exertion Psych:  No- change in mood or affect. No depression or anxiety.  No memory loss.  Objective:   Physical Exam General- Alert, Oriented, Affect-appropriate, Distress- nonacute Skin- +stasis change, pretibial, L>R Lymphadenopathy- none Head- atraumatic            Eyes- Gross vision intact, PERRLA, conjunctivae clear secretions            Ears- Hearing, canals normal            Nose- Clear, No-Septal dev, mucus, polyps,  erosion, perforation             Throat- Mallampati III , mucosa clear , drainage- none, tonsils- atrophic.  Neck- flexible , trachea midline, no stridor , thyroid nl, carotid no bruit Chest - symmetrical excursion , unlabored           Heart/CV- RRR , no murmur , no gallop  , no rub, nl s1 s2  Pulse is normal and regular.                            JVD- none , edema- none, stasis changes- none, varices- none           Lung- +decreased sounds in bases, wheeze- none, cough- none , dullness-none, rub-  none            Chest wall-  Abd-  Br/ Gen/ Rectal-  Not done, not indicated Extrem- cyanosis- none, clubbing, none, atrophy- none, strength- nl Neuro- right facial tic

## 2014-10-14 NOTE — Assessment & Plan Note (Signed)
Continued remission with surveillance

## 2014-10-14 NOTE — Assessment & Plan Note (Signed)
Controlled on current regimen. She is very fortunate not to have more COPD with her smoking history.

## 2014-10-14 NOTE — Patient Instructions (Signed)
We are stopping allergy shots now. We will watch to see how you do.   Please call as needed

## 2014-10-14 NOTE — Assessment & Plan Note (Signed)
It is appropriate now to stop allergy vaccine for observation and conservative management. We discussed this and she agrees.

## 2014-10-24 DIAGNOSIS — S81802A Unspecified open wound, left lower leg, initial encounter: Secondary | ICD-10-CM | POA: Diagnosis not present

## 2014-10-24 DIAGNOSIS — M810 Age-related osteoporosis without current pathological fracture: Secondary | ICD-10-CM | POA: Diagnosis not present

## 2014-10-24 DIAGNOSIS — Z23 Encounter for immunization: Secondary | ICD-10-CM | POA: Diagnosis not present

## 2014-10-24 DIAGNOSIS — N183 Chronic kidney disease, stage 3 (moderate): Secondary | ICD-10-CM | POA: Diagnosis not present

## 2014-10-24 DIAGNOSIS — I10 Essential (primary) hypertension: Secondary | ICD-10-CM | POA: Diagnosis not present

## 2014-10-24 DIAGNOSIS — D649 Anemia, unspecified: Secondary | ICD-10-CM | POA: Diagnosis not present

## 2014-10-24 DIAGNOSIS — E780 Pure hypercholesterolemia: Secondary | ICD-10-CM | POA: Diagnosis not present

## 2014-10-24 DIAGNOSIS — R159 Full incontinence of feces: Secondary | ICD-10-CM | POA: Diagnosis not present

## 2014-10-24 DIAGNOSIS — J449 Chronic obstructive pulmonary disease, unspecified: Secondary | ICD-10-CM | POA: Diagnosis not present

## 2014-10-31 DIAGNOSIS — S81802A Unspecified open wound, left lower leg, initial encounter: Secondary | ICD-10-CM | POA: Diagnosis not present

## 2014-11-02 DIAGNOSIS — Z85110 Personal history of malignant carcinoid tumor of bronchus and lung: Secondary | ICD-10-CM | POA: Diagnosis not present

## 2014-11-02 DIAGNOSIS — Z902 Acquired absence of lung [part of]: Secondary | ICD-10-CM | POA: Diagnosis not present

## 2014-11-02 DIAGNOSIS — L03116 Cellulitis of left lower limb: Secondary | ICD-10-CM | POA: Diagnosis not present

## 2014-11-02 DIAGNOSIS — I129 Hypertensive chronic kidney disease with stage 1 through stage 4 chronic kidney disease, or unspecified chronic kidney disease: Secondary | ICD-10-CM | POA: Diagnosis not present

## 2014-11-02 DIAGNOSIS — N183 Chronic kidney disease, stage 3 (moderate): Secondary | ICD-10-CM | POA: Diagnosis not present

## 2014-11-02 DIAGNOSIS — J449 Chronic obstructive pulmonary disease, unspecified: Secondary | ICD-10-CM | POA: Diagnosis not present

## 2014-11-02 DIAGNOSIS — Z9181 History of falling: Secondary | ICD-10-CM | POA: Diagnosis not present

## 2014-11-02 DIAGNOSIS — M1990 Unspecified osteoarthritis, unspecified site: Secondary | ICD-10-CM | POA: Diagnosis not present

## 2014-11-02 DIAGNOSIS — E119 Type 2 diabetes mellitus without complications: Secondary | ICD-10-CM | POA: Diagnosis not present

## 2014-11-05 DIAGNOSIS — N183 Chronic kidney disease, stage 3 (moderate): Secondary | ICD-10-CM | POA: Diagnosis not present

## 2014-11-05 DIAGNOSIS — L03116 Cellulitis of left lower limb: Secondary | ICD-10-CM | POA: Diagnosis not present

## 2014-11-05 DIAGNOSIS — J449 Chronic obstructive pulmonary disease, unspecified: Secondary | ICD-10-CM | POA: Diagnosis not present

## 2014-11-05 DIAGNOSIS — E119 Type 2 diabetes mellitus without complications: Secondary | ICD-10-CM | POA: Diagnosis not present

## 2014-11-05 DIAGNOSIS — I129 Hypertensive chronic kidney disease with stage 1 through stage 4 chronic kidney disease, or unspecified chronic kidney disease: Secondary | ICD-10-CM | POA: Diagnosis not present

## 2014-11-05 DIAGNOSIS — M1990 Unspecified osteoarthritis, unspecified site: Secondary | ICD-10-CM | POA: Diagnosis not present

## 2014-11-07 DIAGNOSIS — I129 Hypertensive chronic kidney disease with stage 1 through stage 4 chronic kidney disease, or unspecified chronic kidney disease: Secondary | ICD-10-CM | POA: Diagnosis not present

## 2014-11-07 DIAGNOSIS — N183 Chronic kidney disease, stage 3 (moderate): Secondary | ICD-10-CM | POA: Diagnosis not present

## 2014-11-07 DIAGNOSIS — E119 Type 2 diabetes mellitus without complications: Secondary | ICD-10-CM | POA: Diagnosis not present

## 2014-11-07 DIAGNOSIS — J449 Chronic obstructive pulmonary disease, unspecified: Secondary | ICD-10-CM | POA: Diagnosis not present

## 2014-11-07 DIAGNOSIS — M1990 Unspecified osteoarthritis, unspecified site: Secondary | ICD-10-CM | POA: Diagnosis not present

## 2014-11-07 DIAGNOSIS — L03116 Cellulitis of left lower limb: Secondary | ICD-10-CM | POA: Diagnosis not present

## 2014-11-08 DIAGNOSIS — M1990 Unspecified osteoarthritis, unspecified site: Secondary | ICD-10-CM | POA: Diagnosis not present

## 2014-11-08 DIAGNOSIS — I129 Hypertensive chronic kidney disease with stage 1 through stage 4 chronic kidney disease, or unspecified chronic kidney disease: Secondary | ICD-10-CM | POA: Diagnosis not present

## 2014-11-08 DIAGNOSIS — E119 Type 2 diabetes mellitus without complications: Secondary | ICD-10-CM | POA: Diagnosis not present

## 2014-11-08 DIAGNOSIS — J449 Chronic obstructive pulmonary disease, unspecified: Secondary | ICD-10-CM | POA: Diagnosis not present

## 2014-11-08 DIAGNOSIS — L03116 Cellulitis of left lower limb: Secondary | ICD-10-CM | POA: Diagnosis not present

## 2014-11-08 DIAGNOSIS — N183 Chronic kidney disease, stage 3 (moderate): Secondary | ICD-10-CM | POA: Diagnosis not present

## 2014-11-11 DIAGNOSIS — L03116 Cellulitis of left lower limb: Secondary | ICD-10-CM | POA: Diagnosis not present

## 2014-11-11 DIAGNOSIS — N183 Chronic kidney disease, stage 3 (moderate): Secondary | ICD-10-CM | POA: Diagnosis not present

## 2014-11-11 DIAGNOSIS — M1990 Unspecified osteoarthritis, unspecified site: Secondary | ICD-10-CM | POA: Diagnosis not present

## 2014-11-11 DIAGNOSIS — E119 Type 2 diabetes mellitus without complications: Secondary | ICD-10-CM | POA: Diagnosis not present

## 2014-11-11 DIAGNOSIS — J449 Chronic obstructive pulmonary disease, unspecified: Secondary | ICD-10-CM | POA: Diagnosis not present

## 2014-11-11 DIAGNOSIS — I129 Hypertensive chronic kidney disease with stage 1 through stage 4 chronic kidney disease, or unspecified chronic kidney disease: Secondary | ICD-10-CM | POA: Diagnosis not present

## 2014-11-15 DIAGNOSIS — I129 Hypertensive chronic kidney disease with stage 1 through stage 4 chronic kidney disease, or unspecified chronic kidney disease: Secondary | ICD-10-CM | POA: Diagnosis not present

## 2014-11-15 DIAGNOSIS — N183 Chronic kidney disease, stage 3 (moderate): Secondary | ICD-10-CM | POA: Diagnosis not present

## 2014-11-15 DIAGNOSIS — L03116 Cellulitis of left lower limb: Secondary | ICD-10-CM | POA: Diagnosis not present

## 2014-11-15 DIAGNOSIS — M1990 Unspecified osteoarthritis, unspecified site: Secondary | ICD-10-CM | POA: Diagnosis not present

## 2014-11-15 DIAGNOSIS — E119 Type 2 diabetes mellitus without complications: Secondary | ICD-10-CM | POA: Diagnosis not present

## 2014-11-15 DIAGNOSIS — J449 Chronic obstructive pulmonary disease, unspecified: Secondary | ICD-10-CM | POA: Diagnosis not present

## 2014-11-17 ENCOUNTER — Encounter (HOSPITAL_COMMUNITY): Payer: Self-pay | Admitting: Orthopedic Surgery

## 2014-11-18 DIAGNOSIS — M1990 Unspecified osteoarthritis, unspecified site: Secondary | ICD-10-CM | POA: Diagnosis not present

## 2014-11-18 DIAGNOSIS — I129 Hypertensive chronic kidney disease with stage 1 through stage 4 chronic kidney disease, or unspecified chronic kidney disease: Secondary | ICD-10-CM | POA: Diagnosis not present

## 2014-11-18 DIAGNOSIS — L03116 Cellulitis of left lower limb: Secondary | ICD-10-CM | POA: Diagnosis not present

## 2014-11-18 DIAGNOSIS — E119 Type 2 diabetes mellitus without complications: Secondary | ICD-10-CM | POA: Diagnosis not present

## 2014-11-18 DIAGNOSIS — J449 Chronic obstructive pulmonary disease, unspecified: Secondary | ICD-10-CM | POA: Diagnosis not present

## 2014-11-18 DIAGNOSIS — N183 Chronic kidney disease, stage 3 (moderate): Secondary | ICD-10-CM | POA: Diagnosis not present

## 2014-11-19 DIAGNOSIS — N183 Chronic kidney disease, stage 3 (moderate): Secondary | ICD-10-CM | POA: Diagnosis not present

## 2014-11-19 DIAGNOSIS — E119 Type 2 diabetes mellitus without complications: Secondary | ICD-10-CM | POA: Diagnosis not present

## 2014-11-19 DIAGNOSIS — I129 Hypertensive chronic kidney disease with stage 1 through stage 4 chronic kidney disease, or unspecified chronic kidney disease: Secondary | ICD-10-CM | POA: Diagnosis not present

## 2014-11-19 DIAGNOSIS — M1990 Unspecified osteoarthritis, unspecified site: Secondary | ICD-10-CM | POA: Diagnosis not present

## 2014-11-19 DIAGNOSIS — J449 Chronic obstructive pulmonary disease, unspecified: Secondary | ICD-10-CM | POA: Diagnosis not present

## 2014-11-19 DIAGNOSIS — L03116 Cellulitis of left lower limb: Secondary | ICD-10-CM | POA: Diagnosis not present

## 2014-11-21 DIAGNOSIS — S81802A Unspecified open wound, left lower leg, initial encounter: Secondary | ICD-10-CM | POA: Diagnosis not present

## 2014-11-22 DIAGNOSIS — J449 Chronic obstructive pulmonary disease, unspecified: Secondary | ICD-10-CM | POA: Diagnosis not present

## 2014-11-22 DIAGNOSIS — N183 Chronic kidney disease, stage 3 (moderate): Secondary | ICD-10-CM | POA: Diagnosis not present

## 2014-11-22 DIAGNOSIS — L03116 Cellulitis of left lower limb: Secondary | ICD-10-CM | POA: Diagnosis not present

## 2014-11-22 DIAGNOSIS — E119 Type 2 diabetes mellitus without complications: Secondary | ICD-10-CM | POA: Diagnosis not present

## 2014-11-22 DIAGNOSIS — M1990 Unspecified osteoarthritis, unspecified site: Secondary | ICD-10-CM | POA: Diagnosis not present

## 2014-11-22 DIAGNOSIS — I129 Hypertensive chronic kidney disease with stage 1 through stage 4 chronic kidney disease, or unspecified chronic kidney disease: Secondary | ICD-10-CM | POA: Diagnosis not present

## 2014-11-24 DIAGNOSIS — M1990 Unspecified osteoarthritis, unspecified site: Secondary | ICD-10-CM | POA: Diagnosis not present

## 2014-11-24 DIAGNOSIS — E119 Type 2 diabetes mellitus without complications: Secondary | ICD-10-CM | POA: Diagnosis not present

## 2014-11-24 DIAGNOSIS — J449 Chronic obstructive pulmonary disease, unspecified: Secondary | ICD-10-CM | POA: Diagnosis not present

## 2014-11-24 DIAGNOSIS — I129 Hypertensive chronic kidney disease with stage 1 through stage 4 chronic kidney disease, or unspecified chronic kidney disease: Secondary | ICD-10-CM | POA: Diagnosis not present

## 2014-11-24 DIAGNOSIS — L03116 Cellulitis of left lower limb: Secondary | ICD-10-CM | POA: Diagnosis not present

## 2014-11-24 DIAGNOSIS — N183 Chronic kidney disease, stage 3 (moderate): Secondary | ICD-10-CM | POA: Diagnosis not present

## 2014-11-28 DIAGNOSIS — I129 Hypertensive chronic kidney disease with stage 1 through stage 4 chronic kidney disease, or unspecified chronic kidney disease: Secondary | ICD-10-CM | POA: Diagnosis not present

## 2014-11-28 DIAGNOSIS — J449 Chronic obstructive pulmonary disease, unspecified: Secondary | ICD-10-CM | POA: Diagnosis not present

## 2014-11-28 DIAGNOSIS — L03116 Cellulitis of left lower limb: Secondary | ICD-10-CM | POA: Diagnosis not present

## 2014-11-28 DIAGNOSIS — E119 Type 2 diabetes mellitus without complications: Secondary | ICD-10-CM | POA: Diagnosis not present

## 2014-11-28 DIAGNOSIS — M1990 Unspecified osteoarthritis, unspecified site: Secondary | ICD-10-CM | POA: Diagnosis not present

## 2014-11-28 DIAGNOSIS — N183 Chronic kidney disease, stage 3 (moderate): Secondary | ICD-10-CM | POA: Diagnosis not present

## 2014-12-02 DIAGNOSIS — J449 Chronic obstructive pulmonary disease, unspecified: Secondary | ICD-10-CM | POA: Diagnosis not present

## 2014-12-02 DIAGNOSIS — M1990 Unspecified osteoarthritis, unspecified site: Secondary | ICD-10-CM | POA: Diagnosis not present

## 2014-12-02 DIAGNOSIS — L03116 Cellulitis of left lower limb: Secondary | ICD-10-CM | POA: Diagnosis not present

## 2014-12-02 DIAGNOSIS — N183 Chronic kidney disease, stage 3 (moderate): Secondary | ICD-10-CM | POA: Diagnosis not present

## 2014-12-02 DIAGNOSIS — I129 Hypertensive chronic kidney disease with stage 1 through stage 4 chronic kidney disease, or unspecified chronic kidney disease: Secondary | ICD-10-CM | POA: Diagnosis not present

## 2014-12-02 DIAGNOSIS — E119 Type 2 diabetes mellitus without complications: Secondary | ICD-10-CM | POA: Diagnosis not present

## 2014-12-05 DIAGNOSIS — I129 Hypertensive chronic kidney disease with stage 1 through stage 4 chronic kidney disease, or unspecified chronic kidney disease: Secondary | ICD-10-CM | POA: Diagnosis not present

## 2014-12-05 DIAGNOSIS — M1990 Unspecified osteoarthritis, unspecified site: Secondary | ICD-10-CM | POA: Diagnosis not present

## 2014-12-05 DIAGNOSIS — L03116 Cellulitis of left lower limb: Secondary | ICD-10-CM | POA: Diagnosis not present

## 2014-12-05 DIAGNOSIS — J449 Chronic obstructive pulmonary disease, unspecified: Secondary | ICD-10-CM | POA: Diagnosis not present

## 2014-12-05 DIAGNOSIS — E119 Type 2 diabetes mellitus without complications: Secondary | ICD-10-CM | POA: Diagnosis not present

## 2014-12-05 DIAGNOSIS — N183 Chronic kidney disease, stage 3 (moderate): Secondary | ICD-10-CM | POA: Diagnosis not present

## 2014-12-06 DIAGNOSIS — J449 Chronic obstructive pulmonary disease, unspecified: Secondary | ICD-10-CM | POA: Diagnosis not present

## 2014-12-06 DIAGNOSIS — L03116 Cellulitis of left lower limb: Secondary | ICD-10-CM | POA: Diagnosis not present

## 2014-12-06 DIAGNOSIS — I129 Hypertensive chronic kidney disease with stage 1 through stage 4 chronic kidney disease, or unspecified chronic kidney disease: Secondary | ICD-10-CM | POA: Diagnosis not present

## 2014-12-06 DIAGNOSIS — M1990 Unspecified osteoarthritis, unspecified site: Secondary | ICD-10-CM | POA: Diagnosis not present

## 2014-12-06 DIAGNOSIS — E119 Type 2 diabetes mellitus without complications: Secondary | ICD-10-CM | POA: Diagnosis not present

## 2014-12-06 DIAGNOSIS — N183 Chronic kidney disease, stage 3 (moderate): Secondary | ICD-10-CM | POA: Diagnosis not present

## 2014-12-07 DIAGNOSIS — I10 Essential (primary) hypertension: Secondary | ICD-10-CM | POA: Diagnosis not present

## 2014-12-09 DIAGNOSIS — L03116 Cellulitis of left lower limb: Secondary | ICD-10-CM | POA: Diagnosis not present

## 2014-12-09 DIAGNOSIS — N183 Chronic kidney disease, stage 3 (moderate): Secondary | ICD-10-CM | POA: Diagnosis not present

## 2014-12-09 DIAGNOSIS — M1990 Unspecified osteoarthritis, unspecified site: Secondary | ICD-10-CM | POA: Diagnosis not present

## 2014-12-09 DIAGNOSIS — I129 Hypertensive chronic kidney disease with stage 1 through stage 4 chronic kidney disease, or unspecified chronic kidney disease: Secondary | ICD-10-CM | POA: Diagnosis not present

## 2014-12-09 DIAGNOSIS — E119 Type 2 diabetes mellitus without complications: Secondary | ICD-10-CM | POA: Diagnosis not present

## 2014-12-09 DIAGNOSIS — J449 Chronic obstructive pulmonary disease, unspecified: Secondary | ICD-10-CM | POA: Diagnosis not present

## 2014-12-10 DIAGNOSIS — N183 Chronic kidney disease, stage 3 (moderate): Secondary | ICD-10-CM | POA: Diagnosis not present

## 2014-12-10 DIAGNOSIS — L03116 Cellulitis of left lower limb: Secondary | ICD-10-CM | POA: Diagnosis not present

## 2014-12-10 DIAGNOSIS — M1990 Unspecified osteoarthritis, unspecified site: Secondary | ICD-10-CM | POA: Diagnosis not present

## 2014-12-10 DIAGNOSIS — J449 Chronic obstructive pulmonary disease, unspecified: Secondary | ICD-10-CM | POA: Diagnosis not present

## 2014-12-10 DIAGNOSIS — I129 Hypertensive chronic kidney disease with stage 1 through stage 4 chronic kidney disease, or unspecified chronic kidney disease: Secondary | ICD-10-CM | POA: Diagnosis not present

## 2014-12-10 DIAGNOSIS — E119 Type 2 diabetes mellitus without complications: Secondary | ICD-10-CM | POA: Diagnosis not present

## 2014-12-14 DIAGNOSIS — M1990 Unspecified osteoarthritis, unspecified site: Secondary | ICD-10-CM | POA: Diagnosis not present

## 2014-12-14 DIAGNOSIS — I129 Hypertensive chronic kidney disease with stage 1 through stage 4 chronic kidney disease, or unspecified chronic kidney disease: Secondary | ICD-10-CM | POA: Diagnosis not present

## 2014-12-14 DIAGNOSIS — L03116 Cellulitis of left lower limb: Secondary | ICD-10-CM | POA: Diagnosis not present

## 2014-12-14 DIAGNOSIS — N183 Chronic kidney disease, stage 3 (moderate): Secondary | ICD-10-CM | POA: Diagnosis not present

## 2014-12-14 DIAGNOSIS — J449 Chronic obstructive pulmonary disease, unspecified: Secondary | ICD-10-CM | POA: Diagnosis not present

## 2014-12-14 DIAGNOSIS — E119 Type 2 diabetes mellitus without complications: Secondary | ICD-10-CM | POA: Diagnosis not present

## 2014-12-16 DIAGNOSIS — I129 Hypertensive chronic kidney disease with stage 1 through stage 4 chronic kidney disease, or unspecified chronic kidney disease: Secondary | ICD-10-CM | POA: Diagnosis not present

## 2014-12-16 DIAGNOSIS — N183 Chronic kidney disease, stage 3 (moderate): Secondary | ICD-10-CM | POA: Diagnosis not present

## 2014-12-16 DIAGNOSIS — E119 Type 2 diabetes mellitus without complications: Secondary | ICD-10-CM | POA: Diagnosis not present

## 2014-12-16 DIAGNOSIS — J449 Chronic obstructive pulmonary disease, unspecified: Secondary | ICD-10-CM | POA: Diagnosis not present

## 2014-12-16 DIAGNOSIS — M1990 Unspecified osteoarthritis, unspecified site: Secondary | ICD-10-CM | POA: Diagnosis not present

## 2014-12-16 DIAGNOSIS — L03116 Cellulitis of left lower limb: Secondary | ICD-10-CM | POA: Diagnosis not present

## 2014-12-20 DIAGNOSIS — J449 Chronic obstructive pulmonary disease, unspecified: Secondary | ICD-10-CM | POA: Diagnosis not present

## 2014-12-20 DIAGNOSIS — N183 Chronic kidney disease, stage 3 (moderate): Secondary | ICD-10-CM | POA: Diagnosis not present

## 2014-12-20 DIAGNOSIS — M1990 Unspecified osteoarthritis, unspecified site: Secondary | ICD-10-CM | POA: Diagnosis not present

## 2014-12-20 DIAGNOSIS — I129 Hypertensive chronic kidney disease with stage 1 through stage 4 chronic kidney disease, or unspecified chronic kidney disease: Secondary | ICD-10-CM | POA: Diagnosis not present

## 2014-12-20 DIAGNOSIS — E119 Type 2 diabetes mellitus without complications: Secondary | ICD-10-CM | POA: Diagnosis not present

## 2014-12-20 DIAGNOSIS — L03116 Cellulitis of left lower limb: Secondary | ICD-10-CM | POA: Diagnosis not present

## 2014-12-23 DIAGNOSIS — J449 Chronic obstructive pulmonary disease, unspecified: Secondary | ICD-10-CM | POA: Diagnosis not present

## 2014-12-23 DIAGNOSIS — I129 Hypertensive chronic kidney disease with stage 1 through stage 4 chronic kidney disease, or unspecified chronic kidney disease: Secondary | ICD-10-CM | POA: Diagnosis not present

## 2014-12-23 DIAGNOSIS — E119 Type 2 diabetes mellitus without complications: Secondary | ICD-10-CM | POA: Diagnosis not present

## 2014-12-23 DIAGNOSIS — L03116 Cellulitis of left lower limb: Secondary | ICD-10-CM | POA: Diagnosis not present

## 2014-12-23 DIAGNOSIS — N183 Chronic kidney disease, stage 3 (moderate): Secondary | ICD-10-CM | POA: Diagnosis not present

## 2014-12-23 DIAGNOSIS — M1990 Unspecified osteoarthritis, unspecified site: Secondary | ICD-10-CM | POA: Diagnosis not present

## 2014-12-27 DIAGNOSIS — M1990 Unspecified osteoarthritis, unspecified site: Secondary | ICD-10-CM | POA: Diagnosis not present

## 2014-12-27 DIAGNOSIS — E119 Type 2 diabetes mellitus without complications: Secondary | ICD-10-CM | POA: Diagnosis not present

## 2014-12-27 DIAGNOSIS — N183 Chronic kidney disease, stage 3 (moderate): Secondary | ICD-10-CM | POA: Diagnosis not present

## 2014-12-27 DIAGNOSIS — J449 Chronic obstructive pulmonary disease, unspecified: Secondary | ICD-10-CM | POA: Diagnosis not present

## 2014-12-27 DIAGNOSIS — L03116 Cellulitis of left lower limb: Secondary | ICD-10-CM | POA: Diagnosis not present

## 2014-12-27 DIAGNOSIS — I129 Hypertensive chronic kidney disease with stage 1 through stage 4 chronic kidney disease, or unspecified chronic kidney disease: Secondary | ICD-10-CM | POA: Diagnosis not present

## 2014-12-30 DIAGNOSIS — M1990 Unspecified osteoarthritis, unspecified site: Secondary | ICD-10-CM | POA: Diagnosis not present

## 2014-12-30 DIAGNOSIS — J449 Chronic obstructive pulmonary disease, unspecified: Secondary | ICD-10-CM | POA: Diagnosis not present

## 2014-12-30 DIAGNOSIS — N183 Chronic kidney disease, stage 3 (moderate): Secondary | ICD-10-CM | POA: Diagnosis not present

## 2014-12-30 DIAGNOSIS — E119 Type 2 diabetes mellitus without complications: Secondary | ICD-10-CM | POA: Diagnosis not present

## 2014-12-30 DIAGNOSIS — Z96652 Presence of left artificial knee joint: Secondary | ICD-10-CM | POA: Diagnosis not present

## 2014-12-30 DIAGNOSIS — I129 Hypertensive chronic kidney disease with stage 1 through stage 4 chronic kidney disease, or unspecified chronic kidney disease: Secondary | ICD-10-CM | POA: Diagnosis not present

## 2014-12-30 DIAGNOSIS — L03116 Cellulitis of left lower limb: Secondary | ICD-10-CM | POA: Diagnosis not present

## 2014-12-30 DIAGNOSIS — Z96641 Presence of right artificial hip joint: Secondary | ICD-10-CM | POA: Diagnosis not present

## 2014-12-30 DIAGNOSIS — Z471 Aftercare following joint replacement surgery: Secondary | ICD-10-CM | POA: Diagnosis not present

## 2015-01-01 DIAGNOSIS — Z902 Acquired absence of lung [part of]: Secondary | ICD-10-CM | POA: Diagnosis not present

## 2015-01-01 DIAGNOSIS — L03116 Cellulitis of left lower limb: Secondary | ICD-10-CM | POA: Diagnosis not present

## 2015-01-01 DIAGNOSIS — Z9181 History of falling: Secondary | ICD-10-CM | POA: Diagnosis not present

## 2015-01-01 DIAGNOSIS — L03317 Cellulitis of buttock: Secondary | ICD-10-CM | POA: Diagnosis not present

## 2015-01-01 DIAGNOSIS — J449 Chronic obstructive pulmonary disease, unspecified: Secondary | ICD-10-CM | POA: Diagnosis not present

## 2015-01-01 DIAGNOSIS — I129 Hypertensive chronic kidney disease with stage 1 through stage 4 chronic kidney disease, or unspecified chronic kidney disease: Secondary | ICD-10-CM | POA: Diagnosis not present

## 2015-01-01 DIAGNOSIS — M1990 Unspecified osteoarthritis, unspecified site: Secondary | ICD-10-CM | POA: Diagnosis not present

## 2015-01-01 DIAGNOSIS — Z85110 Personal history of malignant carcinoid tumor of bronchus and lung: Secondary | ICD-10-CM | POA: Diagnosis not present

## 2015-01-01 DIAGNOSIS — N183 Chronic kidney disease, stage 3 (moderate): Secondary | ICD-10-CM | POA: Diagnosis not present

## 2015-01-01 DIAGNOSIS — E119 Type 2 diabetes mellitus without complications: Secondary | ICD-10-CM | POA: Diagnosis not present

## 2015-01-03 DIAGNOSIS — L03116 Cellulitis of left lower limb: Secondary | ICD-10-CM | POA: Diagnosis not present

## 2015-01-03 DIAGNOSIS — E119 Type 2 diabetes mellitus without complications: Secondary | ICD-10-CM | POA: Diagnosis not present

## 2015-01-03 DIAGNOSIS — N183 Chronic kidney disease, stage 3 (moderate): Secondary | ICD-10-CM | POA: Diagnosis not present

## 2015-01-03 DIAGNOSIS — J449 Chronic obstructive pulmonary disease, unspecified: Secondary | ICD-10-CM | POA: Diagnosis not present

## 2015-01-03 DIAGNOSIS — M1990 Unspecified osteoarthritis, unspecified site: Secondary | ICD-10-CM | POA: Diagnosis not present

## 2015-01-03 DIAGNOSIS — I129 Hypertensive chronic kidney disease with stage 1 through stage 4 chronic kidney disease, or unspecified chronic kidney disease: Secondary | ICD-10-CM | POA: Diagnosis not present

## 2015-01-06 DIAGNOSIS — N183 Chronic kidney disease, stage 3 (moderate): Secondary | ICD-10-CM | POA: Diagnosis not present

## 2015-01-06 DIAGNOSIS — M1990 Unspecified osteoarthritis, unspecified site: Secondary | ICD-10-CM | POA: Diagnosis not present

## 2015-01-06 DIAGNOSIS — J449 Chronic obstructive pulmonary disease, unspecified: Secondary | ICD-10-CM | POA: Diagnosis not present

## 2015-01-06 DIAGNOSIS — L03116 Cellulitis of left lower limb: Secondary | ICD-10-CM | POA: Diagnosis not present

## 2015-01-06 DIAGNOSIS — E119 Type 2 diabetes mellitus without complications: Secondary | ICD-10-CM | POA: Diagnosis not present

## 2015-01-06 DIAGNOSIS — I129 Hypertensive chronic kidney disease with stage 1 through stage 4 chronic kidney disease, or unspecified chronic kidney disease: Secondary | ICD-10-CM | POA: Diagnosis not present

## 2015-01-10 DIAGNOSIS — L03116 Cellulitis of left lower limb: Secondary | ICD-10-CM | POA: Diagnosis not present

## 2015-01-10 DIAGNOSIS — J449 Chronic obstructive pulmonary disease, unspecified: Secondary | ICD-10-CM | POA: Diagnosis not present

## 2015-01-10 DIAGNOSIS — N183 Chronic kidney disease, stage 3 (moderate): Secondary | ICD-10-CM | POA: Diagnosis not present

## 2015-01-10 DIAGNOSIS — M1990 Unspecified osteoarthritis, unspecified site: Secondary | ICD-10-CM | POA: Diagnosis not present

## 2015-01-10 DIAGNOSIS — E119 Type 2 diabetes mellitus without complications: Secondary | ICD-10-CM | POA: Diagnosis not present

## 2015-01-10 DIAGNOSIS — I129 Hypertensive chronic kidney disease with stage 1 through stage 4 chronic kidney disease, or unspecified chronic kidney disease: Secondary | ICD-10-CM | POA: Diagnosis not present

## 2015-01-13 DIAGNOSIS — J449 Chronic obstructive pulmonary disease, unspecified: Secondary | ICD-10-CM | POA: Diagnosis not present

## 2015-01-13 DIAGNOSIS — I129 Hypertensive chronic kidney disease with stage 1 through stage 4 chronic kidney disease, or unspecified chronic kidney disease: Secondary | ICD-10-CM | POA: Diagnosis not present

## 2015-01-13 DIAGNOSIS — N183 Chronic kidney disease, stage 3 (moderate): Secondary | ICD-10-CM | POA: Diagnosis not present

## 2015-01-13 DIAGNOSIS — M1990 Unspecified osteoarthritis, unspecified site: Secondary | ICD-10-CM | POA: Diagnosis not present

## 2015-01-13 DIAGNOSIS — L03116 Cellulitis of left lower limb: Secondary | ICD-10-CM | POA: Diagnosis not present

## 2015-01-13 DIAGNOSIS — E119 Type 2 diabetes mellitus without complications: Secondary | ICD-10-CM | POA: Diagnosis not present

## 2015-01-17 DIAGNOSIS — J449 Chronic obstructive pulmonary disease, unspecified: Secondary | ICD-10-CM | POA: Diagnosis not present

## 2015-01-17 DIAGNOSIS — E119 Type 2 diabetes mellitus without complications: Secondary | ICD-10-CM | POA: Diagnosis not present

## 2015-01-17 DIAGNOSIS — I129 Hypertensive chronic kidney disease with stage 1 through stage 4 chronic kidney disease, or unspecified chronic kidney disease: Secondary | ICD-10-CM | POA: Diagnosis not present

## 2015-01-17 DIAGNOSIS — L03116 Cellulitis of left lower limb: Secondary | ICD-10-CM | POA: Diagnosis not present

## 2015-01-17 DIAGNOSIS — M1990 Unspecified osteoarthritis, unspecified site: Secondary | ICD-10-CM | POA: Diagnosis not present

## 2015-01-17 DIAGNOSIS — N183 Chronic kidney disease, stage 3 (moderate): Secondary | ICD-10-CM | POA: Diagnosis not present

## 2015-01-20 DIAGNOSIS — J449 Chronic obstructive pulmonary disease, unspecified: Secondary | ICD-10-CM | POA: Diagnosis not present

## 2015-01-20 DIAGNOSIS — E119 Type 2 diabetes mellitus without complications: Secondary | ICD-10-CM | POA: Diagnosis not present

## 2015-01-20 DIAGNOSIS — N183 Chronic kidney disease, stage 3 (moderate): Secondary | ICD-10-CM | POA: Diagnosis not present

## 2015-01-20 DIAGNOSIS — I129 Hypertensive chronic kidney disease with stage 1 through stage 4 chronic kidney disease, or unspecified chronic kidney disease: Secondary | ICD-10-CM | POA: Diagnosis not present

## 2015-01-20 DIAGNOSIS — L03116 Cellulitis of left lower limb: Secondary | ICD-10-CM | POA: Diagnosis not present

## 2015-01-20 DIAGNOSIS — M1990 Unspecified osteoarthritis, unspecified site: Secondary | ICD-10-CM | POA: Diagnosis not present

## 2015-01-24 DIAGNOSIS — N183 Chronic kidney disease, stage 3 (moderate): Secondary | ICD-10-CM | POA: Diagnosis not present

## 2015-01-24 DIAGNOSIS — M1990 Unspecified osteoarthritis, unspecified site: Secondary | ICD-10-CM | POA: Diagnosis not present

## 2015-01-24 DIAGNOSIS — E119 Type 2 diabetes mellitus without complications: Secondary | ICD-10-CM | POA: Diagnosis not present

## 2015-01-24 DIAGNOSIS — I129 Hypertensive chronic kidney disease with stage 1 through stage 4 chronic kidney disease, or unspecified chronic kidney disease: Secondary | ICD-10-CM | POA: Diagnosis not present

## 2015-01-24 DIAGNOSIS — L03116 Cellulitis of left lower limb: Secondary | ICD-10-CM | POA: Diagnosis not present

## 2015-01-24 DIAGNOSIS — J449 Chronic obstructive pulmonary disease, unspecified: Secondary | ICD-10-CM | POA: Diagnosis not present

## 2015-01-27 DIAGNOSIS — N183 Chronic kidney disease, stage 3 (moderate): Secondary | ICD-10-CM | POA: Diagnosis not present

## 2015-01-27 DIAGNOSIS — M1990 Unspecified osteoarthritis, unspecified site: Secondary | ICD-10-CM | POA: Diagnosis not present

## 2015-01-27 DIAGNOSIS — J449 Chronic obstructive pulmonary disease, unspecified: Secondary | ICD-10-CM | POA: Diagnosis not present

## 2015-01-27 DIAGNOSIS — I129 Hypertensive chronic kidney disease with stage 1 through stage 4 chronic kidney disease, or unspecified chronic kidney disease: Secondary | ICD-10-CM | POA: Diagnosis not present

## 2015-01-27 DIAGNOSIS — E119 Type 2 diabetes mellitus without complications: Secondary | ICD-10-CM | POA: Diagnosis not present

## 2015-01-27 DIAGNOSIS — L03116 Cellulitis of left lower limb: Secondary | ICD-10-CM | POA: Diagnosis not present

## 2015-01-31 DIAGNOSIS — J449 Chronic obstructive pulmonary disease, unspecified: Secondary | ICD-10-CM | POA: Diagnosis not present

## 2015-01-31 DIAGNOSIS — I129 Hypertensive chronic kidney disease with stage 1 through stage 4 chronic kidney disease, or unspecified chronic kidney disease: Secondary | ICD-10-CM | POA: Diagnosis not present

## 2015-01-31 DIAGNOSIS — N183 Chronic kidney disease, stage 3 (moderate): Secondary | ICD-10-CM | POA: Diagnosis not present

## 2015-01-31 DIAGNOSIS — E119 Type 2 diabetes mellitus without complications: Secondary | ICD-10-CM | POA: Diagnosis not present

## 2015-01-31 DIAGNOSIS — L03116 Cellulitis of left lower limb: Secondary | ICD-10-CM | POA: Diagnosis not present

## 2015-01-31 DIAGNOSIS — M1990 Unspecified osteoarthritis, unspecified site: Secondary | ICD-10-CM | POA: Diagnosis not present

## 2015-02-03 DIAGNOSIS — L03116 Cellulitis of left lower limb: Secondary | ICD-10-CM | POA: Diagnosis not present

## 2015-02-03 DIAGNOSIS — M1990 Unspecified osteoarthritis, unspecified site: Secondary | ICD-10-CM | POA: Diagnosis not present

## 2015-02-03 DIAGNOSIS — I129 Hypertensive chronic kidney disease with stage 1 through stage 4 chronic kidney disease, or unspecified chronic kidney disease: Secondary | ICD-10-CM | POA: Diagnosis not present

## 2015-02-03 DIAGNOSIS — N183 Chronic kidney disease, stage 3 (moderate): Secondary | ICD-10-CM | POA: Diagnosis not present

## 2015-02-03 DIAGNOSIS — J449 Chronic obstructive pulmonary disease, unspecified: Secondary | ICD-10-CM | POA: Diagnosis not present

## 2015-02-03 DIAGNOSIS — E119 Type 2 diabetes mellitus without complications: Secondary | ICD-10-CM | POA: Diagnosis not present

## 2015-02-06 DIAGNOSIS — N183 Chronic kidney disease, stage 3 (moderate): Secondary | ICD-10-CM | POA: Diagnosis not present

## 2015-02-06 DIAGNOSIS — M1990 Unspecified osteoarthritis, unspecified site: Secondary | ICD-10-CM | POA: Diagnosis not present

## 2015-02-06 DIAGNOSIS — L03116 Cellulitis of left lower limb: Secondary | ICD-10-CM | POA: Diagnosis not present

## 2015-02-06 DIAGNOSIS — I129 Hypertensive chronic kidney disease with stage 1 through stage 4 chronic kidney disease, or unspecified chronic kidney disease: Secondary | ICD-10-CM | POA: Diagnosis not present

## 2015-02-06 DIAGNOSIS — E119 Type 2 diabetes mellitus without complications: Secondary | ICD-10-CM | POA: Diagnosis not present

## 2015-02-06 DIAGNOSIS — J449 Chronic obstructive pulmonary disease, unspecified: Secondary | ICD-10-CM | POA: Diagnosis not present

## 2015-02-10 DIAGNOSIS — L03116 Cellulitis of left lower limb: Secondary | ICD-10-CM | POA: Diagnosis not present

## 2015-02-10 DIAGNOSIS — I129 Hypertensive chronic kidney disease with stage 1 through stage 4 chronic kidney disease, or unspecified chronic kidney disease: Secondary | ICD-10-CM | POA: Diagnosis not present

## 2015-02-10 DIAGNOSIS — M1990 Unspecified osteoarthritis, unspecified site: Secondary | ICD-10-CM | POA: Diagnosis not present

## 2015-02-10 DIAGNOSIS — J449 Chronic obstructive pulmonary disease, unspecified: Secondary | ICD-10-CM | POA: Diagnosis not present

## 2015-02-10 DIAGNOSIS — N183 Chronic kidney disease, stage 3 (moderate): Secondary | ICD-10-CM | POA: Diagnosis not present

## 2015-02-10 DIAGNOSIS — E119 Type 2 diabetes mellitus without complications: Secondary | ICD-10-CM | POA: Diagnosis not present

## 2015-02-13 DIAGNOSIS — L03116 Cellulitis of left lower limb: Secondary | ICD-10-CM | POA: Diagnosis not present

## 2015-02-13 DIAGNOSIS — N183 Chronic kidney disease, stage 3 (moderate): Secondary | ICD-10-CM | POA: Diagnosis not present

## 2015-02-13 DIAGNOSIS — I129 Hypertensive chronic kidney disease with stage 1 through stage 4 chronic kidney disease, or unspecified chronic kidney disease: Secondary | ICD-10-CM | POA: Diagnosis not present

## 2015-02-13 DIAGNOSIS — M1990 Unspecified osteoarthritis, unspecified site: Secondary | ICD-10-CM | POA: Diagnosis not present

## 2015-02-13 DIAGNOSIS — E119 Type 2 diabetes mellitus without complications: Secondary | ICD-10-CM | POA: Diagnosis not present

## 2015-02-13 DIAGNOSIS — J449 Chronic obstructive pulmonary disease, unspecified: Secondary | ICD-10-CM | POA: Diagnosis not present

## 2015-02-16 DIAGNOSIS — J449 Chronic obstructive pulmonary disease, unspecified: Secondary | ICD-10-CM | POA: Diagnosis not present

## 2015-02-16 DIAGNOSIS — E119 Type 2 diabetes mellitus without complications: Secondary | ICD-10-CM | POA: Diagnosis not present

## 2015-02-16 DIAGNOSIS — M1990 Unspecified osteoarthritis, unspecified site: Secondary | ICD-10-CM | POA: Diagnosis not present

## 2015-02-16 DIAGNOSIS — L03116 Cellulitis of left lower limb: Secondary | ICD-10-CM | POA: Diagnosis not present

## 2015-02-16 DIAGNOSIS — I129 Hypertensive chronic kidney disease with stage 1 through stage 4 chronic kidney disease, or unspecified chronic kidney disease: Secondary | ICD-10-CM | POA: Diagnosis not present

## 2015-02-16 DIAGNOSIS — N183 Chronic kidney disease, stage 3 (moderate): Secondary | ICD-10-CM | POA: Diagnosis not present

## 2015-02-21 DIAGNOSIS — I129 Hypertensive chronic kidney disease with stage 1 through stage 4 chronic kidney disease, or unspecified chronic kidney disease: Secondary | ICD-10-CM | POA: Diagnosis not present

## 2015-02-21 DIAGNOSIS — J449 Chronic obstructive pulmonary disease, unspecified: Secondary | ICD-10-CM | POA: Diagnosis not present

## 2015-02-21 DIAGNOSIS — E119 Type 2 diabetes mellitus without complications: Secondary | ICD-10-CM | POA: Diagnosis not present

## 2015-02-21 DIAGNOSIS — M1990 Unspecified osteoarthritis, unspecified site: Secondary | ICD-10-CM | POA: Diagnosis not present

## 2015-02-21 DIAGNOSIS — N183 Chronic kidney disease, stage 3 (moderate): Secondary | ICD-10-CM | POA: Diagnosis not present

## 2015-02-21 DIAGNOSIS — L03116 Cellulitis of left lower limb: Secondary | ICD-10-CM | POA: Diagnosis not present

## 2015-02-24 DIAGNOSIS — L03116 Cellulitis of left lower limb: Secondary | ICD-10-CM | POA: Diagnosis not present

## 2015-02-24 DIAGNOSIS — J449 Chronic obstructive pulmonary disease, unspecified: Secondary | ICD-10-CM | POA: Diagnosis not present

## 2015-02-24 DIAGNOSIS — N183 Chronic kidney disease, stage 3 (moderate): Secondary | ICD-10-CM | POA: Diagnosis not present

## 2015-02-24 DIAGNOSIS — E119 Type 2 diabetes mellitus without complications: Secondary | ICD-10-CM | POA: Diagnosis not present

## 2015-02-24 DIAGNOSIS — I129 Hypertensive chronic kidney disease with stage 1 through stage 4 chronic kidney disease, or unspecified chronic kidney disease: Secondary | ICD-10-CM | POA: Diagnosis not present

## 2015-02-24 DIAGNOSIS — M1990 Unspecified osteoarthritis, unspecified site: Secondary | ICD-10-CM | POA: Diagnosis not present

## 2015-03-01 ENCOUNTER — Encounter: Payer: Self-pay | Admitting: Internal Medicine

## 2015-03-02 DIAGNOSIS — F325 Major depressive disorder, single episode, in full remission: Secondary | ICD-10-CM | POA: Diagnosis not present

## 2015-03-02 DIAGNOSIS — M810 Age-related osteoporosis without current pathological fracture: Secondary | ICD-10-CM | POA: Diagnosis not present

## 2015-03-02 DIAGNOSIS — M4806 Spinal stenosis, lumbar region: Secondary | ICD-10-CM | POA: Diagnosis not present

## 2015-03-02 DIAGNOSIS — Z902 Acquired absence of lung [part of]: Secondary | ICD-10-CM | POA: Diagnosis not present

## 2015-03-02 DIAGNOSIS — I129 Hypertensive chronic kidney disease with stage 1 through stage 4 chronic kidney disease, or unspecified chronic kidney disease: Secondary | ICD-10-CM | POA: Diagnosis not present

## 2015-03-02 DIAGNOSIS — Z85110 Personal history of malignant carcinoid tumor of bronchus and lung: Secondary | ICD-10-CM | POA: Diagnosis not present

## 2015-03-02 DIAGNOSIS — L03116 Cellulitis of left lower limb: Secondary | ICD-10-CM | POA: Diagnosis not present

## 2015-03-02 DIAGNOSIS — N183 Chronic kidney disease, stage 3 (moderate): Secondary | ICD-10-CM | POA: Diagnosis not present

## 2015-03-02 DIAGNOSIS — J449 Chronic obstructive pulmonary disease, unspecified: Secondary | ICD-10-CM | POA: Diagnosis not present

## 2015-03-02 DIAGNOSIS — Z1389 Encounter for screening for other disorder: Secondary | ICD-10-CM | POA: Diagnosis not present

## 2015-03-02 DIAGNOSIS — E1122 Type 2 diabetes mellitus with diabetic chronic kidney disease: Secondary | ICD-10-CM | POA: Diagnosis not present

## 2015-03-02 DIAGNOSIS — M1990 Unspecified osteoarthritis, unspecified site: Secondary | ICD-10-CM | POA: Diagnosis not present

## 2015-03-02 DIAGNOSIS — I10 Essential (primary) hypertension: Secondary | ICD-10-CM | POA: Diagnosis not present

## 2015-03-02 DIAGNOSIS — Z9181 History of falling: Secondary | ICD-10-CM | POA: Diagnosis not present

## 2015-03-02 DIAGNOSIS — C3491 Malignant neoplasm of unspecified part of right bronchus or lung: Secondary | ICD-10-CM | POA: Diagnosis not present

## 2015-03-06 DIAGNOSIS — L03116 Cellulitis of left lower limb: Secondary | ICD-10-CM | POA: Diagnosis not present

## 2015-03-06 DIAGNOSIS — J449 Chronic obstructive pulmonary disease, unspecified: Secondary | ICD-10-CM | POA: Diagnosis not present

## 2015-03-06 DIAGNOSIS — I129 Hypertensive chronic kidney disease with stage 1 through stage 4 chronic kidney disease, or unspecified chronic kidney disease: Secondary | ICD-10-CM | POA: Diagnosis not present

## 2015-03-06 DIAGNOSIS — E1122 Type 2 diabetes mellitus with diabetic chronic kidney disease: Secondary | ICD-10-CM | POA: Diagnosis not present

## 2015-03-06 DIAGNOSIS — N183 Chronic kidney disease, stage 3 (moderate): Secondary | ICD-10-CM | POA: Diagnosis not present

## 2015-03-06 DIAGNOSIS — M1990 Unspecified osteoarthritis, unspecified site: Secondary | ICD-10-CM | POA: Diagnosis not present

## 2015-03-16 DIAGNOSIS — N183 Chronic kidney disease, stage 3 (moderate): Secondary | ICD-10-CM | POA: Diagnosis not present

## 2015-03-16 DIAGNOSIS — E1122 Type 2 diabetes mellitus with diabetic chronic kidney disease: Secondary | ICD-10-CM | POA: Diagnosis not present

## 2015-03-16 DIAGNOSIS — M1990 Unspecified osteoarthritis, unspecified site: Secondary | ICD-10-CM | POA: Diagnosis not present

## 2015-03-16 DIAGNOSIS — L03116 Cellulitis of left lower limb: Secondary | ICD-10-CM | POA: Diagnosis not present

## 2015-03-16 DIAGNOSIS — J449 Chronic obstructive pulmonary disease, unspecified: Secondary | ICD-10-CM | POA: Diagnosis not present

## 2015-03-16 DIAGNOSIS — I129 Hypertensive chronic kidney disease with stage 1 through stage 4 chronic kidney disease, or unspecified chronic kidney disease: Secondary | ICD-10-CM | POA: Diagnosis not present

## 2015-03-22 DIAGNOSIS — J449 Chronic obstructive pulmonary disease, unspecified: Secondary | ICD-10-CM | POA: Diagnosis not present

## 2015-03-22 DIAGNOSIS — I129 Hypertensive chronic kidney disease with stage 1 through stage 4 chronic kidney disease, or unspecified chronic kidney disease: Secondary | ICD-10-CM | POA: Diagnosis not present

## 2015-03-22 DIAGNOSIS — L03116 Cellulitis of left lower limb: Secondary | ICD-10-CM | POA: Diagnosis not present

## 2015-03-22 DIAGNOSIS — E1122 Type 2 diabetes mellitus with diabetic chronic kidney disease: Secondary | ICD-10-CM | POA: Diagnosis not present

## 2015-03-22 DIAGNOSIS — M1990 Unspecified osteoarthritis, unspecified site: Secondary | ICD-10-CM | POA: Diagnosis not present

## 2015-03-22 DIAGNOSIS — N183 Chronic kidney disease, stage 3 (moderate): Secondary | ICD-10-CM | POA: Diagnosis not present

## 2015-03-23 DIAGNOSIS — Z4789 Encounter for other orthopedic aftercare: Secondary | ICD-10-CM | POA: Diagnosis not present

## 2015-03-23 DIAGNOSIS — Z471 Aftercare following joint replacement surgery: Secondary | ICD-10-CM | POA: Diagnosis not present

## 2015-03-27 DIAGNOSIS — I129 Hypertensive chronic kidney disease with stage 1 through stage 4 chronic kidney disease, or unspecified chronic kidney disease: Secondary | ICD-10-CM | POA: Diagnosis not present

## 2015-03-27 DIAGNOSIS — J449 Chronic obstructive pulmonary disease, unspecified: Secondary | ICD-10-CM | POA: Diagnosis not present

## 2015-03-27 DIAGNOSIS — L03116 Cellulitis of left lower limb: Secondary | ICD-10-CM | POA: Diagnosis not present

## 2015-03-27 DIAGNOSIS — N183 Chronic kidney disease, stage 3 (moderate): Secondary | ICD-10-CM | POA: Diagnosis not present

## 2015-03-27 DIAGNOSIS — E1122 Type 2 diabetes mellitus with diabetic chronic kidney disease: Secondary | ICD-10-CM | POA: Diagnosis not present

## 2015-03-27 DIAGNOSIS — M1990 Unspecified osteoarthritis, unspecified site: Secondary | ICD-10-CM | POA: Diagnosis not present

## 2015-03-29 DIAGNOSIS — M8589 Other specified disorders of bone density and structure, multiple sites: Secondary | ICD-10-CM | POA: Diagnosis not present

## 2015-03-29 DIAGNOSIS — M859 Disorder of bone density and structure, unspecified: Secondary | ICD-10-CM | POA: Diagnosis not present

## 2015-04-04 DIAGNOSIS — M1990 Unspecified osteoarthritis, unspecified site: Secondary | ICD-10-CM | POA: Diagnosis not present

## 2015-04-04 DIAGNOSIS — L03116 Cellulitis of left lower limb: Secondary | ICD-10-CM | POA: Diagnosis not present

## 2015-04-04 DIAGNOSIS — I129 Hypertensive chronic kidney disease with stage 1 through stage 4 chronic kidney disease, or unspecified chronic kidney disease: Secondary | ICD-10-CM | POA: Diagnosis not present

## 2015-04-04 DIAGNOSIS — N183 Chronic kidney disease, stage 3 (moderate): Secondary | ICD-10-CM | POA: Diagnosis not present

## 2015-04-04 DIAGNOSIS — E1122 Type 2 diabetes mellitus with diabetic chronic kidney disease: Secondary | ICD-10-CM | POA: Diagnosis not present

## 2015-04-04 DIAGNOSIS — J449 Chronic obstructive pulmonary disease, unspecified: Secondary | ICD-10-CM | POA: Diagnosis not present

## 2015-04-10 DIAGNOSIS — I129 Hypertensive chronic kidney disease with stage 1 through stage 4 chronic kidney disease, or unspecified chronic kidney disease: Secondary | ICD-10-CM | POA: Diagnosis not present

## 2015-04-10 DIAGNOSIS — N183 Chronic kidney disease, stage 3 (moderate): Secondary | ICD-10-CM | POA: Diagnosis not present

## 2015-04-10 DIAGNOSIS — E1122 Type 2 diabetes mellitus with diabetic chronic kidney disease: Secondary | ICD-10-CM | POA: Diagnosis not present

## 2015-04-10 DIAGNOSIS — M1990 Unspecified osteoarthritis, unspecified site: Secondary | ICD-10-CM | POA: Diagnosis not present

## 2015-04-10 DIAGNOSIS — L03116 Cellulitis of left lower limb: Secondary | ICD-10-CM | POA: Diagnosis not present

## 2015-04-10 DIAGNOSIS — J449 Chronic obstructive pulmonary disease, unspecified: Secondary | ICD-10-CM | POA: Diagnosis not present

## 2015-04-17 ENCOUNTER — Encounter: Payer: Self-pay | Admitting: Internal Medicine

## 2015-04-17 ENCOUNTER — Ambulatory Visit (INDEPENDENT_AMBULATORY_CARE_PROVIDER_SITE_OTHER): Payer: Medicare Other | Admitting: Internal Medicine

## 2015-04-17 VITALS — BP 130/82 | HR 66 | Ht 62.0 in | Wt 158.0 lb

## 2015-04-17 DIAGNOSIS — C3411 Malignant neoplasm of upper lobe, right bronchus or lung: Secondary | ICD-10-CM

## 2015-04-17 DIAGNOSIS — J449 Chronic obstructive pulmonary disease, unspecified: Secondary | ICD-10-CM

## 2015-04-17 MED ORDER — ALBUTEROL SULFATE HFA 108 (90 BASE) MCG/ACT IN AERS: 1.0000 | INHALATION_SPRAY | Freq: Four times a day (QID) | RESPIRATORY_TRACT | Status: DC | PRN

## 2015-04-17 NOTE — Progress Notes (Signed)
Patient ID: [name] [name], female    DOB: June 13, 1927, 81 y.o.   MRN: [id number]  HPI 07/01/11- 81 year old female former smoker (100 pack years ) followed for asthma/COPD, allergic rhinitis, chronic sinusitis, remote history lung cancer Last here - January 15, 2011,  Went to her primary office at [facility] for viral illness in June. Had a lot of shortness of breath while in rehab at her neurosurgery office for followup of back pain. Pools liquid in her mouth. Denies easy choking with swallowing; denies smothering when lying down at night.  Continues allergy vaccine. This week using rescue inhaler several times/ day- does help. Advair 250. Finishes prednisone taper in 3 more days- it did help a lot CT chest 11/23/10- RULobectomy, emphysema, NAD.  Plan 6 MWT next visit.   01/24/12-  81 year old female former smoker (100 pack years ) followed for asthma/COPD, allergic rhinitis, chronic sinusitis, remote history lung cancer Has had nerve blocks in cervical spine to relieve occipital pain. No respiratory complaints associated with that. Breathing overall is better. Continues allergy vaccine here at 1:10 without problem. 6 minute walk test today was reviewed with her: 97%, 98%, 97%. Walk only 168 m. Stopped at 3 minutes complaining of dizziness and weakness but able to restart after a short rest. Impression: Limited exercise tolerance with complaints of dizziness and weakness not reflected in blood pressure, heart rate or oxygen levels.  07/28/12- 81 year old female former smoker (100 pack years ) followed for asthma/COPD, allergic rhinitis, chronic sinusitis, remote history lung cancer Still on vaccine 1:10 GH and doing pretty well; ragweed is bothersome at this time. But not much nasal congestion or nose blowing. Mild cough when her lawn is mowed. Being followed by oncology for left upper lobe nodule with CT scan next scheduled June 2014. She feels she is better off continuing Advair 500. Still having  pains in the face and head she associates with pinched nerves in her neck CT chest 04/24/12-we reviewed images IMPRESSION:  1. Status post right upper lobectomy without definite evidence of  local recurrence of disease or new metastatic disease in the  thorax.  2. However, there is a new 4 mm left upper lobe nodule (image 16  of series 5) which is highly nonspecific. However, attention on a  follow-up CT scan in 1 year is recommended. This recommendation  follows the consensus statement: Guidelines for Management of Small  Pulmonary Nodules Detected on CT Scans: A Statement from the  [facility] as published in Radiology 2005; 237:395-400.  3. Atherosclerosis, including left anterior descending and right  coronary artery disease. Please note that although the presence of  coronary artery calcium documents the presence of coronary artery  disease, the severity of this disease and any potential stenosis  cannot be assessed on this non-gated CT examination. Assessment  for potential risk factor modification, dietary therapy or  pharmacologic therapy may be warranted, if clinically indicated.  Original Report Authenticated By: Etheleen Mayhew, M.D.   1939- 81 year old female former smoker (100 pack years ) followed for asthma/COPD, allergic rhinitis, chronic sinusitis, remote history lung cancer/ RULobectomy, lung nodule/ Oncology f/u FOLLOWS GHW:EXHBZ on allergy vaccine 1:10 GH and doing well; denies any flare ups at this time. Feels well controlled now. Using Nasonex. Always somewhat hoarse. Has continued Advair 500 and feels chest is very stable without cough or wheeze.  07/26/13- 81 year old female former smoker (100 pack years ) followed for asthma/COPD, allergic rhinitis, chronic sinusitis, remote history lung cancer/ RULobectomy, lung  nodule/ Oncology f/u FOLLOWS FOR: still on Allergy vaccine 1:10 GO; slightly congested-started using inhaler again. Also slight audible  wheezing. Had flu vaccine. With season change, notices mild hoarseness and more labored breathing with increased use of rescue inhaler. Heart burn wakes her up with famotidine once daily. Pending total knee replacement. Lung nodule was stable - f/u Dr Gwynneth Albright CT 04/30/13 IMPRESSION:  1. Grossly stable postoperative appearance of the chest without  evidence of local recurrence or definite metastatic disease.  2. Scattered small pulmonary nodules are again noted with a new 4  mm nodule in the superior segment of the left lower lobe. The  additional nodules are stable to improved. This new nodule is  nonspecific; continued CT followup should be considered given the  patient's history. This recommendation follows the consensus  statement: Guidelines for Management of Small Pulmonary Nodules  Detected on CT Scans: A Statement from the [place] as  published in Radiology 2005; 237:395-400.  Original Report Authenticated By: Richardean Sale, M.D.  01/25/14- 81 year old female former smoker (100 pack years ) followed for asthma/COPD, allergic rhinitis, chronic sinusitis, remote history lung cancer/ RULobectomy, lung nodule/ Oncology f/u FOLLOWS FOR: still on Allergy vaccine 1:10 GH and doing well. Had recent flare up of coughing and sneezing for 2 days. Pt had PFT 08-2013- results being sought. Not much cough now. Being evaluated for L TKR in May. Needs to change rescue inhaler for insurance. PFT 08/12/13- Mild obstructive airways disease  10/14/14- 81 year old female former smoker (100 pack years ) followed for asthma/COPD, allergic rhinitis, chronic sinusitis, remote history lung cancer/ RULobectomy, lung nodule/ Oncology f/u no new problems Hospitalized in August for pelvic fracture after a fall/hip replacement. Had been missing allergy shots while in hospital and expects to go back again. We discussed stopping allergy vaccine now. CXR 1V 06/10/14 IMPRESSION: 1. No acute  cardiopulmonary process. 2. Hyperinflated lungs with chronic bronchitic markings. Electronically Signed  By: Suzy Bouchard M.D.  On: 06/10/2014 07:30  04/17/15- 81 year old female former smoker (100 pack years ) followed for asthma/COPD, allergic rhinitis, chronic sinusitis, remote history lung cancer/ RULobectomy, lung nodule/ Oncology f/u  Reports: Pt. is doing well when staying indoors. Wheezing and SOB during activity. Would like a RX for Proair Heat outdoors is keeping her indoors in air conditioning and not very active. Cough at night with thick white mucus, mucus and some nasal congestion just started last night. Does not really call it a cold and denies fever or sore throat. Being treated for staph infection left leg  Review of Systems-see HPI Constitutional:   No-   weight loss, night sweats, fevers, chills, fatigue, lassitude. HEENT:   + headaches, no-difficulty swallowing, tooth/dental problems, sore throat,       No-  sneezing, itching, ear ache, nasal congestion, post nasal drip,  CV:  No-   chest pain, orthopnea, PND, swelling in lower extremities, anasarca, dizziness, palpitations Resp: +   shortness of breath with exertion or at rest.              No-   productive cough,  +non-productive cough,  No-  coughing up of blood.              No-   change in color of mucus.  No- wheezing.   Skin: No-   rash or lesions. GI:  No-   heartburn, indigestion, abdominal pain, nausea, vomiting,  GU:   MS:  No-   joint pain or swelling.  .  + back  pain. Neuro- weak and dizzy with exertion Psych:  No- change in mood or affect. No depression or anxiety.  No memory loss.  Objective:   Physical Exam General- Alert, Oriented, Affect-appropriate, Distress- nonacute Skin- +stasis change, pretibial, L>R Lymphadenopathy- none Head- atraumatic            Eyes- Gross vision intact, PERRLA, conjunctivae clear secretions            Ears- Hearing, canals normal            Nose- Clear,  No-Septal dev, mucus, polyps, erosion, perforation             Throat- Mallampati III , mucosa clear , drainage- none, tonsils- atrophic.  Neck- flexible , trachea midline, no stridor , thyroid nl, carotid no bruit Chest - symmetrical excursion , unlabored           Heart/CV- RRR , no murmur , no gallop  , no rub, nl s1 s2  Pulse is normal and regular.                            JVD- none , edema- none, stasis changes- none, varices- none           Lung- +decreased sounds in bases, wheeze- none, cough- none , dullness-none, rub-  none            Chest wall-  Abd-  Br/ Gen/ Rectal- Not done, not indicated Extrem- cyanosis- none, clubbing, none, atrophy- none, strength- nl,                  + dressing on left lower leg Neuro- right facial tic

## 2015-04-17 NOTE — Patient Instructions (Signed)
Script sent refilling the albuterol rescue inhaler  Please call as needed

## 2015-04-18 DIAGNOSIS — E1122 Type 2 diabetes mellitus with diabetic chronic kidney disease: Secondary | ICD-10-CM | POA: Diagnosis not present

## 2015-04-18 DIAGNOSIS — N183 Chronic kidney disease, stage 3 (moderate): Secondary | ICD-10-CM | POA: Diagnosis not present

## 2015-04-18 DIAGNOSIS — L03116 Cellulitis of left lower limb: Secondary | ICD-10-CM | POA: Diagnosis not present

## 2015-04-18 DIAGNOSIS — J449 Chronic obstructive pulmonary disease, unspecified: Secondary | ICD-10-CM | POA: Diagnosis not present

## 2015-04-18 DIAGNOSIS — M1990 Unspecified osteoarthritis, unspecified site: Secondary | ICD-10-CM | POA: Diagnosis not present

## 2015-04-18 DIAGNOSIS — I129 Hypertensive chronic kidney disease with stage 1 through stage 4 chronic kidney disease, or unspecified chronic kidney disease: Secondary | ICD-10-CM | POA: Diagnosis not present

## 2015-04-26 DIAGNOSIS — H538 Other visual disturbances: Secondary | ICD-10-CM | POA: Diagnosis not present

## 2015-04-26 DIAGNOSIS — H3530 Unspecified macular degeneration: Secondary | ICD-10-CM | POA: Diagnosis not present

## 2015-04-28 DIAGNOSIS — J449 Chronic obstructive pulmonary disease, unspecified: Secondary | ICD-10-CM | POA: Diagnosis not present

## 2015-04-28 DIAGNOSIS — E1122 Type 2 diabetes mellitus with diabetic chronic kidney disease: Secondary | ICD-10-CM | POA: Diagnosis not present

## 2015-04-28 DIAGNOSIS — N183 Chronic kidney disease, stage 3 (moderate): Secondary | ICD-10-CM | POA: Diagnosis not present

## 2015-04-28 DIAGNOSIS — I129 Hypertensive chronic kidney disease with stage 1 through stage 4 chronic kidney disease, or unspecified chronic kidney disease: Secondary | ICD-10-CM | POA: Diagnosis not present

## 2015-04-28 DIAGNOSIS — L03116 Cellulitis of left lower limb: Secondary | ICD-10-CM | POA: Diagnosis not present

## 2015-04-28 DIAGNOSIS — M1990 Unspecified osteoarthritis, unspecified site: Secondary | ICD-10-CM | POA: Diagnosis not present

## 2015-05-15 NOTE — Assessment & Plan Note (Addendum)
Mild acute bronchitic exacerbation may be an early cold. We discussed symptomatic/supportive care for now. Refill her rescue inhaler

## 2015-05-15 NOTE — Assessment & Plan Note (Signed)
Continues in remission

## 2015-05-25 ENCOUNTER — Other Ambulatory Visit: Payer: Self-pay | Admitting: Internal Medicine

## 2015-05-25 MED ORDER — MOMETASONE FUROATE 50 MCG/ACT NA SUSP: 2.0000 | Freq: Every day | NASAL | Status: DC

## 2015-06-27 ENCOUNTER — Encounter: Payer: Self-pay | Admitting: Internal Medicine

## 2015-07-03 ENCOUNTER — Telehealth: Payer: Self-pay | Admitting: Internal Medicine

## 2015-07-03 DIAGNOSIS — N183 Chronic kidney disease, stage 3 (moderate): Secondary | ICD-10-CM | POA: Diagnosis not present

## 2015-07-03 DIAGNOSIS — M4806 Spinal stenosis, lumbar region: Secondary | ICD-10-CM | POA: Diagnosis not present

## 2015-07-03 DIAGNOSIS — M1990 Unspecified osteoarthritis, unspecified site: Secondary | ICD-10-CM | POA: Diagnosis not present

## 2015-07-03 DIAGNOSIS — G4700 Insomnia, unspecified: Secondary | ICD-10-CM | POA: Diagnosis not present

## 2015-07-03 DIAGNOSIS — Z1389 Encounter for screening for other disorder: Secondary | ICD-10-CM | POA: Diagnosis not present

## 2015-07-03 DIAGNOSIS — J449 Chronic obstructive pulmonary disease, unspecified: Secondary | ICD-10-CM

## 2015-07-03 DIAGNOSIS — I10 Essential (primary) hypertension: Secondary | ICD-10-CM | POA: Diagnosis not present

## 2015-07-03 DIAGNOSIS — F419 Anxiety disorder, unspecified: Secondary | ICD-10-CM | POA: Diagnosis not present

## 2015-07-03 DIAGNOSIS — C3491 Malignant neoplasm of unspecified part of right bronchus or lung: Secondary | ICD-10-CM | POA: Diagnosis not present

## 2015-07-03 NOTE — Telephone Encounter (Signed)
Ok to Rx nebulizer compressor, # 1                And albuterol .083 neb solu    # 75 ml,  3 ml neb every 6 hours if needed, refill x 11  For dx COPD with asthma

## 2015-07-03 NOTE — Telephone Encounter (Signed)
Spoke with Leafy Ro at [facility] the patient called them stating she ws given samples from our office but she does not have a neb machine to use them in . Leafy Ro states the patient would like to come to their office tomorrow and pick up neb machine. Leafy Ro is aware that I will have CY look into this and advise.    CY appears patient was given Xop 0.63 Rx in 06-2014 by hospital MD; I do not see where patient has been on neb meds since then. No heart related issues to warrant Xop 0.63 Rx. Are you willing to give okay for order for patient to get neb machine and neb medication. Please send response directly to me. Thanks.

## 2015-07-04 MED ORDER — ALBUTEROL SULFATE (2.5 MG/3ML) 0.083% IN NEBU: 2.5000 mg | INHALATION_SOLUTION | Freq: Four times a day (QID) | RESPIRATORY_TRACT | Status: DC

## 2015-07-04 NOTE — Telephone Encounter (Signed)
[name] [name] is aware that order is being placed for neb machine and neb meds. Nothing more needed at this time.

## 2015-07-05 DIAGNOSIS — Z471 Aftercare following joint replacement surgery: Secondary | ICD-10-CM | POA: Diagnosis not present

## 2015-07-05 DIAGNOSIS — Z96641 Presence of right artificial hip joint: Secondary | ICD-10-CM | POA: Diagnosis not present

## 2015-07-05 DIAGNOSIS — Z96652 Presence of left artificial knee joint: Secondary | ICD-10-CM | POA: Diagnosis not present

## 2015-08-08 ENCOUNTER — Other Ambulatory Visit: Payer: Self-pay | Admitting: *Deleted

## 2015-08-08 MED ORDER — FLUTICASONE-SALMETEROL 250-50 MCG/DOSE IN AEPB: INHALATION_SPRAY | RESPIRATORY_TRACT | Status: DC

## 2015-08-18 DIAGNOSIS — K439 Ventral hernia without obstruction or gangrene: Secondary | ICD-10-CM | POA: Diagnosis not present

## 2015-08-18 DIAGNOSIS — J449 Chronic obstructive pulmonary disease, unspecified: Secondary | ICD-10-CM | POA: Diagnosis not present

## 2015-08-18 DIAGNOSIS — M4806 Spinal stenosis, lumbar region: Secondary | ICD-10-CM | POA: Diagnosis not present

## 2015-08-18 DIAGNOSIS — Z23 Encounter for immunization: Secondary | ICD-10-CM | POA: Diagnosis not present

## 2015-08-22 ENCOUNTER — Telehealth: Payer: Self-pay | Admitting: Internal Medicine

## 2015-08-22 NOTE — Telephone Encounter (Signed)
Made pt an appointment for Thursday to see dr young disreguard message.[name] [name]

## 2015-08-23 ENCOUNTER — Other Ambulatory Visit: Payer: Self-pay | Admitting: Internal Medicine

## 2015-08-23 DIAGNOSIS — M545 Low back pain: Secondary | ICD-10-CM

## 2015-08-23 DIAGNOSIS — M48061 Spinal stenosis, lumbar region without neurogenic claudication: Secondary | ICD-10-CM

## 2015-08-24 ENCOUNTER — Ambulatory Visit (INDEPENDENT_AMBULATORY_CARE_PROVIDER_SITE_OTHER): Payer: Medicare Other | Admitting: Internal Medicine

## 2015-08-24 ENCOUNTER — Encounter: Payer: Self-pay | Admitting: Internal Medicine

## 2015-08-24 VITALS — BP 138/72 | HR 68 | Ht 62.0 in | Wt 161.2 lb

## 2015-08-24 DIAGNOSIS — J45909 Unspecified asthma, uncomplicated: Secondary | ICD-10-CM

## 2015-08-24 DIAGNOSIS — C3411 Malignant neoplasm of upper lobe, right bronchus or lung: Secondary | ICD-10-CM

## 2015-08-24 DIAGNOSIS — J449 Chronic obstructive pulmonary disease, unspecified: Secondary | ICD-10-CM | POA: Diagnosis not present

## 2015-08-24 DIAGNOSIS — J441 Chronic obstructive pulmonary disease with (acute) exacerbation: Secondary | ICD-10-CM

## 2015-08-24 MED ORDER — METHYLPREDNISOLONE ACETATE 80 MG/ML IJ SUSP: 80.0000 mg | Freq: Once | INTRAMUSCULAR | Status: DC

## 2015-08-24 NOTE — Progress Notes (Signed)
Patient ID: [name] [name], female    DOB: June 13, 1927, 81 y.o.   MRN: [id number]  HPI 07/01/11- 81 year old female former smoker (100 pack years ) followed for asthma/COPD, allergic rhinitis, chronic sinusitis, remote history lung cancer Last here - January 15, 2011,  Went to her primary office at [facility] NP for viral illness in June. Had a lot of shortness of breath while in rehab at her neurosurgery office for followup of back pain. Pools liquid in her mouth. Denies easy choking with swallowing; denies smothering when lying down at night.  Continues allergy vaccine. This week using rescue inhaler several times/ day- does help. Advair 250. Finishes prednisone taper in 3 more days- it did help a lot CT chest 11/23/10- RULobectomy, emphysema, NAD.  Plan 6 MWT next visit.   01/24/12-  81 year old female former smoker (100 pack years ) followed for asthma/COPD, allergic rhinitis, chronic sinusitis, remote history lung cancer Has had nerve blocks in cervical spine to relieve occipital pain. No respiratory complaints associated with that. Breathing overall is better. Continues allergy vaccine here at 1:10 without problem. 6 minute walk test today was reviewed with her: 97%, 98%, 97%. Walk only 168 m. Stopped at 3 minutes complaining of dizziness and weakness but able to restart after a short rest. Impression: Limited exercise tolerance with complaints of dizziness and weakness not reflected in blood pressure, heart rate or oxygen levels.  07/28/12- 81 year old female former smoker (100 pack years ) followed for asthma/COPD, allergic rhinitis, chronic sinusitis, remote history lung cancer Still on vaccine 1:10 GH and doing pretty well; ragweed is bothersome at this time. But not much nasal congestion or nose blowing. Mild cough when her lawn is mowed. Being followed by oncology for left upper lobe nodule with CT scan next scheduled June 2014. She feels she is better off continuing Advair 500. Still having  pains in the face and head she associates with pinched nerves in her neck CT chest 04/24/12-we reviewed images IMPRESSION:  1. Status post right upper lobectomy without definite evidence of  local recurrence of disease or new metastatic disease in the  thorax.  2. However, there is a new 4 mm left upper lobe nodule (image 16  of series 5) which is highly nonspecific. However, attention on a  follow-up CT scan in 1 year is recommended. This recommendation  follows the consensus statement: Guidelines for Management of Small  Pulmonary Nodules Detected on CT Scans: A Statement from the  [employer] as published in Radiology 2005; 237:395-400.  3. Atherosclerosis, including left anterior descending and right  coronary artery disease. Please note that although the presence of  coronary artery calcium documents the presence of coronary artery  disease, the severity of this disease and any potential stenosis  cannot be assessed on this non-gated CT examination. Assessment  for potential risk factor modification, dietary therapy or  pharmacologic therapy may be warranted, if clinically indicated.  Original Report Authenticated By: Etheleen Mayhew, M.D.   1739- 81 year old female former smoker (100 pack years ) followed for asthma/COPD, allergic rhinitis, chronic sinusitis, remote history lung cancer/ RULobectomy, lung nodule/ Oncology f/u FOLLOWS GHW:EXHBZ on allergy vaccine 1:10 GH and doing well; denies any flare ups at this time. Feels well controlled now. Using Nasonex. Always somewhat hoarse. Has continued Advair 500 and feels chest is very stable without cough or wheeze.  07/26/13- 81 year old female former smoker (100 pack years ) followed for asthma/COPD, allergic rhinitis, chronic sinusitis, remote history lung cancer/ RULobectomy, lung  nodule/ Oncology f/u FOLLOWS FOR: still on Allergy vaccine 1:10 GO; slightly congested-started using inhaler again. Also slight audible  wheezing. Had flu vaccine. With season change, notices mild hoarseness and more labored breathing with increased use of rescue inhaler. Heart burn wakes her up with famotidine once daily. Pending total knee replacement. Lung nodule was stable - f/u Dr Gwynneth Albright CT 04/30/13 IMPRESSION:  1. Grossly stable postoperative appearance of the chest without  evidence of local recurrence or definite metastatic disease.  2. Scattered small pulmonary nodules are again noted with a new 4  mm nodule in the superior segment of the left lower lobe. The  additional nodules are stable to improved. This new nodule is  nonspecific; continued CT followup should be considered given the  patient's history. This recommendation follows the consensus  statement: Guidelines for Management of Small Pulmonary Nodules  Detected on CT Scans: A Statement from the [employer] as  published in Radiology 2005; 237:395-400.  Original Report Authenticated By: Richardean Sale, M.D.  01/25/14- 81 year old female former smoker (100 pack years ) followed for asthma/COPD, allergic rhinitis, chronic sinusitis, remote history lung cancer/ RULobectomy, lung nodule/ Oncology f/u FOLLOWS FOR: still on Allergy vaccine 1:10 GH and doing well. Had recent flare up of coughing and sneezing for 2 days. Pt had PFT 08-2013- results being sought. Not much cough now. Being evaluated for L TKR in May. Needs to change rescue inhaler for insurance. PFT 08/12/13- Mild obstructive airways disease  10/14/14- 81 year old female former smoker (100 pack years ) followed for asthma/COPD, allergic rhinitis, chronic sinusitis, remote history lung cancer/ RULobectomy, lung nodule/ Oncology f/u no new problems Hospitalized in August for pelvic fracture after a fall/hip replacement. Had been missing allergy shots while in hospital and expects to go back again. We discussed stopping allergy vaccine now. CXR 1V 06/10/14 IMPRESSION: 1. No acute  cardiopulmonary process. 2. Hyperinflated lungs with chronic bronchitic markings. Electronically Signed  By: Suzy Bouchard M.D.  On: 06/10/2014 07:30  04/17/15- 81 year old female former smoker (100 pack years ) followed for asthma/COPD, allergic rhinitis, chronic sinusitis, remote history lung cancer/ RULobectomy, lung nodule/ Oncology f/u  Reports: Pt. is doing well when staying indoors. Wheezing and SOB during activity. Would like a RX for Proair Heat outdoors is keeping her indoors in air conditioning and not very active. Cough at night with thick white mucus, mucus and some nasal congestion just started last night. Does not really call it a cold and denies fever or sore throat. Being treated for staph infection left leg  08/24/15- 81 year old female former smoker (100 pack years ) followed for asthma/COPD, allergic rhinitis, chronic sinusitis, remote history lung cancer/ RULobectomy, lung nodule/ Oncology f/u ACUTE VISIT: cough-productive-white and thick, Wheezing and SOB, congestion (tightness in throat and face) for about 2 weeks and gets worse. Was recently evaluated for left lower abdominal pain-suspected hernia PFT 2014 reviewed-mild COPD with insignificant response to bronchodilator  Review of Systems-see HPI Constitutional:   No-   weight loss, night sweats, fevers, chills, fatigue, lassitude. HEENT:   + headaches, no-difficulty swallowing, tooth/dental problems, sore throat,       No-  sneezing, itching, ear ache, nasal congestion, post nasal drip,  CV:  No-   chest pain, orthopnea, PND, swelling in lower extremities, anasarca, dizziness, palpitations Resp: +   shortness of breath with exertion or at rest.              +  productive cough,  +non-productive cough,  No-  coughing up  of blood.              No-   change in color of mucus.  No- wheezing.   Skin: No-   rash or lesions. GI:  No-   heartburn, indigestion, abdominal pain, nausea, vomiting,  GU:   MS:  No-   joint  pain or swelling.  .  + back pain. Neuro- weak and dizzy with exertion Psych:  No- change in mood or affect. No depression or anxiety.  No memory loss.  Objective:   Physical Exam General- Alert, Oriented, Affect-appropriate, Distress- nonacute Skin- +stasis change, pretibial, L>R Lymphadenopathy- none Head- atraumatic            Eyes- Gross vision intact, PERRLA, conjunctivae clear secretions            Ears- Hearing, canals normal            Nose- Clear, No-Septal dev, mucus, polyps, erosion, perforation             Throat- Mallampati III , mucosa clear , drainage- none, tonsils- atrophic.  Neck- flexible , trachea midline, no stridor , thyroid nl, carotid no bruit Chest - symmetrical excursion , unlabored           Heart/CV- RRR , no murmur , no gallop  , no rub, nl s1 s2  Pulse is normal and regular.                            JVD- none , edema- none, stasis changes- none, varices- none           Lung- +decreased sounds in bases, wheeze- none, cough- none , dullness-none, rub-  none            Chest wall-  Abd-  Br/ Gen/ Rectal- Not done, not indicated Extrem- cyanosis- none, clubbing, none, atrophy- none, strength- nl,                  + Compression hose Neuro- right facial tic

## 2015-08-24 NOTE — Patient Instructions (Signed)
Depo 80 for dx COPD acute exacerbation

## 2015-08-27 NOTE — Assessment & Plan Note (Signed)
Acute exacerbation. Slow resolution. Plan-Depo-Medrol

## 2015-08-27 NOTE — Assessment & Plan Note (Signed)
No recurrence or new primary identified so far

## 2015-08-31 ENCOUNTER — Ambulatory Visit
Admission: RE | Admit: 2015-08-31 | Discharge: 2015-08-31 | Disposition: A | Discharge status: Home / Self Care | Payer: Medicare Other | Source: Ambulatory Visit | Attending: Internal Medicine | Admitting: Internal Medicine

## 2015-08-31 DIAGNOSIS — M5126 Other intervertebral disc displacement, lumbar region: Secondary | ICD-10-CM | POA: Diagnosis not present

## 2015-08-31 DIAGNOSIS — M48061 Spinal stenosis, lumbar region without neurogenic claudication: Secondary | ICD-10-CM

## 2015-08-31 DIAGNOSIS — M545 Low back pain: Secondary | ICD-10-CM

## 2015-09-01 ENCOUNTER — Other Ambulatory Visit: Payer: Self-pay | Admitting: Internal Medicine

## 2015-09-01 DIAGNOSIS — M48061 Spinal stenosis, lumbar region without neurogenic claudication: Secondary | ICD-10-CM

## 2015-09-07 ENCOUNTER — Ambulatory Visit
Admission: RE | Admit: 2015-09-07 | Discharge: 2015-09-07 | Disposition: A | Discharge status: Home / Self Care | Payer: Medicare Other | Source: Ambulatory Visit | Attending: Internal Medicine | Admitting: Internal Medicine

## 2015-09-07 DIAGNOSIS — M545 Low back pain: Secondary | ICD-10-CM | POA: Diagnosis not present

## 2015-09-07 DIAGNOSIS — M48061 Spinal stenosis, lumbar region without neurogenic claudication: Secondary | ICD-10-CM

## 2015-09-07 DIAGNOSIS — M47817 Spondylosis without myelopathy or radiculopathy, lumbosacral region: Secondary | ICD-10-CM | POA: Diagnosis not present

## 2015-09-07 MED ORDER — METHYLPREDNISOLONE ACETATE 40 MG/ML INJ SUSP (RADIOLOG
120.0000 mg | Freq: Once | INTRAMUSCULAR | Status: AC
  Administered 2015-09-07: 120 mg via EPIDURAL

## 2015-09-07 MED ORDER — IOHEXOL 180 MG/ML  SOLN
1.0000 mL | Freq: Once | INTRAMUSCULAR | Status: DC | PRN
Start: 2015-09-07 — End: 2015-09-08
  Administered 2015-09-07: 1 mL via EPIDURAL

## 2015-09-07 NOTE — Discharge Instructions (Signed)

## 2015-09-15 ENCOUNTER — Other Ambulatory Visit: Payer: Self-pay | Admitting: *Deleted

## 2015-09-15 ENCOUNTER — Encounter: Payer: Self-pay | Admitting: *Deleted

## 2015-09-15 NOTE — Patient Outreach (Signed)
Screened for [facility] services. Pt is elderly and lives independently. She has not current health issue that is acute. She does have some mobility issues and is at risk for falls. She uses a cane or walker. She has a transfer bench, an elevated commode seat and there is a grab bar in the shower. She requests an emergency alert.  I do not believe she has any needs for active case management at this time. I will refer her for a personal emergercy alert. I will send her our information and a magnet with our 24 hour nurse line number.  I have encouraged her to save my name and number for future reference.  Deloria Lair [facility] [place] [phone]

## 2015-09-19 ENCOUNTER — Other Ambulatory Visit: Payer: Self-pay | Admitting: Internal Medicine

## 2015-09-19 DIAGNOSIS — M48061 Spinal stenosis, lumbar region without neurogenic claudication: Secondary | ICD-10-CM

## 2015-09-27 ENCOUNTER — Ambulatory Visit
Admission: RE | Admit: 2015-09-27 | Discharge: 2015-09-27 | Disposition: A | Discharge status: Home / Self Care | Payer: Medicare Other | Source: Ambulatory Visit | Attending: Internal Medicine | Admitting: Internal Medicine

## 2015-09-27 ENCOUNTER — Other Ambulatory Visit: Payer: Medicare Other

## 2015-09-27 DIAGNOSIS — M545 Low back pain: Secondary | ICD-10-CM | POA: Diagnosis not present

## 2015-09-27 DIAGNOSIS — M48061 Spinal stenosis, lumbar region without neurogenic claudication: Secondary | ICD-10-CM

## 2015-09-27 MED ORDER — METHYLPREDNISOLONE ACETATE 40 MG/ML INJ SUSP (RADIOLOG
120.0000 mg | Freq: Once | INTRAMUSCULAR | Status: AC
  Administered 2015-09-27: 120 mg via EPIDURAL

## 2015-09-27 MED ORDER — IOHEXOL 180 MG/ML  SOLN
1.0000 mL | Freq: Once | INTRAMUSCULAR | Status: DC | PRN
  Administered 2015-09-27: 1 mL via EPIDURAL

## 2015-09-27 NOTE — Discharge Instructions (Signed)

## 2015-10-13 ENCOUNTER — Telehealth: Payer: Self-pay | Admitting: Internal Medicine

## 2015-10-13 DIAGNOSIS — Z1389 Encounter for screening for other disorder: Secondary | ICD-10-CM | POA: Diagnosis not present

## 2015-10-13 DIAGNOSIS — M4806 Spinal stenosis, lumbar region: Secondary | ICD-10-CM | POA: Diagnosis not present

## 2015-10-13 DIAGNOSIS — J449 Chronic obstructive pulmonary disease, unspecified: Secondary | ICD-10-CM | POA: Diagnosis not present

## 2015-10-13 DIAGNOSIS — R7309 Other abnormal glucose: Secondary | ICD-10-CM | POA: Diagnosis not present

## 2015-10-13 DIAGNOSIS — F325 Major depressive disorder, single episode, in full remission: Secondary | ICD-10-CM | POA: Diagnosis not present

## 2015-10-13 DIAGNOSIS — F419 Anxiety disorder, unspecified: Secondary | ICD-10-CM | POA: Diagnosis not present

## 2015-10-13 DIAGNOSIS — E782 Mixed hyperlipidemia: Secondary | ICD-10-CM | POA: Diagnosis not present

## 2015-10-13 DIAGNOSIS — N183 Chronic kidney disease, stage 3 (moderate): Secondary | ICD-10-CM | POA: Diagnosis not present

## 2015-10-13 DIAGNOSIS — Z0000 Encounter for general adult medical examination without abnormal findings: Secondary | ICD-10-CM | POA: Diagnosis not present

## 2015-10-13 DIAGNOSIS — I10 Essential (primary) hypertension: Secondary | ICD-10-CM | POA: Diagnosis not present

## 2015-10-13 DIAGNOSIS — C3491 Malignant neoplasm of unspecified part of right bronchus or lung: Secondary | ICD-10-CM | POA: Diagnosis not present

## 2015-10-13 NOTE — Telephone Encounter (Signed)
[name] [name] from Dr. Lysle Rubens' s office called for appointment. Patient's last appointments was cx due to being in rehab. Patient is currently in Dr. Glenna Durand and was given appointment for lab/MM 12/29 @ 9:15 am via [place] at Dr. Glenna Durand.

## 2015-10-17 ENCOUNTER — Ambulatory Visit: Payer: Medicare Other | Admitting: Internal Medicine

## 2015-11-01 ENCOUNTER — Other Ambulatory Visit: Payer: Self-pay | Admitting: *Deleted

## 2015-11-01 DIAGNOSIS — C3411 Malignant neoplasm of upper lobe, right bronchus or lung: Secondary | ICD-10-CM

## 2015-11-02 ENCOUNTER — Encounter: Payer: Self-pay | Admitting: Internal Medicine

## 2015-11-02 ENCOUNTER — Other Ambulatory Visit (HOSPITAL_BASED_OUTPATIENT_CLINIC_OR_DEPARTMENT_OTHER): Payer: Medicare Other

## 2015-11-02 ENCOUNTER — Ambulatory Visit (HOSPITAL_BASED_OUTPATIENT_CLINIC_OR_DEPARTMENT_OTHER): Payer: Medicare Other | Admitting: Internal Medicine

## 2015-11-02 VITALS — BP 161/55 | HR 68 | Temp 97.8°F | Resp 18 | Ht 62.0 in | Wt 168.8 lb

## 2015-11-02 DIAGNOSIS — Z85118 Personal history of other malignant neoplasm of bronchus and lung: Secondary | ICD-10-CM | POA: Diagnosis not present

## 2015-11-02 DIAGNOSIS — C3411 Malignant neoplasm of upper lobe, right bronchus or lung: Secondary | ICD-10-CM

## 2015-11-02 LAB — COMPREHENSIVE METABOLIC PANEL
ALT: 13 U/L (ref 0–55)
ANION GAP: 8 meq/L (ref 3–11)
AST: 21 U/L (ref 5–34)
Albumin: 3.9 g/dL (ref 3.5–5.0)
Alkaline Phosphatase: 63 U/L (ref 40–150)
BUN: 19.4 mg/dL (ref 7.0–26.0)
CHLORIDE: 106 meq/L (ref 98–109)
CO2: 29 meq/L (ref 22–29)
Calcium: 9.4 mg/dL (ref 8.4–10.4)
Creatinine: 1 mg/dL (ref 0.6–1.1)
EGFR: 54 mL/min/{1.73_m2} — AB (ref >90)
GLUCOSE: 89 mg/dL (ref 70–140)
Potassium: 4.4 mEq/L (ref 3.5–5.1)
SODIUM: 143 meq/L (ref 136–145)
Total Bilirubin: 0.66 mg/dL (ref 0.20–1.20)
Total Protein: 6.9 g/dL (ref 6.4–8.3)

## 2015-11-02 LAB — CBC WITH DIFFERENTIAL/PLATELET
BASO%: 0.7 % (ref 0.0–2.0)
Basophils Absolute: 0 10*3/uL (ref 0.0–0.1)
EOS%: 1.9 % (ref 0.0–7.0)
Eosinophils Absolute: 0.1 10*3/uL (ref 0.0–0.5)
HCT: 37.8 % (ref 34.8–46.6)
HGB: 12.3 g/dL (ref 11.6–15.9)
LYMPH%: 29.2 % (ref 14.0–49.7)
MCH: 31.3 pg (ref 25.1–34.0)
MCHC: 32.5 g/dL (ref 31.5–36.0)
MCV: 96.2 fL (ref 79.5–101.0)
MONO#: 0.4 10*3/uL (ref 0.1–0.9)
MONO%: 7.1 % (ref 0.0–14.0)
NEUT#: 3.6 10*3/uL (ref 1.5–6.5)
NEUT%: 61.1 % (ref 38.4–76.8)
Platelets: 211 10*3/uL (ref 145–400)
RBC: 3.93 10*6/uL (ref 3.70–5.45)
RDW: 14 % (ref 11.2–14.5)
WBC: 5.9 10*3/uL (ref 3.9–10.3)
lymph#: 1.7 10*3/uL (ref 0.9–3.3)

## 2015-11-02 NOTE — Progress Notes (Signed)
[facility] [phone]   Fax:([phone]) [place] [address] [place] [address]  DIAGNOSIS: Stage IB (T2, N0, M0) non-small cell lung cancer, bronchoalveolar carcinoma diagnosed in June of 2006   PRIOR THERAPY:  #1 status post right upper lobectomy with lymph node dissection under the care of Dr. Arlyce Dice on 05/09/2005 and the pathology revealed 4.5 CM bronchoalveolar adenocarcinoma with focal involvement of the visceral pleura.  #2 status post 4 cycles of adjuvant chemotherapy with carboplatin and paclitaxel. Last dose was given 09/17/2005.   CURRENT THERAPY: Observation.  INTERVAL HISTORY: [name] [name] 81 y.o. female returns to the clinic today for annual followup visit. The patient has been on observation for almost 10 years now. The patient has no complaints today except for generalized weakness because of her age. She denied having any significant weight loss or night sweats. She has no chest pain, shortness breath, cough or hemoptysis. She was supposed to have repeat CT scan of the chest before this visit but she did not receive a call regarding the appointment. She had repeat CBC and comprehensive metabolic panel performed earlier today.  MEDICAL HISTORY: Past Medical History  Diagnosis Date  . Chronic obstructive asthma, unspecified   . Allergic rhinitis, cause unspecified   . Angioneurotic edema not elsewhere classified   . Cancer ([place]) 2006    lung ca  . Malignant neoplasm of bronchus and lung, unspecified site   . Environmental allergies   . Headache(784.0)   . Depression   . Arthritis   . Collapsed lung 1959    hx of with chest tube insertion  . Complication of anesthesia     severe headache x1  . UTI (urinary tract infection)     ALLERGIES:  is allergic to aspirin; codeine sulfate; penicillins; and septra ds.  MEDICATIONS:  Current Outpatient Prescriptions  Medication  Sig Dispense Refill  . acetaminophen (TYLENOL) 500 MG tablet Take 500 mg by mouth every 6 (six) hours as needed.    Marland Kitchen albuterol (PROVENTIL HFA;VENTOLIN HFA) 108 (90 BASE) MCG/ACT inhaler Inhale 1-2 puffs into the lungs every 6 (six) hours as needed for wheezing or shortness of breath. 3 Inhaler 3  . amLODipine (NORVASC) 2.5 MG tablet     . CALCIUM-VITAMIN D PO Take 1 capsule by mouth 2 (two) times daily.    . diazepam (VALIUM) 5 MG tablet Take 2.5 mg by mouth at bedtime as needed for anxiety.    . diclofenac sodium (VOLTAREN) 1 % GEL Apply topically 3 (three) times daily as needed.    . Fluticasone-Salmeterol (ADVAIR DISKUS) 250-50 MCG/DOSE AEPB USE 1 INHALATION ORALLY    INTO THE LUNGS TWO TIMES A DAY 180 each 3  . levalbuterol (XOPENEX) 0.63 MG/3ML nebulizer solution Take 3 mLs (0.63 mg total) by nebulization every 6 (six) hours as needed for wheezing or shortness of breath. 3 mL 12  . meloxicam (MOBIC) 15 MG tablet Take 15 mg by mouth daily as needed for pain.    . metoprolol (TOPROL-XL) 50 MG 24 hr tablet Take 50 mg by mouth every morning.     . metroNIDAZOLE (METROGEL) 1 % gel Apply 1 application topically at bedtime as needed.    . mometasone (NASONEX) 50 MCG/ACT nasal spray Place 2 sprays into the nose daily. 51 g 3  . mupirocin ointment (BACTROBAN) 2 % Place 1 application into the nose daily as needed.    Marland Kitchen  Omega-3 Fatty Acids (FISH OIL) 1200 MG CAPS Take 1 capsule by mouth daily.    . Vitamin D, Ergocalciferol, (DRISDOL) 50000 UNITS CAPS capsule Take 50,000 Units by mouth every 30 (thirty) days.     No current facility-administered medications for this visit.    SURGICAL HISTORY:  Past Surgical History  Procedure Laterality Date  . Right upper lobectomy  2006  . Abdominal hysterectomy    . Cholecystectomy  1969  . Closed manipulation shoulder Right 1990's    "frozen shoulder"  . Total knee arthroplasty Left 03/21/2014    Procedure: LEFT TOTAL KNEE ARTHROPLASTY;  Surgeon: Gearlean Alf, MD;  Location: [facility];  Service: Orthopedics;  Laterality: Left;  . Hip arthroplasty Right 06/11/2014    Procedure: RIGHT HIP HEMI ARTHROPLASTY;  Surgeon: Gearlean Alf, MD;  Location: [facility];  Service: Orthopedics;  Laterality: Right;    REVIEW OF SYSTEMS:  A comprehensive review of systems was negative except for: Constitutional: positive for fatigue Musculoskeletal: positive for muscle weakness   PHYSICAL EXAMINATION: General appearance: alert, cooperative and no distress Head: Normocephalic, without obvious abnormality, atraumatic Neck: no adenopathy Lymph nodes: Cervical, supraclavicular, and axillary nodes normal. Resp: clear to auscultation bilaterally Cardio: regular rate and rhythm, S1, S2 normal, no murmur, click, rub or gallop GI: soft, non-tender; bowel sounds normal; no masses,  no organomegaly Extremities: extremities normal, atraumatic, no cyanosis or edema  ECOG PERFORMANCE STATUS: 1 - Symptomatic but completely ambulatory  Blood pressure 161/55, pulse 68, temperature 97.8 F (36.6 C), temperature source Oral, resp. rate 18, height '5\' 2"'$  (1.575 m), weight 168 lb 12.8 oz (76.567 kg), SpO2 95 %.  LABORATORY DATA: Lab Results  Component Value Date   WBC 5.9 11/02/2015   HGB 12.3 11/02/2015   HCT 37.8 11/02/2015   MCV 96.2 11/02/2015   PLT 211 11/02/2015      Chemistry      Component Value Date/Time   NA 143 11/02/2015 0904   NA 140 06/13/2014 0512   NA 138 04/24/2012 1112   K 4.4 11/02/2015 0904   K 4.6 06/13/2014 0512   K 4.5 04/24/2012 1112   CL 100 06/13/2014 0512   CL 94* 04/24/2012 1112   CO2 29 11/02/2015 0904   CO2 31 06/13/2014 0512   CO2 30 04/24/2012 1112   BUN 19.4 11/02/2015 0904   BUN 12 06/13/2014 0512   BUN 22 04/24/2012 1112   CREATININE 1.0 11/02/2015 0904   CREATININE 0.76 06/13/2014 0512   CREATININE 0.7 04/24/2012 1112      Component Value Date/Time   CALCIUM 9.4 11/02/2015 0904   CALCIUM 8.8 06/13/2014 0512   CALCIUM  9.5 04/24/2012 1112   ALKPHOS 63 11/02/2015 0904   ALKPHOS 158* 06/12/2014 0433   ALKPHOS 62 04/24/2012 1112   AST 21 11/02/2015 0904   AST 89* 06/12/2014 0433   AST 24 04/24/2012 1112   ALT 13 11/02/2015 0904   ALT 71* 06/12/2014 0433   ALT 20 04/24/2012 1112   BILITOT 0.66 11/02/2015 0904   BILITOT 0.3 06/12/2014 0433   BILITOT 1.00 04/24/2012 1112       RADIOGRAPHIC STUDIES: Dg Epidurography  09/27/2015  CLINICAL DATA:  Lumbosacral spondylosis without myelopathy. Previous surgical fusion through L5. Only short-term relief from previous caudal epidural steroid injection. Low back pain persists, right greater than left. The procedure, risks, benefits, and alternatives were explained to the patient. Questions regarding the procedure were encouraged and answered. The patient understands and consents to  the procedure. EXAM: LUMBAR EPIDURAL INJECTION UNDER FLUOROSCOPY PROCEDURE: The procedure, risks (including but not limited to bleeding, infection, organ damage ), benefits, and alternatives were explained to the patient. Questions regarding the procedure were encouraged and answered. The patient understands and consents to the procedure. An interlaminar approach was performed on the right at L5-S1. Operator donned sterile gloves and mask. The overlying skin was cleansed and anesthetized. A 20 gauge Crawford epidural needle was advanced using loss-of-resistance technique. Injection of Omnipaque 180 shows a good epidural pattern with spread above and below the level of needle placement. No intrathecal or vascular opacification is seen. '120mg'$  of Depo-Medrol mixed with 78m lidocaine 1% were instilled. The procedure was well-tolerated, and the patient was discharged thirty minutes following the injection in good condition. FLUOROSCOPY TIME:  16 seconds,  83 uGym2 DAP COMPLICATIONS: None IMPRESSION: Technically successful epidural injection on the right at L5-S1. Electronically Signed   By: DLucrezia Europe M.D.   On: 09/27/2015 15:19   ASSESSMENT AND PLAN: This is a very pleasant 81years old white female with history of stage IB non-small cell lung cancer currently on observation with no evidence for disease recurrence. The patient is now 10 years since her last treatment and has been doing fine with no specific complaints. Her lab results are unremarkable today. I recommended for the patient to have repeat CT scan of the chest without contrast the week as a final imaging studies before using her to her primary care physician. If no evidence for disease recurrence, I will discharge the patient from the clinic and she will continue follow-up visit with her primary care physician but I'll be happy to see the patient in the future if there is any concerning findings. She was advised to call immediately if she has any concerning symptoms in the interval. The patient agreed to the current plan.  All questions were answered. The patient knows to call the clinic with any problems, questions or concerns. We can certainly see the patient much sooner if necessary.  Disclaimer: This note was dictated with voice recognition software. Similar sounding words can inadvertently be transcribed and may be missed upon review.

## 2015-11-07 ENCOUNTER — Telehealth: Payer: Self-pay | Admitting: Internal Medicine

## 2015-11-07 MED ORDER — AZITHROMYCIN 250 MG PO TABS: ORAL_TABLET | ORAL | Status: AC

## 2015-11-07 NOTE — Telephone Encounter (Signed)
Spoke with pt. She is aware of CY's recommendations. Rx has been sent in. Nothing further was needed. 

## 2015-11-07 NOTE — Telephone Encounter (Signed)
Offer zpak Recommend Mucinex DM, stay well hydrated and warm

## 2015-11-07 NOTE — Telephone Encounter (Signed)
Spoke with pt. States that she has developed this "bug" that's going around. Reports chest congestion, cough, sneezing and sinus congestion. Cough produces some phlegm but not much. Chest tightness and wheezing are present when she coughs. Denies SOB or fever. Onset was 4 days ago. Would like for something to be called in. CY - please advise. Thanks.  Current Outpatient Prescriptions on File Prior to Visit  Medication Sig Dispense Refill  . acetaminophen (TYLENOL) 500 MG tablet Take 500 mg by mouth every 6 (six) hours as needed.    Marland Kitchen albuterol (PROVENTIL HFA;VENTOLIN HFA) 108 (90 BASE) MCG/ACT inhaler Inhale 1-2 puffs into the lungs every 6 (six) hours as needed for wheezing or shortness of breath. 3 Inhaler 3  . amLODipine (NORVASC) 2.5 MG tablet     . CALCIUM-VITAMIN D PO Take 1 capsule by mouth 2 (two) times daily.    . diazepam (VALIUM) 5 MG tablet Take 2.5 mg by mouth at bedtime as needed for anxiety.    . diclofenac sodium (VOLTAREN) 1 % GEL Apply topically 3 (three) times daily as needed.    . Fluticasone-Salmeterol (ADVAIR DISKUS) 250-50 MCG/DOSE AEPB USE 1 INHALATION ORALLY    INTO THE LUNGS TWO TIMES A DAY 180 each 3  . levalbuterol (XOPENEX) 0.63 MG/3ML nebulizer solution Take 3 mLs (0.63 mg total) by nebulization every 6 (six) hours as needed for wheezing or shortness of breath. 3 mL 12  . meloxicam (MOBIC) 15 MG tablet Take 15 mg by mouth daily as needed for pain.    . metoprolol (TOPROL-XL) 50 MG 24 hr tablet Take 50 mg by mouth every morning.     . metroNIDAZOLE (METROGEL) 1 % gel Apply 1 application topically at bedtime as needed.    . mometasone (NASONEX) 50 MCG/ACT nasal spray Place 2 sprays into the nose daily. 51 g 3  . mupirocin ointment (BACTROBAN) 2 % Place 1 application into the nose daily as needed.    . Omega-3 Fatty Acids (FISH OIL) 1200 MG CAPS Take 1 capsule by mouth daily.    . Vitamin D, Ergocalciferol, (DRISDOL) 50000 UNITS CAPS capsule Take 50,000 Units by  mouth every 30 (thirty) days.     No current facility-administered medications on file prior to visit.   Allergies  Allergen Reactions  . Aspirin Other (See Comments)      GI upset in large amounts  . Codeine Sulfate Other (See Comments)     GI upset, headache  . Penicillins Rash  . Septra Ds [Sulfamethoxazole-Trimethoprim] Rash

## 2015-11-10 ENCOUNTER — Telehealth: Payer: Self-pay | Admitting: Medical Oncology

## 2015-11-10 ENCOUNTER — Ambulatory Visit (HOSPITAL_COMMUNITY): Payer: Medicare Other

## 2015-11-10 NOTE — Telephone Encounter (Signed)
noted 

## 2015-11-17 ENCOUNTER — Ambulatory Visit (HOSPITAL_COMMUNITY): Payer: Medicare Other

## 2015-11-22 ENCOUNTER — Ambulatory Visit (HOSPITAL_COMMUNITY)
Admission: RE | Admit: 2015-11-22 | Discharge: 2015-11-22 | Disposition: A | Discharge status: Home / Self Care | Payer: Medicare Other | Source: Ambulatory Visit | Attending: Internal Medicine | Admitting: Internal Medicine

## 2015-11-22 DIAGNOSIS — Z9889 Other specified postprocedural states: Secondary | ICD-10-CM | POA: Diagnosis not present

## 2015-11-22 DIAGNOSIS — C3411 Malignant neoplasm of upper lobe, right bronchus or lung: Secondary | ICD-10-CM | POA: Diagnosis not present

## 2015-11-22 DIAGNOSIS — C3491 Malignant neoplasm of unspecified part of right bronchus or lung: Secondary | ICD-10-CM | POA: Diagnosis not present

## 2015-11-23 ENCOUNTER — Telehealth: Payer: Self-pay | Admitting: Internal Medicine

## 2015-11-23 MED ORDER — PREDNISONE 10 MG PO TABS: 10.0000 mg | ORAL_TABLET | Freq: Every day | ORAL | Status: DC

## 2015-11-23 NOTE — Telephone Encounter (Signed)
Spoke with pt, c/o unchanged chest tightness, shortness of breath, prod cough with thick white/clear mucus.  Pt was given zpak, recommended to take mucinex DM on 11/07/15. Pt also taking robitussin.  Pt also notes she had a ct chest through Dr. Earlie Server yesterday-wanted to make CY aware.  Pt uses [facility] [address]    CY please advise on further recs.  Thanks!   Allergies  Allergen Reactions  . Aspirin Other (See Comments)      GI upset in large amounts  . Codeine Sulfate Other (See Comments)     GI upset, headache  . Penicillins Rash  . Septra Ds [Sulfamethoxazole-Trimethoprim] Rash   Current Outpatient Prescriptions on File Prior to Visit  Medication Sig Dispense Refill  . acetaminophen (TYLENOL) 500 MG tablet Take 500 mg by mouth every 6 (six) hours as needed.    Marland Kitchen albuterol (PROVENTIL HFA;VENTOLIN HFA) 108 (90 BASE) MCG/ACT inhaler Inhale 1-2 puffs into the lungs every 6 (six) hours as needed for wheezing or shortness of breath. 3 Inhaler 3  . amLODipine (NORVASC) 2.5 MG tablet     . CALCIUM-VITAMIN D PO Take 1 capsule by mouth 2 (two) times daily.    . diazepam (VALIUM) 5 MG tablet Take 2.5 mg by mouth at bedtime as needed for anxiety.    . diclofenac sodium (VOLTAREN) 1 % GEL Apply topically 3 (three) times daily as needed.    . Fluticasone-Salmeterol (ADVAIR DISKUS) 250-50 MCG/DOSE AEPB USE 1 INHALATION ORALLY    INTO THE LUNGS TWO TIMES A DAY 180 each 3  . levalbuterol (XOPENEX) 0.63 MG/3ML nebulizer solution Take 3 mLs (0.63 mg total) by nebulization every 6 (six) hours as needed for wheezing or shortness of breath. 3 mL 12  . meloxicam (MOBIC) 15 MG tablet Take 15 mg by mouth daily as needed for pain.    . metoprolol (TOPROL-XL) 50 MG 24 hr tablet Take 50 mg by mouth every morning.     . metroNIDAZOLE (METROGEL) 1 % gel Apply 1 application topically at bedtime as needed.    . mometasone (NASONEX) 50 MCG/ACT nasal spray Place 2 sprays into the nose daily. 51 g 3  . mupirocin  ointment (BACTROBAN) 2 % Place 1 application into the nose daily as needed.    . Omega-3 Fatty Acids (FISH OIL) 1200 MG CAPS Take 1 capsule by mouth daily.    . Vitamin D, Ergocalciferol, (DRISDOL) 50000 UNITS CAPS capsule Take 50,000 Units by mouth every 30 (thirty) days.     No current facility-administered medications on file prior to visit.

## 2015-11-23 NOTE — Telephone Encounter (Signed)
The CT shows emphysema and a new spot that may just be inflammation. Nothing is seen to cause new cough and shortness of breath.  Suggest Rx prednisone 10 mg, # 15, 1 daily

## 2015-11-23 NOTE — Telephone Encounter (Signed)
Pt is aware of CY's response. Rx has been sent in. Nothing further was needed.

## 2016-02-12 DIAGNOSIS — F325 Major depressive disorder, single episode, in full remission: Secondary | ICD-10-CM | POA: Diagnosis not present

## 2016-02-12 DIAGNOSIS — J449 Chronic obstructive pulmonary disease, unspecified: Secondary | ICD-10-CM | POA: Diagnosis not present

## 2016-02-12 DIAGNOSIS — E782 Mixed hyperlipidemia: Secondary | ICD-10-CM | POA: Diagnosis not present

## 2016-02-12 DIAGNOSIS — M4800 Spinal stenosis, site unspecified: Secondary | ICD-10-CM | POA: Diagnosis not present

## 2016-02-12 DIAGNOSIS — M519 Unspecified thoracic, thoracolumbar and lumbosacral intervertebral disc disorder: Secondary | ICD-10-CM | POA: Diagnosis not present

## 2016-02-12 DIAGNOSIS — I10 Essential (primary) hypertension: Secondary | ICD-10-CM | POA: Diagnosis not present

## 2016-02-12 DIAGNOSIS — C3490 Malignant neoplasm of unspecified part of unspecified bronchus or lung: Secondary | ICD-10-CM | POA: Diagnosis not present

## 2016-02-14 ENCOUNTER — Other Ambulatory Visit: Payer: Self-pay | Admitting: Internal Medicine

## 2016-02-17 DIAGNOSIS — M5136 Other intervertebral disc degeneration, lumbar region: Secondary | ICD-10-CM | POA: Diagnosis not present

## 2016-02-21 ENCOUNTER — Other Ambulatory Visit: Payer: Self-pay | Admitting: Orthopedic Surgery

## 2016-02-21 DIAGNOSIS — M5136 Other intervertebral disc degeneration, lumbar region: Secondary | ICD-10-CM

## 2016-02-22 ENCOUNTER — Ambulatory Visit (INDEPENDENT_AMBULATORY_CARE_PROVIDER_SITE_OTHER): Payer: Medicare Other | Admitting: Internal Medicine

## 2016-02-22 ENCOUNTER — Encounter: Payer: Self-pay | Admitting: Internal Medicine

## 2016-02-22 VITALS — BP 126/70 | HR 76 | Ht 62.0 in | Wt 167.0 lb

## 2016-02-22 DIAGNOSIS — C3411 Malignant neoplasm of upper lobe, right bronchus or lung: Secondary | ICD-10-CM | POA: Diagnosis not present

## 2016-02-22 DIAGNOSIS — J449 Chronic obstructive pulmonary disease, unspecified: Secondary | ICD-10-CM

## 2016-02-22 MED ORDER — ALBUTEROL SULFATE HFA 108 (90 BASE) MCG/ACT IN AERS: INHALATION_SPRAY | RESPIRATORY_TRACT | Status: DC

## 2016-02-22 NOTE — Progress Notes (Signed)
Patient ID: [name] [name], female    DOB: June 13, 1927, 82 y.o.   MRN: [id number]  HPI 07/01/11- 82 year old female former smoker (100 pack years ) followed for asthma/COPD, allergic rhinitis, chronic sinusitis, remote history lung cancer Last here - January 15, 2011,  Went to her primary office at [facility] NP for viral illness in June. Had a lot of shortness of breath while in rehab at her neurosurgery office for followup of back pain. Pools liquid in her mouth. Denies easy choking with swallowing; denies smothering when lying down at night.  Continues allergy vaccine. This week using rescue inhaler several times/ day- does help. Advair 250. Finishes prednisone taper in 3 more days- it did help a lot CT chest 11/23/10- RULobectomy, emphysema, NAD.  Plan 6 MWT next visit.   01/24/12-  82 year old female former smoker (100 pack years ) followed for asthma/COPD, allergic rhinitis, chronic sinusitis, remote history lung cancer Has had nerve blocks in cervical spine to relieve occipital pain. No respiratory complaints associated with that. Breathing overall is better. Continues allergy vaccine here at 1:10 without problem. 6 minute walk test today was reviewed with her: 97%, 98%, 97%. Walk only 168 m. Stopped at 3 minutes complaining of dizziness and weakness but able to restart after a short rest. Impression: Limited exercise tolerance with complaints of dizziness and weakness not reflected in blood pressure, heart rate or oxygen levels.  07/28/12- 82 year old female former smoker (100 pack years ) followed for asthma/COPD, allergic rhinitis, chronic sinusitis, remote history lung cancer Still on vaccine 1:10 GH and doing pretty well; ragweed is bothersome at this time. But not much nasal congestion or nose blowing. Mild cough when her lawn is mowed. Being followed by oncology for left upper lobe nodule with CT scan next scheduled June 2014. She feels she is better off continuing Advair 500. Still having  pains in the face and head she associates with pinched nerves in her neck CT chest 04/24/12-we reviewed images IMPRESSION:  1. Status post right upper lobectomy without definite evidence of  local recurrence of disease or new metastatic disease in the  thorax.  2. However, there is a new 4 mm left upper lobe nodule (image 16  of series 5) which is highly nonspecific. However, attention on a  follow-up CT scan in 1 year is recommended. This recommendation  follows the consensus statement: Guidelines for Management of Small  Pulmonary Nodules Detected on CT Scans: A Statement from the  [employer] as published in Radiology 2005; 237:395-400.  3. Atherosclerosis, including left anterior descending and right  coronary artery disease. Please note that although the presence of  coronary artery calcium documents the presence of coronary artery  disease, the severity of this disease and any potential stenosis  cannot be assessed on this non-gated CT examination. Assessment  for potential risk factor modification, dietary therapy or  pharmacologic therapy may be warranted, if clinically indicated.  Original Report Authenticated By: Etheleen Mayhew, M.D.   1739- 82 year old female former smoker (100 pack years ) followed for asthma/COPD, allergic rhinitis, chronic sinusitis, remote history lung cancer/ RULobectomy, lung nodule/ Oncology f/u FOLLOWS GHW:EXHBZ on allergy vaccine 1:10 GH and doing well; denies any flare ups at this time. Feels well controlled now. Using Nasonex. Always somewhat hoarse. Has continued Advair 500 and feels chest is very stable without cough or wheeze.  07/26/13- 82 year old female former smoker (100 pack years ) followed for asthma/COPD, allergic rhinitis, chronic sinusitis, remote history lung cancer/ RULobectomy, lung  nodule/ Oncology f/u FOLLOWS FOR: still on Allergy vaccine 1:10 GO; slightly congested-started using inhaler again. Also slight audible  wheezing. Had flu vaccine. With season change, notices mild hoarseness and more labored breathing with increased use of rescue inhaler. Heart burn wakes her up with famotidine once daily. Pending total knee replacement. Lung nodule was stable - f/u Dr Gwynneth Albright CT 04/30/13 IMPRESSION:  1. Grossly stable postoperative appearance of the chest without  evidence of local recurrence or definite metastatic disease.  2. Scattered small pulmonary nodules are again noted with a new 4  mm nodule in the superior segment of the left lower lobe. The  additional nodules are stable to improved. This new nodule is  nonspecific; continued CT followup should be considered given the  patient's history. This recommendation follows the consensus  statement: Guidelines for Management of Small Pulmonary Nodules  Detected on CT Scans: A Statement from the [employer] as  published in Radiology 2005; 237:395-400.  Original Report Authenticated By: Richardean Sale, M.D.  01/25/14- 82 year old female former smoker (100 pack years ) followed for asthma/COPD, allergic rhinitis, chronic sinusitis, remote history lung cancer/ RULobectomy, lung nodule/ Oncology f/u FOLLOWS FOR: still on Allergy vaccine 1:10 GH and doing well. Had recent flare up of coughing and sneezing for 2 days. Pt had PFT 08-2013- results being sought. Not much cough now. Being evaluated for L TKR in May. Needs to change rescue inhaler for insurance. PFT 08/12/13- Mild obstructive airways disease  10/14/14- 82 year old female former smoker (100 pack years ) followed for asthma/COPD, allergic rhinitis, chronic sinusitis, remote history lung cancer/ RULobectomy, lung nodule/ Oncology f/u no new problems Hospitalized in August for pelvic fracture after a fall/hip replacement. Had been missing allergy shots while in hospital and expects to go back again. We discussed stopping allergy vaccine now. CXR 1V 06/10/14 IMPRESSION: 1. No acute  cardiopulmonary process. 2. Hyperinflated lungs with chronic bronchitic markings. Electronically Signed  By: Suzy Bouchard M.D.  On: 06/10/2014 07:30  04/17/15- 82 year old female former smoker (100 pack years ) followed for asthma/COPD, allergic rhinitis, chronic sinusitis, remote history lung cancer/ RULobectomy, lung nodule/ Oncology f/u  Reports: Pt. is doing well when staying indoors. Wheezing and SOB during activity. Would like a RX for Proair Heat outdoors is keeping her indoors in air conditioning and not very active. Cough at night with thick white mucus, mucus and some nasal congestion just started last night. Does not really call it a cold and denies fever or sore throat. Being treated for staph infection left leg  08/24/15- 82 year old female former smoker (100 pack years ) followed for asthma/COPD, allergic rhinitis, chronic sinusitis, remote history lung cancer/ RULobectomy, lung nodule/ Oncology f/u ACUTE VISIT: cough-productive-white and thick, Wheezing and SOB, congestion (tightness in throat and face) for about 2 weeks and gets worse. Was recently evaluated for left lower abdominal pain-suspected hernia PFT 2014 reviewed-mild COPD with insignificant response to bronchodilator  02/22/2016-82 year old female former smoker (100 pack years) followed for asthma/COPD, allergic rhinitis, chronic sinusitis, remote history lung cancer/right upper lobectomy, lung nodule/oncology follow-up FOLLOWS FOR: Pt states she has been doing well but started wheezing again this morning. She says she only wheeze today from rushing to get here on time. Usually feels adequately controlled using her nebulizer from 1-3 times daily. We reviewed medications. CT chest 11/22/2015-ordered and followed by her oncologist IMPRESSION: 1. Interval development of a small area of sub solid opacity in the left apex, likely infectious or inflammatory airspace disease. Continued attention to this area  on followup  imaging is recommended. 2. Previously described tiny left lung nodules are unchanged. 3. Postsurgical changes in the right hemi thorax compatible with upper lobectomy Electronically Signed  By: Misty Stanley M.D.  On: 11/22/2015 14:11    Review of Systems-see HPI Constitutional:   No-   weight loss, night sweats, fevers, chills, fatigue, lassitude. HEENT:   + headaches, no-difficulty swallowing, tooth/dental problems, sore throat,       No-  sneezing, itching, ear ache, nasal congestion, post nasal drip,  CV:  No-   chest pain, orthopnea, PND, swelling in lower extremities, anasarca, dizziness, palpitations Resp: +   shortness of breath with exertion or at rest.              +  productive cough,  +non-productive cough,  No-  coughing up of blood.              No-   change in color of mucus.  + wheezing.   Skin: No-   rash or lesions. GI:  No-   heartburn, indigestion, abdominal pain, nausea, vomiting,  GU:   MS:  No-   joint pain or swelling.  .  + back pain. Neuro- weak and dizzy with exertion Psych:  No- change in mood or affect. No depression or anxiety.  No memory loss.  Objective:   Physical Exam General- Alert, Oriented, Affect-appropriate, Distress- nonacute, + Portable oxygen Skin- +stasis change, pretibial, L>R Lymphadenopathy- none Head- atraumatic            Eyes- Gross vision intact, PERRLA, conjunctivae clear secretions            Ears- Hearing, canals normal            Nose- Clear, No-Septal dev, mucus, polyps, erosion, perforation             Throat- Mallampati III , mucosa clear , drainage- none, tonsils- atrophic.  Neck- flexible , trachea midline, no stridor , thyroid nl, carotid no bruit Chest - symmetrical excursion , unlabored           Heart/CV- RRR , no murmur , no gallop  , no rub, nl s1 s2  Pulse is normal and regular.                            JVD- none , edema- none, stasis changes- none, varices- none           Lung- +decreased sounds in bases,  wheeze- none, cough- none , dullness-none, rub-  none            Chest wall-  Abd-  Br/ Gen/ Rectal- Not done, not indicated Extrem- cyanosis- none, clubbing, none, atrophy- none, strength- nl,                  + Compression hose Neuro- right facial tic

## 2016-02-22 NOTE — Patient Instructions (Signed)
It sounds as if your medicines are controlling your breathing pretty well.  Please call us as needed

## 2016-02-24 NOTE — Assessment & Plan Note (Signed)
Oncology is tracking CT scan

## 2016-02-24 NOTE — Assessment & Plan Note (Signed)
She notices wheezing and dyspnea with exertion, but feels she has the tools to manage it okay.

## 2016-03-05 ENCOUNTER — Other Ambulatory Visit: Payer: Medicare Other

## 2016-03-19 ENCOUNTER — Ambulatory Visit
Admission: RE | Admit: 2016-03-19 | Discharge: 2016-03-19 | Disposition: A | Discharge status: Home / Self Care | Payer: Medicare Other | Source: Ambulatory Visit | Attending: Orthopedic Surgery | Admitting: Orthopedic Surgery

## 2016-03-19 DIAGNOSIS — M5136 Other intervertebral disc degeneration, lumbar region: Secondary | ICD-10-CM

## 2016-03-19 DIAGNOSIS — M4806 Spinal stenosis, lumbar region: Secondary | ICD-10-CM | POA: Diagnosis not present

## 2016-03-19 MED ORDER — IOPAMIDOL (ISOVUE-M 200) INJECTION 41%
15.0000 mL | Freq: Once | INTRAMUSCULAR | Status: AC
  Administered 2016-03-19: 15 mL via INTRATHECAL

## 2016-03-19 NOTE — Discharge Instructions (Signed)

## 2016-03-22 DIAGNOSIS — M4696 Unspecified inflammatory spondylopathy, lumbar region: Secondary | ICD-10-CM | POA: Diagnosis not present

## 2016-05-15 DIAGNOSIS — R21 Rash and other nonspecific skin eruption: Secondary | ICD-10-CM | POA: Diagnosis not present

## 2016-05-15 DIAGNOSIS — N390 Urinary tract infection, site not specified: Secondary | ICD-10-CM | POA: Diagnosis not present

## 2016-06-13 DIAGNOSIS — F325 Major depressive disorder, single episode, in full remission: Secondary | ICD-10-CM | POA: Diagnosis not present

## 2016-06-13 DIAGNOSIS — J449 Chronic obstructive pulmonary disease, unspecified: Secondary | ICD-10-CM | POA: Diagnosis not present

## 2016-06-13 DIAGNOSIS — C3491 Malignant neoplasm of unspecified part of right bronchus or lung: Secondary | ICD-10-CM | POA: Diagnosis not present

## 2016-06-13 DIAGNOSIS — N183 Chronic kidney disease, stage 3 (moderate): Secondary | ICD-10-CM | POA: Diagnosis not present

## 2016-06-13 DIAGNOSIS — I10 Essential (primary) hypertension: Secondary | ICD-10-CM | POA: Diagnosis not present

## 2016-07-19 DIAGNOSIS — H0012 Chalazion right lower eyelid: Secondary | ICD-10-CM | POA: Diagnosis not present

## 2016-07-29 ENCOUNTER — Other Ambulatory Visit: Payer: Self-pay | Admitting: Internal Medicine

## 2016-07-29 MED ORDER — FLUTICASONE-SALMETEROL 250-50 MCG/DOSE IN AEPB: INHALATION_SPRAY | RESPIRATORY_TRACT | 3 refills | Status: DC

## 2016-07-29 NOTE — Telephone Encounter (Signed)
Per CY okay to refill.  

## 2016-08-05 ENCOUNTER — Other Ambulatory Visit: Payer: Self-pay | Admitting: Internal Medicine

## 2016-08-26 ENCOUNTER — Encounter: Payer: Self-pay | Admitting: Internal Medicine

## 2016-08-26 ENCOUNTER — Ambulatory Visit (INDEPENDENT_AMBULATORY_CARE_PROVIDER_SITE_OTHER): Payer: Medicare Other | Admitting: Internal Medicine

## 2016-08-26 DIAGNOSIS — Z23 Encounter for immunization: Secondary | ICD-10-CM | POA: Diagnosis not present

## 2016-08-26 DIAGNOSIS — J322 Chronic ethmoidal sinusitis: Secondary | ICD-10-CM | POA: Diagnosis not present

## 2016-08-26 DIAGNOSIS — J449 Chronic obstructive pulmonary disease, unspecified: Secondary | ICD-10-CM | POA: Diagnosis not present

## 2016-08-26 NOTE — Progress Notes (Signed)
Patient ID: [name] [name], female    DOB: 01-Oct-1927, 82 y.o.   MRN: [id number]  HPI Female former smoker (100 pack years ) followed for asthma/COPD, allergic rhinitis, chronic sinusitis, remote history lung cancer  08/24/15- 82 year old female former smoker (100 pack years ) followed for asthma/COPD, allergic rhinitis, chronic sinusitis, remote history lung cancer/ RULobectomy, lung nodule/ Oncology f/u ACUTE VISIT: cough-productive-white and thick, Wheezing and SOB, congestion (tightness in throat and face) for about 2 weeks and gets worse. Was recently evaluated for left lower abdominal pain-suspected hernia PFT 2014 reviewed-mild COPD with insignificant response to bronchodilator  02/22/2016-82 year old female former smoker (100 pack years) followed for asthma/COPD, allergic rhinitis, chronic sinusitis, remote history lung cancer/right upper lobectomy, lung nodule/oncology follow-up FOLLOWS FOR: Pt states she has been doing well but started wheezing again this morning. She says she only wheeze today from rushing to get here on time. Usually feels adequately controlled using her nebulizer from 1-3 times daily. We reviewed medications. CT chest 11/22/2015-ordered and followed by her oncologist IMPRESSION: 1. Interval development of a small area of sub solid opacity in the left apex, likely infectious or inflammatory airspace disease. Continued attention to this area on followup imaging is recommended. 2. Previously described tiny left lung nodules are unchanged. 3. Postsurgical changes in the right hemi thorax compatible with upper lobectomy Electronically Signed  By: Misty Stanley M.D.  On: 11/22/2015 14:11  08/26/2016-82 year old female former smoker followed for asthma/COPD, allergic rhinitis, chronic sinusitis, remote history lung cancer/right upper lobectomy, lung nodule/oncology follow-up She continues to follow with Dr. Mohammed/Oncology for remote RUL adenoca/right upper  lobe resection Follows For: Increased sob with activity -Prod cough (white, thick), sneezing - Symptoms worse since this past summer Very nonspecific about respiratory status this year. Some mild rash has come and gone on her face possibly associated with exposure to a cat-no longer with her. Right eye irritation with follow-up scheduled with opth soon. Uses nebulizer machine/Xopenex, albuterol rescue inhaler about once daily-helps with cough. Admits very sedentary with no routine exertion.  Review of Systems-see HPI Constitutional:   No-   weight loss, night sweats, fevers, chills, fatigue, lassitude. HEENT:   + headaches, no-difficulty swallowing, tooth/dental problems, sore throat,       No-  sneezing, itching, ear ache, nasal congestion, post nasal drip,  CV:  No-   chest pain, orthopnea, PND, swelling in lower extremities, anasarca, dizziness, palpitations Resp: +   shortness of breath with exertion or at rest.              +  productive cough,  +non-productive cough,  No-  coughing up of blood.              No-   change in color of mucus.  + wheezing.   Skin: No-   rash or lesions. GI:  No-   heartburn, indigestion, abdominal pain, nausea, vomiting,  GU:   MS:  No-   joint pain or swelling.  .  + back pain. Neuro- weak and dizzy with exertion Psych:  No- change in mood or affect. No depression or anxiety.  No memory loss.  Objective:   Physical Exam General- Alert, Oriented, Affect-appropriate, Distress- nonacute, + Portable oxygen Skin- +stasis change, pretibial, L>R Lymphadenopathy- none Head- atraumatic            Eyes- Gross vision intact, PERRLA, conjunctivae clear secretions            Ears- Hearing, canals normal  Nose- Clear, No-Septal dev, mucus, polyps, erosion, perforation             Throat- Mallampati III , mucosa clear , drainage- none, tonsils- atrophic.  Neck- flexible , trachea midline, no stridor , thyroid nl, carotid no bruit Chest - symmetrical  excursion , unlabored           Heart/CV- RRR , no murmur , no gallop  , no rub, nl s1 s2  Pulse is normal and regular.                            JVD- none , edema- none, stasis changes- none, varices- none           Lung- +decreased sounds in bases, wheeze- none, cough- none , dullness-none, rub-  none            Chest wall-  Abd-  Br/ Gen/ Rectal- Not done, not indicated Extrem- cyanosis- none, clubbing, none, atrophy- none, strength- nl,                  + Compression hose Neuro- right facial tic

## 2016-08-26 NOTE — Assessment & Plan Note (Signed)
Notices some stuffiness from time to time and a little postnasal drip but no headache or acute symptoms.

## 2016-08-26 NOTE — Patient Instructions (Signed)
Flu vax  Ok to continue use your rescue inhaler and your nebulizer machine as needed  Follow up with Dr Lysle Rubens and Dr Julien Nordmann as directed  Please call if we can help

## 2016-08-26 NOTE — Assessment & Plan Note (Signed)
She is not clear about Dr. Worthy Flank follow-up intentions with her. I suggested she call the office for clarification.

## 2016-08-26 NOTE — Assessment & Plan Note (Signed)
She thinks she is somewhat more dyspneic with exertion over the past year but no specific or acute events. Did not desaturate significantly on 6 minute walk in the past. We discussed her inhaled medications with no changes to offer. Plan-flu vaccine

## 2016-08-27 DIAGNOSIS — H04123 Dry eye syndrome of bilateral lacrimal glands: Secondary | ICD-10-CM | POA: Diagnosis not present

## 2016-09-04 ENCOUNTER — Other Ambulatory Visit: Payer: Self-pay | Admitting: Internal Medicine

## 2016-10-04 ENCOUNTER — Other Ambulatory Visit: Payer: Self-pay | Admitting: Internal Medicine

## 2016-10-21 DIAGNOSIS — M48061 Spinal stenosis, lumbar region without neurogenic claudication: Secondary | ICD-10-CM | POA: Diagnosis not present

## 2016-10-21 DIAGNOSIS — I10 Essential (primary) hypertension: Secondary | ICD-10-CM | POA: Diagnosis not present

## 2016-10-21 DIAGNOSIS — Z23 Encounter for immunization: Secondary | ICD-10-CM | POA: Diagnosis not present

## 2016-10-21 DIAGNOSIS — C3491 Malignant neoplasm of unspecified part of right bronchus or lung: Secondary | ICD-10-CM | POA: Diagnosis not present

## 2016-10-21 DIAGNOSIS — Z1389 Encounter for screening for other disorder: Secondary | ICD-10-CM | POA: Diagnosis not present

## 2016-10-21 DIAGNOSIS — N183 Chronic kidney disease, stage 3 (moderate): Secondary | ICD-10-CM | POA: Diagnosis not present

## 2016-10-21 DIAGNOSIS — F325 Major depressive disorder, single episode, in full remission: Secondary | ICD-10-CM | POA: Diagnosis not present

## 2016-10-21 DIAGNOSIS — L719 Rosacea, unspecified: Secondary | ICD-10-CM | POA: Diagnosis not present

## 2016-10-21 DIAGNOSIS — Z0000 Encounter for general adult medical examination without abnormal findings: Secondary | ICD-10-CM | POA: Diagnosis not present

## 2016-10-21 DIAGNOSIS — R7309 Other abnormal glucose: Secondary | ICD-10-CM | POA: Diagnosis not present

## 2016-10-21 DIAGNOSIS — J449 Chronic obstructive pulmonary disease, unspecified: Secondary | ICD-10-CM | POA: Diagnosis not present

## 2016-10-21 DIAGNOSIS — E782 Mixed hyperlipidemia: Secondary | ICD-10-CM | POA: Diagnosis not present

## 2016-10-23 ENCOUNTER — Telehealth: Payer: Self-pay | Admitting: Internal Medicine

## 2016-10-23 NOTE — Telephone Encounter (Signed)
Referral received in HIM from Preston at [facility] requesting appointment for an established patient. Per response from MM to HIM schedule patient for next available opening.   Left message for patient re f/u 11/05/16. Schedule mailed.

## 2016-11-04 ENCOUNTER — Other Ambulatory Visit: Payer: Self-pay | Admitting: Internal Medicine

## 2016-11-05 ENCOUNTER — Ambulatory Visit (HOSPITAL_BASED_OUTPATIENT_CLINIC_OR_DEPARTMENT_OTHER): Payer: Medicare Other | Admitting: Internal Medicine

## 2016-11-05 ENCOUNTER — Encounter: Payer: Self-pay | Admitting: Internal Medicine

## 2016-11-05 VITALS — BP 163/81 | HR 69 | Temp 98.2°F | Resp 16 | Wt 167.0 lb

## 2016-11-05 DIAGNOSIS — C3411 Malignant neoplasm of upper lobe, right bronchus or lung: Secondary | ICD-10-CM

## 2016-11-05 DIAGNOSIS — J449 Chronic obstructive pulmonary disease, unspecified: Secondary | ICD-10-CM

## 2016-11-05 DIAGNOSIS — Z85118 Personal history of other malignant neoplasm of bronchus and lung: Secondary | ICD-10-CM

## 2016-11-05 NOTE — Progress Notes (Signed)
[name] [name]) [phone]   Fax:([phone]) [place] [address] [employer] [address] [address]  DIAGNOSIS: Stage IB (T2, N0, M0) non-small cell lung cancer, bronchoalveolar carcinoma diagnosed in June 2006.  PRIOR THERAPY: #1 status post right upper lobectomy with lymph node dissection under the care of Dr. Arlyce Dice on 05/09/2005 and the pathology revealed 4.5 CM bronchoalveolar adenocarcinoma with focal involvement of the visceral pleura.  #2 status post 4 cycles of adjuvant chemotherapy with carboplatin and paclitaxel. Last dose was given 09/17/2005.   CURRENT THERAPY: Observation.  INTERVAL HISTORY: [name] [name] 83 y.o. female returns to the clinic today for annual follow-up visit. The patient was seen in January 2017 and she was feeling fine with no concerning findings at that time. She has no complaints today except for fatigue and aching pain. She denied having any chest pain, shortness of breath, cough or hemoptysis. She has no fever or chills. She denied having any weight loss or night sweats. She is here today for evaluation of her condition.  MEDICAL HISTORY: Past Medical History:  Diagnosis Date  . Allergic rhinitis, cause unspecified   . Angioneurotic edema not elsewhere classified   . Arthritis   . Cancer ([place]) 2006   lung ca  . Chronic obstructive asthma, unspecified   . Collapsed lung 1959   hx of with chest tube insertion  . Complication of anesthesia    severe headache x1  . Depression   . Environmental allergies   . Headache(784.0)   . Malignant neoplasm of bronchus and lung, unspecified site   . UTI (urinary tract infection)     ALLERGIES:  is allergic to aspirin; codeine sulfate; penicillins; and septra ds [sulfamethoxazole-trimethoprim].  MEDICATIONS:  Current Outpatient Prescriptions  Medication Sig Dispense Refill  . acetaminophen (TYLENOL) 500 MG tablet Take 500 mg by mouth  every 6 (six) hours as needed.    Marland Kitchen albuterol (PROVENTIL HFA) 108 (90 Base) MCG/ACT inhaler USE 1 TO 2 INHALATIONS     ORALLY EVERY 6 HOURS AS    NEEDED FOR WHEEZING OR     SHORTNESS OF BREATH 2 Inhaler 3  . amLODipine (NORVASC) 2.5 MG tablet     . CALCIUM-VITAMIN D PO Take 1 capsule by mouth 2 (two) times daily.    . diazepam (VALIUM) 5 MG tablet Take 2.5 mg by mouth at bedtime as needed for anxiety.    . diclofenac sodium (VOLTAREN) 1 % GEL Apply topically 3 (three) times daily as needed.    . Fluticasone-Salmeterol (ADVAIR DISKUS) 250-50 MCG/DOSE AEPB USE 1 INHALATION ORALLY    INTO THE LUNGS TWO TIMES A DAY 180 each 3  . levalbuterol (XOPENEX) 0.63 MG/3ML nebulizer solution Take 3 mLs (0.63 mg total) by nebulization every 6 (six) hours as needed for wheezing or shortness of breath. 3 mL 12  . meloxicam (MOBIC) 15 MG tablet Take 15 mg by mouth daily as needed for pain.    . metoprolol (TOPROL-XL) 50 MG 24 hr tablet Take 50 mg by mouth every morning.     . metroNIDAZOLE (METROGEL) 1 % gel Apply 1 application topically at bedtime as needed.    . mometasone (NASONEX) 50 MCG/ACT nasal spray Place 2 sprays into the nose daily. 17 g 3  . Omega-3 Fatty Acids (FISH OIL) 1200 MG CAPS Take 1 capsule by mouth daily.    . Vitamin D, Ergocalciferol, (DRISDOL) 50000 UNITS CAPS capsule Take  50,000 Units by mouth every 30 (thirty) days.     No current facility-administered medications for this visit.     SURGICAL HISTORY:  Past Surgical History:  Procedure Laterality Date  . ABDOMINAL HYSTERECTOMY    . CHOLECYSTECTOMY  1969  . CLOSED MANIPULATION SHOULDER Right 1990's   "frozen shoulder"  . HIP ARTHROPLASTY Right 06/11/2014   Procedure: RIGHT HIP HEMI ARTHROPLASTY;  Surgeon: Gearlean Alf, MD;  Location: [facility];  Service: Orthopedics;  Laterality: Right;  . right upper lobectomy  2006  . TOTAL KNEE ARTHROPLASTY Left 03/21/2014   Procedure: LEFT TOTAL KNEE ARTHROPLASTY;  Surgeon: Gearlean Alf, MD;   Location: [facility];  Service: Orthopedics;  Laterality: Left;    REVIEW OF SYSTEMS:  A comprehensive review of systems was negative except for: Constitutional: positive for fatigue   PHYSICAL EXAMINATION: General appearance: alert, cooperative, fatigued and no distress Head: Normocephalic, without obvious abnormality, atraumatic Neck: no adenopathy, no JVD, supple, symmetrical, trachea midline and thyroid not enlarged, symmetric, no tenderness/mass/nodules Lymph nodes: Cervical, supraclavicular, and axillary nodes normal. Resp: clear to auscultation bilaterally Back: symmetric, no curvature. ROM normal. No CVA tenderness. Cardio: regular rate and rhythm, S1, S2 normal, no murmur, click, rub or gallop GI: soft, non-tender; bowel sounds normal; no masses,  no organomegaly Extremities: extremities normal, atraumatic, no cyanosis or edema  ECOG PERFORMANCE STATUS: 1 - Symptomatic but completely ambulatory  Blood pressure (!) 163/81, pulse 69, temperature 98.2 F (36.8 C), temperature source Oral, resp. rate 16, weight 167 lb (75.8 kg), SpO2 94 %.  LABORATORY DATA: Lab Results  Component Value Date   WBC 5.9 11/02/2015   HGB 12.3 11/02/2015   HCT 37.8 11/02/2015   MCV 96.2 11/02/2015   PLT 211 11/02/2015      Chemistry      Component Value Date/Time   NA 143 11/02/2015 0904   K 4.4 11/02/2015 0904   CL 100 06/13/2014 0512   CL 94 (L) 04/24/2012 1112   CO2 29 11/02/2015 0904   BUN 19.4 11/02/2015 0904   CREATININE 1.0 11/02/2015 0904      Component Value Date/Time   CALCIUM 9.4 11/02/2015 0904   ALKPHOS 63 11/02/2015 0904   AST 21 11/02/2015 0904   ALT 13 11/02/2015 0904   BILITOT 0.66 11/02/2015 0904       RADIOGRAPHIC STUDIES: No results found.  ASSESSMENT AND PLAN: This is a very pleasant 83 years old white female with history of stage IB non-small cell lung cancer status post right upper lobectomy with lymph node dissection followed by adjuvant systemic chemotherapy  completed in November 2006. The patient has been on observation and no clear evidence for disease progression that her last imaging studies were a year ago. I recommended for the patient to have chest x-ray today. If the chest x-ray showed no concerning findings, she will follow-up with her primary care physician and no need for regular follow-up visit at the [facility] at this point. I'll be happy to see her in the future on as-needed basis. The patient agreed to the current plan. The patient voices understanding of current disease status and treatment options and is in agreement with the current care plan.  All questions were answered. The patient knows to call the clinic with any problems, questions or concerns. We can certainly see the patient much sooner if necessary.  I spent 10 minutes counseling the patient face to face. The total time spent in the appointment was 15 minutes.  Disclaimer: This note was dictated with voice recognition software. Similar sounding words can inadvertently be transcribed and may not be corrected upon review.

## 2016-11-06 ENCOUNTER — Telehealth: Payer: Self-pay | Admitting: Internal Medicine

## 2016-11-06 NOTE — Telephone Encounter (Signed)
No f/u needed per 1/2 los. Patient does have order for cxr to be done 11/05/16, however I do not see that cxr was done. Left message for patient as a reminder for cxr.

## 2016-11-11 ENCOUNTER — Telehealth: Payer: Self-pay | Admitting: Medical Oncology

## 2016-11-11 NOTE — Telephone Encounter (Signed)
Wants appt . I told her the order for cxr is in and she can go anytime.

## 2016-11-13 ENCOUNTER — Ambulatory Visit (HOSPITAL_COMMUNITY)
Admission: RE | Admit: 2016-11-13 | Discharge: 2016-11-13 | Disposition: A | Discharge status: Home / Self Care | Payer: Medicare Other | Source: Ambulatory Visit | Attending: Internal Medicine | Admitting: Internal Medicine

## 2016-11-13 DIAGNOSIS — J984 Other disorders of lung: Secondary | ICD-10-CM | POA: Diagnosis not present

## 2016-11-13 DIAGNOSIS — C3411 Malignant neoplasm of upper lobe, right bronchus or lung: Secondary | ICD-10-CM | POA: Insufficient documentation

## 2016-11-13 DIAGNOSIS — J449 Chronic obstructive pulmonary disease, unspecified: Secondary | ICD-10-CM | POA: Diagnosis not present

## 2016-12-04 ENCOUNTER — Telehealth: Payer: Self-pay | Admitting: Internal Medicine

## 2016-12-04 MED ORDER — MOMETASONE FUROATE 50 MCG/ACT NA SUSP
2.0000 | Freq: Every day | NASAL | 3 refills | Status: AC
Start: 2016-12-04 — End: ?

## 2016-12-04 MED ORDER — ALBUTEROL SULFATE HFA 108 (90 BASE) MCG/ACT IN AERS
INHALATION_SPRAY | RESPIRATORY_TRACT | 3 refills | Status: AC
Start: 2016-12-04 — End: ?

## 2016-12-04 NOTE — Telephone Encounter (Signed)
Spoke with pt, who states her nasonex was sent in to [facility] for a one month supply, instead of three. I have apologized to pt. I have reordered nasonex for a 3 month supply. Pt also requested albuterol refills, I have sent a three month supplies in as well. Pt is aware and voiced her understanding. Nothing further needed.

## 2016-12-15 ENCOUNTER — Encounter (HOSPITAL_COMMUNITY): Payer: Self-pay | Admitting: Emergency Medicine

## 2016-12-15 ENCOUNTER — Emergency Department (HOSPITAL_COMMUNITY)
Admission: EM | Admit: 2016-12-15 | Discharge: 2016-12-15 | Disposition: A | Discharge status: Home / Self Care | Payer: Medicare Other | Attending: Emergency Medicine | Admitting: Emergency Medicine

## 2016-12-15 ENCOUNTER — Emergency Department (HOSPITAL_COMMUNITY): Payer: Medicare Other

## 2016-12-15 DIAGNOSIS — J441 Chronic obstructive pulmonary disease with (acute) exacerbation: Secondary | ICD-10-CM | POA: Diagnosis not present

## 2016-12-15 DIAGNOSIS — Y929 Unspecified place or not applicable: Secondary | ICD-10-CM | POA: Insufficient documentation

## 2016-12-15 DIAGNOSIS — S0990XA Unspecified injury of head, initial encounter: Secondary | ICD-10-CM | POA: Diagnosis not present

## 2016-12-15 DIAGNOSIS — W01119A Fall on same level from slipping, tripping and stumbling with subsequent striking against unspecified sharp object, initial encounter: Secondary | ICD-10-CM | POA: Diagnosis not present

## 2016-12-15 DIAGNOSIS — S76011A Strain of muscle, fascia and tendon of right hip, initial encounter: Secondary | ICD-10-CM | POA: Diagnosis not present

## 2016-12-15 DIAGNOSIS — Z87891 Personal history of nicotine dependence: Secondary | ICD-10-CM | POA: Diagnosis not present

## 2016-12-15 DIAGNOSIS — Z79899 Other long term (current) drug therapy: Secondary | ICD-10-CM | POA: Insufficient documentation

## 2016-12-15 DIAGNOSIS — S79911A Unspecified injury of right hip, initial encounter: Secondary | ICD-10-CM | POA: Diagnosis present

## 2016-12-15 DIAGNOSIS — Z96652 Presence of left artificial knee joint: Secondary | ICD-10-CM | POA: Insufficient documentation

## 2016-12-15 DIAGNOSIS — J449 Chronic obstructive pulmonary disease, unspecified: Secondary | ICD-10-CM | POA: Insufficient documentation

## 2016-12-15 DIAGNOSIS — Y999 Unspecified external cause status: Secondary | ICD-10-CM | POA: Insufficient documentation

## 2016-12-15 DIAGNOSIS — Y939 Activity, unspecified: Secondary | ICD-10-CM | POA: Diagnosis not present

## 2016-12-15 DIAGNOSIS — S0003XA Contusion of scalp, initial encounter: Secondary | ICD-10-CM | POA: Insufficient documentation

## 2016-12-15 DIAGNOSIS — Z96641 Presence of right artificial hip joint: Secondary | ICD-10-CM | POA: Diagnosis not present

## 2016-12-15 DIAGNOSIS — S064X0A Epidural hemorrhage without loss of consciousness, initial encounter: Secondary | ICD-10-CM | POA: Diagnosis not present

## 2016-12-15 DIAGNOSIS — Z85118 Personal history of other malignant neoplasm of bronchus and lung: Secondary | ICD-10-CM | POA: Diagnosis not present

## 2016-12-15 DIAGNOSIS — T148XXA Other injury of unspecified body region, initial encounter: Secondary | ICD-10-CM

## 2016-12-15 DIAGNOSIS — M25551 Pain in right hip: Secondary | ICD-10-CM | POA: Diagnosis not present

## 2016-12-15 DIAGNOSIS — M25559 Pain in unspecified hip: Secondary | ICD-10-CM | POA: Diagnosis not present

## 2016-12-15 MED ORDER — ACETAMINOPHEN 500 MG PO TABS
1000.0000 mg | ORAL_TABLET | Freq: Once | ORAL | Status: AC
  Administered 2016-12-15: 1000 mg via ORAL
  Filled 2016-12-15: qty 2

## 2016-12-15 NOTE — ED Triage Notes (Signed)
Per EMS pt fell backwards and hit head has hematoma, denies LOC. Pt has h/s of chronic back pain. C/o R hip pain and able to bear weight. Pt is not on blood thinners

## 2016-12-15 NOTE — ED Notes (Signed)
Pt able to bear weight and take steps with walker

## 2016-12-15 NOTE — ED Notes (Signed)
Pt stable, ambulatory, states understanding of discharge instructions, PTAR to transport

## 2016-12-15 NOTE — ED Provider Notes (Signed)
[facility] DEPT Provider Note   CSN: [id number] Arrival date & time: 12/15/16  7408     History   Chief Complaint Chief Complaint  Patient presents with  . Fall    HPI Indiah JAILANI HOGANS is a 83 y.o. female.  The history is provided by the patient.  Hip Pain  This is a new problem. The current episode started 1 to 2 hours ago. The problem occurs constantly. The problem has not changed since onset.Pertinent negatives include no chest pain and no abdominal pain. Nothing aggravates the symptoms. Nothing relieves the symptoms. She has tried nothing for the symptoms.    Past Medical History:  Diagnosis Date  . Allergic rhinitis, cause unspecified   . Angioneurotic edema not elsewhere classified   . Arthritis   . Cancer ([place]) 2006   lung ca  . Chronic obstructive asthma, unspecified   . Collapsed lung 1959   hx of with chest tube insertion  . Complication of anesthesia    severe headache x1  . Depression   . Environmental allergies   . Headache(784.0)   . Malignant neoplasm of bronchus and lung, unspecified site   . UTI (urinary tract infection)     Patient Active Problem List   Diagnosis Date Noted  . Hip fracture ([place]) 06/10/2014  . Closed stable fracture of multiple pubic rami ([place]) 05/09/2014  . Unspecified constipation 04/29/2014  . S/P total knee arthroplasty 04/10/2014  . Postoperative anemia due to acute blood loss 03/22/2014  . Hyponatremia 03/22/2014  . OA (osteoarthritis) of knee 03/21/2014  . CHRONIC ETHMOIDAL SINUSITIS 01/15/2011  . Allergic rhinitis due to pollen 12/31/2010  . NECK PAIN 11/13/2010  . Cancer of upper lobe of right lung ([place]) 08/19/2007  . COPD mixed type ([place]) 08/19/2007  . ANGIOEDEMA 08/19/2007    Past Surgical History:  Procedure Laterality Date  . ABDOMINAL HYSTERECTOMY    . CHOLECYSTECTOMY  1969  . CLOSED MANIPULATION SHOULDER Right 1990's   "frozen shoulder"  . HIP ARTHROPLASTY Right 06/11/2014   Procedure: RIGHT HIP HEMI  ARTHROPLASTY;  Surgeon: Gearlean Alf, MD;  Location: [facility];  Service: Orthopedics;  Laterality: Right;  . right upper lobectomy  2006  . TOTAL KNEE ARTHROPLASTY Left 03/21/2014   Procedure: LEFT TOTAL KNEE ARTHROPLASTY;  Surgeon: Gearlean Alf, MD;  Location: [facility];  Service: Orthopedics;  Laterality: Left;    OB History    No data available       Home Medications    Prior to Admission medications   Medication Sig Start Date End Date Taking? Authorizing Provider  acetaminophen (TYLENOL) 500 MG tablet Take 500 mg by mouth every 6 (six) hours as needed for mild pain.    Yes Historical Provider, MD  albuterol (PROVENTIL HFA) 108 (90 Base) MCG/ACT inhaler USE 1 TO 2 INHALATIONS     ORALLY EVERY 6 HOURS AS    NEEDED FOR WHEEZING OR     SHORTNESS OF BREATH 12/04/16  Yes Deneise Lever, MD  amLODipine (NORVASC) 2.5 MG tablet Take 2.5 mg by mouth daily.  04/04/15  Yes Historical Provider, MD  CALCIUM-VITAMIN D PO Take 1 capsule by mouth daily.    Yes Historical Provider, MD  diazepam (VALIUM) 5 MG tablet Take 2.5 mg by mouth at bedtime as needed for anxiety.   Yes Historical Provider, MD  diclofenac sodium (VOLTAREN) 1 % GEL Apply 1 application topically 3 (three) times daily as needed (pain).    Yes Historical Provider, MD  Fluticasone-Salmeterol (ADVAIR DISKUS) 250-50 MCG/DOSE AEPB USE 1 INHALATION ORALLY    INTO THE LUNGS TWO TIMES A DAY 07/29/16  Yes Deneise Lever, MD  levalbuterol (XOPENEX) 0.63 MG/3ML nebulizer solution Take 3 mLs (0.63 mg total) by nebulization every 6 (six) hours as needed for wheezing or shortness of breath. 06/14/14  Yes Delfina Redwood, MD  metoprolol (TOPROL-XL) 50 MG 24 hr tablet Take 50 mg by mouth every morning.    Yes Historical Provider, MD  metroNIDAZOLE (METROGEL) 1 % gel Apply 1 application topically at bedtime as needed.   Yes Historical Provider, MD  mometasone (NASONEX) 50 MCG/ACT nasal spray Place 2 sprays into the nose daily. 12/04/16  Yes Deneise Lever, MD  Omega-3 Fatty Acids (FISH OIL) 1200 MG CAPS Take 1 capsule by mouth daily.   Yes Historical Provider, MD  Vitamin D, Ergocalciferol, (DRISDOL) 50000 UNITS CAPS capsule Take 50,000 Units by mouth every 30 (thirty) days.   Yes Historical Provider, MD    Family History Family History  Problem Relation Age of Onset  . Asthma    . Alzheimer's disease Mother   . Stroke Mother     Social History Social History  Substance Use Topics  . Smoking status: Former Smoker    Packs/day: 1.25    Years: 50.00    Types: Cigarettes    Quit date: 11/04/1993  . Smokeless tobacco: Never Used  . Alcohol use No     Allergies   Aspirin; Codeine sulfate; Penicillins; and Septra ds [sulfamethoxazole-trimethoprim]   Review of Systems Review of Systems  Cardiovascular: Negative for chest pain.  Gastrointestinal: Negative for abdominal pain.  All other systems reviewed and are negative.    Physical Exam Updated Vital Signs BP 144/98   Pulse 66   Temp 98.1 F (36.7 C) (Oral)   Resp 17   SpO2 95%   Physical Exam  Constitutional: She is oriented to person, place, and time. She appears well-developed and well-nourished. No distress.  HENT:  Head: Normocephalic. Head is with contusion (with overlying 3 cm hematoma).    Nose: Nose normal.  Eyes: Conjunctivae are normal.  Neck: Neck supple. No tracheal deviation present.  Cardiovascular: Normal rate and regular rhythm.   Pulmonary/Chest: Effort normal. No respiratory distress.  Abdominal: Soft. She exhibits no distension.  Musculoskeletal:       Right hip: She exhibits tenderness. She exhibits normal range of motion, normal strength, no swelling and no deformity.  Neurological: She is alert and oriented to person, place, and time.  Skin: Skin is warm and dry.  Psychiatric: She has a normal mood and affect.     ED Treatments / Results  Labs (all labs ordered are listed, but only abnormal results are displayed) Labs Reviewed -  No data to display  EKG  EKG Interpretation None       Radiology Dg Hip Unilat W Or Wo Pelvis 2-3 Views Right  Result Date: 12/15/2016 CLINICAL DATA:  Right hip pain.  Unable to bear weight. EXAM: DG HIP (WITH OR WITHOUT PELVIS) 2-3V RIGHT COMPARISON:  None. FINDINGS: Surgical hardware projects over the lower lumbar spine. The patient is status post right hip replacement. The acetabular component is in good position. The femoral component is in good position with no evidence of loosening or infection. No fracture or dislocation. IMPRESSION: No acute abnormality. Electronically Signed   By: Dorise Bullion III M.D   On: 12/15/2016 20:41    Procedures Procedures (including critical care time)  Medications Ordered in ED Medications  acetaminophen (TYLENOL) tablet 1,000 mg (1,000 mg Oral Given 12/15/16 1953)     Initial Impression / Assessment and Plan / ED Course  I have reviewed the triage vital signs and the nursing notes.  Pertinent labs & imaging results that were available during my care of the patient were reviewed by me and considered in my medical decision making (see chart for details).     83 y.o. female presents with fall from standing after tripping. Posterior right hip pain and cramping of hamstring and quad muscles. Also sustained a hematoma to the top of her head. Not anticoagulated, no LOC, no N/V, no neuro deficits. Low suspicion for ICH, discussed expectant management and return precautions for imaging indications.   Final Clinical Impressions(s) / ED Diagnoses   Final diagnoses:  Muscle strain  Hematoma of scalp, initial encounter    New Prescriptions Discharge Medication List as of 12/15/2016 10:36 PM       Leo Grosser, MD 12/16/16 0131

## 2016-12-16 ENCOUNTER — Telehealth: Payer: Self-pay | Admitting: *Deleted

## 2016-12-16 NOTE — Telephone Encounter (Signed)
EDCM noted CM consult.  Reviewed chart and basis for consult are unclear.  Will follow-up with EDP.

## 2017-01-14 DIAGNOSIS — M25551 Pain in right hip: Secondary | ICD-10-CM | POA: Diagnosis not present

## 2017-02-10 DIAGNOSIS — H0012 Chalazion right lower eyelid: Secondary | ICD-10-CM | POA: Diagnosis not present

## 2017-02-24 DIAGNOSIS — H0012 Chalazion right lower eyelid: Secondary | ICD-10-CM | POA: Diagnosis not present

## 2017-03-01 ENCOUNTER — Other Ambulatory Visit: Payer: Self-pay | Admitting: Internal Medicine

## 2017-04-23 DIAGNOSIS — M48061 Spinal stenosis, lumbar region without neurogenic claudication: Secondary | ICD-10-CM | POA: Diagnosis not present

## 2017-04-23 DIAGNOSIS — F419 Anxiety disorder, unspecified: Secondary | ICD-10-CM | POA: Diagnosis not present

## 2017-04-23 DIAGNOSIS — F325 Major depressive disorder, single episode, in full remission: Secondary | ICD-10-CM | POA: Diagnosis not present

## 2017-04-23 DIAGNOSIS — E7800 Pure hypercholesterolemia, unspecified: Secondary | ICD-10-CM | POA: Diagnosis not present

## 2017-04-23 DIAGNOSIS — C3491 Malignant neoplasm of unspecified part of right bronchus or lung: Secondary | ICD-10-CM | POA: Diagnosis not present

## 2017-04-23 DIAGNOSIS — N183 Chronic kidney disease, stage 3 (moderate): Secondary | ICD-10-CM | POA: Diagnosis not present

## 2017-04-23 DIAGNOSIS — J449 Chronic obstructive pulmonary disease, unspecified: Secondary | ICD-10-CM | POA: Diagnosis not present

## 2017-04-23 DIAGNOSIS — J309 Allergic rhinitis, unspecified: Secondary | ICD-10-CM | POA: Diagnosis not present

## 2017-04-23 DIAGNOSIS — I10 Essential (primary) hypertension: Secondary | ICD-10-CM | POA: Diagnosis not present

## 2017-08-25 DIAGNOSIS — R112 Nausea with vomiting, unspecified: Secondary | ICD-10-CM | POA: Diagnosis not present

## 2017-08-25 DIAGNOSIS — L719 Rosacea, unspecified: Secondary | ICD-10-CM | POA: Diagnosis not present

## 2017-08-25 DIAGNOSIS — K529 Noninfective gastroenteritis and colitis, unspecified: Secondary | ICD-10-CM | POA: Diagnosis not present

## 2017-08-28 ENCOUNTER — Ambulatory Visit: Payer: Medicare Other | Admitting: Internal Medicine

## 2017-09-02 ENCOUNTER — Emergency Department (HOSPITAL_COMMUNITY): Payer: Medicare Other

## 2017-09-02 ENCOUNTER — Emergency Department (HOSPITAL_COMMUNITY)
Admission: EM | Admit: 2017-09-02 | Discharge: 2017-09-02 | Disposition: A | Discharge status: Home / Self Care | Payer: Medicare Other | Attending: Emergency Medicine | Admitting: Emergency Medicine

## 2017-09-02 ENCOUNTER — Encounter (HOSPITAL_COMMUNITY): Payer: Self-pay | Admitting: Emergency Medicine

## 2017-09-02 DIAGNOSIS — Y929 Unspecified place or not applicable: Secondary | ICD-10-CM | POA: Insufficient documentation

## 2017-09-02 DIAGNOSIS — H02889 Meibomian gland dysfunction of unspecified eye, unspecified eyelid: Secondary | ICD-10-CM | POA: Diagnosis not present

## 2017-09-02 DIAGNOSIS — W01198A Fall on same level from slipping, tripping and stumbling with subsequent striking against other object, initial encounter: Secondary | ICD-10-CM | POA: Diagnosis not present

## 2017-09-02 DIAGNOSIS — S279XXA Injury of unspecified intrathoracic organ, initial encounter: Secondary | ICD-10-CM | POA: Diagnosis not present

## 2017-09-02 DIAGNOSIS — Y9389 Activity, other specified: Secondary | ICD-10-CM | POA: Insufficient documentation

## 2017-09-02 DIAGNOSIS — Z79899 Other long term (current) drug therapy: Secondary | ICD-10-CM | POA: Diagnosis not present

## 2017-09-02 DIAGNOSIS — Z87891 Personal history of nicotine dependence: Secondary | ICD-10-CM | POA: Insufficient documentation

## 2017-09-02 DIAGNOSIS — J45909 Unspecified asthma, uncomplicated: Secondary | ICD-10-CM | POA: Insufficient documentation

## 2017-09-02 DIAGNOSIS — S20211A Contusion of right front wall of thorax, initial encounter: Secondary | ICD-10-CM | POA: Insufficient documentation

## 2017-09-02 DIAGNOSIS — L718 Other rosacea: Secondary | ICD-10-CM | POA: Diagnosis not present

## 2017-09-02 DIAGNOSIS — W19XXXA Unspecified fall, initial encounter: Secondary | ICD-10-CM

## 2017-09-02 DIAGNOSIS — Y999 Unspecified external cause status: Secondary | ICD-10-CM | POA: Diagnosis not present

## 2017-09-02 DIAGNOSIS — R0781 Pleurodynia: Secondary | ICD-10-CM | POA: Diagnosis not present

## 2017-09-02 DIAGNOSIS — Z961 Presence of intraocular lens: Secondary | ICD-10-CM | POA: Diagnosis not present

## 2017-09-02 DIAGNOSIS — S299XXA Unspecified injury of thorax, initial encounter: Secondary | ICD-10-CM | POA: Diagnosis not present

## 2017-09-02 NOTE — ED Triage Notes (Signed)
Per EMS, patient from home, c/o trip and fall outside of house today. C/o right rib pain radiating to the back. Denies head injury and LOC. Denies blood thinners. Ambulatory with cane.   BP 144/90 O2 96% on RA HR 68 RR 18 CBG 81

## 2017-09-02 NOTE — Discharge Instructions (Signed)
Continue use of Tylenol and tramadol as needed for pain control.  Use an incentive spirometer once per hour while awake to ensure that you are taking deep breaths.  Follow-up with your primary care doctor to ensure resolution of symptoms.

## 2017-09-02 NOTE — ED Provider Notes (Signed)
[facility] DEPT Provider Note   CSN: [id number] Arrival date & time: 09/02/17  1715     History   Chief Complaint Chief Complaint  Patient presents with  . Fall    HPI [name] [name] is a 83 y.o. female.  83 year old female with a history of lung cancer and arthritis presents to the emergency department for evaluation after a fall.  She states that she was walking up her concrete steps into her house today when her right foot did not landed completely on the step.  She states that she lost her footing falling on her right side.  She was able to grab the railing of her steps which eased much of her fall.  She denies any head trauma or loss of consciousness.  She is not on chronic anticoagulation.  She took 2 Tylenol for symptoms at home with little relief.  She has worsening pain with inspiration.      Past Medical History:  Diagnosis Date  . Allergic rhinitis, cause unspecified   . Angioneurotic edema not elsewhere classified   . Arthritis   . Cancer ([place]) 2006   lung ca  . Chronic obstructive asthma, unspecified   . Collapsed lung 1959   hx of with chest tube insertion  . Complication of anesthesia    severe headache x1  . Depression   . Environmental allergies   . Headache(784.0)   . Malignant neoplasm of bronchus and lung, unspecified site   . UTI (urinary tract infection)     Patient Active Problem List   Diagnosis Date Noted  . Hip fracture ([place]) 06/10/2014  . Closed stable fracture of multiple pubic rami ([place]) 05/09/2014  . Unspecified constipation 04/29/2014  . S/P total knee arthroplasty 04/10/2014  . Postoperative anemia due to acute blood loss 03/22/2014  . Hyponatremia 03/22/2014  . OA (osteoarthritis) of knee 03/21/2014  . CHRONIC ETHMOIDAL SINUSITIS 01/15/2011  . Allergic rhinitis due to pollen 12/31/2010  . NECK PAIN 11/13/2010  . Cancer of upper lobe of right lung ([place]) 08/19/2007  . COPD mixed type ([place])  08/19/2007  . ANGIOEDEMA 08/19/2007    Past Surgical History:  Procedure Laterality Date  . ABDOMINAL HYSTERECTOMY    . CHOLECYSTECTOMY  1969  . CLOSED MANIPULATION SHOULDER Right 1990's   "frozen shoulder"  . HIP ARTHROPLASTY Right 06/11/2014   Procedure: RIGHT HIP HEMI ARTHROPLASTY;  Surgeon: Gearlean Alf, MD;  Location: [facility] ORS;  Service: Orthopedics;  Laterality: Right;  . right upper lobectomy  2006  . TOTAL KNEE ARTHROPLASTY Left 03/21/2014   Procedure: LEFT TOTAL KNEE ARTHROPLASTY;  Surgeon: Gearlean Alf, MD;  Location: [facility];  Service: Orthopedics;  Laterality: Left;    OB History    No data available       Home Medications    Prior to Admission medications   Medication Sig Start Date End Date Taking? Authorizing Provider  acetaminophen (TYLENOL) 500 MG tablet Take 500 mg by mouth every 6 (six) hours as needed for mild pain.     [provider]  albuterol (PROVENTIL HFA) 108 (90 Base) MCG/ACT inhaler USE 1 TO 2 INHALATIONS     ORALLY EVERY 6 HOURS AS    NEEDED FOR WHEEZING OR     SHORTNESS OF BREATH 12/04/16   Young, Clinton D, MD  amLODipine (NORVASC) 2.5 MG tablet Take 2.5 mg by mouth daily.  04/04/15   [provider]  CALCIUM-VITAMIN D PO Take 1 capsule by mouth  daily.     [provider]  diazepam (VALIUM) 5 MG tablet Take 2.5 mg by mouth at bedtime as needed for anxiety.    [provider]  diclofenac sodium (VOLTAREN) 1 % GEL Apply 1 application topically 3 (three) times daily as needed (pain).     [provider]  Fluticasone-Salmeterol (ADVAIR DISKUS) 250-50 MCG/DOSE AEPB USE 1 INHALATION ORALLY    INTO THE LUNGS TWO TIMES A DAY 07/29/16   Young, Tarri Fuller D, MD  levalbuterol (XOPENEX) 0.63 MG/3ML nebulizer solution Take 3 mLs (0.63 mg total) by nebulization every 6 (six) hours as needed for wheezing or shortness of breath. 06/14/14   Delfina Redwood, MD  metoprolol (TOPROL-XL) 50 MG 24 hr tablet Take 50 mg by mouth  every morning.     [provider]  metroNIDAZOLE (METROGEL) 1 % gel Apply 1 application topically at bedtime as needed.    [provider]  mometasone (NASONEX) 50 MCG/ACT nasal spray Place 2 sprays into the nose daily. 12/04/16   Deneise Lever, MD  Omega-3 Fatty Acids (FISH OIL) 1200 MG CAPS Take 1 capsule by mouth daily.    [provider]  PROVENTIL HFA 108 (90 Base) MCG/ACT inhaler USE 1 TO 2 INHALATIONS     ORALLY EVERY 6 HOURS AS    NEEDED FOR WHEEZING OR     SHORTNESS OF BREATH 03/03/17   Young, Tarri Fuller D, MD  Vitamin D, Ergocalciferol, (DRISDOL) 50000 UNITS CAPS capsule Take 50,000 Units by mouth every 30 (thirty) days.    [provider]    Family History Family History  Problem Relation Age of Onset  . Asthma Unknown   . Alzheimer's disease Mother   . Stroke Mother     Social History Social History  Substance Use Topics  . Smoking status: Former Smoker    Packs/day: 1.25    Years: 50.00    Types: Cigarettes    Quit date: 11/04/1993  . Smokeless tobacco: Never Used  . Alcohol use No     Allergies   Aspirin; Codeine sulfate; Penicillins; and Septra ds [sulfamethoxazole-trimethoprim]   Review of Systems Review of Systems Ten systems reviewed and are negative for acute change, except as noted in the HPI.    Physical Exam Updated Vital Signs BP (!) 156/72   Pulse 63   Temp 97.7 F (36.5 C) (Oral)   Resp 18   SpO2 96%   Physical Exam  Constitutional: She is oriented to person, place, and time. She appears well-developed and well-nourished. No distress.  Nontoxic appearing and in no acute distress  HENT:  Head: Normocephalic and atraumatic.  Eyes: Conjunctivae and EOM are normal. No scleral icterus.  Neck: Normal range of motion.  Cardiovascular: Normal rate, regular rhythm and intact distal pulses.   Pulmonary/Chest: Effort normal. No respiratory distress.  Lungs clear to auscultation bilaterally.  Mild chest tenderness  to the right chest wall under the right breast. No crepitus. Chest expansion symmetric.  Musculoskeletal: Normal range of motion.  Neurological: She is alert and oriented to person, place, and time. She exhibits normal muscle tone. Coordination normal.  GCS 15. Patient moving all extremities.  Skin: Skin is warm and dry. No rash noted. She is not diaphoretic. No erythema. No pallor.  Psychiatric: She has a normal mood and affect. Her behavior is normal.  Nursing note and vitals reviewed.    ED Treatments / Results  Labs (all labs ordered are listed, but only abnormal results are  displayed) Labs Reviewed - No data to display  EKG  EKG Interpretation None       Radiology Dg Ribs Unilateral W/chest Right  Result Date: 09/02/2017 CLINICAL DATA:  83 y/o F; fall down stairs with right lower anterior rib pain. EXAM: RIGHT RIBS AND CHEST - 3+ VIEW COMPARISON:  None. FINDINGS: Chronic fracture right posterior sixth rib. Postsurgical changes in the right mid lung zone. Normal cardiac silhouette and aortic atherosclerosis. No focal consolidation of the lungs. No pneumothorax or pleural effusion. No acute fracture or dislocation identified. Partially visualized is lumbar fusion hardware. IMPRESSION: No acute fracture identified.  No pneumothorax. Electronically Signed   By: Kristine Garbe M.D.   On: 09/02/2017 22:56    Procedures Procedures (including critical care time)  Medications Ordered in ED Medications - No data to display   Initial Impression / Assessment and Plan / ED Course  I have reviewed the triage vital signs and the nursing notes.  Pertinent labs & imaging results that were available during my care of the patient were reviewed by me and considered in my medical decision making (see chart for details).     83 year old female presents to the emergency department after a fall at home complaining of right chest discomfort which is worse with inspiration.  She has  reproducible pain on palpation without bony deformity or crepitus.  Chest expansion symmetric.  X-ray negative for rib fracture or pneumothorax.  Suspect chest wall contusion.  Will manage supportively.  Patient has Tylenol and tramadol at home for management.  She has been provided an incentive spirometer for use at least once per hour. Return precautions discussed and provided. Patient discharged in stable condition with no unaddressed concerns.   Final Clinical Impressions(s) / ED Diagnoses   Final diagnoses:  Fall, initial encounter  Contusion of right chest wall, initial encounter    New Prescriptions New Prescriptions   No medications on file     Antonietta Breach, Hershal Coria 09/02/17 2334    Sherwood Gambler, MD 09/03/17 0040

## 2017-09-16 ENCOUNTER — Other Ambulatory Visit: Payer: Self-pay | Admitting: Internal Medicine

## 2017-09-16 MED ORDER — FLUTICASONE-SALMETEROL 250-50 MCG/DOSE IN AEPB: INHALATION_SPRAY | RESPIRATORY_TRACT | 3 refills | Status: DC

## 2017-09-17 DIAGNOSIS — M549 Dorsalgia, unspecified: Secondary | ICD-10-CM | POA: Diagnosis not present

## 2017-09-17 DIAGNOSIS — Z23 Encounter for immunization: Secondary | ICD-10-CM | POA: Diagnosis not present

## 2017-09-17 DIAGNOSIS — L719 Rosacea, unspecified: Secondary | ICD-10-CM | POA: Diagnosis not present

## 2017-10-08 IMAGING — MR MR LUMBAR SPINE W/O CM
5 series · 45 of 48 positions shown · non-contrast
Comparison: Chest abdomen and pelvis CT 03/31/2008. Barium enema
12/15/2008.

CLINICAL DATA: 88-year-old female with lumbar back pain radiating
to both lower extremities. Symptoms increase with walking. Remote
surgery in 4114. Subsequent encounter.

EXAM:
MRI LUMBAR SPINE WITHOUT CONTRAST
TECHNIQUE: Multiplanar, multisequence MR imaging of the lumbar spine was
performed. No intravenous contrast was administered.

[Series 3: T1 · sagittal · 4.0mm · 0.88mm/px · 6 of 12 slices shown (1 of 2)]
[im 1/12]
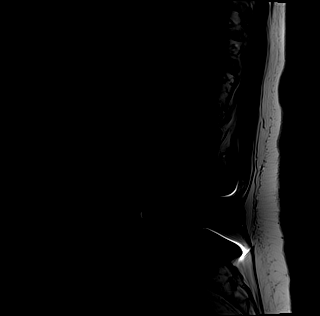
[im 3/12]
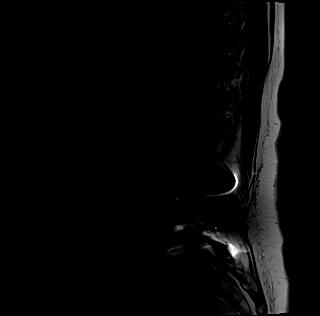
[im 5/12]
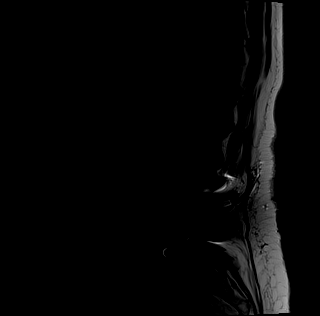
[im 7/12]
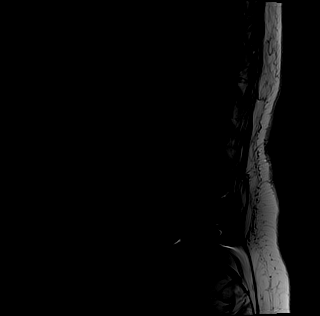
[im 9/12]
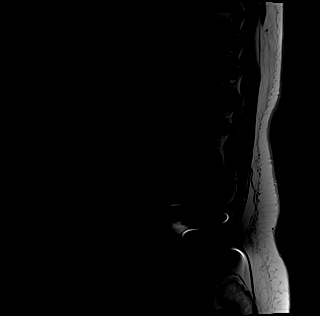
[im 12/12]
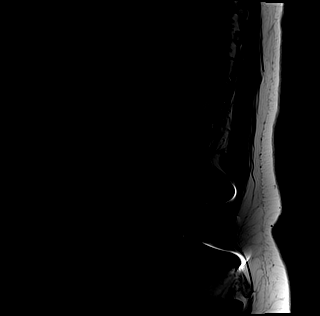

[Series 4: T2 · sagittal · 4.0mm · 0.88mm/px · 5 of 12 slices shown (1 of 2)]
[im 1/12]
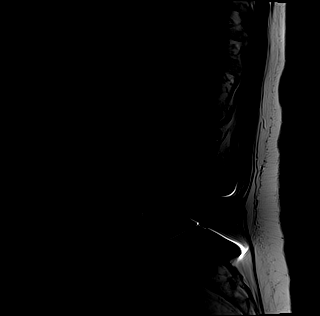
[im 3/12]
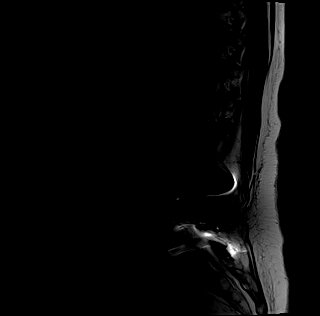
[im 6/12]
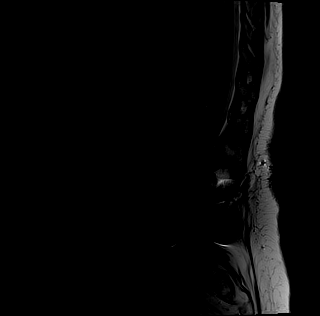
[im 9/12]
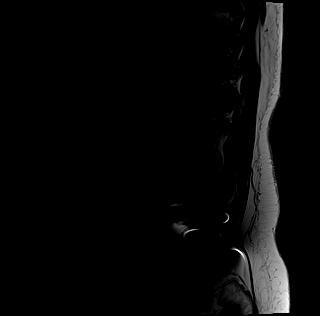
[im 12/12]
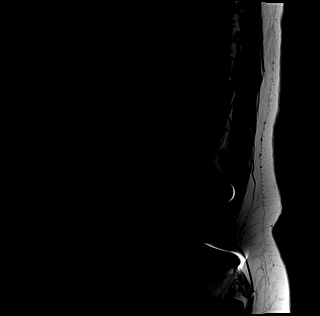

[Series 5: tirm sag · sagittal · 4.0mm · 0.55mm/px · 5 of 12 slices shown]
[im 1/12]
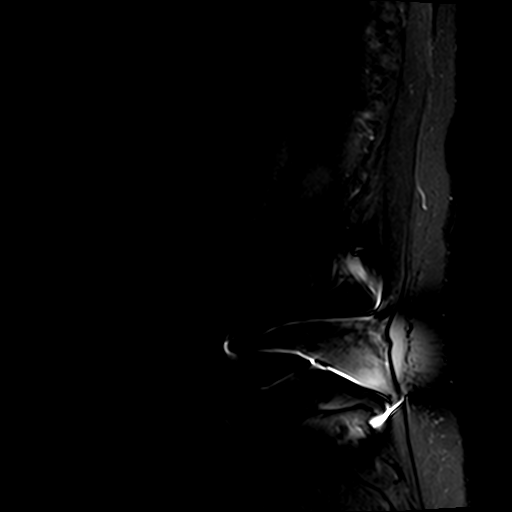
[im 3/12]
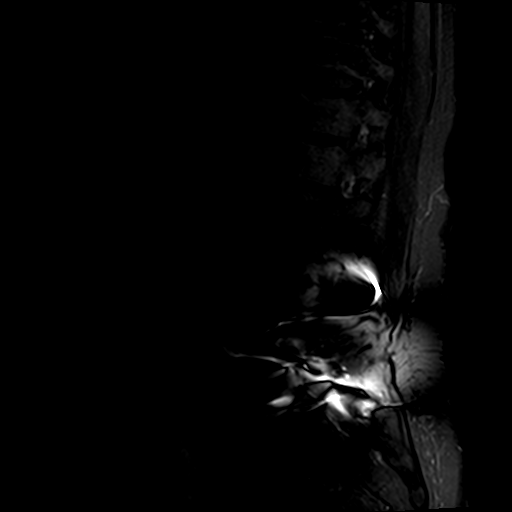
[im 6/12]
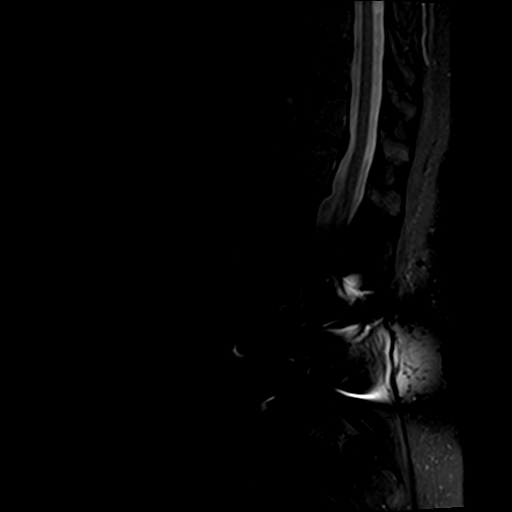
[im 9/12]
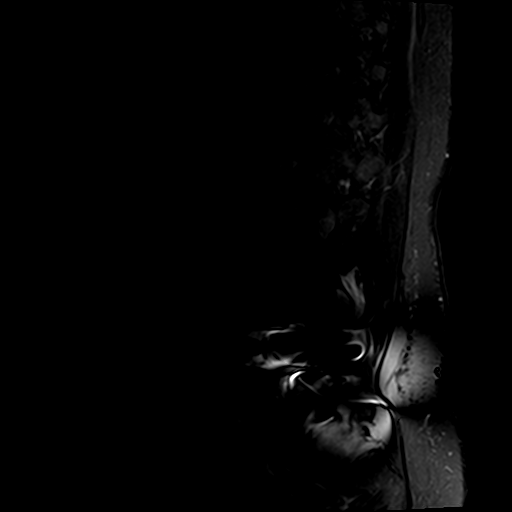
[im 12/12]
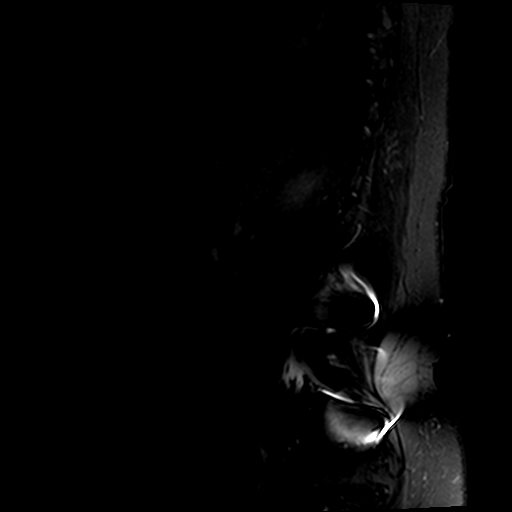

[Series 6: T2 · axial · 4.0mm · 0.70mm/px · z∈[-12,+183]mm · 16 of 35 slices shown (2 of 2)]
[im 1/35]
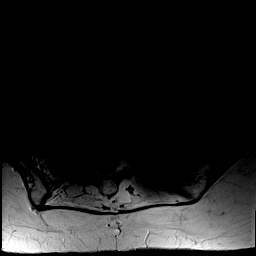
[im 3/35]
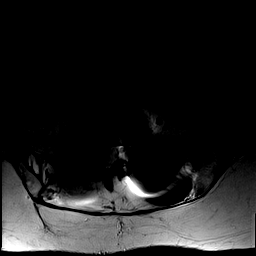
[im 5/35]
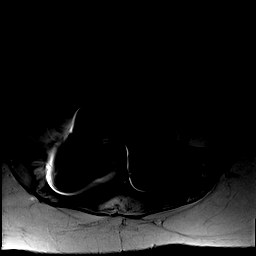
[im 7/35]
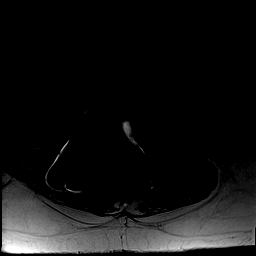
[im 10/35]
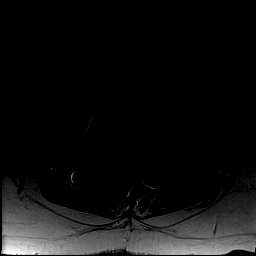
[im 12/35]
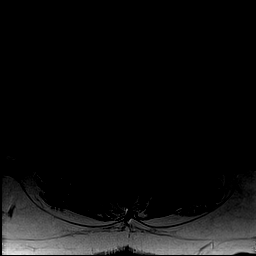
[im 14/35]
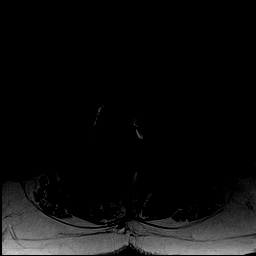
[im 16/35]
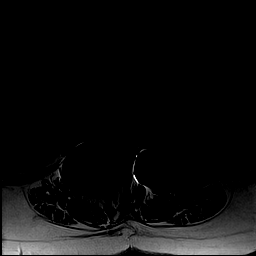
[im 19/35]
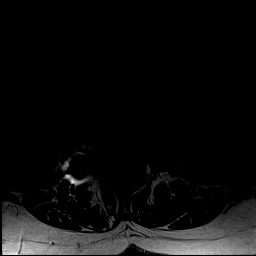
[im 21/35]
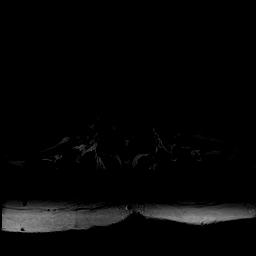
[im 23/35]
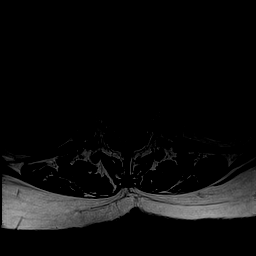
[im 25/35]
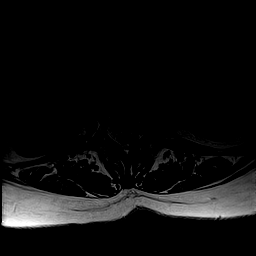
[im 28/35]
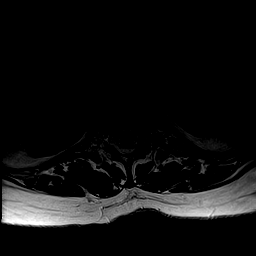
[im 30/35]
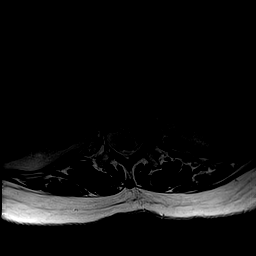
[im 32/35]
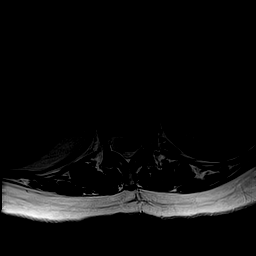
[im 35/35]
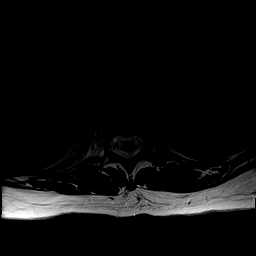

[Series 7: T1 · axial · 4.0mm · 0.70mm/px · z∈[-12,+183]mm · 13 of 35 slices shown (2 of 2)]
[im 1/35]
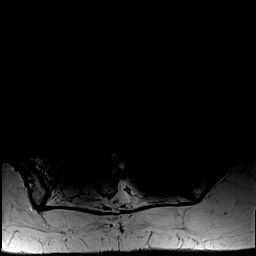
[im 3/35]
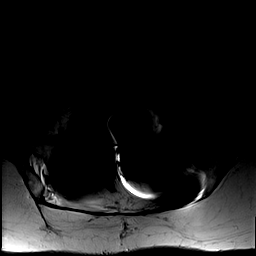
[im 5/35]
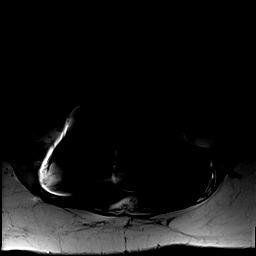
[im 7/35]
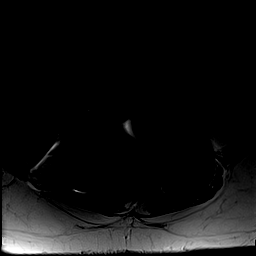
[im 10/35]
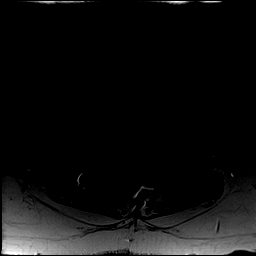
[im 12/35]
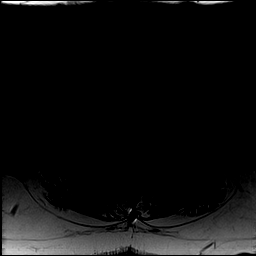
[im 14/35]
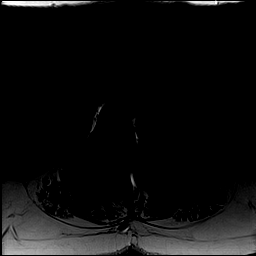
[im 16/35]
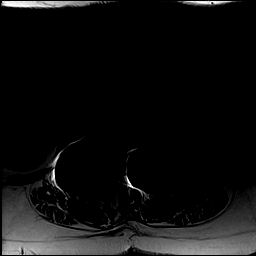
[im 19/35]
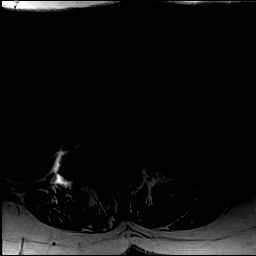
[im 21/35]
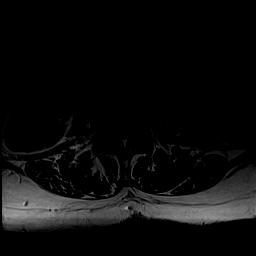
[im 25/35]
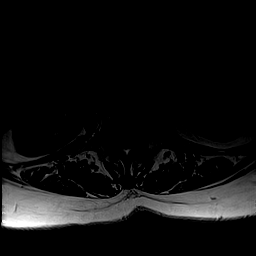
[im 30/35]
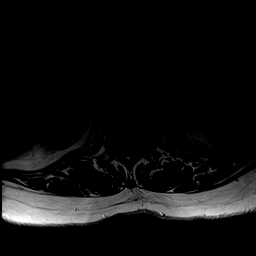
[im 35/35]
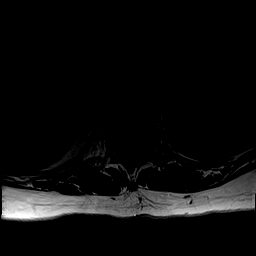

[45 of 48 positions shown; findings below may reference images not displayed]

FINDINGS: Very severe lower lumbar fusion metallic susceptibility artifact
related to the chronic L4-L5 hardware, such that the L3-L4 through
L5-S1 levels are almost completely obscured. Chronic levoconvex
lumbar scoliosis. Grossly stable vertebral height and alignment
compared to the 5449 CT.

Visualized lower thoracic spinal cord is normal with conus medularis
at T12-L1.

Stable visualized abdominal viscera. Partially visible postoperative
changes to the medial right iliac bone.

T9-T10:  Negative.

T10-T11:  Negative.

T11-T12: Chronic central disc protrusion seen to be mostly calcified
on the 5449 comparison. No stenosis.

T12-L1: Mild retrolisthesis, stable since 5449. Mild left eccentric
disc bulge. Mild left T12 foraminal stenosis.

L1-L2: Mild disc bulge. Mild to moderate facet and ligament flavum
hypertrophy. No stenosis.

L2-L3: Circumferential disc bulge with superimposed broad-based
posterior component of disc which extends 2 both neural foramina.
Mild bilateral L2 foraminal foraminal stenosis, probably stable
since 5449.

L3-L4:  Obscured by metallic hardware susceptibility artifact.

L4-L5: Obscured by susceptibility artifact. Previous fusion with
solid interbody arthrodesis demonstrated in 5449.

L5-S1: Largely obscured by susceptibility artifact. However, only
mild if any disc bulge is suspected an the thecal sac and neural
foramen appear adequately patent.
IMPRESSION: 1. Significant metal artifact in the lower lumbar spine related to
the chronic L4-L5 fusion hardware. Solid interbody arthrodesis
demonstrated at L4-L5 by CT in [DATE]. L2-L3 disc degeneration with no definite spinal stenosis and
chronic appearing mild L2 foraminal stenosis.
3. Small chronic calcified disc protrusion at T11-T12 without
stenosis.

## 2017-11-19 DIAGNOSIS — Z0000 Encounter for general adult medical examination without abnormal findings: Secondary | ICD-10-CM | POA: Diagnosis not present

## 2017-11-19 DIAGNOSIS — M48061 Spinal stenosis, lumbar region without neurogenic claudication: Secondary | ICD-10-CM | POA: Diagnosis not present

## 2017-11-19 DIAGNOSIS — J449 Chronic obstructive pulmonary disease, unspecified: Secondary | ICD-10-CM | POA: Diagnosis not present

## 2017-11-19 DIAGNOSIS — E7800 Pure hypercholesterolemia, unspecified: Secondary | ICD-10-CM | POA: Diagnosis not present

## 2017-11-19 DIAGNOSIS — R7309 Other abnormal glucose: Secondary | ICD-10-CM | POA: Diagnosis not present

## 2017-11-19 DIAGNOSIS — M1990 Unspecified osteoarthritis, unspecified site: Secondary | ICD-10-CM | POA: Diagnosis not present

## 2017-11-19 DIAGNOSIS — F322 Major depressive disorder, single episode, severe without psychotic features: Secondary | ICD-10-CM | POA: Diagnosis not present

## 2017-11-19 DIAGNOSIS — Z1389 Encounter for screening for other disorder: Secondary | ICD-10-CM | POA: Diagnosis not present

## 2017-11-19 DIAGNOSIS — N183 Chronic kidney disease, stage 3 (moderate): Secondary | ICD-10-CM | POA: Diagnosis not present

## 2017-11-19 DIAGNOSIS — F325 Major depressive disorder, single episode, in full remission: Secondary | ICD-10-CM | POA: Diagnosis not present

## 2017-11-19 DIAGNOSIS — M810 Age-related osteoporosis without current pathological fracture: Secondary | ICD-10-CM | POA: Diagnosis not present

## 2017-11-19 DIAGNOSIS — I10 Essential (primary) hypertension: Secondary | ICD-10-CM | POA: Diagnosis not present

## 2017-11-19 DIAGNOSIS — E782 Mixed hyperlipidemia: Secondary | ICD-10-CM | POA: Diagnosis not present

## 2017-11-19 DIAGNOSIS — C3491 Malignant neoplasm of unspecified part of right bronchus or lung: Secondary | ICD-10-CM | POA: Diagnosis not present

## 2017-11-21 ENCOUNTER — Other Ambulatory Visit: Payer: Self-pay | Admitting: Internal Medicine

## 2017-12-11 ENCOUNTER — Ambulatory Visit (INDEPENDENT_AMBULATORY_CARE_PROVIDER_SITE_OTHER): Payer: Medicare Other | Admitting: Internal Medicine

## 2017-12-11 ENCOUNTER — Encounter: Payer: Self-pay | Admitting: Internal Medicine

## 2017-12-11 DIAGNOSIS — J301 Allergic rhinitis due to pollen: Secondary | ICD-10-CM

## 2017-12-11 DIAGNOSIS — J449 Chronic obstructive pulmonary disease, unspecified: Secondary | ICD-10-CM | POA: Diagnosis not present

## 2017-12-11 NOTE — Patient Instructions (Signed)
We can continue with the same meds  Glad you have been doing well. Please call if we can help.

## 2017-12-11 NOTE — Progress Notes (Signed)
Patient ID: [name] [name], female    DOB: 06/24/1927, 84 y.o.   MRN: [id number]  HPI Female former smoker (100 pack years ) followed for asthma/COPD, allergic rhinitis, chronic sinusitis, remote history lung cancer PFT 2014 reviewed-mild COPD with insignificant response to bronchodilato -------------------------------------------------------------------------------- 08/26/2016-84 year old female former smoker followed for asthma/COPD, allergic rhinitis, chronic sinusitis, remote history lung cancer/right upper lobectomy, lung nodule/oncology follow-up She continues to follow with Dr. Mohammed/Oncology for remote RUL adenoca/right upper lobe resection Follows For: Increased sob with activity -Prod cough (white, thick), sneezing - Symptoms worse since this past summer Very nonspecific about respiratory status this year. Some mild rash has come and gone on her face possibly associated with exposure to a cat-no longer with her. Right eye irritation with follow-up scheduled with opth soon. Uses nebulizer machine/Xopenex, albuterol rescue inhaler about once daily-helps with cough. Admits very sedentary with no routine exertion.  12/11/17- 84 year old female former smoker followed for asthma/COPD, allergic rhinitis, chronic sinusitis, remote history lung cancer/right upper lobectomy, lung nodule/oncology follow-up Proventil HFA, Nasonex, neb xopenex,  Advair 250,  Occasional dry cough and wheeze.  Uses rescue inhaler mostly at night.  Little wheeze or shortness of breath noticed when she is up and about at home in the daytime-not very active.  No acute process. CXR 11/13/16- 1V-  IMPRESSION: 1. Lower lobe scarring. 2. No acute findings.  Review of Systems-see HPI sign = positive Constitutional:   No-   weight loss, night sweats, fevers, chills, fatigue, lassitude. HEENT:   + headaches, no-difficulty swallowing, tooth/dental problems, sore throat,       No-  sneezing, itching, ear ache, nasal  congestion, post nasal drip,  CV:  No-   chest pain, orthopnea, PND, swelling in lower extremities, anasarca, dizziness, palpitations Resp: +   shortness of breath with exertion or at rest.                productive cough,  +non-productive cough,  No-  coughing up of blood.              No-   change in color of mucus.  + wheezing.   Skin: No-   rash or lesions. GI:  No-   heartburn, indigestion, abdominal pain, nausea, vomiting,  GU:   MS:  No-   joint pain or swelling.  .  + back pain. Neuro- weak and dizzy with exertion Psych:  No- change in mood or affect. No depression or anxiety.  No memory loss.  Objective:   Physical Exam General- Alert, Oriented, Affect-appropriate, Distress- nonacute, + Portable oxygen Skin- +stasis change, pretibial, L>R Lymphadenopathy- none Head- atraumatic            Eyes- Gross vision intact, PERRLA, conjunctivae clear secretions            Ears- Hearing, canals normal            Nose- Clear, No-Septal dev, mucus, polyps, erosion, perforation             Throat- Mallampati III , mucosa clear , drainage- none, tonsils- atrophic.  Neck- flexible , trachea midline, no stridor , thyroid nl, carotid no bruit Chest - symmetrical excursion , unlabored           Heart/CV- RRR , no murmur , no gallop  , no rub, nl s1 s2  Pulse is normal and regular.  JVD- none , edema- none, stasis changes- none, varices- none           Lung- +decreased sounds in bases, wheeze- none, cough- none , dullness-none, rub-  none            Chest wall-  Abd-  Br/ Gen/ Rectal- Not done, not indicated Extrem- cyanosis- none, clubbing, none, atrophy- none, strength- nl,                  + Compression hose Neuro- right facial tic

## 2017-12-14 NOTE — Assessment & Plan Note (Signed)
No acute exacerbation.  Medications are appropriate as discussed.

## 2017-12-14 NOTE — Assessment & Plan Note (Signed)
At her age, irritant/vasomotor rhinitis is likely to be a bigger problem than atopic rhinitis.  We discussed use of antihistamines, Flonase.

## 2018-01-12 DIAGNOSIS — M810 Age-related osteoporosis without current pathological fracture: Secondary | ICD-10-CM | POA: Diagnosis not present

## 2018-01-12 DIAGNOSIS — M8588 Other specified disorders of bone density and structure, other site: Secondary | ICD-10-CM | POA: Diagnosis not present

## 2018-01-13 DIAGNOSIS — F325 Major depressive disorder, single episode, in full remission: Secondary | ICD-10-CM | POA: Diagnosis not present

## 2018-01-13 DIAGNOSIS — J449 Chronic obstructive pulmonary disease, unspecified: Secondary | ICD-10-CM | POA: Diagnosis not present

## 2018-01-13 DIAGNOSIS — F322 Major depressive disorder, single episode, severe without psychotic features: Secondary | ICD-10-CM | POA: Diagnosis not present

## 2018-01-13 DIAGNOSIS — M810 Age-related osteoporosis without current pathological fracture: Secondary | ICD-10-CM | POA: Diagnosis not present

## 2018-01-13 DIAGNOSIS — E782 Mixed hyperlipidemia: Secondary | ICD-10-CM | POA: Diagnosis not present

## 2018-01-13 DIAGNOSIS — F329 Major depressive disorder, single episode, unspecified: Secondary | ICD-10-CM | POA: Diagnosis not present

## 2018-01-13 DIAGNOSIS — C3491 Malignant neoplasm of unspecified part of right bronchus or lung: Secondary | ICD-10-CM | POA: Diagnosis not present

## 2018-01-13 DIAGNOSIS — I10 Essential (primary) hypertension: Secondary | ICD-10-CM | POA: Diagnosis not present

## 2018-01-13 DIAGNOSIS — N183 Chronic kidney disease, stage 3 (moderate): Secondary | ICD-10-CM | POA: Diagnosis not present

## 2018-01-13 DIAGNOSIS — M1990 Unspecified osteoarthritis, unspecified site: Secondary | ICD-10-CM | POA: Diagnosis not present

## 2018-02-25 DIAGNOSIS — C3491 Malignant neoplasm of unspecified part of right bronchus or lung: Secondary | ICD-10-CM | POA: Diagnosis not present

## 2018-02-25 DIAGNOSIS — I10 Essential (primary) hypertension: Secondary | ICD-10-CM | POA: Diagnosis not present

## 2018-02-25 DIAGNOSIS — F322 Major depressive disorder, single episode, severe without psychotic features: Secondary | ICD-10-CM | POA: Diagnosis not present

## 2018-02-25 DIAGNOSIS — N183 Chronic kidney disease, stage 3 (moderate): Secondary | ICD-10-CM | POA: Diagnosis not present

## 2018-02-25 DIAGNOSIS — M1990 Unspecified osteoarthritis, unspecified site: Secondary | ICD-10-CM | POA: Diagnosis not present

## 2018-02-25 DIAGNOSIS — M810 Age-related osteoporosis without current pathological fracture: Secondary | ICD-10-CM | POA: Diagnosis not present

## 2018-02-25 DIAGNOSIS — J449 Chronic obstructive pulmonary disease, unspecified: Secondary | ICD-10-CM | POA: Diagnosis not present

## 2018-02-25 DIAGNOSIS — E782 Mixed hyperlipidemia: Secondary | ICD-10-CM | POA: Diagnosis not present

## 2018-03-02 DIAGNOSIS — H02052 Trichiasis without entropian right lower eyelid: Secondary | ICD-10-CM | POA: Diagnosis not present

## 2018-03-20 ENCOUNTER — Other Ambulatory Visit: Payer: Self-pay | Admitting: Internal Medicine

## 2018-03-23 DIAGNOSIS — H02052 Trichiasis without entropian right lower eyelid: Secondary | ICD-10-CM | POA: Diagnosis not present

## 2018-07-07 DIAGNOSIS — C3491 Malignant neoplasm of unspecified part of right bronchus or lung: Secondary | ICD-10-CM | POA: Diagnosis not present

## 2018-07-07 DIAGNOSIS — J449 Chronic obstructive pulmonary disease, unspecified: Secondary | ICD-10-CM | POA: Diagnosis not present

## 2018-07-07 DIAGNOSIS — F325 Major depressive disorder, single episode, in full remission: Secondary | ICD-10-CM | POA: Diagnosis not present

## 2018-07-07 DIAGNOSIS — R269 Unspecified abnormalities of gait and mobility: Secondary | ICD-10-CM | POA: Diagnosis not present

## 2018-07-07 DIAGNOSIS — I10 Essential (primary) hypertension: Secondary | ICD-10-CM | POA: Diagnosis not present

## 2018-07-07 DIAGNOSIS — E7800 Pure hypercholesterolemia, unspecified: Secondary | ICD-10-CM | POA: Diagnosis not present

## 2018-07-07 DIAGNOSIS — N183 Chronic kidney disease, stage 3 (moderate): Secondary | ICD-10-CM | POA: Diagnosis not present

## 2018-07-10 DIAGNOSIS — G4700 Insomnia, unspecified: Secondary | ICD-10-CM | POA: Diagnosis not present

## 2018-07-10 DIAGNOSIS — E7800 Pure hypercholesterolemia, unspecified: Secondary | ICD-10-CM | POA: Diagnosis not present

## 2018-07-10 DIAGNOSIS — M47813 Spondylosis without myelopathy or radiculopathy, cervicothoracic region: Secondary | ICD-10-CM | POA: Diagnosis not present

## 2018-07-10 DIAGNOSIS — I129 Hypertensive chronic kidney disease with stage 1 through stage 4 chronic kidney disease, or unspecified chronic kidney disease: Secondary | ICD-10-CM | POA: Diagnosis not present

## 2018-07-10 DIAGNOSIS — F325 Major depressive disorder, single episode, in full remission: Secondary | ICD-10-CM | POA: Diagnosis not present

## 2018-07-10 DIAGNOSIS — J449 Chronic obstructive pulmonary disease, unspecified: Secondary | ICD-10-CM | POA: Diagnosis not present

## 2018-07-10 DIAGNOSIS — C3491 Malignant neoplasm of unspecified part of right bronchus or lung: Secondary | ICD-10-CM | POA: Diagnosis not present

## 2018-07-10 DIAGNOSIS — N183 Chronic kidney disease, stage 3 (moderate): Secondary | ICD-10-CM | POA: Diagnosis not present

## 2018-07-10 DIAGNOSIS — M4800 Spinal stenosis, site unspecified: Secondary | ICD-10-CM | POA: Diagnosis not present

## 2018-07-10 DIAGNOSIS — M19041 Primary osteoarthritis, right hand: Secondary | ICD-10-CM | POA: Diagnosis not present

## 2018-07-10 DIAGNOSIS — M810 Age-related osteoporosis without current pathological fracture: Secondary | ICD-10-CM | POA: Diagnosis not present

## 2018-07-10 DIAGNOSIS — M19042 Primary osteoarthritis, left hand: Secondary | ICD-10-CM | POA: Diagnosis not present

## 2018-07-13 DIAGNOSIS — I129 Hypertensive chronic kidney disease with stage 1 through stage 4 chronic kidney disease, or unspecified chronic kidney disease: Secondary | ICD-10-CM | POA: Diagnosis not present

## 2018-07-13 DIAGNOSIS — J449 Chronic obstructive pulmonary disease, unspecified: Secondary | ICD-10-CM | POA: Diagnosis not present

## 2018-07-14 DIAGNOSIS — I129 Hypertensive chronic kidney disease with stage 1 through stage 4 chronic kidney disease, or unspecified chronic kidney disease: Secondary | ICD-10-CM | POA: Diagnosis not present

## 2018-07-14 DIAGNOSIS — J449 Chronic obstructive pulmonary disease, unspecified: Secondary | ICD-10-CM | POA: Diagnosis not present

## 2018-07-16 DIAGNOSIS — J449 Chronic obstructive pulmonary disease, unspecified: Secondary | ICD-10-CM | POA: Diagnosis not present

## 2018-07-16 DIAGNOSIS — I129 Hypertensive chronic kidney disease with stage 1 through stage 4 chronic kidney disease, or unspecified chronic kidney disease: Secondary | ICD-10-CM | POA: Diagnosis not present

## 2018-07-21 DIAGNOSIS — J449 Chronic obstructive pulmonary disease, unspecified: Secondary | ICD-10-CM | POA: Diagnosis not present

## 2018-07-21 DIAGNOSIS — I129 Hypertensive chronic kidney disease with stage 1 through stage 4 chronic kidney disease, or unspecified chronic kidney disease: Secondary | ICD-10-CM | POA: Diagnosis not present

## 2018-07-27 DIAGNOSIS — I129 Hypertensive chronic kidney disease with stage 1 through stage 4 chronic kidney disease, or unspecified chronic kidney disease: Secondary | ICD-10-CM | POA: Diagnosis not present

## 2018-07-27 DIAGNOSIS — J449 Chronic obstructive pulmonary disease, unspecified: Secondary | ICD-10-CM | POA: Diagnosis not present

## 2018-07-29 DIAGNOSIS — J449 Chronic obstructive pulmonary disease, unspecified: Secondary | ICD-10-CM | POA: Diagnosis not present

## 2018-07-29 DIAGNOSIS — I129 Hypertensive chronic kidney disease with stage 1 through stage 4 chronic kidney disease, or unspecified chronic kidney disease: Secondary | ICD-10-CM | POA: Diagnosis not present

## 2018-08-23 DIAGNOSIS — Z23 Encounter for immunization: Secondary | ICD-10-CM | POA: Diagnosis not present

## 2018-08-26 ENCOUNTER — Telehealth: Payer: Self-pay | Admitting: Internal Medicine

## 2018-08-26 MED ORDER — FLUTICASONE-SALMETEROL 250-50 MCG/DOSE IN AEPB: INHALATION_SPRAY | RESPIRATORY_TRACT | 3 refills | Status: AC

## 2018-08-26 NOTE — Telephone Encounter (Signed)
Called and spoke with patient she is requesting a refill of her Advair, refill sent in. Nothing further needed.

## 2018-09-11 DIAGNOSIS — H02889 Meibomian gland dysfunction of unspecified eye, unspecified eyelid: Secondary | ICD-10-CM | POA: Diagnosis not present

## 2018-09-11 DIAGNOSIS — L718 Other rosacea: Secondary | ICD-10-CM | POA: Diagnosis not present

## 2018-10-02 ENCOUNTER — Other Ambulatory Visit: Payer: Self-pay | Admitting: Internal Medicine

## 2018-11-24 DIAGNOSIS — E782 Mixed hyperlipidemia: Secondary | ICD-10-CM | POA: Diagnosis not present

## 2018-11-24 DIAGNOSIS — J449 Chronic obstructive pulmonary disease, unspecified: Secondary | ICD-10-CM | POA: Diagnosis not present

## 2018-11-24 DIAGNOSIS — Z85118 Personal history of other malignant neoplasm of bronchus and lung: Secondary | ICD-10-CM | POA: Diagnosis not present

## 2018-11-24 DIAGNOSIS — N183 Chronic kidney disease, stage 3 (moderate): Secondary | ICD-10-CM | POA: Diagnosis not present

## 2018-11-24 DIAGNOSIS — Z0000 Encounter for general adult medical examination without abnormal findings: Secondary | ICD-10-CM | POA: Diagnosis not present

## 2018-11-24 DIAGNOSIS — Z1389 Encounter for screening for other disorder: Secondary | ICD-10-CM | POA: Diagnosis not present

## 2018-11-24 DIAGNOSIS — K219 Gastro-esophageal reflux disease without esophagitis: Secondary | ICD-10-CM | POA: Diagnosis not present

## 2018-11-24 DIAGNOSIS — F325 Major depressive disorder, single episode, in full remission: Secondary | ICD-10-CM | POA: Diagnosis not present

## 2018-11-24 DIAGNOSIS — M519 Unspecified thoracic, thoracolumbar and lumbosacral intervertebral disc disorder: Secondary | ICD-10-CM | POA: Diagnosis not present

## 2018-11-24 DIAGNOSIS — I10 Essential (primary) hypertension: Secondary | ICD-10-CM | POA: Diagnosis not present

## 2018-11-24 DIAGNOSIS — M48061 Spinal stenosis, lumbar region without neurogenic claudication: Secondary | ICD-10-CM | POA: Diagnosis not present

## 2018-11-24 DIAGNOSIS — M1990 Unspecified osteoarthritis, unspecified site: Secondary | ICD-10-CM | POA: Diagnosis not present

## 2018-12-17 ENCOUNTER — Ambulatory Visit: Payer: Medicare Other | Admitting: Internal Medicine

## 2019-02-03 ENCOUNTER — Encounter: Payer: Self-pay | Admitting: Internal Medicine

## 2019-02-23 ENCOUNTER — Ambulatory Visit: Payer: Medicare Other | Admitting: Internal Medicine

## 2019-05-24 DIAGNOSIS — F325 Major depressive disorder, single episode, in full remission: Secondary | ICD-10-CM | POA: Diagnosis not present

## 2019-05-24 DIAGNOSIS — M1990 Unspecified osteoarthritis, unspecified site: Secondary | ICD-10-CM | POA: Diagnosis not present

## 2019-05-24 DIAGNOSIS — J449 Chronic obstructive pulmonary disease, unspecified: Secondary | ICD-10-CM | POA: Diagnosis not present

## 2019-05-24 DIAGNOSIS — I10 Essential (primary) hypertension: Secondary | ICD-10-CM | POA: Diagnosis not present

## 2019-05-24 DIAGNOSIS — L608 Other nail disorders: Secondary | ICD-10-CM | POA: Diagnosis not present

## 2019-05-24 DIAGNOSIS — E7800 Pure hypercholesterolemia, unspecified: Secondary | ICD-10-CM | POA: Diagnosis not present

## 2019-05-24 DIAGNOSIS — C3491 Malignant neoplasm of unspecified part of right bronchus or lung: Secondary | ICD-10-CM | POA: Diagnosis not present

## 2019-05-24 DIAGNOSIS — M48061 Spinal stenosis, lumbar region without neurogenic claudication: Secondary | ICD-10-CM | POA: Diagnosis not present

## 2019-05-24 DIAGNOSIS — N183 Chronic kidney disease, stage 3 (moderate): Secondary | ICD-10-CM | POA: Diagnosis not present

## 2019-05-24 DIAGNOSIS — M810 Age-related osteoporosis without current pathological fracture: Secondary | ICD-10-CM | POA: Diagnosis not present

## 2019-06-09 ENCOUNTER — Ambulatory Visit (INDEPENDENT_AMBULATORY_CARE_PROVIDER_SITE_OTHER): Payer: Medicare Other | Admitting: Podiatry

## 2019-06-09 ENCOUNTER — Other Ambulatory Visit: Payer: Self-pay

## 2019-06-09 ENCOUNTER — Encounter: Payer: Self-pay | Admitting: Podiatry

## 2019-06-09 DIAGNOSIS — M79674 Pain in right toe(s): Secondary | ICD-10-CM | POA: Diagnosis not present

## 2019-06-09 DIAGNOSIS — M79675 Pain in left toe(s): Secondary | ICD-10-CM

## 2019-06-09 DIAGNOSIS — B351 Tinea unguium: Secondary | ICD-10-CM | POA: Diagnosis not present

## 2019-06-09 NOTE — Progress Notes (Signed)
Complaint:  Visit Type: Patient returns to my office for continued preventative foot care services. Complaint: Patient states" my nails have grown long and thick and become painful to walk and wear shoes" She says her second toenail right foot is thicker than the others due to her hammer toe. The patient presents for preventative foot care services. No changes to ROS  Podiatric Exam: Vascular: dorsalis pedis and posterior tibial pulses are palpable bilateral. Capillary return is immediate. Temperature gradient is WNL. Skin turgor WNL  Sensorium: Normal Semmes Weinstein monofilament test. Normal tactile sensation bilaterally. Nail Exam: Pt has thick disfigured discolored nails with subungual debris hallux nails  B/L. Ulcer Exam: There is no evidence of ulcer or pre-ulcerative changes or infection. Orthopedic Exam: Muscle tone and strength are WNL. No limitations in general ROM. No crepitus or effusions noted. Foot type and digits show no abnormalities. Bony prominences are unremarkable. Skin: No Porokeratosis. No infection or ulcers  Diagnosis:  Onychomycosis, , Pain in right toe, pain in left toes  Treatment & Plan Procedures and Treatment: Consent by patient was obtained for treatment procedures.   Debridement of mycotic and hypertrophic toenails, 1 through 5 bilateral and clearing of subungual debris. No ulceration, no infection noted.  Return Visit-Office Procedure: Patient instructed to return to the office for a follow up visit 4 months for continued evaluation and treatment.    Gardiner Barefoot DPM

## 2019-06-25 ENCOUNTER — Ambulatory Visit (INDEPENDENT_AMBULATORY_CARE_PROVIDER_SITE_OTHER): Payer: Medicare Other | Admitting: Internal Medicine

## 2019-06-25 ENCOUNTER — Other Ambulatory Visit: Payer: Self-pay

## 2019-06-25 ENCOUNTER — Ambulatory Visit (INDEPENDENT_AMBULATORY_CARE_PROVIDER_SITE_OTHER): Payer: Medicare Other

## 2019-06-25 ENCOUNTER — Encounter: Payer: Self-pay | Admitting: Internal Medicine

## 2019-06-25 VITALS — BP 110/60 | HR 78 | Temp 98.1°F | Ht 61.5 in | Wt 156.4 lb

## 2019-06-25 DIAGNOSIS — C3411 Malignant neoplasm of upper lobe, right bronchus or lung: Secondary | ICD-10-CM | POA: Diagnosis not present

## 2019-06-25 DIAGNOSIS — Z23 Encounter for immunization: Secondary | ICD-10-CM

## 2019-06-25 DIAGNOSIS — R0602 Shortness of breath: Secondary | ICD-10-CM | POA: Diagnosis not present

## 2019-06-25 DIAGNOSIS — J449 Chronic obstructive pulmonary disease, unspecified: Secondary | ICD-10-CM

## 2019-06-25 NOTE — Assessment & Plan Note (Addendum)
I can't tell that there has been any real change or current acute problem. She is frail and would benefit if [facility] can help with any sort of home assistance.  Plan- flu vax, CXR, replace broken mouth piece and hose for her nebulizer machine. Overnight oximetry.

## 2019-06-25 NOTE — Patient Instructions (Signed)
Order- CXR     Dx asthma/ COPD overlap  Order- senior flu vax  Order- schedule overnight oximetry on room air      Replace Plastic mouthpiece and hose for nebulizer machine  [facility]- is home assessment available for home care needs- senior living alone

## 2019-06-25 NOTE — Assessment & Plan Note (Signed)
No recurrence so far. Plan- update CXR

## 2019-06-25 NOTE — Progress Notes (Signed)
Patient ID: [name] [name], female    DOB: Jan 07, 1927, 85 y.o.   MRN: [id number]  HPI Female former smoker (100 pack years ) followed for asthma/COPD, allergic rhinitis, chronic sinusitis, remote history lung cancer PFT 2014 reviewed-mild COPD with insignificant response to bronchodilato --------------------------------------------------------------------------------  12/11/17- 85 year old female former smoker followed for asthma/COPD, allergic rhinitis, chronic sinusitis, remote history lung cancer/right upper lobectomy, lung nodule/oncology follow-up Proventil HFA, Nasonex, neb xopenex,  Advair 250,  Occasional dry cough and wheeze.  Uses rescue inhaler mostly at night.  Little wheeze or shortness of breath noticed when she is up and about at home in the daytime-not very active.  No acute process. CXR 11/13/16- 1V-  IMPRESSION: 1. Lower lobe scarring. 2. No acute findings.  06/25/2019-  85 year old female former smoker followed for asthma/COPD, allergic rhinitis, chronic sinusitis, remote history lung cancer/right upper lobectomy, lung nodule/oncology follow-up Proventil HFA, Nasonex, neb xopenex,  Advair 250,  -----f/u for COPD; pt states her breathing has been worsening, taking Advair as directed, using albuterol PRN mostly at night if she gets short of breath Lives alone, drives self short distances. Feel isolated. Grandaughter brings groceries, no one to clean. Wishes she had moved to a nursing home. More SOB some times than others, occ cough white. No definite pattern, no change. Very inactive in home. Wonders if oxygen would help.  Review of Systems-see HPI    + = positive Constitutional:   No-   weight loss, night sweats, fevers, chills, fatigue, lassitude. HEENT:   + headaches, no-difficulty swallowing, tooth/dental problems, sore throat,       No-  sneezing, itching, ear ache, nasal congestion, post nasal drip,  CV:  No-   chest pain, orthopnea, PND, swelling in lower extremities,  anasarca, dizziness, palpitations Resp: +   shortness of breath with exertion or at rest.                productive cough,  +non-productive cough,  No-  coughing up of blood.              No-   change in color of mucus.  + wheezing.   Skin: No-   rash or lesions. GI:  No-   heartburn, indigestion, abdominal pain, nausea, vomiting,  GU:   MS:  No-   joint pain or swelling.  .  + back pain. Neuro- weak and dizzy with exertion Psych:  No- change in mood or affect. No depression or anxiety.  No memory loss.  Objective:   Physical Exam   Arrival room air sat 95%. General- Alert, Oriented, Affect-appropriate, Distress- nonacute, Frail elderly Skin-  Lymphadenopathy- none Head- atraumatic            Eyes- Gross vision intact, PERRLA, conjunctivae clear secretions            Ears- Hearing, canals normal            Nose- Clear, No-Septal dev, mucus, polyps, erosion, perforation             Throat- Mallampati III , mucosa clear , drainage- none, tonsils- atrophic.  Neck- flexible , trachea midline, no stridor , thyroid nl, carotid no bruit Chest - symmetrical excursion , unlabored           Heart/CV- RRR , no murmur , no gallop  , no rub, nl s1 s2  .  JVD- none , edema- none, stasis changes- none, varices- none           Lung- +decreased sounds in bases, wheeze- none, cough- none , dullness-none, rub-  none            Chest wall-  Abd-  Br/ Gen/ Rectal- Not done, not indicated Extrem- cyanosis- none, clubbing, none, atrophy- none, strength- nl,                  + Compression hose Neuro-

## 2019-06-29 ENCOUNTER — Other Ambulatory Visit: Payer: Self-pay

## 2019-06-30 ENCOUNTER — Other Ambulatory Visit: Payer: Self-pay

## 2019-06-30 ENCOUNTER — Telehealth: Payer: Self-pay | Admitting: Internal Medicine

## 2019-06-30 DIAGNOSIS — J449 Chronic obstructive pulmonary disease, unspecified: Secondary | ICD-10-CM | POA: Diagnosis not present

## 2019-06-30 DIAGNOSIS — G4734 Idiopathic sleep related nonobstructive alveolar hypoventilation: Secondary | ICD-10-CM

## 2019-06-30 NOTE — Telephone Encounter (Signed)
Pending receipt of ONO in which [employer] is faxing over.

## 2019-06-30 NOTE — Patient Outreach (Signed)
[facility]) Care Management  06/30/2019  [name] [name] 03-31-1927 [id number]  TELEPHONE SCREENING Referral date: 06/25/19 Referral source: primary MD  Referral reason: Home assessment for home care needs Insurance: medicare  Telephone call to patient regarding primary MD referral. HIPAA verified by patient. RNCM explained reason for call.    Social: Patient states she is living alone caring for herself.  She reports her granddaughter and her husband bring her groceries every 2 weeks. Patient states she is having a harder time trying to manage the care/ upkeep of her home. Patient states she is not sure if she should continue to live where she is living.  Patient states she does not have a lot of family to assist her.  Patient states, " I have outlive a lot of my family and friends."  Patient states does have periods of feeling lonely.She states due to [place] she is not able to have many visitors.  Patient states she takes 1/2 valium to help with anxiety.  Patient reports she is able to do her own bathing and dressing but it takes her a long time due to shortness of breath.  Patient states no matter what she does she gets short of breath. She states her doctor is aware. Patient states she has arthritis that causes her back and hips to hurt. She reports using a cane and walker for ambulation. Patient denies any falls within the past year.   Conditions: Patient reports having COPD. She states she feels she is managing this condition with her medication, inhalers and nebulizer. Patient reports the musinex helps her considerably. Patient denies need for follow up with nurse at this time.   Medications: Patient verbalizes understanding of her medications. She reports she is able to afford her medications and takes them as prescribed.   Appointments: Patient reports having a recent virtual visit appointment with her primary MD on 06/25/19.  Transportation:  Patient states she is  able to drive herself short distances. She states it would be nice to have a back up in the event she is not able to drive herself.   Advance Directives: Patient states she had an advanced directive at one time but it needs to be updated.   Consent: RNCM discussed and offered [facility] care management services. Patient verbally agreed to follow up with social worker to assist   ASSESSMENT;  PHQ2 - 5,   PHQ9 - 13  PLAN:  RNCM will refer patient to [place] worker for transportation assistance, Advance directive, to discuss possible assisted living/ nursing home, anxiety/ depression assessment, and home care assistance.    Quinn Plowman RN,BSN,CCM [facility] Telephonic  [phone]

## 2019-07-01 NOTE — Telephone Encounter (Signed)
Attempted to call x2. Phone line just rings and rings with no voicemail that kicks in.   Will try back to tomorrow.

## 2019-07-01 NOTE — Telephone Encounter (Signed)
ONO has been received. As CY is unavailable until 07/07/19, will forward to APP of the day, Wyn Quaker. ONO results were hand delivered to [facility] to advise. Will copy CY as an FYI.   Aaron Edelman please advise on results and recs please. Thanks.

## 2019-07-01 NOTE — Telephone Encounter (Signed)
ATC pt. Line continued to ring without VM. WCB.  

## 2019-07-01 NOTE — Telephone Encounter (Signed)
07/01/2019 1041  06/28/2019-overnight oximetry on room air- duration of sleep 7 hours and 24 seconds, time spent below 88% 37 minutes, SaO2 low 76%, average SPO2 90.7%.  Patient does in fact qualify for oxygen.  Will start patient on 2 L of O2 at night.  Please also route this message to Dr. Annamaria Boots as an FYI the patient did qualify for oxygen.  He can make the clinical decision on whether or not he would like for the patient to have a repeat ONO.  Wyn Quaker FNP

## 2019-07-02 NOTE — Telephone Encounter (Signed)
Called and spoke to patient.  Relayed message of results from Wyn Quaker, NP.  Patient verbalized understanding. Patient asked appropriate questions.  Order placed per Warner Mccreedy, NP.  Nothing further needed at this time.  Routing to [employer] as FYI.

## 2019-07-06 ENCOUNTER — Other Ambulatory Visit: Payer: Self-pay | Admitting: *Deleted

## 2019-07-06 NOTE — Patient Outreach (Signed)
[facility]) Care Management  07/06/2019  [name] [name] 1927-05-10 [id number]   CSW made contact with pt and confirmed pt's identity. CSW introduced self and reason for call. Per pt, she lives alone, "drives some but not much".  She has 2 sons who are both deceased and does have support from a granddaughter who helps with groceries, etc.  Pt states she has considered moving to an ALF/retirement community (about 3 years ago) and admits "it would probably be best".  CSW discussed coverage/cost and the process of seeking such placement.   Pt is encouraged to discuss further with family and assess her financial picture to better determine options.   Pt also admits to anxiety and depression; "due to grief" from deaths of her 2 sons and other family and friends.  CSW discussed the hospice bereavement support program and will mail her info on this.   She denies SI/HI and takes valium PRN which helps to "straighten me out".    Pt requests a follow up call in a few weeks to assess and discuss things further.  She is also interested in Advance Directive/HCPOA completion and will mail packet to her for follow up as well.    Eduard Clos, MSW, [place] Worker  [place] [phone]

## 2019-07-21 ENCOUNTER — Other Ambulatory Visit: Payer: Self-pay | Admitting: *Deleted

## 2019-07-21 NOTE — Patient Outreach (Signed)
[facility]) Care Management  07/21/2019  FREDRICKA KOHRS 09-05-1927 [id number]   [place] with pt who reports she does not want to pursue placement at ALF anytime soon because of the Corona virus. "Too many limitations and restrictions" as well as higher risk for exposure.  Pt denies any current concerns or needs. CSW will plan to touch base again in 2-3 weeks.   Eduard Clos, MSW, [place] Worker  [place] [phone]

## 2019-08-09 ENCOUNTER — Other Ambulatory Visit: Payer: Self-pay | Admitting: *Deleted

## 2019-08-09 NOTE — Patient Outreach (Signed)
[facility]) Care Management  08/09/2019  [name] [name] December 28, 1926 [id number]  CSW spoke with pt today who reports she is doing well. She has decided she does not plan to move into an ALF or retirement community at this time and will likely wait "a few years".  CSW encouraged her to reach out if needs arise and/or to make her PCP aware who can reconsult [place].   CSW will plan to sign off and close CSW case. CSW will advise PCP and [facility] team of above.   Eduard Clos, MSW, [place] Worker  [place] [phone]

## 2019-10-06 ENCOUNTER — Other Ambulatory Visit: Payer: Self-pay

## 2019-10-06 ENCOUNTER — Ambulatory Visit (INDEPENDENT_AMBULATORY_CARE_PROVIDER_SITE_OTHER): Payer: Medicare Other | Admitting: Podiatry

## 2019-10-06 ENCOUNTER — Encounter: Payer: Self-pay | Admitting: Podiatry

## 2019-10-06 DIAGNOSIS — M79674 Pain in right toe(s): Secondary | ICD-10-CM | POA: Diagnosis not present

## 2019-10-06 DIAGNOSIS — M79675 Pain in left toe(s): Secondary | ICD-10-CM

## 2019-10-06 DIAGNOSIS — B351 Tinea unguium: Secondary | ICD-10-CM

## 2019-10-06 NOTE — Progress Notes (Signed)
Complaint:  Visit Type: Patient returns to my office for continued preventative foot care services. Complaint: Patient states" my nails have grown long and thick and become painful to walk and wear shoes" She says her second toenail right foot is thicker than the others due to her hammer toe. The patient presents for preventative foot care services. No changes to ROS  Podiatric Exam: Vascular: dorsalis pedis and posterior tibial pulses are palpable bilateral. Capillary return is immediate. Temperature gradient is WNL. Skin turgor WNL  Sensorium: Normal Semmes Weinstein monofilament test. Normal tactile sensation bilaterally. Nail Exam: Pt has thick disfigured discolored nails with subungual debris hallux nails  B/L. Ulcer Exam: There is no evidence of ulcer or pre-ulcerative changes or infection. Orthopedic Exam: Muscle tone and strength are WNL. No limitations in general ROM. No crepitus or effusions noted. Foot type and digits show no abnormalities. Bony prominences are unremarkable. Skin: No Porokeratosis. No infection or ulcers  Diagnosis:  Onychomycosis, , Pain in right toe, pain in left toes  Treatment & Plan Procedures and Treatment: Consent by patient was obtained for treatment procedures.   Debridement of mycotic and hypertrophic toenails, 1 through 5 bilateral and clearing of subungual debris. No ulceration, no infection noted.  Return Visit-Office Procedure: Patient instructed to return to the office for a follow up visit 4 months for continued evaluation and treatment.    Gardiner Barefoot DPM

## 2019-10-11 DIAGNOSIS — H04123 Dry eye syndrome of bilateral lacrimal glands: Secondary | ICD-10-CM | POA: Diagnosis not present

## 2019-10-11 DIAGNOSIS — Z961 Presence of intraocular lens: Secondary | ICD-10-CM | POA: Diagnosis not present

## 2019-10-11 DIAGNOSIS — H0289 Other specified disorders of eyelid: Secondary | ICD-10-CM | POA: Diagnosis not present

## 2019-10-25 ENCOUNTER — Encounter: Payer: Self-pay | Admitting: Internal Medicine

## 2019-10-25 ENCOUNTER — Other Ambulatory Visit: Payer: Self-pay

## 2019-10-25 ENCOUNTER — Ambulatory Visit (INDEPENDENT_AMBULATORY_CARE_PROVIDER_SITE_OTHER): Payer: Medicare Other | Admitting: Internal Medicine

## 2019-10-25 DIAGNOSIS — C3411 Malignant neoplasm of upper lobe, right bronchus or lung: Secondary | ICD-10-CM | POA: Diagnosis not present

## 2019-10-25 DIAGNOSIS — J449 Chronic obstructive pulmonary disease, unspecified: Secondary | ICD-10-CM

## 2019-10-25 NOTE — Patient Instructions (Signed)
Sample Anoro inhaler   Inhale 1 puff, once daily  Try this instead of Advair. When you finish the sample, go back to Advair. If you decide that Anoro works better for you, then you can call and we will send a prescription for Anoro  Please call if we can help

## 2019-10-25 NOTE — Assessment & Plan Note (Signed)
No recurrence as of CXR 06/25/2019

## 2019-10-25 NOTE — Progress Notes (Signed)
Patient ID: [name] [name], female    DOB: 09-13-1927, 85 y.o.   MRN: [id number]  HPI Female former smoker (100 pack years ) followed for asthma/COPD, allergic rhinitis, chronic sinusitis, remote history lung cancer PFT 2014 reviewed-mild COPD with insignificant response to bronchodilato --------------------------------------------------------------------------------   06/25/2019-  85 year old female former smoker followed for asthma/COPD, allergic rhinitis, chronic sinusitis, remote history lung cancer/right upper lobectomy, lung nodule/oncology follow-up Proventil HFA, Nasonex, neb xopenex,  Advair 250,  -----f/u for COPD; pt states her breathing has been worsening, taking Advair as directed, using albuterol PRN mostly at night if she gets short of breath Lives alone, drives self short distances. Feel isolated. Grandaughter brings groceries, no one to clean. Wishes she had moved to a nursing home. More SOB some times than others, occ cough white. No definite pattern, no change. Very inactive in home. Wonders if oxygen would help.  10/25/2019- 85 year old female former smoker followed for asthma/COPD, allergic rhinitis, chronic sinusitis, remote history lung cancer/right upper lobectomy, lung nodule/oncology follow-up Proventil HFA, Nasonex, neb xopenex,  Advair 250,  -----f/u COPD/ Asthma . SOB with walking. Albuterol lhfa, Advair  250, neb Xopenex,  No significant change or acute episodes. Remains somewhat DOE, ok at rest, little cough or wheeze, no edema or palpitation. Had flu vax CXR 06/25/2019-  Cardiac shadows within normal limits. Aortic calcifications are noted. The lungs are well aerated bilaterally. Scarring is again seen in the bases bilaterally. No focal infiltrate or sizable effusion is seen. No bony abnormality is noted. IMPRESSION: No acute abnormality noted.  Review of Systems-see HPI    + = positive Constitutional:   No-   weight loss, night sweats, fevers, chills,  fatigue, lassitude. HEENT:   + headaches, no-difficulty swallowing, tooth/dental problems, sore throat,       No-  sneezing, itching, ear ache, nasal congestion, post nasal drip,  CV:  No-   chest pain, orthopnea, PND, swelling in lower extremities, anasarca, dizziness, palpitations Resp: +   shortness of breath with exertion or at rest.                productive cough,  non-productive cough,  No-  coughing up of blood.              No-   change in color of mucus.   wheezing.   Skin: No-   rash or lesions. GI:  No-   heartburn, indigestion, abdominal pain, nausea, vomiting,  GU:   MS:  No-   joint pain or swelling.  .  + back pain. Neuro- weak and dizzy with exertion Psych:  No- change in mood or affect. No depression or anxiety.  No memory loss.  Objective:   Physical Exam   Arrival room air sat 95%. General- Alert, Oriented, Affect-appropriate, Distress- nonacute, Frail elderly Skin-  Lymphadenopathy- none Head- atraumatic            Eyes- Gross vision intact, PERRLA, conjunctivae clear secretions            Ears- + cerumen R            Nose- Clear, No-Septal dev, mucus, polyps, erosion, perforation             Throat- Mallampati III , mucosa clear , drainage- none, tonsils- atrophic.  Neck- flexible , trachea midline, no stridor , thyroid nl, carotid no bruit Chest - symmetrical excursion , unlabored           Heart/CV- RRR , no murmur ,  no gallop  , no rub, nl s1 s2  .                            JVD- none , edema- none, stasis changes- none, varices- none           Lung- +decreased sounds in bases, wheeze- none, cough- none , dullness-none, rub-  none            Chest wall-  Abd-  Br/ Gen/ Rectal- Not done, not indicated Extrem- cyanosis- none, clubbing, none, atrophy- none, strength- nl,                  + Compression hose Neuro-

## 2019-10-25 NOTE — Assessment & Plan Note (Signed)
I can't tell any change or acute process. Plan- try sample Anoro instead of Advair. Call us for script if preferred.

## 2019-11-25 ENCOUNTER — Ambulatory Visit: Payer: Medicare Other | Attending: Internal Medicine

## 2019-11-25 DIAGNOSIS — Z23 Encounter for immunization: Secondary | ICD-10-CM | POA: Insufficient documentation

## 2019-11-25 NOTE — Progress Notes (Signed)
   Covid-19 Vaccination Clinic  Name:  [name] [name]    MRN: [id number] DOB: Nov 09, 1926  11/25/2019  [name] was observed post Covid-19 immunization for 15 minutes without incidence. She was provided with Vaccine Information Sheet and instruction to access the V-Safe system.   [name] was instructed to call 911 with any severe reactions post vaccine: Marland Kitchen Difficulty breathing  . Swelling of your face and throat  . A fast heartbeat  . A bad rash all over your body  . Dizziness and weakness    Immunizations Administered    Name Date Dose VIS Date Route   Pfizer COVID-19 Vaccine 11/25/2019  1:21 PM 0.3 mL 10/15/2019 Intramuscular   Manufacturer: [place]   Lot: [id number]   [place]: [address]-6

## 2019-11-29 DIAGNOSIS — Z1389 Encounter for screening for other disorder: Secondary | ICD-10-CM | POA: Diagnosis not present

## 2019-11-29 DIAGNOSIS — M48061 Spinal stenosis, lumbar region without neurogenic claudication: Secondary | ICD-10-CM | POA: Diagnosis not present

## 2019-11-29 DIAGNOSIS — C3491 Malignant neoplasm of unspecified part of right bronchus or lung: Secondary | ICD-10-CM | POA: Diagnosis not present

## 2019-11-29 DIAGNOSIS — N1831 Chronic kidney disease, stage 3a: Secondary | ICD-10-CM | POA: Diagnosis not present

## 2019-11-29 DIAGNOSIS — M1990 Unspecified osteoarthritis, unspecified site: Secondary | ICD-10-CM | POA: Diagnosis not present

## 2019-11-29 DIAGNOSIS — J449 Chronic obstructive pulmonary disease, unspecified: Secondary | ICD-10-CM | POA: Diagnosis not present

## 2019-11-29 DIAGNOSIS — M810 Age-related osteoporosis without current pathological fracture: Secondary | ICD-10-CM | POA: Diagnosis not present

## 2019-11-29 DIAGNOSIS — F325 Major depressive disorder, single episode, in full remission: Secondary | ICD-10-CM | POA: Diagnosis not present

## 2019-11-29 DIAGNOSIS — Z0000 Encounter for general adult medical examination without abnormal findings: Secondary | ICD-10-CM | POA: Diagnosis not present

## 2019-11-29 DIAGNOSIS — R269 Unspecified abnormalities of gait and mobility: Secondary | ICD-10-CM | POA: Diagnosis not present

## 2019-11-29 DIAGNOSIS — E782 Mixed hyperlipidemia: Secondary | ICD-10-CM | POA: Diagnosis not present

## 2019-11-29 DIAGNOSIS — I10 Essential (primary) hypertension: Secondary | ICD-10-CM | POA: Diagnosis not present

## 2019-12-16 ENCOUNTER — Ambulatory Visit: Payer: Medicare Other | Attending: Internal Medicine

## 2019-12-16 DIAGNOSIS — Z23 Encounter for immunization: Secondary | ICD-10-CM

## 2019-12-16 NOTE — Progress Notes (Signed)
   Covid-19 Vaccination Clinic  Name:  [name] [name]    MRN: [id number] DOB: 09-11-1927  12/16/2019  [name] was observed post Covid-19 immunization for 15 minutes without incidence. She was provided with Vaccine Information Sheet and instruction to access the V-Safe system.   [name] was instructed to call 911 with any severe reactions post vaccine: Marland Kitchen Difficulty breathing  . Swelling of your face and throat  . A fast heartbeat  . A bad rash all over your body  . Dizziness and weakness    Immunizations Administered    Name Date Dose VIS Date Route   Pfizer COVID-19 Vaccine 12/16/2019  1:14 PM 0.3 mL 10/15/2019 Intramuscular   Manufacturer: [place]   Lot: [id number]   [place]: [address]-2

## 2020-02-09 ENCOUNTER — Ambulatory Visit: Payer: Medicare Other | Admitting: Podiatry

## 2020-03-13 ENCOUNTER — Ambulatory Visit
Admission: RE | Admit: 2020-03-13 | Discharge: 2020-03-13 | Disposition: A | Discharge status: Home / Self Care | Payer: Medicare Other | Source: Ambulatory Visit | Attending: Internal Medicine | Admitting: Internal Medicine

## 2020-03-13 ENCOUNTER — Other Ambulatory Visit: Payer: Self-pay | Admitting: Internal Medicine

## 2020-03-13 DIAGNOSIS — J449 Chronic obstructive pulmonary disease, unspecified: Secondary | ICD-10-CM | POA: Diagnosis not present

## 2020-03-13 DIAGNOSIS — C3490 Malignant neoplasm of unspecified part of unspecified bronchus or lung: Secondary | ICD-10-CM | POA: Diagnosis not present

## 2020-03-13 DIAGNOSIS — R0602 Shortness of breath: Secondary | ICD-10-CM | POA: Diagnosis not present

## 2020-03-13 DIAGNOSIS — M25512 Pain in left shoulder: Secondary | ICD-10-CM | POA: Diagnosis not present

## 2020-03-13 DIAGNOSIS — R05 Cough: Secondary | ICD-10-CM | POA: Diagnosis not present

## 2020-03-14 ENCOUNTER — Ambulatory Visit: Payer: Medicare Other | Admitting: Podiatry

## 2020-03-17 DIAGNOSIS — M25512 Pain in left shoulder: Secondary | ICD-10-CM | POA: Diagnosis not present

## 2020-03-17 DIAGNOSIS — M7542 Impingement syndrome of left shoulder: Secondary | ICD-10-CM | POA: Diagnosis not present

## 2020-04-11 ENCOUNTER — Ambulatory Visit: Payer: Medicare Other | Admitting: Internal Medicine

## 2020-04-25 ENCOUNTER — Encounter: Payer: Self-pay | Admitting: Podiatry

## 2020-04-25 ENCOUNTER — Ambulatory Visit (INDEPENDENT_AMBULATORY_CARE_PROVIDER_SITE_OTHER): Payer: Medicare Other | Admitting: Podiatry

## 2020-04-25 ENCOUNTER — Other Ambulatory Visit: Payer: Self-pay

## 2020-04-25 VITALS — Temp 96.2°F

## 2020-04-25 DIAGNOSIS — M79675 Pain in left toe(s): Secondary | ICD-10-CM

## 2020-04-25 DIAGNOSIS — M79674 Pain in right toe(s): Secondary | ICD-10-CM | POA: Diagnosis not present

## 2020-04-25 DIAGNOSIS — B351 Tinea unguium: Secondary | ICD-10-CM

## 2020-04-25 NOTE — Progress Notes (Signed)
This patient returns to the office for evaluation and treatment of long thick painful nails .  This patient is unable to trim his own nails since the patient cannot reach his feet.  Patient says the nails are painful walking and wearing his shoes.  He returns for preventive foot care services. Patient presents to the office with her daughter.  General Appearance  Alert, conversant and in no acute stress.  Vascular  Dorsalis pedis and posterior tibial  pulses are palpable  bilaterally.  Capillary return is within normal limits  bilaterally. Temperature is within normal limits  bilaterally.  Neurologic  Senn-Weinstein monofilament wire test within normal limits  bilaterally. Muscle power within normal limits bilaterally.  Nails Thick disfigured discolored nails with subungual debris  Hallux nails  bilaterally. No evidence of bacterial infection or drainage bilaterally.  Orthopedic  No limitations of motion  feet .  No crepitus or effusions noted.  No bony pathology or digital deformities noted.  Skin  normotropic skin with no porokeratosis noted bilaterally.  No signs of infections or ulcers noted.     Onychomycosis  Pain in toes right foot  Pain in toes left foot  Debridement  of nails  1-5  B/L with a nail nipper.  Nails were then filed using a dremel tool with no incidents.    RTC 4 months    Gardiner Barefoot DPM

## 2020-05-01 DIAGNOSIS — F329 Major depressive disorder, single episode, unspecified: Secondary | ICD-10-CM | POA: Diagnosis not present

## 2020-05-01 DIAGNOSIS — I10 Essential (primary) hypertension: Secondary | ICD-10-CM | POA: Diagnosis not present

## 2020-05-01 DIAGNOSIS — M1990 Unspecified osteoarthritis, unspecified site: Secondary | ICD-10-CM | POA: Diagnosis not present

## 2020-05-01 DIAGNOSIS — M810 Age-related osteoporosis without current pathological fracture: Secondary | ICD-10-CM | POA: Diagnosis not present

## 2020-05-01 DIAGNOSIS — F325 Major depressive disorder, single episode, in full remission: Secondary | ICD-10-CM | POA: Diagnosis not present

## 2020-05-01 DIAGNOSIS — C3491 Malignant neoplasm of unspecified part of right bronchus or lung: Secondary | ICD-10-CM | POA: Diagnosis not present

## 2020-05-01 DIAGNOSIS — J449 Chronic obstructive pulmonary disease, unspecified: Secondary | ICD-10-CM | POA: Diagnosis not present

## 2020-05-01 DIAGNOSIS — F322 Major depressive disorder, single episode, severe without psychotic features: Secondary | ICD-10-CM | POA: Diagnosis not present

## 2020-05-01 DIAGNOSIS — E7800 Pure hypercholesterolemia, unspecified: Secondary | ICD-10-CM | POA: Diagnosis not present

## 2020-05-01 DIAGNOSIS — N1831 Chronic kidney disease, stage 3a: Secondary | ICD-10-CM | POA: Diagnosis not present

## 2020-05-01 DIAGNOSIS — E782 Mixed hyperlipidemia: Secondary | ICD-10-CM | POA: Diagnosis not present

## 2020-05-01 DIAGNOSIS — C3490 Malignant neoplasm of unspecified part of unspecified bronchus or lung: Secondary | ICD-10-CM | POA: Diagnosis not present

## 2020-05-23 ENCOUNTER — Ambulatory Visit: Payer: Medicare Other | Admitting: Internal Medicine

## 2020-05-29 DIAGNOSIS — I10 Essential (primary) hypertension: Secondary | ICD-10-CM | POA: Diagnosis not present

## 2020-05-29 DIAGNOSIS — N1831 Chronic kidney disease, stage 3a: Secondary | ICD-10-CM | POA: Diagnosis not present

## 2020-05-29 DIAGNOSIS — C3491 Malignant neoplasm of unspecified part of right bronchus or lung: Secondary | ICD-10-CM | POA: Diagnosis not present

## 2020-05-29 DIAGNOSIS — K219 Gastro-esophageal reflux disease without esophagitis: Secondary | ICD-10-CM | POA: Diagnosis not present

## 2020-05-29 DIAGNOSIS — J449 Chronic obstructive pulmonary disease, unspecified: Secondary | ICD-10-CM | POA: Diagnosis not present

## 2020-05-29 DIAGNOSIS — E782 Mixed hyperlipidemia: Secondary | ICD-10-CM | POA: Diagnosis not present

## 2020-05-29 DIAGNOSIS — M48061 Spinal stenosis, lumbar region without neurogenic claudication: Secondary | ICD-10-CM | POA: Diagnosis not present

## 2020-05-29 DIAGNOSIS — L719 Rosacea, unspecified: Secondary | ICD-10-CM | POA: Diagnosis not present

## 2020-05-29 DIAGNOSIS — R269 Unspecified abnormalities of gait and mobility: Secondary | ICD-10-CM | POA: Diagnosis not present

## 2020-05-29 DIAGNOSIS — I700 Atherosclerosis of aorta: Secondary | ICD-10-CM | POA: Diagnosis not present

## 2020-05-29 DIAGNOSIS — F325 Major depressive disorder, single episode, in full remission: Secondary | ICD-10-CM | POA: Diagnosis not present

## 2020-06-02 ENCOUNTER — Telehealth: Payer: Self-pay | Admitting: Internal Medicine

## 2020-06-02 DIAGNOSIS — F322 Major depressive disorder, single episode, severe without psychotic features: Secondary | ICD-10-CM | POA: Diagnosis not present

## 2020-06-02 DIAGNOSIS — C3491 Malignant neoplasm of unspecified part of right bronchus or lung: Secondary | ICD-10-CM | POA: Diagnosis not present

## 2020-06-02 DIAGNOSIS — N1831 Chronic kidney disease, stage 3a: Secondary | ICD-10-CM | POA: Diagnosis not present

## 2020-06-02 DIAGNOSIS — G4700 Insomnia, unspecified: Secondary | ICD-10-CM | POA: Diagnosis not present

## 2020-06-02 DIAGNOSIS — J449 Chronic obstructive pulmonary disease, unspecified: Secondary | ICD-10-CM | POA: Diagnosis not present

## 2020-06-02 DIAGNOSIS — C3490 Malignant neoplasm of unspecified part of unspecified bronchus or lung: Secondary | ICD-10-CM | POA: Diagnosis not present

## 2020-06-02 DIAGNOSIS — M1990 Unspecified osteoarthritis, unspecified site: Secondary | ICD-10-CM | POA: Diagnosis not present

## 2020-06-02 DIAGNOSIS — M810 Age-related osteoporosis without current pathological fracture: Secondary | ICD-10-CM | POA: Diagnosis not present

## 2020-06-02 DIAGNOSIS — F329 Major depressive disorder, single episode, unspecified: Secondary | ICD-10-CM | POA: Diagnosis not present

## 2020-06-02 DIAGNOSIS — F325 Major depressive disorder, single episode, in full remission: Secondary | ICD-10-CM | POA: Diagnosis not present

## 2020-06-02 DIAGNOSIS — E7800 Pure hypercholesterolemia, unspecified: Secondary | ICD-10-CM | POA: Diagnosis not present

## 2020-06-02 DIAGNOSIS — I10 Essential (primary) hypertension: Secondary | ICD-10-CM | POA: Diagnosis not present

## 2020-06-02 NOTE — Telephone Encounter (Signed)
Spoke with pt. She is requesting a new nebulizer machine and nebulizer tubing kits. These would need to come from [place].  CY - please advise. Thanks.

## 2020-06-02 NOTE — Telephone Encounter (Signed)
Ok to order replacement compressor nebulizer, hose and supplies     Dx COPD mixed type

## 2020-06-02 NOTE — Telephone Encounter (Signed)
Patient contacted that new order for replacement nebulizer sent to [facility].

## 2020-06-05 ENCOUNTER — Telehealth: Payer: Self-pay

## 2020-06-05 ENCOUNTER — Telehealth: Payer: Self-pay | Admitting: Internal Medicine

## 2020-06-05 NOTE — Telephone Encounter (Signed)
Sending staff message instead

## 2020-06-05 NOTE — Telephone Encounter (Signed)
Duplicate. Error

## 2020-06-27 DIAGNOSIS — I10 Essential (primary) hypertension: Secondary | ICD-10-CM | POA: Diagnosis not present

## 2020-06-27 DIAGNOSIS — C3490 Malignant neoplasm of unspecified part of unspecified bronchus or lung: Secondary | ICD-10-CM | POA: Diagnosis not present

## 2020-06-27 DIAGNOSIS — M1990 Unspecified osteoarthritis, unspecified site: Secondary | ICD-10-CM | POA: Diagnosis not present

## 2020-06-27 DIAGNOSIS — N1831 Chronic kidney disease, stage 3a: Secondary | ICD-10-CM | POA: Diagnosis not present

## 2020-06-27 DIAGNOSIS — M810 Age-related osteoporosis without current pathological fracture: Secondary | ICD-10-CM | POA: Diagnosis not present

## 2020-06-27 DIAGNOSIS — F322 Major depressive disorder, single episode, severe without psychotic features: Secondary | ICD-10-CM | POA: Diagnosis not present

## 2020-06-27 DIAGNOSIS — G4700 Insomnia, unspecified: Secondary | ICD-10-CM | POA: Diagnosis not present

## 2020-06-27 DIAGNOSIS — E782 Mixed hyperlipidemia: Secondary | ICD-10-CM | POA: Diagnosis not present

## 2020-06-27 DIAGNOSIS — C3491 Malignant neoplasm of unspecified part of right bronchus or lung: Secondary | ICD-10-CM | POA: Diagnosis not present

## 2020-06-27 DIAGNOSIS — E7800 Pure hypercholesterolemia, unspecified: Secondary | ICD-10-CM | POA: Diagnosis not present

## 2020-06-27 DIAGNOSIS — F325 Major depressive disorder, single episode, in full remission: Secondary | ICD-10-CM | POA: Diagnosis not present

## 2020-06-27 DIAGNOSIS — J449 Chronic obstructive pulmonary disease, unspecified: Secondary | ICD-10-CM | POA: Diagnosis not present

## 2020-06-28 DIAGNOSIS — S81802A Unspecified open wound, left lower leg, initial encounter: Secondary | ICD-10-CM | POA: Diagnosis not present

## 2020-06-28 DIAGNOSIS — L0390 Cellulitis, unspecified: Secondary | ICD-10-CM | POA: Diagnosis not present

## 2020-07-04 DIAGNOSIS — S81802A Unspecified open wound, left lower leg, initial encounter: Secondary | ICD-10-CM | POA: Diagnosis not present

## 2020-07-04 DIAGNOSIS — L0390 Cellulitis, unspecified: Secondary | ICD-10-CM | POA: Diagnosis not present

## 2020-07-08 DIAGNOSIS — L03116 Cellulitis of left lower limb: Secondary | ICD-10-CM | POA: Diagnosis not present

## 2020-07-08 DIAGNOSIS — S81802D Unspecified open wound, left lower leg, subsequent encounter: Secondary | ICD-10-CM | POA: Diagnosis not present

## 2020-07-08 DIAGNOSIS — Z4800 Encounter for change or removal of nonsurgical wound dressing: Secondary | ICD-10-CM | POA: Diagnosis not present

## 2020-07-08 DIAGNOSIS — J449 Chronic obstructive pulmonary disease, unspecified: Secondary | ICD-10-CM | POA: Diagnosis not present

## 2020-07-08 DIAGNOSIS — M4800 Spinal stenosis, site unspecified: Secondary | ICD-10-CM | POA: Diagnosis not present

## 2020-07-08 DIAGNOSIS — Z87891 Personal history of nicotine dependence: Secondary | ICD-10-CM | POA: Diagnosis not present

## 2020-07-08 DIAGNOSIS — N183 Chronic kidney disease, stage 3 unspecified: Secondary | ICD-10-CM | POA: Diagnosis not present

## 2020-07-08 DIAGNOSIS — Z8781 Personal history of (healed) traumatic fracture: Secondary | ICD-10-CM | POA: Diagnosis not present

## 2020-07-08 DIAGNOSIS — M810 Age-related osteoporosis without current pathological fracture: Secondary | ICD-10-CM | POA: Diagnosis not present

## 2020-07-08 DIAGNOSIS — K219 Gastro-esophageal reflux disease without esophagitis: Secondary | ICD-10-CM | POA: Diagnosis not present

## 2020-07-08 DIAGNOSIS — E559 Vitamin D deficiency, unspecified: Secondary | ICD-10-CM | POA: Diagnosis not present

## 2020-07-08 DIAGNOSIS — Z902 Acquired absence of lung [part of]: Secondary | ICD-10-CM | POA: Diagnosis not present

## 2020-07-08 DIAGNOSIS — E785 Hyperlipidemia, unspecified: Secondary | ICD-10-CM | POA: Diagnosis not present

## 2020-07-08 DIAGNOSIS — M7542 Impingement syndrome of left shoulder: Secondary | ICD-10-CM | POA: Diagnosis not present

## 2020-07-08 DIAGNOSIS — I129 Hypertensive chronic kidney disease with stage 1 through stage 4 chronic kidney disease, or unspecified chronic kidney disease: Secondary | ICD-10-CM | POA: Diagnosis not present

## 2020-07-08 DIAGNOSIS — M8580 Other specified disorders of bone density and structure, unspecified site: Secondary | ICD-10-CM | POA: Diagnosis not present

## 2020-07-08 DIAGNOSIS — M1990 Unspecified osteoarthritis, unspecified site: Secondary | ICD-10-CM | POA: Diagnosis not present

## 2020-07-08 DIAGNOSIS — M5030 Other cervical disc degeneration, unspecified cervical region: Secondary | ICD-10-CM | POA: Diagnosis not present

## 2020-07-08 DIAGNOSIS — Z85118 Personal history of other malignant neoplasm of bronchus and lung: Secondary | ICD-10-CM | POA: Diagnosis not present

## 2020-07-08 DIAGNOSIS — I058 Other rheumatic mitral valve diseases: Secondary | ICD-10-CM | POA: Diagnosis not present

## 2020-07-08 DIAGNOSIS — Z9181 History of falling: Secondary | ICD-10-CM | POA: Diagnosis not present

## 2020-07-08 DIAGNOSIS — G4700 Insomnia, unspecified: Secondary | ICD-10-CM | POA: Diagnosis not present

## 2020-07-08 DIAGNOSIS — Z79891 Long term (current) use of opiate analgesic: Secondary | ICD-10-CM | POA: Diagnosis not present

## 2020-07-08 DIAGNOSIS — S81802A Unspecified open wound, left lower leg, initial encounter: Secondary | ICD-10-CM | POA: Diagnosis not present

## 2020-07-08 DIAGNOSIS — J309 Allergic rhinitis, unspecified: Secondary | ICD-10-CM | POA: Diagnosis not present

## 2020-07-14 DIAGNOSIS — S81802D Unspecified open wound, left lower leg, subsequent encounter: Secondary | ICD-10-CM | POA: Diagnosis not present

## 2020-07-14 DIAGNOSIS — E785 Hyperlipidemia, unspecified: Secondary | ICD-10-CM | POA: Diagnosis not present

## 2020-07-14 DIAGNOSIS — L03116 Cellulitis of left lower limb: Secondary | ICD-10-CM | POA: Diagnosis not present

## 2020-07-14 DIAGNOSIS — N183 Chronic kidney disease, stage 3 unspecified: Secondary | ICD-10-CM | POA: Diagnosis not present

## 2020-07-14 DIAGNOSIS — J449 Chronic obstructive pulmonary disease, unspecified: Secondary | ICD-10-CM | POA: Diagnosis not present

## 2020-07-14 DIAGNOSIS — I129 Hypertensive chronic kidney disease with stage 1 through stage 4 chronic kidney disease, or unspecified chronic kidney disease: Secondary | ICD-10-CM | POA: Diagnosis not present

## 2020-07-18 DIAGNOSIS — L03116 Cellulitis of left lower limb: Secondary | ICD-10-CM | POA: Diagnosis not present

## 2020-07-18 DIAGNOSIS — J449 Chronic obstructive pulmonary disease, unspecified: Secondary | ICD-10-CM | POA: Diagnosis not present

## 2020-07-18 DIAGNOSIS — N183 Chronic kidney disease, stage 3 unspecified: Secondary | ICD-10-CM | POA: Diagnosis not present

## 2020-07-18 DIAGNOSIS — E785 Hyperlipidemia, unspecified: Secondary | ICD-10-CM | POA: Diagnosis not present

## 2020-07-18 DIAGNOSIS — I129 Hypertensive chronic kidney disease with stage 1 through stage 4 chronic kidney disease, or unspecified chronic kidney disease: Secondary | ICD-10-CM | POA: Diagnosis not present

## 2020-07-18 DIAGNOSIS — S81802D Unspecified open wound, left lower leg, subsequent encounter: Secondary | ICD-10-CM | POA: Diagnosis not present

## 2020-07-20 DIAGNOSIS — N183 Chronic kidney disease, stage 3 unspecified: Secondary | ICD-10-CM | POA: Diagnosis not present

## 2020-07-20 DIAGNOSIS — J449 Chronic obstructive pulmonary disease, unspecified: Secondary | ICD-10-CM | POA: Diagnosis not present

## 2020-07-20 DIAGNOSIS — E785 Hyperlipidemia, unspecified: Secondary | ICD-10-CM | POA: Diagnosis not present

## 2020-07-20 DIAGNOSIS — L03116 Cellulitis of left lower limb: Secondary | ICD-10-CM | POA: Diagnosis not present

## 2020-07-20 DIAGNOSIS — S81802D Unspecified open wound, left lower leg, subsequent encounter: Secondary | ICD-10-CM | POA: Diagnosis not present

## 2020-07-20 DIAGNOSIS — I129 Hypertensive chronic kidney disease with stage 1 through stage 4 chronic kidney disease, or unspecified chronic kidney disease: Secondary | ICD-10-CM | POA: Diagnosis not present

## 2020-07-24 DIAGNOSIS — N183 Chronic kidney disease, stage 3 unspecified: Secondary | ICD-10-CM | POA: Diagnosis not present

## 2020-07-24 DIAGNOSIS — J449 Chronic obstructive pulmonary disease, unspecified: Secondary | ICD-10-CM | POA: Diagnosis not present

## 2020-07-24 DIAGNOSIS — S81802D Unspecified open wound, left lower leg, subsequent encounter: Secondary | ICD-10-CM | POA: Diagnosis not present

## 2020-07-24 DIAGNOSIS — I129 Hypertensive chronic kidney disease with stage 1 through stage 4 chronic kidney disease, or unspecified chronic kidney disease: Secondary | ICD-10-CM | POA: Diagnosis not present

## 2020-07-24 DIAGNOSIS — E785 Hyperlipidemia, unspecified: Secondary | ICD-10-CM | POA: Diagnosis not present

## 2020-07-24 DIAGNOSIS — L03116 Cellulitis of left lower limb: Secondary | ICD-10-CM | POA: Diagnosis not present

## 2020-07-27 DIAGNOSIS — I129 Hypertensive chronic kidney disease with stage 1 through stage 4 chronic kidney disease, or unspecified chronic kidney disease: Secondary | ICD-10-CM | POA: Diagnosis not present

## 2020-07-27 DIAGNOSIS — N183 Chronic kidney disease, stage 3 unspecified: Secondary | ICD-10-CM | POA: Diagnosis not present

## 2020-07-27 DIAGNOSIS — S81802D Unspecified open wound, left lower leg, subsequent encounter: Secondary | ICD-10-CM | POA: Diagnosis not present

## 2020-07-27 DIAGNOSIS — L03116 Cellulitis of left lower limb: Secondary | ICD-10-CM | POA: Diagnosis not present

## 2020-07-27 DIAGNOSIS — E785 Hyperlipidemia, unspecified: Secondary | ICD-10-CM | POA: Diagnosis not present

## 2020-07-27 DIAGNOSIS — J449 Chronic obstructive pulmonary disease, unspecified: Secondary | ICD-10-CM | POA: Diagnosis not present

## 2020-07-31 DIAGNOSIS — J449 Chronic obstructive pulmonary disease, unspecified: Secondary | ICD-10-CM | POA: Diagnosis not present

## 2020-07-31 DIAGNOSIS — L03116 Cellulitis of left lower limb: Secondary | ICD-10-CM | POA: Diagnosis not present

## 2020-07-31 DIAGNOSIS — S81802D Unspecified open wound, left lower leg, subsequent encounter: Secondary | ICD-10-CM | POA: Diagnosis not present

## 2020-07-31 DIAGNOSIS — N183 Chronic kidney disease, stage 3 unspecified: Secondary | ICD-10-CM | POA: Diagnosis not present

## 2020-07-31 DIAGNOSIS — E785 Hyperlipidemia, unspecified: Secondary | ICD-10-CM | POA: Diagnosis not present

## 2020-07-31 DIAGNOSIS — I129 Hypertensive chronic kidney disease with stage 1 through stage 4 chronic kidney disease, or unspecified chronic kidney disease: Secondary | ICD-10-CM | POA: Diagnosis not present

## 2020-08-03 ENCOUNTER — Encounter (HOSPITAL_BASED_OUTPATIENT_CLINIC_OR_DEPARTMENT_OTHER): Payer: Medicare Other | Attending: Internal Medicine | Admitting: Internal Medicine

## 2020-08-03 ENCOUNTER — Other Ambulatory Visit: Payer: Self-pay

## 2020-08-03 DIAGNOSIS — I872 Venous insufficiency (chronic) (peripheral): Secondary | ICD-10-CM | POA: Diagnosis not present

## 2020-08-03 DIAGNOSIS — I10 Essential (primary) hypertension: Secondary | ICD-10-CM | POA: Insufficient documentation

## 2020-08-03 DIAGNOSIS — J449 Chronic obstructive pulmonary disease, unspecified: Secondary | ICD-10-CM | POA: Diagnosis not present

## 2020-08-03 DIAGNOSIS — I87332 Chronic venous hypertension (idiopathic) with ulcer and inflammation of left lower extremity: Secondary | ICD-10-CM | POA: Insufficient documentation

## 2020-08-03 DIAGNOSIS — L97222 Non-pressure chronic ulcer of left 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: Secondary | ICD-10-CM | POA: Diagnosis not present

## 2020-08-03 DIAGNOSIS — L97822 Non-pressure chronic ulcer of other part of left lower leg with fat layer exposed: Secondary | ICD-10-CM | POA: Diagnosis not present

## 2020-08-03 NOTE — Progress Notes (Signed)
LEMON, WHITACRE ([id number]) Visit Report for 08/03/2020 Abuse/Suicide Risk Screen Details Patient Name: Date of Service: [name] [name]. 08/03/2020 2:45 PM Medical Record Number: [id number] Patient Account Number: [id number] Date of Birth/Sex: Treating RN: [birth year] (86 y.o. [name] [name] Primary Care [name] [name]: [name] [name] Other Clinician: Referring [name] [name]: Treating [name] [name]/Extender: [name] [name] in Treatment: 0 Abuse/Suicide Risk Screen Items Answer ABUSE RISK SCREEN: Has anyone close to you tried to hurt or harm you recentlyo No Do you feel uncomfortable with anyone in your familyo No Has anyone forced you do things that you didnt want to doo No Electronic Signature(s) Signed: 08/03/2020 5:04:32 PM By: [name] [name] Entered By: [name] [name] on 08/03/2020 15:11:53 -------------------------------------------------------------------------------- Activities of Daily Living Details Patient Name: Date of Service: [name] [name] 08/03/2020 2:45 PM Medical Record Number: [id number] Patient Account Number: [id number] Date of Birth/Sex: Treating RN: 08/03/1927 (86 y.o. [name] [name] Primary Care [name] [name]: [name] [name] Other Clinician: Referring [name] [name]: Treating [name] [name]/Extender: [name] [name] in Treatment: 0 Activities of Daily Living Items Answer Activities of Daily Living (Please select one for each item) Drive Automobile Not Able T Medications ake Completely Able Use T elephone Completely Able Care for Appearance Completely Able Use T oilet Completely Able Bath / Shower Completely Able Dress Self Completely Able Feed Self Completely Able Walk Completely Able Get In / Out Bed Completely Able Housework Completely [place] Need Assistance Shop for Self Need Assistance Electronic Signature(s) Signed: 08/03/2020 5:04:32 PM By: [name] [name] Entered By: [name] [name] on 08/03/2020 15:12:34 -------------------------------------------------------------------------------- Education Screening Details Patient Name: Date of Service: [name] [name]. 08/03/2020 2:45 PM Medical Record Number: [id number] Patient Account Number: [id number] Date of Birth/Sex: Treating RN: [birth year] (86 y.o. [name] [name] Primary Care [name] [name]: [name] [name] Other Clinician: Referring [name] [name]: Treating [name] [name]/Extender: [name] [name] in Treatment: 0 Primary Learner Assessed: Patient Learning Preferences/Education Level/Primary Language Learning Preference: Explanation Preferred Language: English Cognitive Barrier Language Barrier: No Translator Needed: No Memory Deficit: No Emotional Barrier: No Cultural/Religious Beliefs Affecting Medical Care: No Physical Barrier Impaired Vision: No Impaired Hearing: No Decreased Hand dexterity: No Knowledge/Comprehension Knowledge Level: Medium Comprehension Level: Medium Ability to understand written instructions: Medium Ability to understand verbal instructions: Medium Motivation Anxiety Level: Calm Cooperation: Cooperative Education Importance: Acknowledges Need Interest in Health Problems: Asks Questions Perception: Coherent Willingness to Engage in Self-Management High Activities: Readiness to Engage in Self-Management High Activities: Electronic Signature(s) Signed: 08/03/2020 5:04:32 PM By: [name] [name] Entered By: [name] [name] on 08/03/2020 15:12:56 -------------------------------------------------------------------------------- Fall Risk Assessment Details Patient Name: Date of Service: [name] [name]. 08/03/2020 2:45 PM Medical Record Number: [id number] Patient Account Number: [id number] Date of Birth/Sex: Treating RN: Mar 30, 1927 (86 y.o. [name] [name] Primary Care [name] [name]: [name] [name] Other  Clinician: Referring [name] [name]: Treating [name] [name]/Extender: [name] [name] in Treatment: 0 Fall Risk Assessment Items Have you had 2 or more falls in the last 12 monthso 0 No Have you had any fall that resulted in injury in the last 12 monthso 0 No FALLS RISK SCREEN History of falling - immediate or within 3 months 0 No Secondary diagnosis (Do you have 2 or more medical diagnoseso) 0 No Ambulatory aid None/bed rest/wheelchair/nurse 0 Yes Crutches/cane/walker 0 No Furniture 0 No Intravenous therapy Access/Saline/Heparin Lock 0 No Gait/Transferring Normal/ bed rest/ wheelchair 0 Yes Weak (short steps with or without shuffle, stooped but able to lift head while  walking, may seek 0 No support from furniture) Impaired (short steps with shuffle, may have difficulty arising from chair, head down, impaired 0 No balance) Mental Status Oriented to own ability 0 Yes Electronic Signature(s) Signed: 08/03/2020 5:04:32 PM By: [name] [name] Entered By: [name] [name] on 08/03/2020 15:13:23 -------------------------------------------------------------------------------- Foot Assessment Details Patient Name: Date of Service: [name] [name]. 08/03/2020 2:45 PM Medical Record Number: [id number] Patient Account Number: [id number] Date of Birth/Sex: Treating RN: 05/19/1927 (86 y.o. [name] [name] Primary Care [name] [name]: [name] [name] Other Clinician: Referring [name] [name]: Treating [name] [name]/Extender: [name] [name] in Treatment: 0 Foot Assessment Items Site Locations + = Sensation present, - = Sensation absent, C = Callus, U = Ulcer R = Redness, W = Warmth, M = Maceration, PU = Pre-ulcerative lesion F = Fissure, S = Swelling, D = Dryness Assessment Right: Left: Other Deformity: No No Prior Foot Ulcer: No No Prior Amputation: No No Charcot Joint: No No Ambulatory Status: Ambulatory With Help Assistance Device: Wheelchair Gait:  Buyer, retail Signature(s) Signed: 08/03/2020 5:04:32 PM By: [name] [name] Entered By: [name] [name] on 08/03/2020 15:13:46 -------------------------------------------------------------------------------- Nutrition Risk Screening Details Patient Name: Date of Service: [name] [name] 08/03/2020 2:45 PM Medical Record Number: [id number] Patient Account Number: [id number] Date of Birth/Sex: Treating RN: 01/18/1927 (86 y.o. [name] [name] Primary Care Westyn Driggers: [name] [name] Other Clinician: Referring Lesbia Ottaway: Treating Alson Mcpheeters/Extender: Lajean Silvius, Fransisca Connors in Treatment: 0 Height (in): 61 Weight (lbs): 155 Body Mass Index (BMI): 29.3 Nutrition Risk Screening Items Score Screening NUTRITION RISK SCREEN: I have an illness or condition that made me change the kind and/or amount of food I eat 0 No I eat fewer than two meals per day 0 No I eat few fruits and vegetables, or milk products 0 No I have three or more drinks of beer, liquor or wine almost every day 0 No I have tooth or mouth problems that make it hard for me to eat 0 No I don't always have enough money to buy the food I need 0 No I eat alone most of the time 0 No I take three or more different prescribed or over-the-counter drugs a day 1 Yes Without wanting to, I have lost or gained 10 pounds in the last six months 0 No I am not always physically able to shop, cook and/or feed myself 0 No Nutrition Protocols Good Risk Protocol 0 No interventions needed Moderate Risk Protocol High Risk Proctocol Risk Level: Good Risk Score: 1 Electronic Signature(s) Signed: 08/03/2020 5:04:32 PM By: [name] [name] Entered By: [name] [name] on 08/03/2020 15:13:34

## 2020-08-04 NOTE — Progress Notes (Signed)
[name] [name] ([id number]) Visit Report for 08/03/2020 Allergy List Details Patient Name: Date of Service: [name] [name]. 08/03/2020 2:45 PM Medical Record Number: [id number] Patient Account Number: [id number] Date of Birth/Sex: Treating RN: 01/03/1927 (86 y.o. [name] Primary Care Colton Tassin: Wenda Low Other Clinician: Referring Charles Niese: Treating Anagabriela Jokerst/Extender: Lajean Silvius, Fransisca Connors in Treatment: 0 Allergies Active Allergies penicillin G benzathine sulfamethoxazole codeine sulfate aspirin Allergy Notes Electronic Signature(s) Signed: 08/03/2020 5:04:32 PM By: Kela Millin Entered By: Kela Millin on 08/03/2020 15:06:33 -------------------------------------------------------------------------------- Arrival Information Details Patient Name: Date of Service: [name] [name]. 08/03/2020 2:45 PM Medical Record Number: [id number] Patient Account Number: [id number] Date of Birth/Sex: Treating RN: [birth year] (86 y.o. [name] Primary Care Emeric Novinger: Wenda Low Other Clinician: Referring Laylanie Kruczek: Treating Floreine Kingdon/Extender: Bryon Lions in Treatment: 0 Visit Information Patient Arrived: Wheel Chair Arrival Time: 15:03 Accompanied By: daughter Transfer Assistance: None Patient Identification Verified: Yes Secondary Verification Process Completed: Yes Electronic Signature(s) Signed: 08/03/2020 5:04:32 PM By: Kela Millin Entered By: Kela Millin on 08/03/2020 15:04:48 -------------------------------------------------------------------------------- Clinic Level of Care Assessment Details Patient Name: Date of Service: [name] [name] 08/03/2020 2:45 PM Medical Record Number: [id number] Patient Account Number: [id number] Date of Birth/Sex: Treating RN: [birth year] (86 y.o. [name] Primary Care Olean Sangster: Wenda Low Other Clinician: Referring Coburn Knaus: Treating  Deaja Rizo/Extender: Bryon Lions in Treatment: 0 Clinic Level of Care Assessment Items TOOL 1 Quantity Score X- 1 0 Use when EandM and Procedure is performed on INITIAL visit ASSESSMENTS - Nursing Assessment / Reassessment X- 1 20 General Physical Exam (combine w/ comprehensive assessment (listed just below) when performed on new pt. evals) X- 1 25 Comprehensive Assessment (HX, ROS, Risk Assessments, Wounds Hx, etc.) ASSESSMENTS - Wound and Skin Assessment / Reassessment X- 1 10 Dermatologic / Skin Assessment (not related to wound area) ASSESSMENTS - Ostomy and/or Continence Assessment and Care []  - 0 Incontinence Assessment and Management []  - 0 Ostomy Care Assessment and Management (repouching, etc.) PROCESS - Coordination of Care X - Simple Patient / Family Education for ongoing care 1 15 []  - 0 Complex (extensive) Patient / Family Education for ongoing care X- 1 10 Staff obtains Programmer, systems, Records, T Results / Process Orders est X- 1 10 Staff telephones HHA, Nursing Homes / Clarify orders / etc []  - 0 Routine Transfer to another Facility (non-emergent condition) []  - 0 Routine Hospital Admission (non-emergent condition) X- 1 15 New Admissions / Biomedical engineer / Ordering NPWT Apligraf, etc. , []  - 0 Emergency Hospital Admission (emergent condition) PROCESS - Special Needs []  - 0 Pediatric / Minor Patient Management []  - 0 Isolation Patient Management []  - 0 Hearing / Language / Visual special needs []  - 0 Assessment of Community assistance (transportation, D/C planning, etc.) []  - 0 Additional assistance / Altered mentation []  - 0 Support Surface(s) Assessment (bed, cushion, seat, etc.) INTERVENTIONS - Miscellaneous []  - 0 External ear exam []  - 0 Patient Transfer (multiple staff / Civil Service fast streamer / Similar devices) []  - 0 Simple Staple / Suture removal (25 or less) []  - 0 Complex Staple / Suture removal (26 or more) []  -  0 Hypo/Hyperglycemic Management (do not check if billed separately) X- 1 15 Ankle / Brachial Index (ABI) - do not check if billed separately Has the patient been seen at the hospital within the last three years: Yes Total Score: 120 Level Of Care: New/Established - Level 4 Electronic Signature(s) Signed: 08/03/2020  5:01:36 PM By: Deon Pilling Signed: 08/03/2020 5:01:36 PM By: Deon Pilling Entered By: Deon Pilling on 08/03/2020 16:56:20 -------------------------------------------------------------------------------- Compression Therapy Details Patient Name: Date of Service: [name] [name]. 08/03/2020 2:45 PM Medical Record Number: [id number] Patient Account Number: [id number] Date of Birth/Sex: Treating RN: 1927/01/14 (86 y.o. [name] Primary Care Anneta Rounds: Wenda Low Other Clinician: Referring Zonya Gudger: Treating Zniyah Midkiff/Extender: Bryon Lions in Treatment: 0 Compression Therapy Performed for Wound Assessment: Wound #1 Left,Medial Lower Leg Performed By: Clinician Baruch Gouty, RN Compression Type: Three Layer Pre Treatment ABI: 1.2 Post Procedure Diagnosis Same as Pre-procedure Electronic Signature(s) Signed: 08/03/2020 5:01:36 PM By: Deon Pilling Entered By: Deon Pilling on 08/03/2020 15:58:02 -------------------------------------------------------------------------------- Encounter Discharge Information Details Patient Name: Date of Service: [name] [name]. 08/03/2020 2:45 PM Medical Record Number: [id number] Patient Account Number: [id number] Date of Birth/Sex: Treating RN: 05/26/1927 (86 y.o. Orvan Falconer Primary Care Joycie Aerts: Wenda Low Other Clinician: Referring Nyiesha Beever: Treating Drury Ardizzone/Extender: Bryon Lions in Treatment: 0 Encounter Discharge Information Items Post Procedure Vitals Discharge Condition: Stable Temperature (F): 98.6 Ambulatory Status: Ambulatory Pulse (bpm):  70 Discharge Destination: Home Respiratory Rate (breaths/min): 18 Transportation: Private Auto Blood Pressure (mmHg): 123/70 Accompanied By: self Schedule Follow-up Appointment: Yes Clinical Summary of Care: Patient Declined Electronic Signature(s) Signed: 08/04/2020 4:20:16 PM By: Carlene Coria RN Entered By: Carlene Coria on 08/03/2020 16:44:54 -------------------------------------------------------------------------------- Lower Extremity Assessment Details Patient Name: Date of Service: [name] [name]. 08/03/2020 2:45 PM Medical Record Number: [id number] Patient Account Number: [id number] Date of Birth/Sex: Treating RN: 01/29/1927 (86 y.o. [name] Primary Care Generoso Cropper: Wenda Low Other Clinician: Referring Fidela Cieslak: Treating Jermar Colter/Extender: Bryon Lions in Treatment: 0 Edema Assessment Assessed: Shirlyn Goltz: No] Patrice Paradise: No] E[Left: dema] [Right: :] Calf Left: Right: Point of Measurement: From Medial Instep 35.5 cm Ankle Left: Right: Point of Measurement: From Medial Instep 23 cm Vascular Assessment Pulses: Dorsalis Pedis Palpable: [Left:Yes] Blood Pressure: Brachial: [Left:123] Ankle: [Left:Dorsalis Pedis: 146 1.19] Electronic Signature(s) Signed: 08/03/2020 5:04:32 PM By: Kela Millin Entered By: Kela Millin on 08/03/2020 15:28:40 -------------------------------------------------------------------------------- Multi Wound Chart Details Patient Name: Date of Service: [name] [name]. 08/03/2020 2:45 PM Medical Record Number: [id number] Patient Account Number: [id number] Date of Birth/Sex: Treating RN: [birth year] (86 y.o. [name] Primary Care Ranesha Val: Wenda Low Other Clinician: Referring Marrie Chandra: Treating Kaysia Willard/Extender: Lajean Silvius, Fransisca Connors in Treatment: 0 Vital Signs Height(in): 31 Pulse(bpm): 50 Weight(lbs): 47 Blood Pressure(mmHg): 123/70 Body Mass Index(BMI):  29 Temperature(F): 98.6 Respiratory Rate(breaths/min): 20 Photos: [1:No Photos Left, Medial Lower Leg] [N/A:N/A N/A] Wound Location: [1:Blister] [N/A:N/A] Wounding Event: [1:Venous Leg Ulcer] [N/A:N/A] Primary Etiology: [1:Asthma, Chronic Obstructive] [N/A:N/A] Comorbid History: [1:Pulmonary Disease (COPD), Hypertension, Received Chemotherapy 06/19/2020] [N/A:N/A] Date Acquired: [1:0] [N/A:N/A] Weeks of Treatment: [1:Open] [N/A:N/A] Wound Status: [1:6.7x4.5x0.1] [N/A:N/A] Measurements L x W x D (cm) [1:23.68] [N/A:N/A] A (cm) : rea [1:2.368] [N/A:N/A] Volume (cm) : [1:Full Thickness Without Exposed] [N/A:N/A] Classification: [1:Support Structures Medium] [N/A:N/A] Exudate Amount: [1:Serous] [N/A:N/A] Exudate Type: [1:amber] [N/A:N/A] Exudate Color: [1:Distinct, outline attached] [N/A:N/A] Wound Margin: [1:Medium (34-66%)] [N/A:N/A] Granulation Amount: [1:Red, Pink] [N/A:N/A] Granulation Quality: [1:Medium (34-66%)] [N/A:N/A] Necrotic Amount: [1:Fat Layer (Subcutaneous Tissue): Yes N/A] Exposed Structures: [1:Fascia: No Tendon: No Muscle: No Joint: No Bone: No None] [N/A:N/A] Epithelialization: [1:Debridement - Excisional] [N/A:N/A] Debridement: Pre-procedure Verification/Time Out 15:50 [N/A:N/A] Taken: [1:Lidocaine 4% Topical Solution] [N/A:N/A] Pain Control: [1:Subcutaneous, Slough] [N/A:N/A] Tissue Debrided: [1:Skin/Subcutaneous Tissue] [N/A:N/A] Level: [1:30.15] [N/A:N/A] Debridement A (sq cm): [  1:rea Curette] [N/A:N/A] Instrument: [1:Minimum] [N/A:N/A] Bleeding: [1:Pressure] [N/A:N/A] Hemostasis A chieved: [1:1] [N/A:N/A] Procedural Pain: [1:4] [N/A:N/A] Post Procedural Pain: [1:Procedure was tolerated well] [N/A:N/A] Debridement Treatment Response: [1:6.7x4.5x0.2] [N/A:N/A] Post Debridement Measurements L x W x D (cm) [1:4.736] [N/A:N/A] Post Debridement Volume: (cm) [1:Compression Therapy] [N/A:N/A] Procedures Performed: [1:Debridement] Treatment  Notes Electronic Signature(s) Signed: 08/03/2020 4:51:06 PM By: Linton Ham MD Signed: 08/03/2020 5:01:36 PM By: Deon Pilling Entered By: Linton Ham on 08/03/2020 16:26:03 -------------------------------------------------------------------------------- Multi-Disciplinary Care Plan Details Patient Name: Date of Service: [name] 08/03/2020 2:45 PM Medical Record Number: [id number] Patient Account Number: [id number] Date of Birth/Sex: Treating RN: 1927/08/30 (86 y.o. [name], Tammi Klippel Primary Care Lyndzie Zentz: Wenda Low Other Clinician: Referring Lalitha Ilyas: Treating Afra Tricarico/Extender: Bryon Lions in Treatment: 0 Active Inactive Orientation to the Wound Care Program Nursing Diagnoses: Knowledge deficit related to the wound healing center program Goals: Patient/caregiver will verbalize understanding of the [place] Date Initiated: 08/03/2020 Target Resolution Date: 08/11/2020 Goal Status: Active Interventions: Provide education on orientation to the wound center Notes: Wound/Skin Impairment Nursing Diagnoses: Knowledge deficit related to ulceration/compromised skin integrity Goals: Patient/caregiver will verbalize understanding of skin care regimen Date Initiated: 08/03/2020 Target Resolution Date: 08/11/2020 Goal Status: Active Interventions: Assess patient/caregiver ability to perform ulcer/skin care regimen upon admission and as needed Provide education on ulcer and skin care Treatment Activities: Skin care regimen initiated : 08/03/2020 Topical wound management initiated : 08/03/2020 Notes: Electronic Signature(s) Signed: 08/03/2020 5:01:36 PM By: Deon Pilling Entered By: Deon Pilling on 08/03/2020 16:53:59 -------------------------------------------------------------------------------- Patient/Caregiver Education Details Patient Name: Date of Service: [name] [name] 9/30/2021andnbsp2:45 PM Medical  Record Number: [id number] Patient Account Number: [id number] Date of Birth/Gender: Treating RN: 04/30/1927 (86 y.o. [name] Primary Care Physician: Wenda Low Other Clinician: Referring Physician: Treating Physician/Extender: Bryon Lions in Treatment: 0 Education Assessment Education Provided To: Patient and Caregiver Education Topics Provided Welcome T [facility] [place]: o Handouts: Welcome T [facility] [place] o Methods: Explain/Verbal, Printed Responses: Reinforcements needed Electronic Signature(s) Signed: 08/03/2020 5:01:36 PM By: Deon Pilling Entered By: Deon Pilling on 08/03/2020 16:54:09 -------------------------------------------------------------------------------- Wound Assessment Details Patient Name: Date of Service: [name] [name]. 08/03/2020 2:45 PM Medical Record Number: [id number] Patient Account Number: [id number] Date of Birth/Sex: Treating RN: 09/03/1927 (86 y.o. [name] Primary Care Danen Lapaglia: Wenda Low Other Clinician: Referring Micha Dosanjh: Treating Kodee Drury/Extender: Bryon Lions in Treatment: 0 Wound Status Wound Number: 1 Primary Venous Leg Ulcer Etiology: Wound Location: Left, Medial Lower Leg Wound Open Wounding Event: Blister Status: Date Acquired: 06/19/2020 Comorbid Asthma, Chronic Obstructive Pulmonary Disease (COPD), Weeks Of Treatment: 0 History: Hypertension, Received Chemotherapy Clustered Wound: No Wound Measurements Length: (cm) 6.7 Width: (cm) 4.5 Depth: (cm) 0.1 Area: (cm) 23.68 Volume: (cm) 2.368 % Reduction in Area: % Reduction in Volume: Epithelialization: None Tunneling: No Undermining: No Wound Description Classification: Full Thickness Without Exposed Support Str Wound Margin: Distinct, outline attached Exudate Amount: Medium Exudate Type: Serous Exudate Color: amber uctures Foul Odor After Cleansing: No Slough/Fibrino  Yes Wound Bed Granulation Amount: Medium (34-66%) Exposed Structure Granulation Quality: Red, Pink Fascia Exposed: No Necrotic Amount: Medium (34-66%) Fat Layer (Subcutaneous Tissue) Exposed: Yes Necrotic Quality: Adherent Slough Tendon Exposed: No Muscle Exposed: No Joint Exposed: No Bone Exposed: No Treatment Notes Wound #1 (Left, Medial Lower Leg) 1. Cleanse With Wound Cleanser Soap and water 3. Primary Dressing Applied Iodoflex 4. Secondary Dressing ABD Pad Dry Gauze  6. Support Layer Applied 3 layer compression wrap Notes netting Electronic Signature(s) Signed: 08/03/2020 5:04:32 PM By: Kela Millin Entered By: Kela Millin on 08/03/2020 15:32:46 -------------------------------------------------------------------------------- Vitals Details Patient Name: Date of Service: [name], [name] 08/03/2020 2:45 PM Medical Record Number: [id number] Patient Account Number: [id number] Date of Birth/Sex: Treating RN: 10/02/1927 (86 y.o. [name] Primary Care Gohan Collister: Wenda Low Other Clinician: Referring Rajveer Handler: Treating Desiree Daise/Extender: Bryon Lions in Treatment: 0 Vital Signs Time Taken: 15:00 Temperature (F): 98.6 Height (in): 61 Pulse (bpm): 70 Source: Stated Respiratory Rate (breaths/min): 20 Weight (lbs): 155 Blood Pressure (mmHg): 123/70 Source: Stated Reference Range: 80 - 120 mg / dl Body Mass Index (BMI): 29.3 Electronic Signature(s) Signed: 08/03/2020 5:04:32 PM By: Kela Millin Entered By: Kela Millin on 08/03/2020 15:05:32

## 2020-08-04 NOTE — Progress Notes (Signed)
HARMONEY, SIENKIEWICZ ([id number]) Visit Report for 08/03/2020 Chief Complaint Document Details Patient Name: Date of Service: [name] [name]. 08/03/2020 2:45 PM Medical Record Number: [id number] Patient Account Number: [id number] Date of Birth/Sex: Treating RN: 10-16-1927 (86 y.o. [name] [name] Primary Care Provider: Wenda Low Other Clinician: Referring Provider: Treating Provider/Extender: Lajean Silvius, Fransisca Connors in Treatment: 0 Information Obtained from: Patient Chief Complaint 08/03/2020; patient is here for review of a wound on the left anterior mid tibia area trauma in the setting of chronic venous disease and poorly controlled edema Electronic Signature(s) Signed: 08/03/2020 4:51:06 PM By: Linton Ham MD Entered By: Linton Ham on 08/03/2020 16:26:42 -------------------------------------------------------------------------------- Debridement Details Patient Name: Date of Service: [name] [name]. 08/03/2020 2:45 PM Medical Record Number: [id number] Patient Account Number: [id number] Date of Birth/Sex: Treating RN: [birth year] (86 y.o. [name], [name] Primary Care Provider: Wenda Low Other Clinician: Referring Provider: Treating Provider/Extender: Bryon Lions in Treatment: 0 Debridement Performed for Assessment: Wound #1 Left,Medial Lower Leg Performed By: Physician Ricard Dillon., MD Debridement Type: Debridement Severity of Tissue Pre Debridement: Fat layer exposed Level of Consciousness (Pre-procedure): Awake and Alert Pre-procedure Verification/Time Out Yes - 15:50 Taken: Start Time: 15:51 Pain Control: Lidocaine 4% T opical Solution T Area Debrided (L x W): otal 6.7 (cm) x 4.5 (cm) = 30.15 (cm) Tissue and other material debrided: Viable, Non-Viable, Slough, Subcutaneous, Skin: Dermis , Fibrin/Exudate, Slough Level: Skin/Subcutaneous Tissue Debridement Description: Excisional Instrument:  Curette Bleeding: Minimum Hemostasis Achieved: Pressure End Time: 15:58 Procedural Pain: 1 Post Procedural Pain: 4 Response to Treatment: Procedure was tolerated well Level of Consciousness (Post- Awake and Alert procedure): Post Debridement Measurements of Total Wound Length: (cm) 6.7 Width: (cm) 4.5 Depth: (cm) 0.2 Volume: (cm) 4.736 Character of Wound/Ulcer Post Debridement: Requires Further Debridement Severity of Tissue Post Debridement: Fat layer exposed Post Procedure Diagnosis Same as Pre-procedure Electronic Signature(s) Signed: 08/03/2020 4:51:06 PM By: Linton Ham MD Signed: 08/03/2020 5:01:36 PM By: Deon Pilling Entered By: Linton Ham on 08/03/2020 16:26:14 -------------------------------------------------------------------------------- HPI Details Patient Name: Date of Service: [name] Starks M. 08/03/2020 2:45 PM Medical Record Number: [id number] Patient Account Number: [id number] Date of Birth/Sex: Treating RN: July 19, 1927 (86 y.o. [name] [name] Primary Care Provider: Wenda Low Other Clinician: Referring Provider: Treating Provider/Extender: Bryon Lions in Treatment: 0 History of Present Illness HPI Description: ADMISSION 08/03/2020 Patient is a pleasant 86 year old woman accompanied by her granddaughter. She lives on her own but I think there is extensive family support. About 8 weeks ago she was taking off her compression stocking and traumatized the anterior part of her lower extremity on the left. She has seen her primary physician and was initially prescribed Keflex and then a course of clindamycin. She has home health they are applying silver alginate ABDs and kerlix. Not making any progress with the wound and they are here for our review of this. Past medical history includes COPD, rosacea, history of lung cancer status post surgery on the right lung, hypertension, spinal stenosis, compression stocking use fairly  reliably. She has [facility] already ABI on the left in our clinic was 1.19 Electronic Signature(s) Signed: 08/03/2020 4:51:06 PM By: Linton Ham MD Entered By: Linton Ham on 08/03/2020 16:29:26 -------------------------------------------------------------------------------- Physical Exam Details Patient Name: Date of Service: [name] [name]. 08/03/2020 2:45 PM Medical Record Number: [id number] Patient Account Number: [id number] Date of Birth/Sex: Treating RN: Feb 06, 1927 (86 y.o. [name] [name] Primary Care  Provider: Wenda Low Other Clinician: Referring Provider: Treating Provider/Extender: Lajean Silvius, Fransisca Connors in Treatment: 0 Constitutional Sitting or standing Blood Pressure is within target range for patient.. Pulse regular and within target range for patient.Marland Kitchen Respirations regular, non-labored and within target range.. Temperature is normal and within the target range for the patient.Marland Kitchen Appears in no distress. Respiratory work of breathing is normal. Cardiovascular Pedal pulses are palpable on the left. Significant chronic edema in the left lower leg greater than the right. Very fragile skin in her bilateral lower extremities.. Integumentary (Hair, Skin) Skin changes of chronic venous insufficiency in the bilateral lower legs left greater than right. Notes Wound exam; fairly sizable wound on the left anterior lower leg 100% covered with a very adherent necrotic surface. I attempted to use an open curette to debride this first but subsequently switched to a #5 curette. Even with this I was not even able to get close to something that look like a healthy surface because of patient complaints of discomfort. Hemostasis with direct pressure. There was minimal bleeding. Electronic Signature(s) Signed: 08/03/2020 4:51:06 PM By: Linton Ham MD Entered By: Linton Ham on 08/03/2020  16:33:53 -------------------------------------------------------------------------------- Physician Orders Details Patient Name: Date of Service: [name] [name]. 08/03/2020 2:45 PM Medical Record Number: [id number] Patient Account Number: [id number] Date of Birth/Sex: Treating RN: July 18, 1927 (86 y.o. [name] [name] Primary Care Provider: Wenda Low Other Clinician: Referring Provider: Treating Provider/Extender: Bryon Lions in Treatment: 0 Verbal / Phone Orders: No Diagnosis Coding Follow-up Appointments Return Appointment in 1 week. Dressing Change Frequency Other: - twice a week. home health once a week. wound center weekly. Skin Barriers/Peri-Wound Care Moisturizing lotion - patient to lotion right leg daily. home health to apply lotion with dressing changes to left leg. TCA Cream or Ointment - mixed with lotion to left leg in clinic. Wound Cleansing May shower with protection. - use cast protector. May shower and wash wound with soap and water. - with dressing changes. Primary Wound Dressing Wound #1 Left,Medial Lower Leg Iodoflex - or iodosorb ointment. Secondary Dressing Dry Gauze ABD pad Edema Control 3 Layer Compression System - Left Lower Extremity Avoid standing for long periods of time Elevate legs to the level of the heart or above for 30 minutes daily and/or when sitting, a frequency of: - throughout the day 3 to 4 times a day. Exercise regularly Support Garment 20-30 mm/Hg pressure to: - wear stocking on right leg. apply in the morning and remove at night. [place] skilled nursing for wound care. - [facility]. Electronic Signature(s) Signed: 08/03/2020 4:51:06 PM By: Linton Ham MD Signed: 08/03/2020 5:01:36 PM By: Deon Pilling Entered By: Deon Pilling on 08/03/2020 15:57:31 -------------------------------------------------------------------------------- Problem List Details Patient  Name: Date of Service: [name] [name]. 08/03/2020 2:45 PM Medical Record Number: [id number] Patient Account Number: [id number] Date of Birth/Sex: Treating RN: August 25, 1927 (86 y.o. [name] [name] Primary Care Provider: Wenda Low Other Clinician: Referring Provider: Treating Provider/Extender: Bryon Lions in Treatment: 0 Active Problems ICD-10 Encounter Code Description Active Date MDM Diagnosis I87.332 Chronic venous hypertension (idiopathic) with ulcer and inflammation of left 08/03/2020 No Yes lower extremity L97.222 Non-pressure chronic ulcer of left 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 08/03/2020 No Yes Inactive Problems Resolved Problems Electronic Signature(s) Signed: 08/03/2020 4:51:06 PM By: Linton Ham MD Entered By: Linton Ham on 08/03/2020 16:12:33 -------------------------------------------------------------------------------- Progress Note Details Patient Name: Date of Service: [name] Humphrey, [name] RGIE  M. 08/03/2020 2:45 PM Medical Record Number: [id number] Patient Account Number: [id number] Date of Birth/Sex: Treating RN: 1926/12/09 (86 y.o. [name], Tammi Klippel Primary Care Provider: Wenda Low Other Clinician: Referring Provider: Treating Provider/Extender: Lajean Silvius, Fransisca Connors in Treatment: 0 Subjective Chief Complaint Information obtained from Patient 08/03/2020; patient is here for review of a wound on the left anterior mid tibia area trauma in the setting of chronic venous disease and poorly controlled edema History of Present Illness (HPI) ADMISSION 08/03/2020 Patient is a pleasant 86 year old woman accompanied by her granddaughter. She lives on her own but I think there is extensive family support. About 8 weeks ago she was taking off her compression stocking and traumatized the anterior part of her lower extremity on the left. She has seen her primary physician and was initially prescribed Keflex and then a course of  clindamycin. She has home health they are applying silver alginate ABDs and kerlix. Not making any progress with the wound and they are here for our review of this. Past medical history includes COPD, rosacea, history of lung cancer status post surgery on the right lung, hypertension, spinal stenosis, compression stocking use fairly reliably. She has advanced home [facility] already ABI on the left in our clinic was 1.19 Patient History Allergies penicillin G benzathine, sulfamethoxazole, codeine sulfate, aspirin Family History Cancer - Siblings, Heart Disease - Mother, Hypertension - Mother, Lung Disease - Siblings, Stroke - Mother, No family history of Diabetes, Hereditary Spherocytosis, Kidney Disease, Seizures, Thyroid Problems, Tuberculosis. Social History Former smoker - stopped at age 38, Marital Status - Divorced, Alcohol Use - Never, Drug Use - No History, Caffeine Use - Rarely. Medical History Respiratory Patient has history of Asthma, Chronic Obstructive Pulmonary Disease (COPD) Cardiovascular Patient has history of Hypertension Integumentary (Skin) Denies history of History of Burn Oncologic Patient has history of Received Chemotherapy Medical A Surgical History Notes nd Gastrointestinal GERD Genitourinary CKD 3 Musculoskeletal osteoporosis spinal stenosis Oncologic Lung CA, lobectomy (right) Review of Systems (ROS) Constitutional Symptoms (General Health) Denies complaints or symptoms of Fatigue, Fever, Chills, Marked Weight Change. Eyes Denies complaints or symptoms of Dry Eyes, Vision Changes, Glasses / Contacts. Ear/Nose/Mouth/Throat Denies complaints or symptoms of Chronic sinus problems or rhinitis. Endocrine Denies complaints or symptoms of Heat/cold intolerance. Integumentary (Skin) Complains or has symptoms of Wounds - left leg. Neurologic Denies complaints or symptoms of Numbness/parasthesias. Psychiatric Denies complaints or symptoms of Claustrophobia,  Suicidal. Objective Constitutional Sitting or standing Blood Pressure is within target range for patient.. Pulse regular and within target range for patient.Marland Kitchen Respirations regular, non-labored and within target range.. Temperature is normal and within the target range for the patient.Marland Kitchen Appears in no distress. Vitals Time Taken: 3:00 PM, Height: 61 in, Source: Stated, Weight: 155 lbs, Source: Stated, BMI: 29.3, Temperature: 98.6 F, Pulse: 70 bpm, Respiratory Rate: 20 breaths/min, Blood Pressure: 123/70 mmHg. Respiratory work of breathing is normal. Cardiovascular Pedal pulses are palpable on the left. Significant chronic edema in the left lower leg greater than the right. Very fragile skin in her bilateral lower extremities.. General Notes: Wound exam; fairly sizable wound on the left anterior lower leg 100% covered with a very adherent necrotic surface. I attempted to use an open curette to debride this first but subsequently switched to a #5 curette. Even with this I was not even able to get close to something that look like a healthy surface because of patient complaints of discomfort. Hemostasis with direct pressure. There was minimal bleeding. Integumentary (Hair, Skin) Skin  changes of chronic venous insufficiency in the bilateral lower legs left greater than right. Wound #1 status is Open. Original cause of wound was Blister. The wound is located on the Left,Medial Lower Leg. The wound measures 6.7cm length x 4.5cm width x 0.1cm depth; 23.68cm^2 area and 2.368cm^3 volume. There is Fat Layer (Subcutaneous Tissue) exposed. There is no tunneling or undermining noted. There is a medium amount of serous drainage noted. The wound margin is distinct with the outline attached to the wound base. There is medium (34-66%) red, pink granulation within the wound bed. There is a medium (34-66%) amount of necrotic tissue within the wound bed including Adherent Slough. Assessment Active  Problems ICD-10 Chronic venous hypertension (idiopathic) with ulcer and inflammation of left lower extremity Non-pressure chronic ulcer of left 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 Procedures Wound #1 Pre-procedure diagnosis of Wound #1 is a Venous Leg Ulcer located on the Left,Medial Lower Leg .Severity of Tissue Pre Debridement is: Fat layer exposed. There was a Excisional Skin/Subcutaneous Tissue Debridement with a total area of 30.15 sq cm performed by Ricard Dillon., MD. With the following instrument(s): Curette to remove Viable and Non-Viable tissue/material. Material removed includes Subcutaneous Tissue, Slough, Skin: Dermis, and Fibrin/Exudate after achieving pain control using Lidocaine 4% Topical Solution. A time out was conducted at 15:50, prior to the start of the procedure. A Minimum amount of bleeding was controlled with Pressure. The procedure was tolerated well with a pain level of 1 throughout and a pain level of 4 following the procedure. Post Debridement Measurements: 6.7cm length x 4.5cm width x 0.2cm depth; 4.736cm^3 volume. Character of Wound/Ulcer Post Debridement requires further debridement. Severity of Tissue Post Debridement is: Fat layer exposed. Post procedure Diagnosis Wound #1: Same as Pre-Procedure Pre-procedure diagnosis of Wound #1 is a Venous Leg Ulcer located on the Left,Medial Lower Leg . There was a Three Layer Compression Therapy Procedure with a pre-treatment ABI of 1.2 by Baruch Gouty, RN. Post procedure Diagnosis Wound #1: Same as Pre-Procedure Plan Follow-up Appointments: Return Appointment in 1 week. Dressing Change Frequency: Other: - twice a week. home health once a week. wound center weekly. Skin Barriers/Peri-Wound Care: Moisturizing lotion - patient to lotion right leg daily. home health to apply lotion with dressing changes to left leg. TCA Cream or Ointment - mixed with lotion to left leg in clinic. Wound Cleansing: May shower with  protection. - use cast protector. May shower and wash wound with soap and water. - with dressing changes. Primary Wound Dressing: Wound #1 Left,Medial Lower Leg: Iodoflex - or iodosorb ointment. Secondary Dressing: Dry Gauze ABD pad Edema Control: 3 Layer Compression System - Left Lower Extremity Avoid standing for long periods of time Elevate legs to the level of the heart or above for 30 minutes daily and/or when sitting, a frequency of: - throughout the day 3 to 4 times a day. Exercise regularly Support Garment 20-30 mm/Hg pressure to: - wear stocking on right leg. apply in the morning and remove at night. Home Health: [place] skilled nursing for wound care. - [facility]. 1. Partially successful debridement mechanically today in the clinic 2. Iodoflex under 3 layer compression 3. We will have home health change this once [[facility]] 4. She does not appear to have an arterial issue 5. She has been through 2 rounds of antibiotics currently I do not see any evidence of infection here no cultures were done and I did not prescribe antibiotics. 6. This wound had  a very difficult surface and I think it is going to take a prolonged period of time to get to a point where we can hope for epithelialization. Also very significant amounts of edema nonpitting in the left lower leg. Hopefully 3 layer compression will be enough here I spent 40 minutes on review of this patient's past medical history face-to-face evaluation and preparation of this record Electronic Signature(s) Signed: 08/03/2020 4:51:06 PM By: Linton Ham MD Entered By: Linton Ham on 08/03/2020 16:35:50 -------------------------------------------------------------------------------- HxROS Details Patient Name: Date of Service: [name] Starks M. 08/03/2020 2:45 PM Medical Record Number: [id number] Patient Account Number: [id number] Date of Birth/Sex: Treating RN: Jul 24, 1927 (86 y.o. [name] Primary Care Provider: Wenda Low Other Clinician: Referring Provider: Treating Provider/Extender: Bryon Lions in Treatment: 0 Constitutional Symptoms (General Health) Complaints and Symptoms: Negative for: Fatigue; Fever; Chills; Marked Weight Change Eyes Complaints and Symptoms: Negative for: Dry Eyes; Vision Changes; Glasses / Contacts Ear/Nose/Mouth/Throat Complaints and Symptoms: Negative for: Chronic sinus problems or rhinitis Endocrine Complaints and Symptoms: Negative for: Heat/cold intolerance Integumentary (Skin) Complaints and Symptoms: Positive for: Wounds - left leg Medical History: Negative for: History of Burn Neurologic Complaints and Symptoms: Negative for: Numbness/parasthesias Psychiatric Complaints and Symptoms: Negative for: Claustrophobia; Suicidal Hematologic/Lymphatic Respiratory Medical History: Positive for: Asthma; Chronic Obstructive Pulmonary Disease (COPD) Cardiovascular Medical History: Positive for: Hypertension Gastrointestinal Medical History: Past Medical History Notes: GERD Genitourinary Medical History: Past Medical History Notes: CKD 3 Musculoskeletal Medical History: Past Medical History Notes: osteoporosis spinal stenosis Oncologic Medical History: Positive for: Received Chemotherapy Past Medical History Notes: Lung CA, lobectomy (right) Immunizations Pneumococcal Vaccine: Received Pneumococcal Vaccination: Yes Implantable Devices None Family and Social History Cancer: Yes - Siblings; Diabetes: No; Heart Disease: Yes - Mother; Hereditary Spherocytosis: No; Hypertension: Yes - Mother; Kidney Disease: No; Lung Disease: Yes - Siblings; Seizures: No; Stroke: Yes - Mother; Thyroid Problems: No; Tuberculosis: No; Former smoker - stopped at age 82; Marital Status - Divorced; Alcohol Use: Never; Drug Use: No History; Caffeine Use: Rarely; Financial Concerns: No; Food, Clothing  or Shelter Needs: No; Support System Lacking: No; Transportation Concerns: No Electronic Signature(s) Signed: 08/03/2020 4:51:06 PM By: Linton Ham MD Signed: 08/03/2020 5:04:32 PM By: Kela Millin Entered By: Kela Millin on 08/03/2020 15:11:43 -------------------------------------------------------------------------------- SuperBill Details Patient Name: Date of Service: [name] [name]. 08/03/2020 Medical Record Number: [id number] Patient Account Number: [id number] Date of Birth/Sex: Treating RN: 07-21-1927 (86 y.o. [name], Tammi Klippel Primary Care Provider: Wenda Low Other Clinician: Referring Provider: Treating Provider/Extender: Bryon Lions in Treatment: 0 Diagnosis Coding ICD-10 Codes Code Description [phone] Chronic venous hypertension (idiopathic) with ulcer and inflammation of left lower extremity L97.222 Non-pressure chronic ulcer of left 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 Facility Procedures CPT4 Code: 21308657 Description: 99214 - WOUND CARE VISIT-LEV 4 EST PT Modifier: Quantity: 1 CPT4 Code: 84696295 Description: 28413 - DEB SUBQ TISSUE 20 SQ CM/< ICD-10 Diagnosis Description L97.222 Non-pressure chronic ulcer of left 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 Modifier: Quantity: 1 CPT4 Code: 24401027 Description: 11045 - DEB SUBQ TISS EA ADDL 20CM ICD-10 Diagnosis Description L97.222 Non-pressure chronic ulcer of left 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 Modifier: Quantity: 1 Physician Procedures : CPT4 Code Description Modifier [id number] WC PHYS LEVEL 3 NEW PT 25 ICD-10 Diagnosis Description I87.332 Chronic venous hypertension (idiopathic) with ulcer and inflammation of left lower extremity L97.222 Non-pressure chronic ulcer of left calf with fat  layer exposed Quantity: 1 : [id number] 11042 - WC PHYS  SUBQ TISS 20 SQ CM ICD-10 Diagnosis Description L97.222 Non-pressure chronic ulcer of left 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 Quantity: 1 : [id number] 11045 - WC  PHYS SUBQ TISS EA ADDL 20 CM 1 ICD-10 Diagnosis Description L97.222 Non-pressure chronic ulcer of left 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 Quantity: Electronic Signature(s) Signed: 08/03/2020 5:01:36 PM By: Deon Pilling Signed: 08/04/2020 4:24:19 PM By: Linton Ham MD Previous Signature: 08/03/2020 4:51:06 PM Version By: Linton Ham MD Entered By: Deon Pilling on 08/03/2020 16:56:33

## 2020-08-07 DIAGNOSIS — M810 Age-related osteoporosis without current pathological fracture: Secondary | ICD-10-CM | POA: Diagnosis not present

## 2020-08-07 DIAGNOSIS — Z902 Acquired absence of lung [part of]: Secondary | ICD-10-CM | POA: Diagnosis not present

## 2020-08-07 DIAGNOSIS — M4800 Spinal stenosis, site unspecified: Secondary | ICD-10-CM | POA: Diagnosis not present

## 2020-08-07 DIAGNOSIS — I058 Other rheumatic mitral valve diseases: Secondary | ICD-10-CM | POA: Diagnosis not present

## 2020-08-07 DIAGNOSIS — I129 Hypertensive chronic kidney disease with stage 1 through stage 4 chronic kidney disease, or unspecified chronic kidney disease: Secondary | ICD-10-CM | POA: Diagnosis not present

## 2020-08-07 DIAGNOSIS — G4700 Insomnia, unspecified: Secondary | ICD-10-CM | POA: Diagnosis not present

## 2020-08-07 DIAGNOSIS — E785 Hyperlipidemia, unspecified: Secondary | ICD-10-CM | POA: Diagnosis not present

## 2020-08-07 DIAGNOSIS — Z8781 Personal history of (healed) traumatic fracture: Secondary | ICD-10-CM | POA: Diagnosis not present

## 2020-08-07 DIAGNOSIS — J449 Chronic obstructive pulmonary disease, unspecified: Secondary | ICD-10-CM | POA: Diagnosis not present

## 2020-08-07 DIAGNOSIS — J309 Allergic rhinitis, unspecified: Secondary | ICD-10-CM | POA: Diagnosis not present

## 2020-08-07 DIAGNOSIS — M8580 Other specified disorders of bone density and structure, unspecified site: Secondary | ICD-10-CM | POA: Diagnosis not present

## 2020-08-07 DIAGNOSIS — Z85118 Personal history of other malignant neoplasm of bronchus and lung: Secondary | ICD-10-CM | POA: Diagnosis not present

## 2020-08-07 DIAGNOSIS — Z79891 Long term (current) use of opiate analgesic: Secondary | ICD-10-CM | POA: Diagnosis not present

## 2020-08-07 DIAGNOSIS — S81802D Unspecified open wound, left lower leg, subsequent encounter: Secondary | ICD-10-CM | POA: Diagnosis not present

## 2020-08-07 DIAGNOSIS — L03116 Cellulitis of left lower limb: Secondary | ICD-10-CM | POA: Diagnosis not present

## 2020-08-07 DIAGNOSIS — Z9181 History of falling: Secondary | ICD-10-CM | POA: Diagnosis not present

## 2020-08-07 DIAGNOSIS — M1990 Unspecified osteoarthritis, unspecified site: Secondary | ICD-10-CM | POA: Diagnosis not present

## 2020-08-07 DIAGNOSIS — M5030 Other cervical disc degeneration, unspecified cervical region: Secondary | ICD-10-CM | POA: Diagnosis not present

## 2020-08-07 DIAGNOSIS — N183 Chronic kidney disease, stage 3 unspecified: Secondary | ICD-10-CM | POA: Diagnosis not present

## 2020-08-07 DIAGNOSIS — Z4800 Encounter for change or removal of nonsurgical wound dressing: Secondary | ICD-10-CM | POA: Diagnosis not present

## 2020-08-07 DIAGNOSIS — K219 Gastro-esophageal reflux disease without esophagitis: Secondary | ICD-10-CM | POA: Diagnosis not present

## 2020-08-07 DIAGNOSIS — Z87891 Personal history of nicotine dependence: Secondary | ICD-10-CM | POA: Diagnosis not present

## 2020-08-07 DIAGNOSIS — M7542 Impingement syndrome of left shoulder: Secondary | ICD-10-CM | POA: Diagnosis not present

## 2020-08-07 DIAGNOSIS — E559 Vitamin D deficiency, unspecified: Secondary | ICD-10-CM | POA: Diagnosis not present

## 2020-08-08 ENCOUNTER — Ambulatory Visit: Payer: Medicare Other | Admitting: Podiatry

## 2020-08-08 DIAGNOSIS — I129 Hypertensive chronic kidney disease with stage 1 through stage 4 chronic kidney disease, or unspecified chronic kidney disease: Secondary | ICD-10-CM | POA: Diagnosis not present

## 2020-08-08 DIAGNOSIS — S81802D Unspecified open wound, left lower leg, subsequent encounter: Secondary | ICD-10-CM | POA: Diagnosis not present

## 2020-08-08 DIAGNOSIS — L03116 Cellulitis of left lower limb: Secondary | ICD-10-CM | POA: Diagnosis not present

## 2020-08-08 DIAGNOSIS — N183 Chronic kidney disease, stage 3 unspecified: Secondary | ICD-10-CM | POA: Diagnosis not present

## 2020-08-08 DIAGNOSIS — E785 Hyperlipidemia, unspecified: Secondary | ICD-10-CM | POA: Diagnosis not present

## 2020-08-08 DIAGNOSIS — J449 Chronic obstructive pulmonary disease, unspecified: Secondary | ICD-10-CM | POA: Diagnosis not present

## 2020-08-11 ENCOUNTER — Other Ambulatory Visit: Payer: Self-pay

## 2020-08-11 ENCOUNTER — Encounter (HOSPITAL_BASED_OUTPATIENT_CLINIC_OR_DEPARTMENT_OTHER): Payer: Medicare Other | Attending: Internal Medicine | Admitting: Internal Medicine

## 2020-08-11 DIAGNOSIS — F329 Major depressive disorder, single episode, unspecified: Secondary | ICD-10-CM | POA: Diagnosis not present

## 2020-08-11 DIAGNOSIS — F325 Major depressive disorder, single episode, in full remission: Secondary | ICD-10-CM | POA: Diagnosis not present

## 2020-08-11 DIAGNOSIS — E7800 Pure hypercholesterolemia, unspecified: Secondary | ICD-10-CM | POA: Diagnosis not present

## 2020-08-11 DIAGNOSIS — L97222 Non-pressure chronic ulcer of left 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: Secondary | ICD-10-CM | POA: Diagnosis not present

## 2020-08-11 DIAGNOSIS — I87332 Chronic venous hypertension (idiopathic) with ulcer and inflammation of left lower extremity: Secondary | ICD-10-CM | POA: Diagnosis not present

## 2020-08-11 DIAGNOSIS — N183 Chronic kidney disease, stage 3 unspecified: Secondary | ICD-10-CM | POA: Diagnosis not present

## 2020-08-11 DIAGNOSIS — E782 Mixed hyperlipidemia: Secondary | ICD-10-CM | POA: Diagnosis not present

## 2020-08-11 DIAGNOSIS — C3490 Malignant neoplasm of unspecified part of unspecified bronchus or lung: Secondary | ICD-10-CM | POA: Diagnosis not present

## 2020-08-11 DIAGNOSIS — J449 Chronic obstructive pulmonary disease, unspecified: Secondary | ICD-10-CM | POA: Diagnosis not present

## 2020-08-11 DIAGNOSIS — C3491 Malignant neoplasm of unspecified part of right bronchus or lung: Secondary | ICD-10-CM | POA: Diagnosis not present

## 2020-08-11 DIAGNOSIS — I872 Venous insufficiency (chronic) (peripheral): Secondary | ICD-10-CM | POA: Diagnosis not present

## 2020-08-11 DIAGNOSIS — M1990 Unspecified osteoarthritis, unspecified site: Secondary | ICD-10-CM | POA: Diagnosis not present

## 2020-08-11 DIAGNOSIS — I10 Essential (primary) hypertension: Secondary | ICD-10-CM | POA: Diagnosis not present

## 2020-08-11 DIAGNOSIS — F322 Major depressive disorder, single episode, severe without psychotic features: Secondary | ICD-10-CM | POA: Diagnosis not present

## 2020-08-11 DIAGNOSIS — N1831 Chronic kidney disease, stage 3a: Secondary | ICD-10-CM | POA: Diagnosis not present

## 2020-08-14 DIAGNOSIS — I129 Hypertensive chronic kidney disease with stage 1 through stage 4 chronic kidney disease, or unspecified chronic kidney disease: Secondary | ICD-10-CM | POA: Diagnosis not present

## 2020-08-14 DIAGNOSIS — S81802D Unspecified open wound, left lower leg, subsequent encounter: Secondary | ICD-10-CM | POA: Diagnosis not present

## 2020-08-14 DIAGNOSIS — J449 Chronic obstructive pulmonary disease, unspecified: Secondary | ICD-10-CM | POA: Diagnosis not present

## 2020-08-14 DIAGNOSIS — E785 Hyperlipidemia, unspecified: Secondary | ICD-10-CM | POA: Diagnosis not present

## 2020-08-14 DIAGNOSIS — L03116 Cellulitis of left lower limb: Secondary | ICD-10-CM | POA: Diagnosis not present

## 2020-08-14 DIAGNOSIS — N183 Chronic kidney disease, stage 3 unspecified: Secondary | ICD-10-CM | POA: Diagnosis not present

## 2020-08-14 NOTE — Progress Notes (Signed)
[name] [name] ([id number]) Visit Report for 08/11/2020 Debridement Details Patient Name: Date of Service: [name] [name]. 08/11/2020 12:45 PM Medical Record Number: [id number] Patient Account Number: [id number] Date of Birth/Sex: Treating RN: August 14, 1927 (86 y.o. [name] [name] Primary Care Provider: Wenda [name] Other Clinician: Referring Provider: Treating Provider/Extender: [name] [name] in Treatment: 1 Debridement Performed for Assessment: Wound #1 Left,Medial Lower Leg Performed By: Physician [name] [name]., MD Debridement Type: Debridement Severity of Tissue Pre Debridement: Fat layer exposed Level of Consciousness (Pre-procedure): Awake and Alert Pre-procedure Verification/Time Out Yes - 13:45 Taken: Start Time: 13:45 Pain Control: Other : benzocaine, 20% T Area Debrided (L x W): otal 4 (cm) x 3 (cm) = 12 (cm) Tissue and other material debrided: Viable, Non-Viable, Subcutaneous Level: Skin/Subcutaneous Tissue Debridement Description: Excisional Instrument: Curette Bleeding: Moderate Hemostasis Achieved: Pressure End Time: 13:45 Procedural Pain: 3 Post Procedural Pain: 0 Response to Treatment: Procedure was tolerated well Level of Consciousness (Post- Awake and Alert procedure): Post Debridement Measurements of Total Wound Length: (cm) 6.5 Width: (cm) 3.5 Depth: (cm) 0.2 Volume: (cm) 3.574 Character of Wound/Ulcer Post Debridement: Improved Severity of Tissue Post Debridement: Fat layer exposed Post Procedure Diagnosis Same as Pre-procedure Electronic Signature(s) Signed: 08/11/2020 5:35:06 PM By: [name] [name] Signed: 08/14/2020 5:03:49 PM By: [name] Ham MD Entered By: [name] [name] on 08/11/2020 13:59:03 -------------------------------------------------------------------------------- HPI Details Patient Name: Date of Service: [name] [name], [name] Ames M. 08/11/2020 12:45 PM Medical Record Number:  [id number] Patient Account Number: [id number] Date of Birth/Sex: Treating RN: 1927-09-20 (86 y.o. [name] [name] Primary Care Provider: Wenda [name] Other Clinician: Referring Provider: Treating Provider/Extender: [name] [name] in Treatment: 1 History of Present Illness HPI Description: ADMISSION 08/03/2020 Patient is a pleasant 86 year old woman accompanied by her granddaughter. She lives on her own but I think there is extensive family support. About 8 weeks ago she was taking off her compression stocking and traumatized the anterior part of her lower extremity on the left. She has seen her primary physician and was initially prescribed Keflex and then a course of clindamycin. She has home health they are applying silver alginate ABDs and kerlix. Not making any progress with the wound and they are here for our review of this. Past medical history includes COPD, rosacea, history of lung cancer status post surgery on the right lung, hypertension, spinal stenosis, compression stocking use fairly reliably. She has advanced home care already ABI on the left in our clinic was 1.19 10/8; patient with a large predominantly venous wound in the left medial lower leg. We used Iodoflex under compression last week. The wound looks slightly better Electronic Signature(s) Signed: 08/14/2020 5:03:49 PM By: [name] Ham MD Entered By: [name] [name] on 08/11/2020 13:59:37 -------------------------------------------------------------------------------- Physical Exam Details Patient Name: Date of Service: [name] Starks M. 08/11/2020 12:45 PM Medical Record Number: [id number] Patient Account Number: [id number] Date of Birth/Sex: Treating RN: [birth year] (86 y.o. [name] [name] Primary Care Provider: Wenda [name] Other Clinician: Referring Provider: Treating Provider/Extender: [name] [name] in Treatment: 1 Constitutional Sitting or  standing Blood Pressure is within target range for patient.. Pulse regular and within target range for patient.Marland Kitchen Respirations regular, non-labored and within target range.. Temperature is normal and within the target range for the patient.Marland Kitchen Appears in no distress. Notes Wound exam; fairly sizable wound on the left anterior lower leg. The surface looks slightly better than the 100% fibrinous surface we encountered last time unfortunately she does not tolerate  debridement. I was able to get to roughly 1/3 of the wound surface using a #5 curette. She simply cannot tolerate this. There is no evidence of surrounding infection Electronic Signature(s) Signed: 08/14/2020 5:03:49 PM By: [name] Ham MD Entered By: [name] [name] on 08/11/2020 14:00:38 -------------------------------------------------------------------------------- Physician Orders Details Patient Name: Date of Service: [name] Starks M. 08/11/2020 12:45 PM Medical Record Number: [id number] Patient Account Number: [id number] Date of Birth/Sex: Treating RN: December 12, 1926 (86 y.o. [name] [name] Primary Care Provider: Wenda [name] Other Clinician: Referring Provider: Treating Provider/Extender: [name] [name] in Treatment: 1 Verbal / Phone Orders: No Diagnosis Coding ICD-10 Coding Code Description [phone] Chronic venous hypertension (idiopathic) with ulcer and inflammation of left lower extremity L97.222 Non-pressure chronic ulcer of left 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 Follow-up Appointments Return Appointment in 2 weeks. Dressing Change Frequency Other: - HH to change twice a week Skin Barriers/Peri-Wound Care Moisturizing lotion - patient to lotion right leg daily. home health to apply lotion with dressing changes to left leg. TCA Cream or Ointment - mixed with lotion to left leg in clinic. Wound Cleansing May shower with protection. - use cast protector. May shower and wash wound with soap and  water. - with dressing changes. Primary Wound Dressing Wound #1 Left,Medial Lower Leg Iodoflex - or iodosorb ointment. Secondary Dressing Dry Gauze ABD pad Edema Control 3 Layer Compression System - Left Lower Extremity Avoid standing for long periods of time Elevate legs to the level of the heart or above for 30 minutes daily and/or when sitting, a frequency of: - throughout the day 3 to 4 times a day. Exercise regularly Support Garment 20-30 mm/Hg pressure to: - wear stocking on right leg. apply in the morning and remove at night. [facility] skilled nursing for wound care. - [facility]. Electronic Signature(s) Signed: 08/11/2020 5:35:06 PM By: [name] [name] Signed: 08/14/2020 5:03:49 PM By: [name] Ham MD Entered By: [name] [name] on 08/11/2020 13:56:41 -------------------------------------------------------------------------------- Problem List Details Patient Name: Date of Service: [name] Starks M. 08/11/2020 12:45 PM Medical Record Number: [id number] Patient Account Number: [id number] Date of Birth/Sex: Treating RN: 12/22/1926 (86 y.o. [name] [name] Primary Care Provider: Wenda [name] Other Clinician: Referring Provider: Treating Provider/Extender: [name] [name] in Treatment: 1 Active Problems ICD-10 Encounter Code Description Active Date MDM Diagnosis I87.332 Chronic venous hypertension (idiopathic) with ulcer and inflammation of left 08/03/2020 No Yes lower extremity L97.222 Non-pressure chronic ulcer of left 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 08/03/2020 No Yes Inactive Problems Resolved Problems Electronic Signature(s) Signed: 08/14/2020 5:03:49 PM By: [name] Ham MD Entered By: [name] [name] on 08/11/2020 13:58:41 -------------------------------------------------------------------------------- Progress Note Details Patient Name: Date of Service: [name] Starks M. 08/11/2020 12:45  PM Medical Record Number: [id number] Patient Account Number: [id number] Date of Birth/Sex: Treating RN: [birth year] (86 y.o. [name] [name] Primary Care Provider: Wenda [name] Other Clinician: Referring Provider: Treating Provider/Extender: [name] [name] in Treatment: 1 Subjective History of Present Illness (HPI) ADMISSION 08/03/2020 Patient is a pleasant 86 year old woman accompanied by her granddaughter. She lives on her own but I think there is extensive family support. About 8 weeks ago she was taking off her compression stocking and traumatized the anterior part of her lower extremity on the left. She has seen her primary physician and was initially prescribed Keflex and then a course of clindamycin. She has home health they are applying silver alginate ABDs and kerlix. Not making any progress with  the wound and they are here for our review of this. Past medical history includes COPD, rosacea, history of lung cancer status post surgery on the right lung, hypertension, spinal stenosis, compression stocking use fairly reliably. She has advanced home care already ABI on the left in our clinic was 1.19 10/8; patient with a large predominantly venous wound in the left medial lower leg. We used Iodoflex under compression last week. The wound looks slightly better Objective Constitutional Sitting or standing Blood Pressure is within target range for patient.. Pulse regular and within target range for patient.Marland Kitchen Respirations regular, non-labored and within target range.. Temperature is normal and within the target range for the patient.Marland Kitchen Appears in no distress. Vitals Time Taken: 1:11 PM, Height: 61 in, Weight: 155 lbs, BMI: 29.3, Temperature: 98.4 F, Pulse: 83 bpm, Respiratory Rate: 18 breaths/min, Blood Pressure: 112/66 mmHg. General Notes: Wound exam; fairly sizable wound on the left anterior lower leg. The surface looks slightly better than the 100% fibrinous  surface we encountered last time unfortunately she does not tolerate debridement. I was able to get to roughly 1/3 of the wound surface using a #5 curette. She simply cannot tolerate this. There is no evidence of surrounding infection Integumentary (Hair, Skin) Wound #1 status is Open. Original cause of wound was Blister. The wound is located on the Left,Medial Lower Leg. The wound measures 6.5cm length x 3.5cm width x 0.2cm depth; 17.868cm^2 area and 3.574cm^3 volume. There is Fat Layer (Subcutaneous Tissue) exposed. There is no tunneling or undermining noted. There is a medium amount of serous drainage noted. The wound margin is distinct with the outline attached to the wound base. There is medium (34-66%) red, pink granulation within the wound bed. There is a medium (34-66%) amount of necrotic tissue within the wound bed including Adherent Slough. Assessment Active Problems ICD-10 Chronic venous hypertension (idiopathic) with ulcer and inflammation of left lower extremity Non-pressure chronic ulcer of left 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 Procedures Wound #1 Pre-procedure diagnosis of Wound #1 is a Venous Leg Ulcer located on the Left,Medial Lower Leg .Severity of Tissue Pre Debridement is: Fat layer exposed. There was a Excisional Skin/Subcutaneous Tissue Debridement with a total area of 12 sq cm performed by [name] [name]., MD. With the following instrument(s): Curette to remove Viable and Non-Viable tissue/material. Material removed includes Subcutaneous Tissue after achieving pain control using Other (benzocaine, 20%). No specimens were taken. A time out was conducted at 13:45, prior to the start of the procedure. A Moderate amount of bleeding was controlled with Pressure. The procedure was tolerated well with a pain level of 3 throughout and a pain level of 0 following the procedure. Post Debridement Measurements: 6.5cm length x 3.5cm width x 0.2cm depth; 3.574cm^3 volume. Character of  Wound/Ulcer Post Debridement is improved. Severity of Tissue Post Debridement is: Fat layer exposed. Post procedure Diagnosis Wound #1: Same as Pre-Procedure Pre-procedure diagnosis of Wound #1 is a Venous Leg Ulcer located on the Left,Medial Lower Leg . There was a Three Layer Compression Therapy Procedure by Deon Pilling, RN. Post procedure Diagnosis Wound #1: Same as Pre-Procedure Plan Follow-up Appointments: Return Appointment in 2 weeks. Dressing Change Frequency: Other: - HH to change twice a week Skin Barriers/Peri-Wound Care: Moisturizing lotion - patient to lotion right leg daily. home health to apply lotion with dressing changes to left leg. TCA Cream or Ointment - mixed with lotion to left leg in clinic. Wound Cleansing: May shower with protection. - use cast protector. May shower  and wash wound with soap and water. - with dressing changes. Primary Wound Dressing: Wound #1 Left,Medial Lower Leg: Iodoflex - or iodosorb ointment. Secondary Dressing: Dry Gauze ABD pad Edema Control: 3 Layer Compression System - Left Lower Extremity Avoid standing for long periods of time Elevate legs to the level of the heart or above for 30 minutes daily and/or when sitting, a frequency of: - throughout the day 3 to 4 times a day. Exercise regularly Support Garment 20-30 mm/Hg pressure to: - wear stocking on right leg. apply in the morning and remove at night. Home Health: [facility] skilled nursing for wound care. - [facility]. 1. I am continue with Iodoflex for the next 2 weeks. She has home health and I will see her back in 2 weeks 2. Unfortunately I do not think this woman is going to be able to tolerate mechanical debridement. I could try subcutaneous lidocaine but with the size of this wound it would take multiple injections 3. No current evidence of infection. 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 08/11/2020 Medical Record Number: [id number] Patient Account Number: [id number] Date of Birth/Sex: Treating RN: October 25, 1927 (86 y.o. [name] [name] Primary Care Provider: Wenda [name] Other Clinician: Referring Provider: Treating Provider/Extender: [name] [name] in Treatment: 1 Diagnosis Coding ICD-10 Codes Code Description [phone] Chronic venous hypertension (idiopathic) with ulcer and inflammation of left lower extremity L97.222 Non-pressure chronic ulcer of left 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 Facility Procedures CPT4 Code: [id number] Description: [place] TISSUE 20 SQ CM/< ICD-10 Diagnosis Description L97.222 Non-pressure chronic ulcer of left 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 Modifier: Quantity: 1 Physician Procedures : CPT4 Code Description Modifier [id number] - WC PHYS SUBQ TISS 20 SQ CM ICD-10 Diagnosis Description L97.222 Non-pressure chronic ulcer of left 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 Quantity: 1 Electronic Signature(s) Signed: 08/14/2020 5:03:49 PM By: [name] Ham MD Entered By: [name] [name] on 08/11/2020 14:01:39

## 2020-08-15 NOTE — Progress Notes (Signed)
[name], [name] ([id number]) Visit Report for 08/11/2020 Arrival Information Details Patient Name: Date of Service: [name] [name]. 08/11/2020 12:45 PM Medical Record Number: [id number] Patient Account Number: [id number] Date of Birth/Sex: Treating RN: [birth year] (86 y.o. [name] [name] Primary Care Amaan Meyer: Wenda Low Other Clinician: Referring Deontrae Drinkard: Treating Duriel Deery/Extender: Bryon Lions in Treatment: 1 Visit Information History Since Last Visit All ordered tests and consults were completed: No Patient Arrived: Wheel Chair Added or deleted any medications: No Arrival Time: 13:10 Any new allergies or adverse reactions: No Accompanied By: self Had a fall or experienced change in No Transfer Assistance: None activities of daily living that may affect Patient Identification Verified: Yes risk of falls: Secondary Verification Process Completed: Yes Signs or symptoms of abuse/neglect since last visito No Hospitalized since last visit: No Implantable device outside of the clinic excluding No cellular tissue based products placed in the center since last visit: Has Dressing in Place as Prescribed: Yes Has Compression in Place as Prescribed: Yes Pain Present Now: No Electronic Signature(s) Signed: 08/15/2020 5:49:00 PM By: Carlene Coria RN Entered By: Carlene Coria on 08/11/2020 13:10:59 -------------------------------------------------------------------------------- Compression Therapy Details Patient Name: Date of Service: [name] [name]. 08/11/2020 12:45 PM Medical Record Number: [id number] Patient Account Number: [id number] Date of Birth/Sex: Treating RN: January 04, 1927 (86 y.o. [name] Primary Care Alizia Greif: Wenda Low Other Clinician: Referring Brennden Masten: Treating Nassim Cosma/Extender: Bryon Lions in Treatment: 1 Compression Therapy Performed for Wound Assessment: Wound #1 Left,Medial Lower  Leg Performed By: Clinician Deon Pilling, RN Compression Type: Three Layer Post Procedure Diagnosis Same as Pre-procedure Electronic Signature(s) Signed: 08/11/2020 5:35:06 PM By: Kela Millin Entered By: Kela Millin on 08/11/2020 13:45:52 -------------------------------------------------------------------------------- Encounter Discharge Information Details Patient Name: Date of Service: [name] Starks M. 08/11/2020 12:45 PM Medical Record Number: [id number] Patient Account Number: [id number] Date of Birth/Sex: Treating RN: 29-May-1927 (86 y.o. [name] [name] Primary Care Tabius Rood: Wenda Low Other Clinician: Referring Livie Vanderhoof: Treating Margaretha Mahan/Extender: Bryon Lions in Treatment: 1 Encounter Discharge Information Items Post Procedure Vitals Discharge Condition: Stable Temperature (F): 98.4 Ambulatory Status: Wheelchair Pulse (bpm): 84 Discharge Destination: Home Respiratory Rate (breaths/min): 18 Transportation: Private Auto Blood Pressure (mmHg): 112/66 Accompanied By: daughter Schedule Follow-up Appointment: Yes Clinical Summary of Care: Electronic Signature(s) Signed: 08/11/2020 5:50:34 PM By: Deon Pilling Entered By: Deon Pilling on 08/11/2020 14:05:41 -------------------------------------------------------------------------------- Lower Extremity Assessment Details Patient Name: Date of Service: [name] [name] 08/11/2020 12:45 PM Medical Record Number: [id number] Patient Account Number: [id number] Date of Birth/Sex: Treating RN: 26-Feb-1927 (86 y.o. [name] [name] Primary Care Jamahl Lemmons: Wenda Low Other Clinician: Referring Journi Moffa: Treating Serapio Edelson/Extender: Bryon Lions in Treatment: 1 Edema Assessment Assessed: Shirlyn Goltz: No] Patrice Paradise: No] E[Left: dema] [Right: :] Calf Left: Right: Point of Measurement: From Medial Instep 35.5 cm Ankle Left: Right: Point of Measurement: From  Medial Instep 23 cm Electronic Signature(s) Signed: 08/15/2020 5:49:00 PM By: Carlene Coria RN Entered By: Carlene Coria on 08/11/2020 13:28:57 -------------------------------------------------------------------------------- Multi Wound Chart Details Patient Name: Date of Service: [name] Starks M. 08/11/2020 12:45 PM Medical Record Number: [id number] Patient Account Number: [id number] Date of Birth/Sex: Treating RN: April 09, 1927 (86 y.o. [name] Primary Care Addilyne Backs: Wenda Low Other Clinician: Referring Raffaela Ladley: Treating Lezette Kitts/Extender: Bryon Lions in Treatment: 1 Vital Signs Height(in): 61 Pulse(bpm): 42 Weight(lbs): 155 Blood Pressure(mmHg): 112/66 Body Mass Index(BMI): 29 Temperature(F): 98.4 Respiratory Rate(breaths/min): 18 Photos: [1:No Photos Left, Medial  Lower Leg] [N/A:N/A N/A] Wound Location: [1:Blister] [N/A:N/A] Wounding Event: [1:Venous Leg Ulcer] [N/A:N/A] Primary Etiology: [1:Asthma, Chronic Obstructive] [N/A:N/A] Comorbid History: [1:Pulmonary Disease (COPD), Hypertension, Received Chemotherapy 06/19/2020] [N/A:N/A] Date Acquired: [1:1] [N/A:N/A] Weeks of Treatment: [1:Open] [N/A:N/A] Wound Status: [1:6.5x3.5x0.2] [N/A:N/A] Measurements L x W x D (cm) [1:17.868] [N/A:N/A] A (cm) : rea [1:3.574] [N/A:N/A] Volume (cm) : [1:24.50%] [N/A:N/A] % Reduction in A [1:rea: -50.90%] [N/A:N/A] % Reduction in Volume: [1:Full Thickness Without Exposed] [N/A:N/A] Classification: [1:Support Structures Medium] [N/A:N/A] Exudate A mount: [1:Serous] [N/A:N/A] Exudate Type: [1:amber] [N/A:N/A] Exudate Color: [1:Distinct, outline attached] [N/A:N/A] Wound Margin: [1:Medium (34-66%)] [N/A:N/A] Granulation A mount: [1:Red, Pink] [N/A:N/A] Granulation Quality: [1:Medium (34-66%)] [N/A:N/A] Necrotic A mount: [1:Fat Layer (Subcutaneous Tissue): Yes N/A] Exposed Structures: [1:Fascia: No Tendon: No Muscle: No Joint: No Bone: No  None] [N/A:N/A] Epithelialization: [1:Debridement - Excisional] [N/A:N/A] Debridement: Pre-procedure Verification/Time Out 13:45 [N/A:N/A] Taken: [1:Other] [N/A:N/A] Pain Control: [1:Subcutaneous] [N/A:N/A] Tissue Debrided: [1:Skin/Subcutaneous Tissue] [N/A:N/A] Level: [1:12] [N/A:N/A] Debridement A (sq cm): [1:rea Curette] [N/A:N/A] Instrument: [1:Moderate] [N/A:N/A] Bleeding: [1:Pressure] [N/A:N/A] Hemostasis A chieved: [1:3] [N/A:N/A] Procedural Pain: [1:0] [N/A:N/A] Post Procedural Pain: [1:Procedure was tolerated well] [N/A:N/A] Debridement Treatment Response: [1:6.5x3.5x0.2] [N/A:N/A] Post Debridement Measurements L x W x D (cm) [1:3.574] [N/A:N/A] Post Debridement Volume: (cm) [1:Compression Therapy] [N/A:N/A] Procedures Performed: [1:Debridement] Treatment Notes Electronic Signature(s) Signed: 08/11/2020 5:35:06 PM By: Kela Millin Signed: 08/14/2020 5:03:49 PM By: Linton Ham MD Entered By: Linton Ham on 08/11/2020 13:58:51 -------------------------------------------------------------------------------- Multi-Disciplinary Care Plan Details Patient Name: Date of Service: [name], [name] 08/11/2020 12:45 PM Medical Record Number: [id number] Patient Account Number: [id number] Date of Birth/Sex: Treating RN: 08-May-1927 (86 y.o. [name] Primary Care Estefana Taylor: Wenda Low Other Clinician: Referring Juelle Dickmann: Treating Madelene Kaatz/Extender: Bryon Lions in Treatment: 1 Active Inactive Orientation to the Wound Care Program Nursing Diagnoses: Knowledge deficit related to the wound healing center program Goals: Patient/caregiver will verbalize understanding of the [facility] Date Initiated: 08/03/2020 Target Resolution Date: 09/08/2020 Goal Status: Active Interventions: Provide education on orientation to the wound center Notes: Wound/Skin Impairment Nursing Diagnoses: Knowledge deficit related to  ulceration/compromised skin integrity Goals: Patient/caregiver will verbalize understanding of skin care regimen Date Initiated: 08/03/2020 Target Resolution Date: 09/08/2020 Goal Status: Active Interventions: Assess patient/caregiver ability to perform ulcer/skin care regimen upon admission and as needed Provide education on ulcer and skin care Treatment Activities: Skin care regimen initiated : 08/03/2020 Topical wound management initiated : 08/03/2020 Notes: Electronic Signature(s) Signed: 08/11/2020 5:35:06 PM By: Kela Millin Entered By: Kela Millin on 08/11/2020 13:06:59 -------------------------------------------------------------------------------- Pain Assessment Details Patient Name: Date of Service: [name] [name]. 08/11/2020 12:45 PM Medical Record Number: [id number] Patient Account Number: [id number] Date of Birth/Sex: Treating RN: [birth year] (86 y.o. [name] [name] Primary Care Kaziah Krizek: Wenda Low Other Clinician: Referring Hasson Gaspard: Treating Lux Meaders/Extender: Bryon Lions in Treatment: 1 Active Problems Location of Pain Severity and Description of Pain Patient Has Paino No Site Locations Pain Management and Medication Current Pain Management: Electronic Signature(s) Signed: 08/15/2020 5:49:00 PM By: Carlene Coria RN Entered By: Carlene Coria on 08/11/2020 13:13:03 -------------------------------------------------------------------------------- Patient/Caregiver Education Details Patient Name: Date of Service: [name] [name] 10/8/2021andnbsp12:45 PM Medical Record Number: [id number] Patient Account Number: [id number] Date of Birth/Gender: Treating RN: [birth year] (86 y.o. [name] Primary Care Physician: Wenda Low Other Clinician: Referring Physician: Treating Physician/Extender: Bryon Lions in Treatment: 1 Education Assessment Education Provided  To: Patient Education Topics Provided Welcome T The [facility]: o Handouts: Welcome T [facility] [place] o Methods: Explain/Verbal Responses: State content correctly Wound/Skin Impairment: Handouts: Caring for Your Ulcer Methods: Explain/Verbal Responses: State content correctly Electronic Signature(s) Signed: 08/11/2020 5:35:06 PM By: Kela Millin Entered By: Kela Millin on 08/11/2020 13:07:24 -------------------------------------------------------------------------------- Wound Assessment Details Patient Name: Date of Service: [name] Starks M. 08/11/2020 12:45 PM Medical Record Number: [id number] Patient Account Number: [id number] Date of Birth/Sex: Treating RN: 09-21-1927 (86 y.o. [name] [name] Primary Care Navpreet Szczygiel: Wenda Low Other Clinician: Referring Nikalas Bramel: Treating Joesiah Lonon/Extender: Bryon Lions in Treatment: 1 Wound Status Wound Number: 1 Primary Venous Leg Ulcer Etiology: Wound Location: Left, Medial Lower Leg Wound Open Wounding Event: Blister Status: Date Acquired: 06/19/2020 Comorbid Asthma, Chronic Obstructive Pulmonary Disease (COPD), Weeks Of Treatment: 1 History: Hypertension, Received Chemotherapy Clustered Wound: No Photos Photo Uploaded By: Mikeal Hawthorne on 08/14/2020 13:10:34 Wound Measurements Length: (cm) 6.5 Width: (cm) 3.5 Depth: (cm) 0.2 Area: (cm) 17.868 Volume: (cm) 3.574 % Reduction in Area: 24.5% % Reduction in Volume: -50.9% Epithelialization: None Tunneling: No Undermining: No Wound Description Classification: Full Thickness Without Exposed Support Structu Wound Margin: Distinct, outline attached Exudate Amount: Medium Exudate Type: Serous Exudate Color: amber res Foul Odor After Cleansing: No Slough/Fibrino Yes Wound Bed Granulation Amount: Medium (34-66%) Exposed Structure Granulation Quality: Red, Pink Fascia Exposed: No Necrotic Amount: Medium (34-66%) Fat  Layer (Subcutaneous Tissue) Exposed: Yes Necrotic Quality: Adherent Slough Tendon Exposed: No Muscle Exposed: No Joint Exposed: No Bone Exposed: No Treatment Notes Wound #1 (Left, Medial Lower Leg) 1. Cleanse With Wound Cleanser Soap and water 2. Periwound Care Moisturizing lotion TCA Cream 3. Primary Dressing Applied Iodoflex 4. Secondary Dressing ABD Pad Dry Gauze 6. Support Layer Applied 3 layer compression wrap Notes netting. Electronic Signature(s) Signed: 08/15/2020 5:49:00 PM By: Carlene Coria RN Entered By: Carlene Coria on 08/11/2020 13:29:22 -------------------------------------------------------------------------------- Vitals Details Patient Name: Date of Service: [name], [name] 08/11/2020 12:45 PM Medical Record Number: [id number] Patient Account Number: [id number] Date of Birth/Sex: Treating RN: 1927/09/06 (86 y.o. [name] [name] Primary Care Shed Nixon: Wenda Low Other Clinician: Referring Joseluis Alessio: Treating Stanisha Lorenz/Extender: Bryon Lions in Treatment: 1 Vital Signs Time Taken: 13:11 Temperature (F): 98.4 Height (in): 61 Pulse (bpm): 83 Weight (lbs): 155 Respiratory Rate (breaths/min): 18 Body Mass Index (BMI): 29.3 Blood Pressure (mmHg): 112/66 Reference Range: 80 - 120 mg / dl Electronic Signature(s) Signed: 08/15/2020 5:49:00 PM By: Carlene Coria RN Entered By: Carlene Coria on 08/11/2020 13:11:25

## 2020-08-18 DIAGNOSIS — I129 Hypertensive chronic kidney disease with stage 1 through stage 4 chronic kidney disease, or unspecified chronic kidney disease: Secondary | ICD-10-CM | POA: Diagnosis not present

## 2020-08-18 DIAGNOSIS — N183 Chronic kidney disease, stage 3 unspecified: Secondary | ICD-10-CM | POA: Diagnosis not present

## 2020-08-18 DIAGNOSIS — L03116 Cellulitis of left lower limb: Secondary | ICD-10-CM | POA: Diagnosis not present

## 2020-08-18 DIAGNOSIS — S81802D Unspecified open wound, left lower leg, subsequent encounter: Secondary | ICD-10-CM | POA: Diagnosis not present

## 2020-08-18 DIAGNOSIS — E785 Hyperlipidemia, unspecified: Secondary | ICD-10-CM | POA: Diagnosis not present

## 2020-08-18 DIAGNOSIS — J449 Chronic obstructive pulmonary disease, unspecified: Secondary | ICD-10-CM | POA: Diagnosis not present

## 2020-08-23 DIAGNOSIS — S81802D Unspecified open wound, left lower leg, subsequent encounter: Secondary | ICD-10-CM | POA: Diagnosis not present

## 2020-08-23 DIAGNOSIS — I129 Hypertensive chronic kidney disease with stage 1 through stage 4 chronic kidney disease, or unspecified chronic kidney disease: Secondary | ICD-10-CM | POA: Diagnosis not present

## 2020-08-23 DIAGNOSIS — N183 Chronic kidney disease, stage 3 unspecified: Secondary | ICD-10-CM | POA: Diagnosis not present

## 2020-08-23 DIAGNOSIS — E785 Hyperlipidemia, unspecified: Secondary | ICD-10-CM | POA: Diagnosis not present

## 2020-08-23 DIAGNOSIS — L03116 Cellulitis of left lower limb: Secondary | ICD-10-CM | POA: Diagnosis not present

## 2020-08-23 DIAGNOSIS — J449 Chronic obstructive pulmonary disease, unspecified: Secondary | ICD-10-CM | POA: Diagnosis not present

## 2020-08-25 ENCOUNTER — Encounter (HOSPITAL_BASED_OUTPATIENT_CLINIC_OR_DEPARTMENT_OTHER): Payer: Medicare Other | Admitting: Internal Medicine

## 2020-08-25 ENCOUNTER — Other Ambulatory Visit: Payer: Self-pay

## 2020-08-25 DIAGNOSIS — I87332 Chronic venous hypertension (idiopathic) with ulcer and inflammation of left lower extremity: Secondary | ICD-10-CM | POA: Diagnosis not present

## 2020-08-25 DIAGNOSIS — L97822 Non-pressure chronic ulcer of other part of left lower leg with fat layer exposed: Secondary | ICD-10-CM | POA: Diagnosis not present

## 2020-08-25 DIAGNOSIS — I872 Venous insufficiency (chronic) (peripheral): Secondary | ICD-10-CM | POA: Diagnosis not present

## 2020-08-25 DIAGNOSIS — L97222 Non-pressure chronic ulcer of left 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: Secondary | ICD-10-CM | POA: Diagnosis not present

## 2020-08-25 NOTE — Progress Notes (Signed)
[name], [name] ([id number]) Visit Report for 08/25/2020 HPI Details Patient Name: Date of Service: [name] [name]. 08/25/2020 2:00 PM Medical Record Number: [id number] Patient Account Number: [id number] Date of Birth/Sex: Treating RN: 1926/12/17 (86 y.o. [name] [name] Primary Care Provider: Wenda [name] Other Clinician: Referring Provider: Treating Provider/Extender: [name] [name] in Treatment: 3 History of Present Illness HPI Description: ADMISSION 08/03/2020 Patient is a pleasant 86 year old woman accompanied by her granddaughter. She lives on her own but I think there is extensive family support. About 8 weeks ago she was taking off her compression stocking and traumatized the anterior part of her lower extremity on the left. She has seen her primary physician and was initially prescribed Keflex and then a course of clindamycin. She has home health they are applying silver alginate ABDs and kerlix. Not making any progress with the wound and they are here for our review of this. Past medical history includes COPD, rosacea, history of lung cancer status post surgery on the right lung, hypertension, spinal stenosis, compression stocking use fairly reliably. She has advanced home care already ABI on the left in our clinic was 1.19 10/8; patient with a large predominantly venous wound in the left medial lower leg. We used Iodoflex under compression last week. The wound looks slightly better 10/22; 2-week follow-up. She has home health. Largely venous wound in the left medial lower leg slightly smaller we are using Iodoflex under compression and finally have a decent looking wound surface today I changed her to [employer]) Signed: 08/25/2020 4:52:36 PM By: [name] Ham MD Entered By: [name] [name] on 08/25/2020 14:59:08 -------------------------------------------------------------------------------- Physical Exam  Details Patient Name: Date of Service: [name] Starks M. 08/25/2020 2:00 PM Medical Record Number: [id number] Patient Account Number: [id number] Date of Birth/Sex: Treating RN: 07/01/1927 (86 y.o. [name] [name] Primary Care Provider: Wenda [name] Other Clinician: Referring Provider: Treating Provider/Extender: [name] [name] in Treatment: 3 Constitutional Sitting or standing Blood Pressure is within target range for patient.. Pulse regular and within target range for patient.Marland Kitchen Respirations regular, non-labored and within target range.. Temperature is normal and within the target range for the patient.Marland Kitchen Appears in no distress. Notes Wound exam; fairly sizable wound on left anterior lower leg however quite a bit better looking surface. Eschar and debris around the wound circumference removed with pickups and scissors. No surface debridement was felt to be necessary to the area under illumination the surface looks a lot better no evidence of underlying infection. Edema is controlled peripheral pulses are pal Electronic Signature(s) Signed: 08/25/2020 4:52:36 PM By: [name] Ham MD Entered By: [name] [name] on 08/25/2020 15:00:34 -------------------------------------------------------------------------------- Physician Orders Details Patient Name: Date of Service: [name] [name], [name] Ames M. 08/25/2020 2:00 PM Medical Record Number: [id number] Patient Account Number: [id number] Date of Birth/Sex: Treating RN: 14-Nov-1926 (86 y.o. [name] [name] Primary Care Provider: Wenda [name] Other Clinician: Referring Provider: Treating Provider/Extender: [name] [name] in Treatment: 3 Verbal / Phone Orders: No Diagnosis Coding ICD-10 Coding Code Description [phone] Chronic venous hypertension (idiopathic) with ulcer and inflammation of left lower extremity L97.222 Non-pressure chronic ulcer of left 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 Follow-up  Appointments Return Appointment in 2 weeks. Dressing Change Frequency Other: - HH to change twice a week Skin Barriers/Peri-Wound Care Moisturizing lotion - patient to lotion right leg daily. home health to apply lotion with dressing changes to left leg. TCA Cream or Ointment - mixed with lotion to left  leg in clinic. Wound Cleansing May shower with protection. - use cast protector. May shower and wash wound with soap and water. - with dressing changes. Primary Wound Dressing Wound #1 Left,Medial Lower Leg Hydrofera Blue - classic moistened with saline Secondary Dressing Dry Gauze ABD pad Edema Control 3 Layer Compression System - Left Lower Extremity Avoid standing for long periods of time Elevate legs to the level of the heart or above for 30 minutes daily and/or when sitting, a frequency of: - throughout the day 3 to 4 times a day. Exercise regularly Support Garment 20-30 mm/Hg pressure to: - wear stocking on right leg. apply in the morning and remove at night. [place] skilled nursing for wound care. - [facility]. Electronic Signature(s) Signed: 08/25/2020 4:52:36 PM By: [name] Ham MD Signed: 08/25/2020 5:21:31 PM By: Baruch Gouty RN, BSN Entered By: Baruch Gouty on 08/25/2020 14:55:59 -------------------------------------------------------------------------------- Problem List Details Patient Name: Date of Service: [name] [name], [name] Ames M. 08/25/2020 2:00 PM Medical Record Number: [id number] Patient Account Number: [id number] Date of Birth/Sex: Treating RN: 17-Mar-1927 (86 y.o. [name] [name] Primary Care Provider: Wenda [name] Other Clinician: Referring Provider: Treating Provider/Extender: [name] [name] in Treatment: 3 Active Problems ICD-10 Encounter Code Description Active Date MDM Diagnosis I87.332 Chronic venous hypertension (idiopathic) with ulcer and inflammation of left 08/03/2020 No  Yes lower extremity L97.222 Non-pressure chronic ulcer of left 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 08/03/2020 No Yes Inactive Problems Resolved Problems Electronic Signature(s) Signed: 08/25/2020 4:52:36 PM By: [name] Ham MD Entered By: [name] [name] on 08/25/2020 14:58:12 -------------------------------------------------------------------------------- Progress Note Details Patient Name: Date of Service: [name] [name], [name] Ames M. 08/25/2020 2:00 PM Medical Record Number: [id number] Patient Account Number: [id number] Date of Birth/Sex: Treating RN: [birth year] (86 y.o. [name] [name] Primary Care Provider: Wenda [name] Other Clinician: Referring Provider: Treating Provider/Extender: [name] [name] in Treatment: 3 Subjective History of Present Illness (HPI) ADMISSION 08/03/2020 Patient is a pleasant 86 year old woman accompanied by her granddaughter. She lives on her own but I think there is extensive family support. About 8 weeks ago she was taking off her compression stocking and traumatized the anterior part of her lower extremity on the left. She has seen her primary physician and was initially prescribed Keflex and then a course of clindamycin. She has home health they are applying silver alginate ABDs and kerlix. Not making any progress with the wound and they are here for our review of this. Past medical history includes COPD, rosacea, history of lung cancer status post surgery on the right lung, hypertension, spinal stenosis, compression stocking use fairly reliably. She has advanced home care already ABI on the left in our clinic was 1.19 10/8; patient with a large predominantly venous wound in the left medial lower leg. We used Iodoflex under compression last week. The wound looks slightly better 10/22; 2-week follow-up. She has home health. Largely venous wound in the left medial lower leg slightly smaller we are using Iodoflex under compression and finally  have a decent looking wound surface today I changed her to Hydrofera Blue Objective Constitutional Sitting or standing Blood Pressure is within target range for patient.. Pulse regular and within target range for patient.Marland Kitchen Respirations regular, non-labored and within target range.. Temperature is normal and within the target range for the patient.Marland Kitchen Appears in no distress. Vitals Time Taken: 2:14 PM, Height: 61 in, Weight: 155 lbs, BMI: 29.3, Temperature: 98.2 F, Pulse: 70 bpm, Respiratory Rate: 18 breaths/min, Blood  Pressure: 147/65 mmHg. General Notes: Wound exam; fairly sizable wound on left anterior lower leg however quite a bit better looking surface. Eschar and debris around the wound circumference removed with pickups and scissors. No surface debridement was felt to be necessary to the area under illumination the surface looks a lot better no evidence of underlying infection. Edema is controlled peripheral pulses are pal Integumentary (Hair, Skin) Wound #1 status is Open. Original cause of wound was Blister. The wound is located on the Left,Medial Lower Leg. The wound measures 6cm length x 2.5cm width x 0.2cm depth; 11.781cm^2 area and 2.356cm^3 volume. There is Fat Layer (Subcutaneous Tissue) exposed. There is no tunneling or undermining noted. There is a large amount of serous drainage noted. The wound margin is distinct with the outline attached to the wound base. There is large (67-100%) red, pink granulation within the wound bed. There is a small (1-33%) amount of necrotic tissue within the wound bed including Adherent Slough. Assessment Active Problems ICD-10 Chronic venous hypertension (idiopathic) with ulcer and inflammation of left lower extremity Non-pressure chronic ulcer of left 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 Procedures Wound #1 Pre-procedure diagnosis of Wound #1 is a Venous Leg Ulcer located on the Left,Medial Lower Leg . There was a Three Layer Compression Therapy  Procedure by Deon Pilling, RN. Post procedure Diagnosis Wound #1: Same as Pre-Procedure Plan Follow-up Appointments: Return Appointment in 2 weeks. Dressing Change Frequency: Other: - HH to change twice a week Skin Barriers/Peri-Wound Care: Moisturizing lotion - patient to lotion right leg daily. home health to apply lotion with dressing changes to left leg. TCA Cream or Ointment - mixed with lotion to left leg in clinic. Wound Cleansing: May shower with protection. - use cast protector. May shower and wash wound with soap and water. - with dressing changes. Primary Wound Dressing: Wound #1 Left,Medial Lower Leg: Hydrofera Blue - classic moistened with saline Secondary Dressing: Dry Gauze ABD pad Edema Control: 3 Layer Compression System - Left Lower Extremity Avoid standing for long periods of time Elevate legs to the level of the heart or above for 30 minutes daily and/or when sitting, a frequency of: - throughout the day 3 to 4 times a day. Exercise regularly Support Garment 20-30 mm/Hg pressure to: - wear stocking on right leg. apply in the morning and remove at night. Home Health: [place] skilled nursing for wound care. - [facility]. 1. Continue with Hydrofera Blue today under 3 layer compression to replace Iodoflex 2. Hopefully better epithelialization when we see her next time Electronic Signature(s) Signed: 08/25/2020 4:52:36 PM By: [name] Ham MD Entered By: [name] [name] on 08/25/2020 15:01:01 -------------------------------------------------------------------------------- SuperBill Details Patient Name: Date of Service: [name] [name], [name] Ames M. 08/25/2020 Medical Record Number: [id number] Patient Account Number: [id number] Date of Birth/Sex: Treating RN: 11-01-1927 (86 y.o. [name] [name] Primary Care Provider: Wenda [name] Other Clinician: Referring Provider: Treating Provider/Extender: [name] [name] in  Treatment: 3 Diagnosis Coding ICD-10 Codes Code Description [phone] Chronic venous hypertension (idiopathic) with ulcer and inflammation of left lower extremity L97.222 Non-pressure chronic ulcer of left 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 Facility Procedures CPT4 Code: [id number] Description: (Facility Use Only) [id number] - [facility] COMPRS LWR LT LEG Modifier: Quantity: 1 Physician Procedures : CPT4 Code Description Modifier [id number] - WC PHYS LEVEL 3 - EST PT ICD-10 Diagnosis Description I87.332 Chronic venous hypertension (idiopathic) with ulcer and inflammation of left lower extremity L97.222 Non-pressure chronic ulcer of left calf  with fat layer exposed Quantity: 1 Electronic Signature(s) Signed: 08/25/2020 4:52:36 PM By: [name] Ham MD Entered By: [name] [name] on 08/25/2020 15:01:16

## 2020-08-25 NOTE — Progress Notes (Signed)
[name], [name] ([id number]) Visit Report for 08/25/2020 Arrival Information Details Patient Name: Date of Service: [name] [name] [place]. 08/25/2020 2:00 PM Medical Record Number: [id number] Patient Account Number: [id number] Date of Birth/Sex: Treating RN: 1927/03/10 (86 y.o. [name] Malay, [name] Primary Care Marshia Tropea: Wenda Low Other Clinician: Referring Daysi Boggan: Treating Susana Gripp/Extender: Bryon Lions in Treatment: 3 Visit Information History Since Last Visit Added or deleted any medications: No Patient Arrived: Wheel Chair Any new allergies or adverse reactions: No Arrival Time: 14:14 Had a fall or experienced change in No Accompanied By: granddaughter activities of daily living that may affect Transfer Assistance: None risk of falls: Patient Identification Verified: Yes Signs or symptoms of abuse/neglect since last visito No Secondary Verification Process Completed: Yes Hospitalized since last visit: No Implantable device outside of the clinic excluding No cellular tissue based products placed in the center since last visit: Has Dressing in Place as Prescribed: Yes Pain Present Now: No Electronic Signature(s) Signed: 08/25/2020 4:07:18 PM By: Sandre Kitty Entered By: Sandre Kitty on 08/25/2020 14:14:58 -------------------------------------------------------------------------------- Compression Therapy Details Patient Name: Date of Service: [name] Starks M. 08/25/2020 2:00 PM Medical Record Number: [id number] Patient Account Number: [id number] Date of Birth/Sex: Treating RN: 21-Jun-1927 (86 y.o. Elam Dutch Primary Care Cledis Sohn: Wenda Low Other Clinician: Referring Iridessa Harrow: Treating Alajah Witman/Extender: Bryon Lions in Treatment: 3 Compression Therapy Performed for Wound Assessment: Wound #1 Left,Medial Lower Leg Performed By: Clinician Deon Pilling, RN Compression Type: Three Layer Post  Procedure Diagnosis Same as Pre-procedure Electronic Signature(s) Signed: 08/25/2020 5:21:31 PM By: Baruch Gouty RN, BSN Entered By: Baruch Gouty on 08/25/2020 14:53:51 -------------------------------------------------------------------------------- Encounter Discharge Information Details Patient Name: Date of Service: [name] [name], [name] Ames M. 08/25/2020 2:00 PM Medical Record Number: [id number] Patient Account Number: [id number] Date of Birth/Sex: Treating RN: 27-Jul-1927 (86 y.o. [name] Primary Care Dot Splinter: Wenda Low Other Clinician: Referring Jveon Pound: Treating Matasha Smigelski/Extender: Bryon Lions in Treatment: 3 Encounter Discharge Information Items Discharge Condition: Stable Ambulatory Status: Wheelchair Discharge Destination: Home Transportation: Private Auto Accompanied By: family member Schedule Follow-up Appointment: Yes Clinical Summary of Care: Electronic Signature(s) Signed: 08/25/2020 5:18:39 PM By: Deon Pilling Entered By: Deon Pilling on 08/25/2020 15:31:32 -------------------------------------------------------------------------------- Lower Extremity Assessment Details Patient Name: Date of Service: [name] [name]. 08/25/2020 2:00 PM Medical Record Number: [id number] Patient Account Number: [id number] Date of Birth/Sex: Treating RN: 1927/03/06 (86 y.o. [name] Primary Care Jadi Deyarmin: Wenda Low Other Clinician: Referring Fenris Cauble: Treating Charlisa Cham/Extender: Bryon Lions in Treatment: 3 Edema Assessment Assessed: Shirlyn Goltz: Yes] Patrice Paradise: No] Edema: [Left: Ye] [Right: s] Calf Left: Right: Point of Measurement: From Medial Instep 32 cm Ankle Left: Right: Point of Measurement: From Medial Instep 20 cm Vascular Assessment Pulses: Dorsalis Pedis Palpable: [Left:Yes] Electronic Signature(s) Signed: 08/25/2020 5:18:39 PM By: Deon Pilling Entered By: Deon Pilling on  08/25/2020 14:37:21 -------------------------------------------------------------------------------- Multi Wound Chart Details Patient Name: Date of Service: [name] [name], [name] Ames M. 08/25/2020 2:00 PM Medical Record Number: [id number] Patient Account Number: [id number] Date of Birth/Sex: Treating RN: Oct 19, 1927 (86 y.o. Elam Dutch Primary Care Emric Kowalewski: Wenda Low Other Clinician: Referring Kilie Rund: Treating Trey Bebee/Extender: Bryon Lions in Treatment: 3 Vital Signs Height(in): 61 Pulse(bpm): 29 Weight(lbs): 155 Blood Pressure(mmHg): 147/65 Body Mass Index(BMI): 29 Temperature(F): 98.2 Respiratory Rate(breaths/min): 18 Photos: [1:No Photos Left, Medial Lower Leg] [N/A:N/A N/A] Wound Location: [1:Blister] [N/A:N/A] Wounding Event: [1:Venous Leg Ulcer] [N/A:N/A] Primary Etiology: [1:Asthma, Chronic Obstructive] [N/A:N/A] Comorbid History: [1:Pulmonary  Disease (COPD), Hypertension, Received Chemotherapy 06/19/2020] [N/A:N/A] Date Acquired: [1:3] [N/A:N/A] Weeks of Treatment: [1:Open] [N/A:N/A] Wound Status: [1:6x2.5x0.2] [N/A:N/A] Measurements L x W x D (cm) [1:11.781] [N/A:N/A] A (cm) : rea [1:2.356] [N/A:N/A] Volume (cm) : [1:50.20%] [N/A:N/A] % Reduction in Area: [1:0.50%] [N/A:N/A] % Reduction in Volume: [1:Full Thickness Without Exposed] [N/A:N/A] Classification: [1:Support Structures Large] [N/A:N/A] Exudate Amount: [1:Serous] [N/A:N/A] Exudate Type: [1:amber] [N/A:N/A] Exudate Color: [1:Distinct, outline attached] [N/A:N/A] Wound Margin: [1:Large (67-100%)] [N/A:N/A] Granulation Amount: [1:Red, Pink] [N/A:N/A] Granulation Quality: [1:Small (1-33%)] [N/A:N/A] Necrotic Amount: [1:Fat Layer (Subcutaneous Tissue): Yes N/A] Exposed Structures: [1:Fascia: No Tendon: No Muscle: No Joint: No Bone: No None] [N/A:N/A] Epithelialization: [1:Compression Therapy] [N/A:N/A] Treatment Notes Electronic Signature(s) Signed: 08/25/2020 4:52:36  PM By: Linton Ham MD Signed: 08/25/2020 5:21:31 PM By: Baruch Gouty RN, BSN Entered By: Linton Ham on 08/25/2020 14:58:17 -------------------------------------------------------------------------------- [place] Details Patient Name: Date of Service: [name] [name], [name] Ames M. 08/25/2020 2:00 PM Medical Record Number: [id number] Patient Account Number: [id number] Date of Birth/Sex: Treating RN: 11-18-1926 (86 y.o. Elam Dutch Primary Care Uliana Brinker: Wenda Low Other Clinician: Referring Lore Polka: Treating Devlon Dosher/Extender: Bryon Lions in Treatment: 3 Active Inactive Venous Leg Ulcer Nursing Diagnoses: Knowledge deficit related to disease process and management Potential for venous Insuffiency (use before diagnosis confirmed) Goals: Patient will maintain optimal edema control Date Initiated: 08/25/2020 Target Resolution Date: 09/22/2020 Goal Status: Active Interventions: Assess peripheral edema status every visit. Compression as ordered Provide education on venous insufficiency Treatment Activities: Therapeutic compression applied : 08/25/2020 Notes: Wound/Skin Impairment Nursing Diagnoses: Knowledge deficit related to ulceration/compromised skin integrity Goals: Patient/caregiver will verbalize understanding of skin care regimen Date Initiated: 08/03/2020 Target Resolution Date: 09/08/2020 Goal Status: Active Interventions: Assess patient/caregiver ability to perform ulcer/skin care regimen upon admission and as needed Provide education on ulcer and skin care Treatment Activities: Skin care regimen initiated : 08/03/2020 Topical wound management initiated : 08/03/2020 Notes: Electronic Signature(s) Signed: 08/25/2020 5:21:31 PM By: Baruch Gouty RN, BSN Entered By: Baruch Gouty on 08/25/2020 14:52:14 -------------------------------------------------------------------------------- Pain Assessment  Details Patient Name: Date of Service: [name] Starks M. 08/25/2020 2:00 PM Medical Record Number: [id number] Patient Account Number: [id number] Date of Birth/Sex: Treating RN: Jul 26, 1927 (86 y.o. Elam Dutch Primary Care Zitlali Primm: Wenda Low Other Clinician: Referring [name] [name]: Treating Amora Sheehy/Extender: Bryon Lions in Treatment: 3 Active Problems Location of Pain Severity and Description of Pain Patient Has Paino No Site Locations Pain Management and Medication Current Pain Management: Electronic Signature(s) Signed: 08/25/2020 4:07:18 PM By: Sandre Kitty Signed: 08/25/2020 5:21:31 PM By: Baruch Gouty RN, BSN Entered By: Sandre Kitty on 08/25/2020 14:15:18 -------------------------------------------------------------------------------- Patient/Caregiver Education Details Patient Name: Date of Service: [name] [name] 10/22/2021andnbsp2:00 PM Medical Record Number: [id number] Patient Account Number: [id number] Date of Birth/Gender: Treating RN: [birth year] (86 y.o. Elam Dutch Primary Care Physician: Wenda Low Other Clinician: Referring Physician: Treating Physician/Extender: Bryon Lions in Treatment: 3 Education Assessment Education Provided To: Patient Education Topics Provided Venous: Methods: Explain/Verbal Responses: Reinforcements needed, State content correctly Wound/Skin Impairment: Methods: Explain/Verbal Responses: Reinforcements needed, State content correctly Electronic Signature(s) Signed: 08/25/2020 5:21:31 PM By: Baruch Gouty RN, BSN Entered By: Baruch Gouty on 08/25/2020 14:52:36 -------------------------------------------------------------------------------- Wound Assessment Details Patient Name: Date of Service: [name] Starks M. 08/25/2020 2:00 PM Medical Record Number: [id number] Patient Account Number: [id number] Date of Birth/Sex: Treating  RN: Jul 26, 1927 (86 y.o. Elam Dutch Primary Care Sharleen Szczesny: Wenda Low Other Clinician: Referring Patricio Popwell: Treating Hillery Bhalla/Extender: Dellia Nims  Rubin Payor, Karrar Weeks in Treatment: 3 Wound Status Wound Number: 1 Primary Venous Leg Ulcer Etiology: Wound Location: Left, Medial Lower Leg Wound Open Wounding Event: Blister Status: Date Acquired: 06/19/2020 Comorbid Asthma, Chronic Obstructive Pulmonary Disease (COPD), Weeks Of Treatment: 3 History: Hypertension, Received Chemotherapy Clustered Wound: No Wound Measurements Length: (cm) 6 Width: (cm) 2.5 Depth: (cm) 0.2 Area: (cm) 11.781 Volume: (cm) 2.356 % Reduction in Area: 50.2% % Reduction in Volume: 0.5% Epithelialization: None Tunneling: No Undermining: No Wound Description Classification: Full Thickness Without Exposed Support Structures Wound Margin: Distinct, outline attached Exudate Amount: Large Exudate Type: Serous Exudate Color: amber Foul Odor After Cleansing: No Slough/Fibrino Yes Wound Bed Granulation Amount: Large (67-100%) Exposed Structure Granulation Quality: Red, Pink Fascia Exposed: No Necrotic Amount: Small (1-33%) Fat Layer (Subcutaneous Tissue) Exposed: Yes Necrotic Quality: Adherent Slough Tendon Exposed: No Muscle Exposed: No Joint Exposed: No Bone Exposed: No Treatment Notes Wound #1 (Left, Medial Lower Leg) 1. Cleanse With Wound Cleanser Soap and water 2. Periwound Care Moisturizing lotion 3. Primary Dressing Applied Hydrofera Blue 4. Secondary Dressing ABD Pad Dry Gauze 6. Support Layer Applied 3 layer compression wrap Notes netting. Electronic Signature(s) Signed: 08/25/2020 5:18:39 PM By: Deon Pilling Signed: 08/25/2020 5:21:31 PM By: Baruch Gouty RN, BSN Entered By: Deon Pilling on 08/25/2020 14:39:30 -------------------------------------------------------------------------------- Vitals Details Patient Name: Date of Service: [name] Humphrey, MA RGIE M.  08/25/2020 2:00 PM Medical Record Number: [id number] Patient Account Number: [id number] Date of Birth/Sex: Treating RN: 1927/09/21 (86 y.o. Elam Dutch Primary Care Holten Spano: Wenda Low Other Clinician: Referring Anneth Brunell: Treating Elye Harmsen/Extender: Bryon Lions in Treatment: 3 Vital Signs Time Taken: 14:14 Temperature (F): 98.2 Height (in): 61 Pulse (bpm): 70 Weight (lbs): 155 Respiratory Rate (breaths/min): 18 Body Mass Index (BMI): 29.3 Blood Pressure (mmHg): 147/65 Reference Range: 80 - 120 mg / dl Electronic Signature(s) Signed: 08/25/2020 4:07:18 PM By: Sandre Kitty Entered By: Sandre Kitty on 08/25/2020 14:15:13

## 2020-08-28 ENCOUNTER — Ambulatory Visit: Payer: Medicare Other | Admitting: Internal Medicine

## 2020-09-01 DIAGNOSIS — I129 Hypertensive chronic kidney disease with stage 1 through stage 4 chronic kidney disease, or unspecified chronic kidney disease: Secondary | ICD-10-CM | POA: Diagnosis not present

## 2020-09-01 DIAGNOSIS — J449 Chronic obstructive pulmonary disease, unspecified: Secondary | ICD-10-CM | POA: Diagnosis not present

## 2020-09-01 DIAGNOSIS — N183 Chronic kidney disease, stage 3 unspecified: Secondary | ICD-10-CM | POA: Diagnosis not present

## 2020-09-01 DIAGNOSIS — L03116 Cellulitis of left lower limb: Secondary | ICD-10-CM | POA: Diagnosis not present

## 2020-09-01 DIAGNOSIS — S81802D Unspecified open wound, left lower leg, subsequent encounter: Secondary | ICD-10-CM | POA: Diagnosis not present

## 2020-09-01 DIAGNOSIS — E785 Hyperlipidemia, unspecified: Secondary | ICD-10-CM | POA: Diagnosis not present

## 2020-09-06 DIAGNOSIS — Z4800 Encounter for change or removal of nonsurgical wound dressing: Secondary | ICD-10-CM | POA: Diagnosis not present

## 2020-09-06 DIAGNOSIS — M4800 Spinal stenosis, site unspecified: Secondary | ICD-10-CM | POA: Diagnosis not present

## 2020-09-06 DIAGNOSIS — S81802D Unspecified open wound, left lower leg, subsequent encounter: Secondary | ICD-10-CM | POA: Diagnosis not present

## 2020-09-06 DIAGNOSIS — M7542 Impingement syndrome of left shoulder: Secondary | ICD-10-CM | POA: Diagnosis not present

## 2020-09-06 DIAGNOSIS — G4700 Insomnia, unspecified: Secondary | ICD-10-CM | POA: Diagnosis not present

## 2020-09-06 DIAGNOSIS — Z85118 Personal history of other malignant neoplasm of bronchus and lung: Secondary | ICD-10-CM | POA: Diagnosis not present

## 2020-09-06 DIAGNOSIS — Z902 Acquired absence of lung [part of]: Secondary | ICD-10-CM | POA: Diagnosis not present

## 2020-09-06 DIAGNOSIS — K219 Gastro-esophageal reflux disease without esophagitis: Secondary | ICD-10-CM | POA: Diagnosis not present

## 2020-09-06 DIAGNOSIS — I87309 Chronic venous hypertension (idiopathic) without complications of unspecified lower extremity: Secondary | ICD-10-CM | POA: Diagnosis not present

## 2020-09-06 DIAGNOSIS — J449 Chronic obstructive pulmonary disease, unspecified: Secondary | ICD-10-CM | POA: Diagnosis not present

## 2020-09-06 DIAGNOSIS — I058 Other rheumatic mitral valve diseases: Secondary | ICD-10-CM | POA: Diagnosis not present

## 2020-09-06 DIAGNOSIS — Z8781 Personal history of (healed) traumatic fracture: Secondary | ICD-10-CM | POA: Diagnosis not present

## 2020-09-06 DIAGNOSIS — M810 Age-related osteoporosis without current pathological fracture: Secondary | ICD-10-CM | POA: Diagnosis not present

## 2020-09-06 DIAGNOSIS — M8580 Other specified disorders of bone density and structure, unspecified site: Secondary | ICD-10-CM | POA: Diagnosis not present

## 2020-09-06 DIAGNOSIS — M1990 Unspecified osteoarthritis, unspecified site: Secondary | ICD-10-CM | POA: Diagnosis not present

## 2020-09-06 DIAGNOSIS — N183 Chronic kidney disease, stage 3 unspecified: Secondary | ICD-10-CM | POA: Diagnosis not present

## 2020-09-06 DIAGNOSIS — E559 Vitamin D deficiency, unspecified: Secondary | ICD-10-CM | POA: Diagnosis not present

## 2020-09-06 DIAGNOSIS — I129 Hypertensive chronic kidney disease with stage 1 through stage 4 chronic kidney disease, or unspecified chronic kidney disease: Secondary | ICD-10-CM | POA: Diagnosis not present

## 2020-09-06 DIAGNOSIS — J309 Allergic rhinitis, unspecified: Secondary | ICD-10-CM | POA: Diagnosis not present

## 2020-09-06 DIAGNOSIS — Z79891 Long term (current) use of opiate analgesic: Secondary | ICD-10-CM | POA: Diagnosis not present

## 2020-09-06 DIAGNOSIS — Z7951 Long term (current) use of inhaled steroids: Secondary | ICD-10-CM | POA: Diagnosis not present

## 2020-09-06 DIAGNOSIS — M19012 Primary osteoarthritis, left shoulder: Secondary | ICD-10-CM | POA: Diagnosis not present

## 2020-09-06 DIAGNOSIS — Z87891 Personal history of nicotine dependence: Secondary | ICD-10-CM | POA: Diagnosis not present

## 2020-09-06 DIAGNOSIS — L03116 Cellulitis of left lower limb: Secondary | ICD-10-CM | POA: Diagnosis not present

## 2020-09-06 DIAGNOSIS — E785 Hyperlipidemia, unspecified: Secondary | ICD-10-CM | POA: Diagnosis not present

## 2020-09-06 DIAGNOSIS — M5030 Other cervical disc degeneration, unspecified cervical region: Secondary | ICD-10-CM | POA: Diagnosis not present

## 2020-09-06 DIAGNOSIS — Z9181 History of falling: Secondary | ICD-10-CM | POA: Diagnosis not present

## 2020-09-08 ENCOUNTER — Encounter (HOSPITAL_BASED_OUTPATIENT_CLINIC_OR_DEPARTMENT_OTHER): Payer: Medicare Other | Attending: Internal Medicine | Admitting: Internal Medicine

## 2020-09-08 ENCOUNTER — Other Ambulatory Visit: Payer: Self-pay

## 2020-09-08 DIAGNOSIS — J449 Chronic obstructive pulmonary disease, unspecified: Secondary | ICD-10-CM | POA: Insufficient documentation

## 2020-09-08 DIAGNOSIS — L97222 Non-pressure chronic ulcer of left 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: Secondary | ICD-10-CM | POA: Insufficient documentation

## 2020-09-08 DIAGNOSIS — I872 Venous insufficiency (chronic) (peripheral): Secondary | ICD-10-CM | POA: Diagnosis not present

## 2020-09-08 DIAGNOSIS — Z85118 Personal history of other malignant neoplasm of bronchus and lung: Secondary | ICD-10-CM | POA: Diagnosis not present

## 2020-09-08 DIAGNOSIS — I10 Essential (primary) hypertension: Secondary | ICD-10-CM | POA: Insufficient documentation

## 2020-09-08 DIAGNOSIS — L97822 Non-pressure chronic ulcer of other part of left lower leg with fat layer exposed: Secondary | ICD-10-CM | POA: Diagnosis not present

## 2020-09-11 NOTE — Progress Notes (Signed)
[name] [name], [name] [name] ([id number]) Visit Report for 09/08/2020 HPI Details Patient Name: Date of Service: [name] [name] [place]. 09/08/2020 2:00 PM Medical Record Number: [id number] Patient Account Number: [id number] Date of Birth/Sex: Treating RN: 1927/03/24 (86 y.o. Elam Dutch Primary Care Provider: Wenda Low Other Clinician: Referring Provider: Treating Provider/Extender: Bryon Lions in Treatment: 5 History of Present Illness HPI Description: ADMISSION 08/03/2020 Patient is a pleasant 86 year old woman accompanied by her granddaughter. She lives on her own but I think there is extensive family support. About 8 weeks ago she was taking off her compression stocking and traumatized the anterior part of her lower extremity on the left. She has seen her primary physician and was initially prescribed Keflex and then a course of clindamycin. She has home health they are applying silver alginate ABDs and kerlix. Not making any progress with the wound and they are here for our review of this. Past medical history includes COPD, rosacea, history of lung cancer status post surgery on the right lung, hypertension, spinal stenosis, compression stocking use fairly reliably. She has advanced home care already ABI on the left in our clinic was 1.19 10/8; patient with a large predominantly venous wound in the left medial lower leg. We used Iodoflex under compression last week. The wound looks slightly better 10/22; 2-week follow-up. She has home health. Largely venous wound in the left medial lower leg slightly smaller we are using Iodoflex under compression and finally have a decent looking wound surface today I changed her to [facility] 11/5; 2-week follow-up. Venous wound in the left medial lower leg surface area is better. She continues to have decent looking wound bed. She has a skin tear on the left mid tibia area which I think was home health initiated. She  complained about one of the wraps being too tight but are seems to work Pensions consultant) Signed: 09/11/2020 1:46:42 PM By: Linton Ham MD Entered By: Linton Ham on 09/08/2020 15:52:08 -------------------------------------------------------------------------------- Physical Exam Details Patient Name: Date of Service: [name] [name], [name] 09/08/2020 2:00 PM Medical Record Number: [id number] Patient Account Number: [id number] Date of Birth/Sex: Treating RN: 1927/08/19 (86 y.o. Elam Dutch Primary Care Provider: Wenda Low Other Clinician: Referring Provider: Treating Provider/Extender: Bryon Lions in Treatment: 5 Constitutional Sitting or standing Blood Pressure is within target range for patient.. Pulse regular and within target range for patient.Marland Kitchen Respirations regular, non-labored and within target range.. Temperature is normal and within the target range for the patient.Marland Kitchen Appears in no distress. Notes .Wound exam; sizable wound on the left medial lower leg very healthy looking surface no debridement is required. She has a small skin tear on the left mid tibia. Electronic Signature(s) Signed: 09/11/2020 1:46:42 PM By: Linton Ham MD Entered By: Linton Ham on 09/08/2020 15:54:31 -------------------------------------------------------------------------------- Physician Orders Details Patient Name: Date of Service: [name] [name], [name] 09/08/2020 2:00 PM Medical Record Number: [id number] Patient Account Number: [id number] Date of Birth/Sex: Treating RN: 09-03-1927 (86 y.o. Elam Dutch Primary Care Provider: Wenda Low Other Clinician: Referring Provider: Treating Provider/Extender: Bryon Lions in Treatment: 5 Verbal / Phone Orders: No Diagnosis Coding ICD-10 Coding Code Description [phone] Chronic venous hypertension (idiopathic) with ulcer and inflammation of left lower  extremity L97.222 Non-pressure chronic ulcer of left 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 Follow-up Appointments Return Appointment in 2 weeks. Dressing Change Frequency Other: - HH to change twice a week Skin Barriers/Peri-Wound Care Moisturizing lotion - patient  to lotion right leg daily. home health to apply lotion with dressing changes to left leg. TCA Cream or Ointment - mixed with lotion to left leg in clinic. Wound Cleansing May shower with protection. - use cast protector. May shower and wash wound with soap and water. - with dressing changes. Primary Wound Dressing Wound #1 Left,Medial Lower Leg Hydrofera Blue - classic moistened with saline Wound #2 Left,Lateral Lower Leg Hydrofera Blue - classic moistened with saline Secondary Dressing Wound #1 Left,Medial Lower Leg Dry Gauze ABD pad Wound #2 Left,Lateral Lower Leg Dry Gauze Edema Control 3 Layer Compression System - Left Lower Extremity Avoid standing for long periods of time Elevate legs to the level of the heart or above for 30 minutes daily and/or when sitting, a frequency of: - throughout the day 3 to 4 times a day. Exercise regularly Support Garment 20-30 mm/Hg pressure to: - wear stocking on right leg. apply in the morning and remove at night. [place] skilled nursing for wound care. - [facility]. Electronic Signature(s) Signed: 09/08/2020 6:06:29 PM By: Baruch Gouty RN, BSN Signed: 09/11/2020 1:46:42 PM By: Linton Ham MD Entered By: Baruch Gouty on 09/08/2020 15:33:05 -------------------------------------------------------------------------------- Problem List Details Patient Name: Date of Service: [name] [name], [name] 09/08/2020 2:00 PM Medical Record Number: [id number] Patient Account Number: [id number] Date of Birth/Sex: Treating RN: October 19, 1927 (86 y.o. Elam Dutch Primary Care Provider: Wenda Low Other Clinician: Referring Provider: Treating  Provider/Extender: Bryon Lions in Treatment: 5 Active Problems ICD-10 Encounter Code Description Active Date MDM Diagnosis I87.332 Chronic venous hypertension (idiopathic) with ulcer and inflammation of left 08/03/2020 No Yes lower extremity L97.222 Non-pressure chronic ulcer of left 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 08/03/2020 No Yes Inactive Problems Resolved Problems Electronic Signature(s) Signed: 09/11/2020 1:46:42 PM By: Linton Ham MD Entered By: Linton Ham on 09/08/2020 15:50:57 -------------------------------------------------------------------------------- Progress Note Details Patient Name: Date of Service: [name] [name], [name] 09/08/2020 2:00 PM Medical Record Number: [id number] Patient Account Number: [id number] Date of Birth/Sex: Treating RN: [birth year] (86 y.o. Elam Dutch Primary Care Provider: Wenda Low Other Clinician: Referring Provider: Treating Provider/Extender: Bryon Lions in Treatment: 5 Subjective History of Present Illness (HPI) ADMISSION 08/03/2020 Patient is a pleasant 86 year old woman accompanied by her granddaughter. She lives on her own but I think there is extensive family support. About 8 weeks ago she was taking off her compression stocking and traumatized the anterior part of her lower extremity on the left. She has seen her primary physician and was initially prescribed Keflex and then a course of clindamycin. She has home health they are applying silver alginate ABDs and kerlix. Not making any progress with the wound and they are here for our review of this. Past medical history includes COPD, rosacea, history of lung cancer status post surgery on the right lung, hypertension, spinal stenosis, compression stocking use fairly reliably. She has advanced home care already ABI on the left in our clinic was 1.19 10/8; patient with a large predominantly venous wound in the left medial  lower leg. We used Iodoflex under compression last week. The wound looks slightly better 10/22; 2-week follow-up. She has home health. Largely venous wound in the left medial lower leg slightly smaller we are using Iodoflex under compression and finally have a decent looking wound surface today I changed her to [facility] 11/5; 2-week follow-up. Venous wound in the left medial lower leg surface area is better. She continues  to have decent looking wound bed. She has a skin tear on the left mid tibia area which I think was home health initiated. She complained about one of the wraps being too tight but are seems to work well Objective Constitutional Sitting or standing Blood Pressure is within target range for patient.. Pulse regular and within target range for patient.Marland Kitchen Respirations regular, non-labored and within target range.. Temperature is normal and within the target range for the patient.Marland Kitchen Appears in no distress. Vitals Time Taken: 3:59 PM, Height: 61 in, Weight: 155 lbs, BMI: 29.3, Temperature: 97.9 F, Pulse: 60 bpm, Respiratory Rate: 18 breaths/min, Blood Pressure: 131/59 mmHg. General Notes: .Wound exam; sizable wound on the left medial lower leg very healthy looking surface no debridement is required. She has a small skin tear on the left mid tibia. Integumentary (Hair, Skin) Wound #1 status is Open. Original cause of wound was Blister. The wound is located on the Left,Medial Lower Leg. The wound measures 4.9cm length x 1.7cm width x 0.1cm depth; 6.542cm^2 area and 0.654cm^3 volume. There is Fat Layer (Subcutaneous Tissue) exposed. There is no tunneling or undermining noted. There is a medium amount of serous drainage noted. The wound margin is distinct with the outline attached to the wound base. There is large (67-100%) red, pink granulation within the wound bed. There is no necrotic tissue within the wound bed. Wound #2 status is Open. Original cause of wound was Gradually  Appeared. The wound is located on the Left,Lateral Lower Leg. The wound measures 1.4cm length x 0.5cm width x 0.1cm depth; 0.55cm^2 area and 0.055cm^3 volume. There is Fat Layer (Subcutaneous Tissue) exposed. There is no tunneling or undermining noted. There is a small amount of serosanguineous drainage noted. The wound margin is distinct with the outline attached to the wound base. There is large (67-100%) granulation within the wound bed. There is no necrotic tissue within the wound bed. Assessment Active Problems ICD-10 Chronic venous hypertension (idiopathic) with ulcer and inflammation of left lower extremity Non-pressure chronic ulcer of left 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 Procedures Wound #1 Pre-procedure diagnosis of Wound #1 is a Venous Leg Ulcer located on the Left,Medial Lower Leg . There was a Three Layer Compression Therapy Procedure by Deon Pilling, RN. Post procedure Diagnosis Wound #1: Same as Pre-Procedure Wound #2 Pre-procedure diagnosis of Wound #2 is a Skin T located on the Left,Lateral Lower Leg . There was a Three Layer Compression Therapy Procedure by ear Deon Pilling, RN. Post procedure Diagnosis Wound #2: Same as Pre-Procedure Plan Follow-up Appointments: Return Appointment in 2 weeks. Dressing Change Frequency: Other: - HH to change twice a week Skin Barriers/Peri-Wound Care: Moisturizing lotion - patient to lotion right leg daily. home health to apply lotion with dressing changes to left leg. TCA Cream or Ointment - mixed with lotion to left leg in clinic. Wound Cleansing: May shower with protection. - use cast protector. May shower and wash wound with soap and water. - with dressing changes. Primary Wound Dressing: Wound #1 Left,Medial Lower Leg: Hydrofera Blue - classic moistened with saline Wound #2 Left,Lateral Lower Leg: Hydrofera Blue - classic moistened with saline Secondary Dressing: Wound #1 Left,Medial Lower Leg: Dry Gauze ABD pad Wound #2  Left,Lateral Lower Leg: Dry Gauze Edema Control: 3 Layer Compression System - Left Lower Extremity Avoid standing for long periods of time Elevate legs to the level of the heart or above for 30 minutes daily and/or when sitting, a frequency of: - throughout the day 3 to  4 times a day. Exercise regularly Support Garment 20-30 mm/Hg pressure to: - wear stocking on right leg. apply in the morning and remove at night. Home Health: [place] skilled nursing for wound care. - [facility]. 1. Continue with Hydrofera Blue 2. ABD under 3 layer compression 3. Careful attention to the surface area next time. I wonder about a skin sub Electronic Signature(s) Signed: 09/11/2020 1:46:42 PM By: Linton Ham MD Entered By: Linton Ham on 09/08/2020 15:55:27 -------------------------------------------------------------------------------- SuperBill Details Patient Name: Date of Service: [name] [name], [name] 09/08/2020 Medical Record Number: [id number] Patient Account Number: [id number] Date of Birth/Sex: Treating RN: 1927/02/06 (86 y.o. Elam Dutch Primary Care Provider: Wenda Low Other Clinician: Referring Provider: Treating Provider/Extender: Bryon Lions in Treatment: 5 Diagnosis Coding ICD-10 Codes Code Description [phone] Chronic venous hypertension (idiopathic) with ulcer and inflammation of left lower extremity L97.222 Non-pressure chronic ulcer of left 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 Facility Procedures CPT4 Code: [id number] Description: (Facility Use Only) [id number] - [place] LWR LT LEG Modifier: Quantity: 1 Physician Procedures : CPT4 Code Description Modifier [id number] - WC PHYS LEVEL 3 - EST PT ICD-10 Diagnosis Description L97.222 Non-pressure chronic ulcer of left 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 I87.332 Chronic venous hypertension (idiopathic) with ulcer and inflammation of  left lower extremity Quantity: 1 Electronic  Signature(s) Signed: 09/11/2020 1:46:42 PM By: Linton Ham MD Entered By: Linton Ham on 09/08/2020 15:55:44

## 2020-09-11 NOTE — Progress Notes (Signed)
[name] [name], [name] [name] ([id number]) Visit Report for 09/08/2020 Arrival Information Details Patient Name: Date of Service: [name] [name] [place] 09/08/2020 2:00 PM Medical Record Number: [id number] Patient Account Number: [id number] Date of Birth/Sex: Treating RN: 1927/06/02 (86 y.o. Martyn Malay, Linda Primary Care Devontaye Ground: Wenda Low Other Clinician: Referring Jonatha Gagen: Treating Chavy Avera/Extender: Bryon Lions in Treatment: 5 Visit Information History Since Last Visit Added or deleted any medications: No Patient Arrived: Wheel Chair Any new allergies or adverse reactions: No Arrival Time: 14:59 Had a fall or experienced change in No Accompanied By: grand daughter activities of daily living that may affect Transfer Assistance: None risk of falls: Patient Identification Verified: Yes Signs or symptoms of abuse/neglect since last visito No Secondary Verification Process Completed: Yes Hospitalized since last visit: No Implantable device outside of the clinic excluding No cellular tissue based products placed in the center since last visit: Has Dressing in Place as Prescribed: Yes Pain Present Now: No Electronic Signature(s) Signed: 09/11/2020 7:40:36 AM By: Sandre Kitty Entered By: Sandre Kitty on 09/08/2020 14:59:41 -------------------------------------------------------------------------------- Compression Therapy Details Patient Name: Date of Service: [name] Starks M. 09/08/2020 2:00 PM Medical Record Number: [id number] Patient Account Number: [id number] Date of Birth/Sex: Treating RN: Aug 04, 1927 (86 y.o. Elam Dutch Primary Care Tran Randle: Wenda Low Other Clinician: Referring Josiephine Simao: Treating Jazsmine Macari/Extender: Bryon Lions in Treatment: 5 Compression Therapy Performed for Wound Assessment: Wound #1 Left,Medial Lower Leg Performed By: Clinician Deon Pilling, RN Compression Type: Three Layer Post  Procedure Diagnosis Same as Pre-procedure Electronic Signature(s) Signed: 09/08/2020 6:06:29 PM By: Baruch Gouty RN, BSN Entered By: Baruch Gouty on 09/08/2020 15:32:09 -------------------------------------------------------------------------------- Compression Therapy Details Patient Name: Date of Service: [name] [name], [name] Ames M. 09/08/2020 2:00 PM Medical Record Number: [id number] Patient Account Number: [id number] Date of Birth/Sex: Treating RN: 02-12-1927 (86 y.o. Elam Dutch Primary Care Zoriana Oats: Wenda Low Other Clinician: Referring Wally Shevchenko: Treating Keaira Whitehurst/Extender: Bryon Lions in Treatment: 5 Compression Therapy Performed for Wound Assessment: Wound #2 Left,Lateral Lower Leg Performed By: Clinician Deon Pilling, RN Compression Type: Three Layer Post Procedure Diagnosis Same as Pre-procedure Electronic Signature(s) Signed: 09/08/2020 6:06:29 PM By: Baruch Gouty RN, BSN Entered By: Baruch Gouty on 09/08/2020 15:32:09 -------------------------------------------------------------------------------- Encounter Discharge Information Details Patient Name: Date of Service: [name] [name], [name] Ames M. 09/08/2020 2:00 PM Medical Record Number: [id number] Patient Account Number: [id number] Date of Birth/Sex: Treating RN: December 06, 1926 (86 y.o. [name] Primary Care Diann Bangerter: Wenda Low Other Clinician: Referring Cana Mignano: Treating Shaiann Mcmanamon/Extender: Bryon Lions in Treatment: 5 Encounter Discharge Information Items Discharge Condition: Stable Ambulatory Status: Wheelchair Discharge Destination: Home Transportation: Private Auto Accompanied By: self Schedule Follow-up Appointment: Yes Clinical Summary of Care: Electronic Signature(s) Signed: 09/08/2020 5:41:13 PM By: Deon Pilling Entered By: Deon Pilling on 09/08/2020  17:31:04 -------------------------------------------------------------------------------- Lower Extremity Assessment Details Patient Name: Date of Service: [name] [name]. 09/08/2020 2:00 PM Medical Record Number: [id number] Patient Account Number: [id number] Date of Birth/Sex: Treating RN: 04-Jul-1927 (86 y.o. [name] Primary Care Mika Anastasi: Wenda Low Other Clinician: Referring Seaver Machia: Treating Tinika Bucknam/Extender: Bryon Lions in Treatment: 5 Edema Assessment Assessed: Shirlyn Goltz: Yes] Patrice Paradise: No] Edema: [Left: Ye] [Right: s] Calf Left: Right: Point of Measurement: From Medial Instep 30 cm Ankle Left: Right: Point of Measurement: From Medial Instep 20.5 cm Vascular Assessment Pulses: Dorsalis Pedis Palpable: [Left:Yes] Electronic Signature(s) Signed: 09/08/2020 5:41:13 PM By: Deon Pilling Entered By: Deon Pilling on 09/08/2020 15:07:32 -------------------------------------------------------------------------------- Multi Wound Chart  Details Patient Name: Date of Service: [name] [name]. 09/08/2020 2:00 PM Medical Record Number: [id number] Patient Account Number: [id number] Date of Birth/Sex: Treating RN: [birth year] (86 y.o. Martyn Malay, Linda Primary Care Million Maharaj: Wenda Low Other Clinician: Referring Lois Slagel: Treating Kirke Breach/Extender: Bryon Lions in Treatment: 5 Vital Signs Height(in): 61 Pulse(bpm): 60 Weight(lbs): 155 Blood Pressure(mmHg): 131/59 Body Mass Index(BMI): 29 Temperature(F): 97.9 Respiratory Rate(breaths/min): 18 Photos: [1:No Photos Left, Medial Lower Leg] [2:No Photos Left, Lateral Lower Leg] [N/A:N/A N/A] Wound Location: [1:Blister] [2:Gradually Appeared] [N/A:N/A] Wounding Event: [1:Venous Leg Ulcer] [2:Skin T ear] [N/A:N/A] Primary Etiology: [1:Asthma, Chronic Obstructive] [2:Asthma, Chronic Obstructive] [N/A:N/A] Comorbid History: [1:Pulmonary Disease (COPD),  Hypertension, Received Chemotherapy 06/19/2020] [2:Pulmonary Disease (COPD), Hypertension, Received Chemotherapy 09/06/2020] [N/A:N/A] Date Acquired: [1:5] [2:0] [N/A:N/A] Weeks of Treatment: [1:Open] [2:Open] [N/A:N/A] Wound Status: [1:4.9x1.7x0.1] [2:1.4x0.5x0.1] [N/A:N/A] Measurements L x W x D (cm) [1:6.542] [2:0.55] [N/A:N/A] A (cm) : rea [1:0.654] [2:0.055] [N/A:N/A] Volume (cm) : [1:72.40%] [2:0.00%] [N/A:N/A] % Reduction in Area: [1:72.40%] [2:0.00%] [N/A:N/A] % Reduction in Volume: [1:Full Thickness Without Exposed] [2:Full Thickness Without Exposed] [N/A:N/A] Classification: [1:Support Structures Medium] [2:Support Structures Small] [N/A:N/A] Exudate Amount: [1:Serous] [2:Serosanguineous] [N/A:N/A] Exudate Type: [1:amber] [2:red, brown] [N/A:N/A] Exudate Color: [1:Distinct, outline attached] [2:Distinct, outline attached] [N/A:N/A] Wound Margin: [1:Large (67-100%)] [2:Large (67-100%)] [N/A:N/A] Granulation Amount: [1:Red, Pink] [2:N/A] [N/A:N/A] Granulation Quality: [1:None Present (0%)] [2:None Present (0%)] [N/A:N/A] Necrotic Amount: [1:Fat Layer (Subcutaneous Tissue): Yes Fat Layer (Subcutaneous Tissue): Yes N/A] Exposed Structures: [1:Fascia: No Tendon: No Muscle: No Joint: No Bone: No Small (1-33%)] [2:Fascia: No Tendon: No Muscle: No Joint: No Bone: No None] [N/A:N/A] Epithelialization: [1:Compression Therapy] [2:Compression Therapy] [N/A:N/A] Procedures Performed: Treatment Notes Electronic Signature(s) Signed: 09/08/2020 6:06:29 PM By: Baruch Gouty RN, BSN Signed: 09/11/2020 1:46:42 PM By: Linton Ham MD Entered By: Linton Ham on 09/08/2020 15:51:06 -------------------------------------------------------------------------------- [place] Details Patient Name: Date of Service: [name] [name], [name] Ames M. 09/08/2020 2:00 PM Medical Record Number: [id number] Patient Account Number: [id number] Date of Birth/Sex: Treating RN: August 23, 1927 (86 y.o. Elam Dutch Primary Care Javian Nudd: Wenda Low Other Clinician: Referring Padme Arriaga: Treating Stephon Weathers/Extender: Bryon Lions in Treatment: 5 Active Inactive Venous Leg Ulcer Nursing Diagnoses: Knowledge deficit related to disease process and management Potential for venous Insuffiency (use before diagnosis confirmed) Goals: Patient will maintain optimal edema control Date Initiated: 08/25/2020 Target Resolution Date: 09/22/2020 Goal Status: Active Interventions: Assess peripheral edema status every visit. Compression as ordered Provide education on venous insufficiency Treatment Activities: Therapeutic compression applied : 08/25/2020 Notes: Wound/Skin Impairment Nursing Diagnoses: Knowledge deficit related to ulceration/compromised skin integrity Goals: Patient/caregiver will verbalize understanding of skin care regimen Date Initiated: 08/03/2020 Target Resolution Date: 10/06/2020 Goal Status: Active Interventions: Assess patient/caregiver ability to perform ulcer/skin care regimen upon admission and as needed Provide education on ulcer and skin care Treatment Activities: Skin care regimen initiated : 08/03/2020 Topical wound management initiated : 08/03/2020 Notes: Electronic Signature(s) Signed: 09/08/2020 6:06:29 PM By: Baruch Gouty RN, BSN Entered By: Baruch Gouty on 09/08/2020 15:31:11 -------------------------------------------------------------------------------- Pain Assessment Details Patient Name: Date of Service: [name] [name], [name] Ames M. 09/08/2020 2:00 PM Medical Record Number: [id number] Patient Account Number: [id number] Date of Birth/Sex: Treating RN: 1927/06/19 (86 y.o. Elam Dutch Primary Care Deondra Labrador: Wenda Low Other Clinician: Referring Sakiya Stepka: Treating Sandor Arboleda/Extender: Bryon Lions in Treatment: 5 Active Problems Location of Pain Severity and Description of Pain Patient  Has Paino No Site Locations Pain Management and Medication Current Pain Management: Electronic Signature(s) Signed: 09/08/2020  6:06:29 PM By: Baruch Gouty RN, BSN Signed: 09/11/2020 7:40:36 AM By: Sandre Kitty Entered By: Sandre Kitty on 09/08/2020 15:04:24 -------------------------------------------------------------------------------- Patient/Caregiver Education Details Patient Name: Date of Service: [name] [name] 11/5/2021andnbsp2:00 PM Medical Record Number: [id number] Patient Account Number: [id number] Date of Birth/Gender: Treating RN: 06-08-1927 (86 y.o. Elam Dutch Primary Care Physician: Wenda Low Other Clinician: Referring Physician: Treating Physician/Extender: Bryon Lions in Treatment: 5 Education Assessment Education Provided To: Patient Education Topics Provided Venous: Methods: Explain/Verbal Responses: Reinforcements needed, State content correctly Wound/Skin Impairment: Methods: Explain/Verbal Responses: Reinforcements needed, State content correctly Electronic Signature(s) Signed: 09/08/2020 6:06:29 PM By: Baruch Gouty RN, BSN Entered By: Baruch Gouty on 09/08/2020 15:31:33 -------------------------------------------------------------------------------- Wound Assessment Details Patient Name: Date of Service: [name] Starks M. 09/08/2020 2:00 PM Medical Record Number: [id number] Patient Account Number: [id number] Date of Birth/Sex: Treating RN: 1926-11-11 (86 y.o. Martyn Malay, Linda Primary Care Sophiah Rolin: Wenda Low Other Clinician: Referring Nilza Eaker: Treating Sorina Derrig/Extender: Bryon Lions in Treatment: 5 Wound Status Wound Number: 1 Primary Venous Leg Ulcer Etiology: Wound Location: Left, Medial Lower Leg Wound Open Wounding Event: Blister Status: Date Acquired: 06/19/2020 Comorbid Asthma, Chronic Obstructive Pulmonary Disease (COPD), Weeks Of Treatment:  5 History: Hypertension, Received Chemotherapy Clustered Wound: No Wound Measurements Length: (cm) 4.9 Width: (cm) 1.7 Depth: (cm) 0.1 Area: (cm) 6.542 Volume: (cm) 0.654 % Reduction in Area: 72.4% % Reduction in Volume: 72.4% Epithelialization: Small (1-33%) Tunneling: No Undermining: No Wound Description Classification: Full Thickness Without Exposed Support Structures Wound Margin: Distinct, outline attached Exudate Amount: Medium Exudate Type: Serous Exudate Color: amber Foul Odor After Cleansing: No Slough/Fibrino No Wound Bed Granulation Amount: Large (67-100%) Exposed Structure Granulation Quality: Red, Pink Fascia Exposed: No Necrotic Amount: None Present (0%) Fat Layer (Subcutaneous Tissue) Exposed: Yes Tendon Exposed: No Muscle Exposed: No Joint Exposed: No Bone Exposed: No Treatment Notes Wound #1 (Left, Medial Lower Leg) 1. Cleanse With Wound Cleanser Soap and water 2. Periwound Care Moisturizing lotion TCA Cream 3. Primary Dressing Applied Hydrofera Blue 4. Secondary Dressing ABD Pad 6. Support Layer Applied 3 layer compression wrap Notes netting. Electronic Signature(s) Signed: 09/08/2020 5:41:13 PM By: Deon Pilling Signed: 09/08/2020 6:06:29 PM By: Baruch Gouty RN, BSN Entered By: Deon Pilling on 09/08/2020 15:07:44 -------------------------------------------------------------------------------- Wound Assessment Details Patient Name: Date of Service: [name] [name], [name] Ames M. 09/08/2020 2:00 PM Medical Record Number: [id number] Patient Account Number: [id number] Date of Birth/Sex: Treating RN: 1927/05/19 (86 y.o. Martyn Malay, Linda Primary Care Sharone Almond: Wenda Low Other Clinician: Referring Anaria Kroner: Treating Infant Zink/Extender: Bryon Lions in Treatment: 5 Wound Status Wound Number: 2 Primary Skin Tear Etiology: Wound Location: Left, Lateral Lower Leg Wound Open Wounding Event: Gradually  Appeared Status: Date Acquired: 09/06/2020 Comorbid Asthma, Chronic Obstructive Pulmonary Disease (COPD), Weeks Of Treatment: 0 History: Hypertension, Received Chemotherapy Clustered Wound: No Wound Measurements Length: (cm) 1.4 Width: (cm) 0.5 Depth: (cm) 0.1 Area: (cm) 0.55 Volume: (cm) 0.055 % Reduction in Area: 0% % Reduction in Volume: 0% Epithelialization: None Tunneling: No Undermining: No Wound Description Classification: Full Thickness Without Exposed Support Structures Wound Margin: Distinct, outline attached Exudate Amount: Small Exudate Type: Serosanguineous Exudate Color: red, brown Foul Odor After Cleansing: No Slough/Fibrino No Wound Bed Granulation Amount: Large (67-100%) Exposed Structure Necrotic Amount: None Present (0%) Fascia Exposed: No Fat Layer (Subcutaneous Tissue) Exposed: Yes Tendon Exposed: No Muscle Exposed: No Joint Exposed: No Bone Exposed: No Treatment Notes Wound #2 (Left, Lateral Lower Leg) 1. Cleanse With Wound  Cleanser Soap and water 2. Periwound Care Moisturizing lotion TCA Cream 3. Primary Dressing Applied Hydrofera Blue 4. Secondary Dressing ABD Pad 6. Support Layer Applied 3 layer compression wrap Notes netting. Electronic Signature(s) Signed: 09/08/2020 5:41:13 PM By: Deon Pilling Signed: 09/08/2020 6:06:29 PM By: Baruch Gouty RN, BSN Entered By: Deon Pilling on 09/08/2020 15:08:13 -------------------------------------------------------------------------------- Vitals Details Patient Name: Date of Service: [name] Humphrey, MA RGIE M. 09/08/2020 2:00 PM Medical Record Number: [id number] Patient Account Number: [id number] Date of Birth/Sex: Treating RN: 1927/10/10 (86 y.o. Elam Dutch Primary Care Makale Pindell: Wenda Low Other Clinician: Referring Sarahelizabeth Conway: Treating Daylani Deblois/Extender: Bryon Lions in Treatment: 5 Vital Signs Time Taken: 15:59 Temperature (F): 97.9 Height (in):  61 Pulse (bpm): 60 Weight (lbs): 155 Respiratory Rate (breaths/min): 18 Body Mass Index (BMI): 29.3 Blood Pressure (mmHg): 131/59 Reference Range: 80 - 120 mg / dl Electronic Signature(s) Signed: 09/11/2020 7:40:36 AM By: Sandre Kitty Entered By: Sandre Kitty on 09/08/2020 15:04:02

## 2020-09-12 DIAGNOSIS — J449 Chronic obstructive pulmonary disease, unspecified: Secondary | ICD-10-CM | POA: Diagnosis not present

## 2020-09-12 DIAGNOSIS — E785 Hyperlipidemia, unspecified: Secondary | ICD-10-CM | POA: Diagnosis not present

## 2020-09-12 DIAGNOSIS — I129 Hypertensive chronic kidney disease with stage 1 through stage 4 chronic kidney disease, or unspecified chronic kidney disease: Secondary | ICD-10-CM | POA: Diagnosis not present

## 2020-09-12 DIAGNOSIS — N183 Chronic kidney disease, stage 3 unspecified: Secondary | ICD-10-CM | POA: Diagnosis not present

## 2020-09-12 DIAGNOSIS — I87309 Chronic venous hypertension (idiopathic) without complications of unspecified lower extremity: Secondary | ICD-10-CM | POA: Diagnosis not present

## 2020-09-12 DIAGNOSIS — S81802D Unspecified open wound, left lower leg, subsequent encounter: Secondary | ICD-10-CM | POA: Diagnosis not present

## 2020-09-14 DIAGNOSIS — E785 Hyperlipidemia, unspecified: Secondary | ICD-10-CM | POA: Diagnosis not present

## 2020-09-14 DIAGNOSIS — J449 Chronic obstructive pulmonary disease, unspecified: Secondary | ICD-10-CM | POA: Diagnosis not present

## 2020-09-14 DIAGNOSIS — I87309 Chronic venous hypertension (idiopathic) without complications of unspecified lower extremity: Secondary | ICD-10-CM | POA: Diagnosis not present

## 2020-09-14 DIAGNOSIS — S81802D Unspecified open wound, left lower leg, subsequent encounter: Secondary | ICD-10-CM | POA: Diagnosis not present

## 2020-09-14 DIAGNOSIS — N183 Chronic kidney disease, stage 3 unspecified: Secondary | ICD-10-CM | POA: Diagnosis not present

## 2020-09-14 DIAGNOSIS — I129 Hypertensive chronic kidney disease with stage 1 through stage 4 chronic kidney disease, or unspecified chronic kidney disease: Secondary | ICD-10-CM | POA: Diagnosis not present

## 2020-09-18 DIAGNOSIS — I87309 Chronic venous hypertension (idiopathic) without complications of unspecified lower extremity: Secondary | ICD-10-CM | POA: Diagnosis not present

## 2020-09-18 DIAGNOSIS — S81802D Unspecified open wound, left lower leg, subsequent encounter: Secondary | ICD-10-CM | POA: Diagnosis not present

## 2020-09-18 DIAGNOSIS — E785 Hyperlipidemia, unspecified: Secondary | ICD-10-CM | POA: Diagnosis not present

## 2020-09-18 DIAGNOSIS — J449 Chronic obstructive pulmonary disease, unspecified: Secondary | ICD-10-CM | POA: Diagnosis not present

## 2020-09-18 DIAGNOSIS — I129 Hypertensive chronic kidney disease with stage 1 through stage 4 chronic kidney disease, or unspecified chronic kidney disease: Secondary | ICD-10-CM | POA: Diagnosis not present

## 2020-09-18 DIAGNOSIS — N183 Chronic kidney disease, stage 3 unspecified: Secondary | ICD-10-CM | POA: Diagnosis not present

## 2020-09-21 ENCOUNTER — Encounter (HOSPITAL_BASED_OUTPATIENT_CLINIC_OR_DEPARTMENT_OTHER): Payer: Medicare Other | Admitting: Internal Medicine

## 2020-09-21 ENCOUNTER — Other Ambulatory Visit: Payer: Self-pay

## 2020-09-21 DIAGNOSIS — L97222 Non-pressure chronic ulcer of left 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: Secondary | ICD-10-CM | POA: Diagnosis not present

## 2020-09-21 DIAGNOSIS — J449 Chronic obstructive pulmonary disease, unspecified: Secondary | ICD-10-CM | POA: Diagnosis not present

## 2020-09-21 DIAGNOSIS — Z85118 Personal history of other malignant neoplasm of bronchus and lung: Secondary | ICD-10-CM | POA: Diagnosis not present

## 2020-09-21 DIAGNOSIS — I87332 Chronic venous hypertension (idiopathic) with ulcer and inflammation of left lower extremity: Secondary | ICD-10-CM | POA: Diagnosis not present

## 2020-09-21 DIAGNOSIS — I10 Essential (primary) hypertension: Secondary | ICD-10-CM | POA: Diagnosis not present

## 2020-09-21 NOTE — Progress Notes (Signed)
[name], [name] ([id number]) Visit Report for 09/21/2020 HPI Details Patient Name: Date of Service: [name] [name]. 09/21/2020 12:30 PM Medical Record Number: [id number] Patient Account Number: [id number] Date of Birth/Sex: Treating RN: Oct 19, 1927 (86 y.o. [name] Primary Care Provider: Wenda Low Other Clinician: Referring Provider: Treating Provider/Extender: Bryon Lions in Treatment: 7 History of Present Illness HPI Description: ADMISSION 08/03/2020 Patient is a pleasant 86 year old woman accompanied by her granddaughter. She lives on her own but I think there is extensive family support. About 8 weeks ago she was taking off her compression stocking and traumatized the anterior part of her lower extremity on the left. She has seen her primary physician and was initially prescribed Keflex and then a course of clindamycin. She has home health they are applying silver alginate ABDs and kerlix. Not making any progress with the wound and they are here for our review of this. Past medical history includes COPD, rosacea, history of lung cancer status post surgery on the right lung, hypertension, spinal stenosis, compression stocking use fairly reliably. She has advanced home [facility] already ABI on the left in our clinic was 1.19 10/8; patient with a large predominantly venous wound in the left medial lower leg. We used Iodoflex under compression last week. The wound looks slightly better 10/22; 2-week follow-up. She has home health. Largely venous wound in the left medial lower leg slightly smaller we are using Iodoflex under compression and finally have a decent looking wound surface today I changed her to [facility] 11/5; 2-week follow-up. Venous wound in the left medial lower leg surface area is better. She continues to have decent looking wound bed. She has a skin tear on the left mid tibia area which I think was home health initiated. She  complained about one of the wraps being too tight but are seems to work well 11/18; 2-week follow-up. Venous wounds in the left medial lower leg. This is come down in size were using Hydrofera Blue under 3 layer compression. The anterior trauma from last time she was here is closed over. She is concerned about an area on the right medial lower leg but I do not see anything open here. She does have very frail skin however Electronic Signature(s) Signed: 09/21/2020 5:14:03 PM By: Linton Ham MD Entered By: Linton Ham on 09/21/2020 13:22:37 -------------------------------------------------------------------------------- Physical Exam Details Patient Name: Date of Service: [name] 09/21/2020 12:30 PM Medical Record Number: [id number] Patient Account Number: [id number] Date of Birth/Sex: Treating RN: 1927/06/09 (86 y.o. [name] Primary Care Provider: Wenda Low Other Clinician: Referring Provider: Treating Provider/Extender: Bryon Lions in Treatment: 7 Constitutional Sitting or standing Blood Pressure is within target range for patient.. Pulse regular and within target range for patient.Marland Kitchen Respirations regular, non-labored and within target range.. Temperature is normal and within the target range for the patient.Marland Kitchen Appears in no distress. Cardiovascular Pedal pulses palpable and strong bilaterally.. Good edema control. Notes Wound exam; sizable wound on the left medial lower leg. This is come down nicely in length. Tissue looks healthy under illumination no debridement is required Electronic Signature(s) Signed: 09/21/2020 5:14:03 PM By: Linton Ham MD Entered By: Linton Ham on 09/21/2020 13:23:38 -------------------------------------------------------------------------------- Physician Orders Details Patient Name: Date of Service: [name], [name] 09/21/2020 12:30 PM Medical Record Number: [id number] Patient Account  Number: [id number] Date of Birth/Sex: Treating RN: November 14, 1926 (86 y.o. [name] Primary Care Provider: Wenda Low Other Clinician: Referring  Provider: Treating Provider/Extender: Lajean Silvius, Fransisca Connors in Treatment: 7 Verbal / Phone Orders: No Diagnosis Coding ICD-10 Coding Code Description [phone] Chronic venous hypertension (idiopathic) with ulcer and inflammation of left lower extremity L97.222 Non-pressure chronic ulcer of left 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 Follow-up Appointments Return Appointment in 2 weeks. Dressing Change Frequency Other: - HH to change twice a week Skin Barriers/Peri-Wound Care Moisturizing lotion - patient to lotion right leg daily. home health to apply lotion with dressing changes to left leg. TCA Cream or Ointment - mixed with lotion to left leg in clinic. Wound Cleansing May shower with protection. - use cast protector. May shower and wash wound with soap and water. - with dressing changes. Primary Wound Dressing Wound #1 Left,Medial Lower Leg Hydrofera Blue - classic moistened with saline Secondary Dressing Wound #1 Left,Medial Lower Leg Dry Gauze ABD pad Edema Control 3 Layer Compression System - Left Lower Extremity Avoid standing for long periods of time Elevate legs to the level of the heart or above for 30 minutes daily and/or when sitting, a frequency of: - throughout the day 3 to 4 times a day. Exercise regularly Support Garment 20-30 mm/Hg pressure to: - wear stocking on right leg. apply in the morning and remove at night. [place] skilled nursing for wound care. - [facility]. Electronic Signature(s) Signed: 09/21/2020 5:14:03 PM By: Linton Ham MD Signed: 09/21/2020 5:38:55 PM By: Deon Pilling Entered By: Deon Pilling on 09/21/2020 13:18:31 -------------------------------------------------------------------------------- Problem List Details Patient Name: Date of  Service: [name], [name] 09/21/2020 12:30 PM Medical Record Number: [id number] Patient Account Number: [id number] Date of Birth/Sex: Treating RN: November 14, 1926 (86 y.o. 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 09/21/2020 12:30 PM Medical Record Number: [id number] Patient Account Number: [id number] Date of Birth/Sex: Treating RN: 09-Feb-1927 (85 y.o. [name] Primary Care Provider: Wenda Low Other Clinician: Referring Provider: Treating Provider/Extender: Bryon Lions in Treatment: 7 Subjective History of Present Illness (HPI) ADMISSION 08/03/2020 Patient is a pleasant 86 year old woman accompanied by her granddaughter. She lives on her own but I think there is extensive family support. About 8 weeks ago she was taking off her compression stocking and traumatized the anterior part of her lower extremity on the left. She has seen her primary physician and was initially prescribed Keflex and then a course of clindamycin. She has home health they are applying silver alginate ABDs and kerlix. Not making any progress with the wound and they are here for our review of this. Past medical history includes COPD, rosacea,  history of lung cancer status post surgery on the right lung, hypertension, spinal stenosis, compression stocking use fairly reliably. She has advanced home care already ABI on the left in our clinic was 1.19 10/8; patient with a large predominantly venous wound in the left medial lower leg. We used Iodoflex under compression last week. The wound looks slightly better 10/22; 2-week follow-up. She has home  health. Largely venous wound in the left medial lower leg slightly smaller we are using Iodoflex under compression and finally have a decent looking wound surface today I changed her to [facility] 11/5; 2-week follow-up. Venous wound in the left medial lower leg surface area is better. She continues to have decent looking wound bed. She has a skin tear on the left mid tibia area which I think was home health initiated. She complained about one of the wraps being too tight but are seems to work well 11/18; 2-week follow-up. Venous wounds in the left medial lower leg. This is come down in size were using Hydrofera Blue under 3 layer compression. The anterior trauma from last time she was here is closed over. She is concerned about an area on the right medial lower leg but I do not see anything open here. She does have very frail skin however Objective Constitutional Sitting or standing Blood Pressure is within target range for patient.. Pulse regular and within target range for patient.Marland Kitchen Respirations regular, non-labored and within target range.. Temperature is normal and within the target range for the patient.Marland Kitchen Appears in no distress. Vitals Time Taken: 12:53 PM, Height: 61 in, Weight: 155 lbs, BMI: 29.3, Temperature: 97.6 F, Pulse: 65 bpm, Respiratory Rate: 20 breaths/min, Blood Pressure: 124/77 mmHg. Cardiovascular Pedal pulses palpable and strong bilaterally.. Good edema control. General Notes: Wound exam; sizable wound on the left medial lower leg. This is come down nicely in length.  Tissue looks healthy under illumination no debridement is required Integumentary (Hair, Skin) Wound #1 status is Open. Original cause of wound was Blister. The wound is located on the Left,Medial Lower Leg. The wound measures 3.5cm length x 1cm width x 0.1cm depth; 2.749cm^2 area and 0.275cm^3 volume. There is Fat Layer (Subcutaneous Tissue) exposed. There is no tunneling or undermining noted. There is a medium amount of serous drainage noted. The wound margin is distinct with the outline attached to the wound base. There is large (67-100%) red, pink granulation within the wound bed. There is no necrotic tissue within the wound bed. Wound #2 status is Healed - Epithelialized. Original cause of wound was Gradually Appeared. The wound is located on the Left,Lateral Lower Leg. The wound measures 0cm length x 0cm width x 0cm depth; 0cm^2 area and 0cm^3 volume. There is Fat Layer (Subcutaneous Tissue) exposed. There is no tunneling or undermining noted. There is a small amount of serosanguineous drainage noted. The wound margin is distinct with the outline attached to the wound base. There is large (67-100%) granulation within the wound bed. There is no necrotic tissue within the wound bed. Assessment Active Problems ICD-10 Chronic venous hypertension (idiopathic) with ulcer and inflammation of left lower extremity Non-pressure chronic ulcer of left 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 Procedures Wound #1 Pre-procedure diagnosis of Wound #1 is a Venous Leg Ulcer located on the Left,Medial Lower Leg . There was a Three Layer Compression Therapy Procedure by Baruch Gouty, RN. Post procedure Diagnosis Wound #1: Same as Pre-Procedure Plan Follow-up Appointments: Return Appointment in 2 weeks. Dressing Change Frequency: Other: - HH to change twice a week Skin Barriers/Peri-Wound Care: Moisturizing lotion - patient to lotion right leg daily. home health to apply lotion with dressing changes to left  leg. TCA Cream or Ointment - mixed with lotion to left leg in clinic. Wound Cleansing: May shower with protection. - use cast protector. May shower and wash wound with soap and water. - with dressing changes. Primary Wound Dressing: Wound #  1 Left,Medial Lower Leg: Hydrofera Blue - classic moistened with saline Secondary Dressing: Wound #1 Left,Medial Lower Leg: Dry Gauze ABD pad Edema Control: 3 Layer Compression System - Left Lower Extremity Avoid standing for long periods of time Elevate legs to the level of the heart or above for 30 minutes daily and/or when sitting, a frequency of: - throughout the day 3 to 4 times a day. Exercise regularly Support Garment 20-30 mm/Hg pressure to: - wear stocking on right leg. apply in the morning and remove at night. Home Health: [place] skilled nursing for wound care. - [facility]. 1. I continued with the Hydrofera Blue under 3 layer compression on the left Electronic Signature(s) Signed: 09/21/2020 5:14:03 PM By: Linton Ham MD Entered By: Linton Ham on 09/21/2020 13:24:20 -------------------------------------------------------------------------------- SuperBill Details Patient Name: Date of Service: [name], [name] 09/21/2020 Medical Record Number: [id number] Patient Account Number: [id number] Date of Birth/Sex: Treating RN: 05/10/1927 (86 y.o. [name] Primary Care Provider: Wenda Low Other Clinician: Referring Provider: Treating Provider/Extender: Bryon Lions in Treatment: 7 Diagnosis Coding ICD-10 Codes Code Description [id number] Chronic venous hypertension (idiopathic) with ulcer and inflammation of left lower extremity L97.222 Non-pressure chronic ulcer of left 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 Facility Procedures CPT4 Code: [id number] Description: (Facility Use Only) [id number] - [facility] L[facility] LT LEG Modifier: Quantity: 1 Physician Procedures : CPT4 Code  Description Modifier [id number] - WC PHYS LEVEL 3 - EST PT ICD-10 Diagnosis Description I87.332 Chronic venous hypertension (idiopathic) with ulcer and inflammation of left lower extremity L97.222 Non-pressure chronic ulcer of left calf  with fat layer exposed Quantity: 1 Electronic Signature(s) Signed: 09/21/2020 5:14:03 PM By: Linton Ham MD Entered By: Linton Ham on 09/21/2020 13:24:37

## 2020-09-21 NOTE — Progress Notes (Signed)
[name], [name] ([id number]) Visit Report for 09/21/2020 Arrival Information Details Patient Name: Date of Service: [name] [name]. 09/21/2020 12:30 PM Medical Record Number: [id number] Patient Account Number: [id number] Date of Birth/Sex: Treating RN: May 26, 1927 (86 y.o. Orvan Falconer Primary Care Justyn Boyson: Wenda Low Other Clinician: Referring Alvia Jablonski: Treating Kella Splinter/Extender: Bryon Lions in Treatment: 7 Visit Information History Since Last Visit All ordered tests and consults were completed: No Patient Arrived: Wheel Chair Added or deleted any medications: No Arrival Time: 12:52 Any new allergies or adverse reactions: No Accompanied By: grandaughter Had a fall or experienced change in No Transfer Assistance: None activities of daily living that may affect Patient Identification Verified: Yes risk of falls: Secondary Verification Process Completed: Yes Signs or symptoms of abuse/neglect since last visito No Hospitalized since last visit: No Implantable device outside of the clinic excluding No cellular tissue based products placed in the center since last visit: Has Dressing in Place as Prescribed: Yes Has Compression in Place as Prescribed: Yes Pain Present Now: No Electronic Signature(s) Signed: 09/21/2020 5:30:22 PM By: Carlene Coria RN Entered By: Carlene Coria on 09/21/2020 12:52:41 -------------------------------------------------------------------------------- Compression Therapy Details Patient Name: Date of Service: [name] [name]. 09/21/2020 12:30 PM Medical Record Number: [id number] Patient Account Number: [id number] Date of Birth/Sex: Treating RN: Mar 12, 1927 (86 y.o. [name] Primary Care Karry Barrilleaux: Wenda Low Other Clinician: Referring Ollen Rao: Treating Hisham Provence/Extender: Bryon Lions in Treatment: 7 Compression Therapy Performed for Wound Assessment: Wound #1 Left,Medial Lower  Leg Performed By: Clinician Baruch Gouty, RN Compression Type: Three Layer Post Procedure Diagnosis Same as Pre-procedure Electronic Signature(s) Signed: 09/21/2020 5:38:55 PM By: Deon Pilling Entered By: Deon Pilling on 09/21/2020 13:17:45 -------------------------------------------------------------------------------- Encounter Discharge Information Details Patient Name: Date of Service: [name] [name], [name] Ames M. 09/21/2020 12:30 PM Medical Record Number: [id number] Patient Account Number: [id number] Date of Birth/Sex: Treating RN: June 13, 1927 (86 y.o. Elam Dutch Primary Care Georg Ang: Wenda Low Other Clinician: Referring Brance Dartt: Treating Marva Hendryx/Extender: Bryon Lions in Treatment: 7 Encounter Discharge Information Items Discharge Condition: Stable Ambulatory Status: Wheelchair Discharge Destination: Home Transportation: Private Auto Accompanied By: Charolett Bumpers Schedule Follow-up Appointment: Yes Clinical Summary of Care: Patient Declined Electronic Signature(s) Signed: 09/21/2020 2:58:18 PM By: Baruch Gouty RN, BSN Entered By: Baruch Gouty on 09/21/2020 13:34:40 -------------------------------------------------------------------------------- Lower Extremity Assessment Details Patient Name: Date of Service: [name] Starks M. 09/21/2020 12:30 PM Medical Record Number: [id number] Patient Account Number: [id number] Date of Birth/Sex: Treating RN: 1927/01/31 (86 y.o. Orvan Falconer Primary Care Haden Suder: Wenda Low Other Clinician: Referring Shantella Blubaugh: Treating Leonides Minder/Extender: Bryon Lions in Treatment: 7 Edema Assessment Assessed: Shirlyn Goltz: No] [Right: No] Edema: [Left: Ye] [Right: s] Calf Left: Right: Point of Measurement: From Medial Instep 35 cm Ankle Left: Right: Point of Measurement: From Medial Instep 19 cm Electronic Signature(s) Signed: 09/21/2020 5:30:22 PM By: Carlene Coria RN Entered  By: Carlene Coria on 09/21/2020 13:05:17 -------------------------------------------------------------------------------- Multi Wound Chart Details Patient Name: Date of Service: [name] [name], [name] Ames M. 09/21/2020 12:30 PM Medical Record Number: [id number] Patient Account Number: [id number] Date of Birth/Sex: Treating RN: [birth year] (86 y.o. [name] Primary Care Dail Meece: Wenda Low Other Clinician: Referring Ramla Hase: Treating Shavonta Gossen/Extender: Bryon Lions in Treatment: 7 Vital Signs Height(in): 68 Pulse(bpm): 74 Weight(lbs): 155 Blood Pressure(mmHg): 124/77 Body Mass Index(BMI): 29 Temperature(F): 97.6 Respiratory Rate(breaths/min): 20 Photos: [1:No Photos Left, Medial Lower Leg] [2:No Photos Left, Lateral Lower Leg] [N/A:N/A N/A] Wound Location: [  1:Blister] [2:Gradually Appeared] [N/A:N/A] Wounding Event: [1:Venous Leg Ulcer] [2:Skin T ear] [N/A:N/A] Primary Etiology: [1:Asthma, Chronic Obstructive] [2:Asthma, Chronic Obstructive] [N/A:N/A] Comorbid History: [1:Pulmonary Disease (COPD), Hypertension, Received Chemotherapy 06/19/2020] [2:Pulmonary Disease (COPD), Hypertension, Received Chemotherapy 09/06/2020] [N/A:N/A] Date Acquired: [1:7] [2:1] [N/A:N/A] Weeks of Treatment: [1:Open] [2:Healed - Epithelialized] [N/A:N/A] Wound Status: [1:3.5x1x0.1] [2:0x0x0] [N/A:N/A] Measurements L x W x D (cm) [1:2.749] [2:0] [N/A:N/A] A (cm) : rea [1:0.275] [2:0] [N/A:N/A] Volume (cm) : [1:88.40%] [2:100.00%] [N/A:N/A] % Reduction in Area: [1:88.40%] [2:100.00%] [N/A:N/A] % Reduction in Volume: [1:Full Thickness Without Exposed] [2:Full Thickness Without Exposed] [N/A:N/A] Classification: [1:Support Structures Medium] [2:Support Structures Small] [N/A:N/A] Exudate Amount: [1:Serous] [2:Serosanguineous] [N/A:N/A] Exudate Type: [1:amber] [2:red, brown] [N/A:N/A] Exudate Color: [1:Distinct, outline attached] [2:Distinct, outline attached] [N/A:N/A] Wound  Margin: [1:Large (67-100%)] [2:Large (67-100%)] [N/A:N/A] Granulation Amount: [1:Red, Pink] [2:N/A] [N/A:N/A] Granulation Quality: [1:None Present (0%)] [2:None Present (0%)] [N/A:N/A] Necrotic Amount: [1:Fat Layer (Subcutaneous Tissue): Yes Fat Layer (Subcutaneous Tissue): Yes N/A] Exposed Structures: [1:Fascia: No Tendon: No Muscle: No Joint: No Bone: No Medium (34-66%)] [2:Fascia: No Tendon: No Muscle: No Joint: No Bone: No None] [N/A:N/A] Epithelialization: [1:Compression Therapy] [2:N/A] [N/A:N/A] Treatment Notes Electronic Signature(s) Signed: 09/21/2020 5:14:03 PM By: Linton Ham MD Signed: 09/21/2020 5:38:55 PM By: Deon Pilling Entered By: Linton Ham on 09/21/2020 13:21:51 -------------------------------------------------------------------------------- [place] Details Patient Name: Date of Service: [name] [name], [name] Ames M. 09/21/2020 12:30 PM Medical Record Number: [id number] Patient Account Number: [id number] Date of Birth/Sex: Treating RN: [birth year] (86 y.o. [name] Primary Care Amali Uhls: Wenda Low Other Clinician: Referring Jaisha Villacres: Treating Lilybeth Vien/Extender: Bryon Lions in Treatment: 7 Active Inactive Venous Leg Ulcer Nursing Diagnoses: Knowledge deficit related to disease process and management Potential for venous Insuffiency (use before diagnosis confirmed) Goals: Patient will maintain optimal edema control Date Initiated: 08/25/2020 Target Resolution Date: 11/03/2020 Goal Status: Active Interventions: Assess peripheral edema status every visit. Compression as ordered Provide education on venous insufficiency Treatment Activities: Therapeutic compression applied : 08/25/2020 Notes: Electronic Signature(s) Signed: 09/21/2020 5:38:55 PM By: Deon Pilling Entered By: Deon Pilling on 09/21/2020 13:19:13 -------------------------------------------------------------------------------- Pain  Assessment Details Patient Name: Date of Service: [name] [name]. 09/21/2020 12:30 PM Medical Record Number: [id number] Patient Account Number: [id number] Date of Birth/Sex: Treating RN: 1927-09-20 (86 y.o. Orvan Falconer Primary Care Shanty Ginty: Wenda Low Other Clinician: Referring Kately Graffam: Treating Rayanna Matusik/Extender: Bryon Lions in Treatment: 7 Active Problems Location of Pain Severity and Description of Pain Patient Has Paino No Site Locations Pain Management and Medication Current Pain Management: Electronic Signature(s) Signed: 09/21/2020 5:30:22 PM By: Carlene Coria RN Entered By: Carlene Coria on 09/21/2020 12:53:17 -------------------------------------------------------------------------------- Patient/Caregiver Education Details Patient Name: Date of Service: [name] [name] 11/18/2021andnbsp12:30 PM Medical Record Number: [id number] Patient Account Number: [id number] Date of Birth/Gender: Treating RN: 09-24-1927 (86 y.o. [name] Primary Care Physician: Wenda Low Other Clinician: Referring Physician: Treating Physician/Extender: Bryon Lions in Treatment: 7 Education Assessment Education Provided To: Patient Education Topics Provided Venous: Handouts: Controlling Swelling with Multilayered Compression Wraps Methods: Explain/Verbal Responses: Reinforcements needed Electronic Signature(s) Signed: 09/21/2020 5:38:55 PM By: Deon Pilling Entered By: Deon Pilling on 09/21/2020 13:19:25 -------------------------------------------------------------------------------- Wound Assessment Details Patient Name: Date of Service: [name] [name]. 09/21/2020 12:30 PM Medical Record Number: [id number] Patient Account Number: [id number] Date of Birth/Sex: Treating RN: December 08, 1926 (86 y.o. [name] Primary Care Deano Tomaszewski: Wenda Low Other Clinician: Referring Khyron Garno: Treating  Jaimya Feliciano/Extender: Bryon Lions in Treatment: 7  Wound Status Wound Number: 1 Primary Venous Leg Ulcer Etiology: Wound Location: Left, Medial Lower Leg Wound Open Wounding Event: Blister Status: Date Acquired: 06/19/2020 Comorbid Asthma, Chronic Obstructive Pulmonary Disease (COPD), Weeks Of Treatment: 7 History: Hypertension, Received Chemotherapy Clustered Wound: No Wound Measurements Length: (cm) 3.5 Width: (cm) 1 Depth: (cm) 0.1 Area: (cm) 2.749 Volume: (cm) 0.275 % Reduction in Area: 88.4% % Reduction in Volume: 88.4% Epithelialization: Medium (34-66%) Tunneling: No Undermining: No Wound Description Classification: Full Thickness Without Exposed Support Structures Wound Margin: Distinct, outline attached Exudate Amount: Medium Exudate Type: Serous Exudate Color: amber Foul Odor After Cleansing: No Slough/Fibrino No Wound Bed Granulation Amount: Large (67-100%) Exposed Structure Granulation Quality: Red, Pink Fascia Exposed: No Necrotic Amount: None Present (0%) Fat Layer (Subcutaneous Tissue) Exposed: Yes Tendon Exposed: No Muscle Exposed: No Joint Exposed: No Bone Exposed: No Treatment Notes Wound #1 (Left, Medial Lower Leg) 2. Periwound Care Moisturizing lotion TCA Cream 3. Primary Dressing Applied Hydrofera Blue 4. Secondary Dressing Dry Gauze 6. Support Layer Applied 3 layer compression wrap Notes netting. Electronic Signature(s) Signed: 09/21/2020 5:38:55 PM By: Deon Pilling Entered By: Deon Pilling on 09/21/2020 13:17:19 -------------------------------------------------------------------------------- Wound Assessment Details Patient Name: Date of Service: [name] [name]. 09/21/2020 12:30 PM Medical Record Number: [id number] Patient Account Number: [id number] Date of Birth/Sex: Treating RN: [birth year] (86 y.o. [name], [name] Primary Care Alika Saladin: Wenda Low Other Clinician: Referring Eri Platten: Treating  Luismanuel Corman/Extender: Bryon Lions in Treatment: 7 Wound Status Wound Number: 2 Primary Skin Tear Etiology: Wound Location: Left, Lateral Lower Leg Wound Healed - Epithelialized Wounding Event: Gradually Appeared Status: Date Acquired: 09/06/2020 Comorbid Asthma, Chronic Obstructive Pulmonary Disease (COPD), Weeks Of Treatment: 1 History: Hypertension, Received Chemotherapy Clustered Wound: No Wound Measurements Length: (cm) Width: (cm) Depth: (cm) Area: (cm) Volume: (cm) 0 % Reduction in Area: 100% 0 % Reduction in Volume: 100% 0 Epithelialization: None 0 Tunneling: No 0 Undermining: No Wound Description Classification: Full Thickness Without Exposed Support Structures Wound Margin: Distinct, outline attached Exudate Amount: Small Exudate Type: Serosanguineous Exudate Color: red, brown Foul Odor After Cleansing: No Slough/Fibrino No Wound Bed Granulation Amount: Large (67-100%) Exposed Structure Necrotic Amount: None Present (0%) Fascia Exposed: No Fat Layer (Subcutaneous Tissue) Exposed: Yes Tendon Exposed: No Muscle Exposed: No Joint Exposed: No Bone Exposed: No Electronic Signature(s) Signed: 09/21/2020 5:38:55 PM By: Deon Pilling Entered By: Deon Pilling on 09/21/2020 13:17:02 -------------------------------------------------------------------------------- Vitals Details Patient Name: Date of Service: [name] [name], [name] Ames M. 09/21/2020 12:30 PM Medical Record Number: [id number] Patient Account Number: [id number] Date of Birth/Sex: Treating RN: 11-30-1926 (86 y.o. Orvan Falconer Primary Care Teanna Elem: Wenda Low Other Clinician: Referring Halsey Persaud: Treating Athira Janowicz/Extender: Bryon Lions in Treatment: 7 Vital Signs Time Taken: 12:53 Temperature (F): 97.6 Height (in): 61 Pulse (bpm): 65 Weight (lbs): 155 Respiratory Rate (breaths/min): 20 Body Mass Index (BMI): 29.3 Blood Pressure (mmHg):  124/77 Reference Range: 80 - 120 mg / dl Electronic Signature(s) Signed: 09/21/2020 5:30:22 PM By: Carlene Coria RN Entered By: Carlene Coria on 09/21/2020 12:53:10

## 2020-09-25 DIAGNOSIS — J449 Chronic obstructive pulmonary disease, unspecified: Secondary | ICD-10-CM | POA: Diagnosis not present

## 2020-09-25 DIAGNOSIS — S81802D Unspecified open wound, left lower leg, subsequent encounter: Secondary | ICD-10-CM | POA: Diagnosis not present

## 2020-09-25 DIAGNOSIS — E785 Hyperlipidemia, unspecified: Secondary | ICD-10-CM | POA: Diagnosis not present

## 2020-09-25 DIAGNOSIS — I87309 Chronic venous hypertension (idiopathic) without complications of unspecified lower extremity: Secondary | ICD-10-CM | POA: Diagnosis not present

## 2020-09-25 DIAGNOSIS — N183 Chronic kidney disease, stage 3 unspecified: Secondary | ICD-10-CM | POA: Diagnosis not present

## 2020-09-25 DIAGNOSIS — I129 Hypertensive chronic kidney disease with stage 1 through stage 4 chronic kidney disease, or unspecified chronic kidney disease: Secondary | ICD-10-CM | POA: Diagnosis not present

## 2020-09-29 DIAGNOSIS — N183 Chronic kidney disease, stage 3 unspecified: Secondary | ICD-10-CM | POA: Diagnosis not present

## 2020-09-29 DIAGNOSIS — E785 Hyperlipidemia, unspecified: Secondary | ICD-10-CM | POA: Diagnosis not present

## 2020-09-29 DIAGNOSIS — I129 Hypertensive chronic kidney disease with stage 1 through stage 4 chronic kidney disease, or unspecified chronic kidney disease: Secondary | ICD-10-CM | POA: Diagnosis not present

## 2020-09-29 DIAGNOSIS — J449 Chronic obstructive pulmonary disease, unspecified: Secondary | ICD-10-CM | POA: Diagnosis not present

## 2020-09-29 DIAGNOSIS — S81802D Unspecified open wound, left lower leg, subsequent encounter: Secondary | ICD-10-CM | POA: Diagnosis not present

## 2020-09-29 DIAGNOSIS — I87309 Chronic venous hypertension (idiopathic) without complications of unspecified lower extremity: Secondary | ICD-10-CM | POA: Diagnosis not present

## 2020-10-03 DIAGNOSIS — S81802D Unspecified open wound, left lower leg, subsequent encounter: Secondary | ICD-10-CM | POA: Diagnosis not present

## 2020-10-03 DIAGNOSIS — N183 Chronic kidney disease, stage 3 unspecified: Secondary | ICD-10-CM | POA: Diagnosis not present

## 2020-10-03 DIAGNOSIS — I129 Hypertensive chronic kidney disease with stage 1 through stage 4 chronic kidney disease, or unspecified chronic kidney disease: Secondary | ICD-10-CM | POA: Diagnosis not present

## 2020-10-03 DIAGNOSIS — E785 Hyperlipidemia, unspecified: Secondary | ICD-10-CM | POA: Diagnosis not present

## 2020-10-03 DIAGNOSIS — J449 Chronic obstructive pulmonary disease, unspecified: Secondary | ICD-10-CM | POA: Diagnosis not present

## 2020-10-03 DIAGNOSIS — I87309 Chronic venous hypertension (idiopathic) without complications of unspecified lower extremity: Secondary | ICD-10-CM | POA: Diagnosis not present

## 2020-10-05 ENCOUNTER — Other Ambulatory Visit: Payer: Self-pay

## 2020-10-05 ENCOUNTER — Encounter (HOSPITAL_BASED_OUTPATIENT_CLINIC_OR_DEPARTMENT_OTHER): Payer: Medicare Other | Attending: Internal Medicine | Admitting: Internal Medicine

## 2020-10-05 DIAGNOSIS — Z85118 Personal history of other malignant neoplasm of bronchus and lung: Secondary | ICD-10-CM | POA: Diagnosis not present

## 2020-10-05 DIAGNOSIS — I890 Lymphedema, not elsewhere classified: Secondary | ICD-10-CM | POA: Diagnosis not present

## 2020-10-05 DIAGNOSIS — L97812 Non-pressure chronic ulcer of other part of right lower leg with fat layer exposed: Secondary | ICD-10-CM | POA: Diagnosis not present

## 2020-10-05 DIAGNOSIS — I87332 Chronic venous hypertension (idiopathic) with ulcer and inflammation of left lower extremity: Secondary | ICD-10-CM | POA: Diagnosis not present

## 2020-10-05 DIAGNOSIS — L97818 Non-pressure chronic ulcer of other part of right lower leg with other specified severity: Secondary | ICD-10-CM | POA: Insufficient documentation

## 2020-10-05 NOTE — Progress Notes (Signed)
LEONIA, HEATHERLY ([id number]) Visit Report for 10/05/2020 Arrival Information Details Patient Name: Date of Service: [name] [name] [place] 10/05/2020 2:15 PM Medical Record Number: [id number] Patient Account Number: [id number] Date of Birth/Sex: Treating RN: [birth year] (86 y.o. Orvan Falconer Primary Care Meoshia Billing: Wenda Low Other Clinician: Referring Meshulem Onorato: Treating Letia Guidry/Extender: Bryon Lions in Treatment: 9 Visit Information History Since Last Visit All ordered tests and consults were completed: No Patient Arrived: Wheel Chair Added or deleted any medications: No Arrival Time: 14:40 Any new allergies or adverse reactions: No Accompanied By: self Had a fall or experienced change in No Transfer Assistance: None activities of daily living that may affect Patient Identification Verified: Yes risk of falls: Secondary Verification Process Completed: Yes Signs or symptoms of abuse/neglect since last visito No Patient Requires Transmission-Based Precautions: No Hospitalized since last visit: No Patient Has Alerts: No Implantable device outside of the clinic excluding No cellular tissue based products placed in the center since last visit: Has Dressing in Place as Prescribed: Yes Has Compression in Place as Prescribed: Yes Pain Present Now: No Electronic Signature(s) Signed: 10/05/2020 4:55:20 PM By: Carlene Coria RN Entered By: Carlene Coria on 10/05/2020 14:41:35 -------------------------------------------------------------------------------- Compression Therapy Details Patient Name: Date of Service: [name] [name]. 10/05/2020 2:15 PM Medical Record Number: [id number] Patient Account Number: [id number] Date of Birth/Sex: Treating RN: 1927/09/12 (86 y.o. [name] Primary Care Cheryl Stabenow: Wenda Low Other Clinician: Referring Curt Oatis: Treating Nasean Zapf/Extender: Bryon Lions in Treatment: 9 Compression  Therapy Performed for Wound Assessment: Wound #3 Right,Medial Lower Leg Performed By: Clinician Baruch Gouty, RN Compression Type: Three Layer Post Procedure Diagnosis Same as Pre-procedure Electronic Signature(s) Signed: 10/05/2020 5:54:37 PM By: Deon Pilling Entered By: Deon Pilling on 10/05/2020 15:18:00 -------------------------------------------------------------------------------- Encounter Discharge Information Details Patient Name: Date of Service: [name] [name], [name] Ames M. 10/05/2020 2:15 PM Medical Record Number: [id number] Patient Account Number: [id number] Date of Birth/Sex: Treating RN: [birth year] (86 y.o. Tonita Phoenix, Lauren Primary Care Cecelia Graciano: Wenda Low Other Clinician: Referring Micky Sheller: Treating Zia Najera/Extender: Bryon Lions in Treatment: 9 Encounter Discharge Information Items Discharge Condition: Stable Ambulatory Status: Ambulatory Discharge Destination: Home Transportation: Private Auto Accompanied By: self Schedule Follow-up Appointment: Yes Clinical Summary of Care: Patient Declined Electronic Signature(s) Signed: 10/05/2020 5:30:01 PM By: Rhae Hammock RN Entered By: Rhae Hammock on 10/05/2020 17:30:01 -------------------------------------------------------------------------------- Lower Extremity Assessment Details Patient Name: Date of Service: [name] [name]. 10/05/2020 2:15 PM Medical Record Number: [id number] Patient Account Number: [id number] Date of Birth/Sex: Treating RN: 10/16/1927 (86 y.o. Orvan Falconer Primary Care Sahmir Weatherbee: Wenda Low Other Clinician: Referring Jazman Reuter: Treating Johnta Couts/Extender: Bryon Lions in Treatment: 9 Edema Assessment Assessed: Shirlyn Goltz: No] Patrice Paradise: No] Edema: [Left: Ye] [Right: s] Calf Left: Right: Point of Measurement: From Medial Instep 35 cm Ankle Left: Right: Point of Measurement: From Medial Instep 19 cm Electronic  Signature(s) Signed: 10/05/2020 4:55:20 PM By: Carlene Coria RN Entered By: Carlene Coria on 10/05/2020 14:47:11 -------------------------------------------------------------------------------- Multi Wound Chart Details Patient Name: Date of Service: [name] [name], [name] Ames M. 10/05/2020 2:15 PM Medical Record Number: [id number] Patient Account Number: [id number] Date of Birth/Sex: Treating RN: [birth year] (86 y.o. [name] Primary Care Jairen Goldfarb: Wenda Low Other Clinician: Referring Marigene Erler: Treating Doral Digangi/Extender: Bryon Lions in Treatment: 9 Vital Signs Height(in): 61 Pulse(bpm): 35 Weight(lbs): 155 Blood Pressure(mmHg): 134/61 Body Mass Index(BMI): 29 Temperature(F): 98.2 Respiratory Rate(breaths/min): 18 Photos: [1:No Photos Left, Medial Lower Leg] [3:No Photos  Right, Medial Lower Leg] [N/A:N/A N/A] Wound Location: [1:Blister] [3:Gradually Appeared] [N/A:N/A] Wounding Event: [1:Venous Leg Ulcer] [3:Venous Leg Ulcer] [N/A:N/A] Primary Etiology: [1:Asthma, Chronic Obstructive] [3:Asthma, Chronic Obstructive] [N/A:N/A] Comorbid History: [1:Pulmonary Disease (COPD), Hypertension, Received Chemotherapy 06/19/2020] [3:Pulmonary Disease (COPD), Hypertension, Received Chemotherapy 10/03/2020] [N/A:N/A] Date Acquired: [1:9] [3:0] [N/A:N/A] Weeks of Treatment: [1:Open] [3:Open] [N/A:N/A] Wound Status: [1:0x0x0] [3:0.4x0.4x0.1] [N/A:N/A] Measurements L x W x D (cm) [1:0] [3:0.126] [N/A:N/A] A (cm) : rea [1:0] [3:0.013] [N/A:N/A] Volume (cm) : [1:100.00%] [3:0.00%] [N/A:N/A] % Reduction in Area: [1:100.00%] [3:0.00%] [N/A:N/A] % Reduction in Volume: [1:Full Thickness Without Exposed] [3:Full Thickness Without Exposed] [N/A:N/A] Classification: [1:Support Structures None Present] [3:Support Structures Medium] [N/A:N/A] Exudate Amount: [1:N/A] [3:Serosanguineous] [N/A:N/A] Exudate Type: [1:N/A] [3:red, brown] [N/A:N/A] Exudate Color: [1:Distinct,  outline attached] [3:N/A] [N/A:N/A] Wound Margin: [1:None Present (0%)] [3:Large (67-100%)] [N/A:N/A] Granulation Amount: [1:N/A] [3:Red, Pink] [N/A:N/A] Granulation Quality: [1:None Present (0%)] [3:N/A] [N/A:N/A] Necrotic Amount: [1:Fascia: No] [3:Fat Layer (Subcutaneous Tissue): Yes N/A] Exposed Structures: [1:Fat Layer (Subcutaneous Tissue): No Tendon: No Muscle: No Joint: No Bone: No Large (67-100%)] [3:Fascia: No Tendon: No Muscle: No Joint: No Bone: No None] [N/A:N/A] Epithelialization: [1:N/A] [3:Compression Therapy] [N/A:N/A] Treatment Notes Electronic Signature(s) Signed: 10/05/2020 5:09:23 PM By: Linton Ham MD Signed: 10/05/2020 5:54:37 PM By: Deon Pilling Entered By: Linton Ham on 10/05/2020 15:52:11 -------------------------------------------------------------------------------- [place] Details Patient Name: Date of Service: [name] [name], [name] Ames M. 10/05/2020 2:15 PM Medical Record Number: [id number] Patient Account Number: [id number] Date of Birth/Sex: Treating RN: 03/02/1927 (86 y.o. [name] Primary Care Shoua Ressler: Wenda Low Other Clinician: Referring Sunset Joshi: Treating Michayla Mcneil/Extender: Bryon Lions in Treatment: 9 Active Inactive Venous Leg Ulcer Nursing Diagnoses: Knowledge deficit related to disease process and management Potential for venous Insuffiency (use before diagnosis confirmed) Goals: Patient will maintain optimal edema control Date Initiated: 08/25/2020 Target Resolution Date: 11/03/2020 Goal Status: Active Interventions: Assess peripheral edema status every visit. Compression as ordered Provide education on venous insufficiency Treatment Activities: Therapeutic compression applied : 08/25/2020 Notes: Electronic Signature(s) Signed: 10/05/2020 5:54:37 PM By: Deon Pilling Entered By: Deon Pilling on 10/05/2020  14:59:45 -------------------------------------------------------------------------------- Pain Assessment Details Patient Name: Date of Service: [name] [name]. 10/05/2020 2:15 PM Medical Record Number: [id number] Patient Account Number: [id number] Date of Birth/Sex: Treating RN: 1926/12/31 (86 y.o. Orvan Falconer Primary Care Sanora Cunanan: Wenda Low Other Clinician: Referring Jazlynn Nemetz: Treating Georges Victorio/Extender: Bryon Lions in Treatment: 9 Active Problems Location of Pain Severity and Description of Pain Patient Has Paino No Site Locations Pain Management and Medication Current Pain Management: Electronic Signature(s) Signed: 10/05/2020 4:55:20 PM By: Carlene Coria RN Entered By: Carlene Coria on 10/05/2020 14:42:20 -------------------------------------------------------------------------------- Patient/Caregiver Education Details Patient Name: Date of Service: [name] [name] 12/2/2021andnbsp2:15 PM Medical Record Number: [id number] Patient Account Number: [id number] Date of Birth/Gender: Treating RN: January 27, 1927 (86 y.o. [name] Primary Care Physician: Wenda Low Other Clinician: Referring Physician: Treating Physician/Extender: Bryon Lions in Treatment: 9 Education Assessment Education Provided To: Patient Education Topics Provided Venous: Handouts: Managing Venous Disease and Related Ulcers Methods: Explain/Verbal Responses: Reinforcements needed Electronic Signature(s) Signed: 10/05/2020 5:54:37 PM By: Deon Pilling Entered By: Deon Pilling on 10/05/2020 15:00:06 -------------------------------------------------------------------------------- Wound Assessment Details Patient Name: Date of Service: [name] [name]. 10/05/2020 2:15 PM Medical Record Number: [id number] Patient Account Number: [id number] Date of Birth/Sex: Treating RN: 1927-03-09 (86 y.o. Orvan Falconer Primary Care  Jerre Vandrunen: Wenda Low Other Clinician: Referring Ranjit Ashurst: Treating Wylene Weissman/Extender: Dorothe Pea  Weeks in Treatment: 9 Wound Status Wound Number: 1 Primary Venous Leg Ulcer Etiology: Wound Location: Left, Medial Lower Leg Wound Open Wounding Event: Blister Status: Date Acquired: 06/19/2020 Comorbid Asthma, Chronic Obstructive Pulmonary Disease (COPD), Weeks Of Treatment: 9 History: Hypertension, Received Chemotherapy Clustered Wound: No Wound Measurements Length: (cm) Width: (cm) Depth: (cm) Area: (cm) Volume: (cm) 0 % Reduction in Area: 100% 0 % Reduction in Volume: 100% 0 Epithelialization: Large (67-100%) 0 Tunneling: No 0 Undermining: No Wound Description Classification: Full Thickness Without Exposed Support Structures Wound Margin: Distinct, outline attached Exudate Amount: None Present Foul Odor After Cleansing: No Slough/Fibrino No Wound Bed Granulation Amount: None Present (0%) Exposed Structure Necrotic Amount: None Present (0%) Fascia Exposed: No Fat Layer (Subcutaneous Tissue) Exposed: No Tendon Exposed: No Muscle Exposed: No Joint Exposed: No Bone Exposed: No Electronic Signature(s) Signed: 10/05/2020 4:55:20 PM By: Carlene Coria RN Entered By: Carlene Coria on 10/05/2020 14:48:00 -------------------------------------------------------------------------------- Wound Assessment Details Patient Name: Date of Service: [name] [name]. 10/05/2020 2:15 PM Medical Record Number: [id number] Patient Account Number: [id number] Date of Birth/Sex: Treating RN: 1927/05/20 (86 y.o. Orvan Falconer Primary Care Thandiwe Siragusa: Wenda Low Other Clinician: Referring Darrek Leasure: Treating Purvis Sidle/Extender: Bryon Lions in Treatment: 9 Wound Status Wound Number: 3 Primary Venous Leg Ulcer Etiology: Wound Location: Right, Medial Lower Leg Wound Open Wounding Event: Gradually Appeared Status: Date Acquired:  10/03/2020 Comorbid Asthma, Chronic Obstructive Pulmonary Disease (COPD), Weeks Of Treatment: 0 History: Hypertension, Received Chemotherapy Clustered Wound: No Wound Measurements Length: (cm) 0.4 Width: (cm) 0.4 Depth: (cm) 0.1 Area: (cm) 0.126 Volume: (cm) 0.013 % Reduction in Area: 0% % Reduction in Volume: 0% Epithelialization: None Tunneling: No Undermining: No Wound Description Classification: Full Thickness Without Exposed Support Structures Exudate Amount: Medium Exudate Type: Serosanguineous Exudate Color: red, brown Foul Odor After Cleansing: No Slough/Fibrino No Wound Bed Granulation Amount: Large (67-100%) Exposed Structure Granulation Quality: Red, Pink Fascia Exposed: No Fat Layer (Subcutaneous Tissue) Exposed: Yes Tendon Exposed: No Muscle Exposed: No Joint Exposed: No Bone Exposed: No Treatment Notes Wound #3 (Lower Leg) Wound Laterality: Right, Medial Cleanser Normal Saline Discharge Instruction: Cleanse the wound with Normal Saline prior to applying a clean dressing using gauze sponges, not tissue or cotton balls. Wound Cleanser Discharge Instruction: Cleanse the wound with wound cleanser prior to applying a clean dressing using gauze sponges, not tissue or cotton balls. Peri-Wound Care Sween Lotion (Moisturizing lotion) Discharge Instruction: Apply moisturizing lotion as directed Topical Primary Dressing Hydrofera Blue Classic Foam, 2x2 in Discharge Instruction: MOISTEN WITH SALINE. Apply to wound bed as instructed Secondary Dressing Woven Gauze Sponge, Non-Sterile 4x4 in Discharge Instruction: Apply over primary dressing as directed. Secured With Compression Wrap ThreePress (3 layer compression wrap) Discharge Instruction: Apply three layer compression as directed. Compression Stockings Add-Ons Electronic Signature(s) Signed: 10/05/2020 4:55:20 PM By: Carlene Coria RN Entered By: Carlene Coria on 10/05/2020  14:50:33 -------------------------------------------------------------------------------- Vitals Details Patient Name: Date of Service: [name] [name], [name] Ames M. 10/05/2020 2:15 PM Medical Record Number: [id number] Patient Account Number: [id number] Date of Birth/Sex: Treating RN: [birth year] (86 y.o. Orvan Falconer Primary Care Tyvon Eggenberger: Wenda Low Other Clinician: Referring Zainah Steven: Treating Feliz Lincoln/Extender: Bryon Lions in Treatment: 9 Vital Signs Time Taken: 14:41 Temperature (F): 98.2 Height (in): 61 Pulse (bpm): 67 Weight (lbs): 155 Respiratory Rate (breaths/min): 18 Body Mass Index (BMI): 29.3 Blood Pressure (mmHg): 134/61 Reference Range: 80 - 120 mg / dl Electronic Signature(s) Signed: 10/05/2020 4:55:20 PM By: Carlene Coria RN Entered By:  Carlene Coria on 10/05/2020 14:42:09

## 2020-10-06 DIAGNOSIS — E559 Vitamin D deficiency, unspecified: Secondary | ICD-10-CM | POA: Diagnosis not present

## 2020-10-06 DIAGNOSIS — Z902 Acquired absence of lung [part of]: Secondary | ICD-10-CM | POA: Diagnosis not present

## 2020-10-06 DIAGNOSIS — G4700 Insomnia, unspecified: Secondary | ICD-10-CM | POA: Diagnosis not present

## 2020-10-06 DIAGNOSIS — Z9181 History of falling: Secondary | ICD-10-CM | POA: Diagnosis not present

## 2020-10-06 DIAGNOSIS — M19012 Primary osteoarthritis, left shoulder: Secondary | ICD-10-CM | POA: Diagnosis not present

## 2020-10-06 DIAGNOSIS — I058 Other rheumatic mitral valve diseases: Secondary | ICD-10-CM | POA: Diagnosis not present

## 2020-10-06 DIAGNOSIS — C3491 Malignant neoplasm of unspecified part of right bronchus or lung: Secondary | ICD-10-CM | POA: Diagnosis not present

## 2020-10-06 DIAGNOSIS — Z87891 Personal history of nicotine dependence: Secondary | ICD-10-CM | POA: Diagnosis not present

## 2020-10-06 DIAGNOSIS — M5030 Other cervical disc degeneration, unspecified cervical region: Secondary | ICD-10-CM | POA: Diagnosis not present

## 2020-10-06 DIAGNOSIS — Z7951 Long term (current) use of inhaled steroids: Secondary | ICD-10-CM | POA: Diagnosis not present

## 2020-10-06 DIAGNOSIS — N1831 Chronic kidney disease, stage 3a: Secondary | ICD-10-CM | POA: Diagnosis not present

## 2020-10-06 DIAGNOSIS — E782 Mixed hyperlipidemia: Secondary | ICD-10-CM | POA: Diagnosis not present

## 2020-10-06 DIAGNOSIS — S81802D Unspecified open wound, left lower leg, subsequent encounter: Secondary | ICD-10-CM | POA: Diagnosis not present

## 2020-10-06 DIAGNOSIS — M810 Age-related osteoporosis without current pathological fracture: Secondary | ICD-10-CM | POA: Diagnosis not present

## 2020-10-06 DIAGNOSIS — M1990 Unspecified osteoarthritis, unspecified site: Secondary | ICD-10-CM | POA: Diagnosis not present

## 2020-10-06 DIAGNOSIS — Z79891 Long term (current) use of opiate analgesic: Secondary | ICD-10-CM | POA: Diagnosis not present

## 2020-10-06 DIAGNOSIS — M8580 Other specified disorders of bone density and structure, unspecified site: Secondary | ICD-10-CM | POA: Diagnosis not present

## 2020-10-06 DIAGNOSIS — F322 Major depressive disorder, single episode, severe without psychotic features: Secondary | ICD-10-CM | POA: Diagnosis not present

## 2020-10-06 DIAGNOSIS — I129 Hypertensive chronic kidney disease with stage 1 through stage 4 chronic kidney disease, or unspecified chronic kidney disease: Secondary | ICD-10-CM | POA: Diagnosis not present

## 2020-10-06 DIAGNOSIS — C3490 Malignant neoplasm of unspecified part of unspecified bronchus or lung: Secondary | ICD-10-CM | POA: Diagnosis not present

## 2020-10-06 DIAGNOSIS — Z4800 Encounter for change or removal of nonsurgical wound dressing: Secondary | ICD-10-CM | POA: Diagnosis not present

## 2020-10-06 DIAGNOSIS — J449 Chronic obstructive pulmonary disease, unspecified: Secondary | ICD-10-CM | POA: Diagnosis not present

## 2020-10-06 DIAGNOSIS — E785 Hyperlipidemia, unspecified: Secondary | ICD-10-CM | POA: Diagnosis not present

## 2020-10-06 DIAGNOSIS — J309 Allergic rhinitis, unspecified: Secondary | ICD-10-CM | POA: Diagnosis not present

## 2020-10-06 DIAGNOSIS — M7542 Impingement syndrome of left shoulder: Secondary | ICD-10-CM | POA: Diagnosis not present

## 2020-10-06 DIAGNOSIS — N183 Chronic kidney disease, stage 3 unspecified: Secondary | ICD-10-CM | POA: Diagnosis not present

## 2020-10-06 DIAGNOSIS — K219 Gastro-esophageal reflux disease without esophagitis: Secondary | ICD-10-CM | POA: Diagnosis not present

## 2020-10-06 DIAGNOSIS — Z8781 Personal history of (healed) traumatic fracture: Secondary | ICD-10-CM | POA: Diagnosis not present

## 2020-10-06 DIAGNOSIS — M4800 Spinal stenosis, site unspecified: Secondary | ICD-10-CM | POA: Diagnosis not present

## 2020-10-06 DIAGNOSIS — I87309 Chronic venous hypertension (idiopathic) without complications of unspecified lower extremity: Secondary | ICD-10-CM | POA: Diagnosis not present

## 2020-10-06 DIAGNOSIS — E7800 Pure hypercholesterolemia, unspecified: Secondary | ICD-10-CM | POA: Diagnosis not present

## 2020-10-06 DIAGNOSIS — F329 Major depressive disorder, single episode, unspecified: Secondary | ICD-10-CM | POA: Diagnosis not present

## 2020-10-06 DIAGNOSIS — Z85118 Personal history of other malignant neoplasm of bronchus and lung: Secondary | ICD-10-CM | POA: Diagnosis not present

## 2020-10-06 DIAGNOSIS — I10 Essential (primary) hypertension: Secondary | ICD-10-CM | POA: Diagnosis not present

## 2020-10-06 NOTE — Progress Notes (Signed)
[name], [name] ([id number]) Visit Report for 10/05/2020 HPI Details Patient Name: Date of Service: [name] [place]. 10/05/2020 2:15 PM Medical Record Number: [id number] Patient Account Number: [id number] Date of Birth/Sex: Treating RN: 01/03/1927 (86 y.o. [name] [name] Primary Care Provider: Wenda Low Other Clinician: Referring Provider: Treating Provider/Extender: Bryon Lions in Treatment: 9 History of Present Illness HPI Description: ADMISSION 08/03/2020 Patient is a pleasant 86 year old woman accompanied by her granddaughter. She lives on her own but I think there is extensive family support. About 8 weeks ago she was taking off her compression stocking and traumatized the anterior part of her lower extremity on the left. She has seen her primary physician and was initially prescribed Keflex and then a course of clindamycin. She has home health they are applying silver alginate ABDs and kerlix. Not making any progress with the wound and they are here for our review of this. Past medical history includes COPD, rosacea, history of lung cancer status post surgery on the right lung, hypertension, spinal stenosis, compression stocking use fairly reliably. She has advanced home care already ABI on the left in our clinic was 1.19 10/8; patient with a large predominantly venous wound in the left medial lower leg. We used Iodoflex under compression last week. The wound looks slightly better 10/22; 2-week follow-up. She has home health. Largely venous wound in the left medial lower leg slightly smaller we are using Iodoflex under compression and finally have a decent looking wound surface today I changed her to [facility] 11/5; 2-week follow-up. Venous wound in the left medial lower leg surface area is better. She continues to have decent looking wound bed. She has a skin tear on the left mid tibia area which I think was home health initiated. She  complained about one of the wraps being too tight but are seems to work well 11/18; 2-week follow-up. Venous wounds in the left medial lower leg. This is come down in size were using Hydrofera Blue under 3 layer compression. The anterior trauma from last time she was here is closed over. She is concerned about an area on the right medial lower leg but I do not see anything open here. She does have very frail skin however 12/2; 2-week follow-up. Venous wound in the left medial lower leg is healed however she comes in today with a new wound on the right medial lower leg. She says this opened up spontaneously she is not sure how it happened. She does wear compression stockings the leg is not excessively swollen she does not think there was any trauma Electronic Signature(s) Signed: 10/05/2020 5:09:23 PM By: Linton Ham MD Entered By: Linton Ham on 10/05/2020 15:33:58 -------------------------------------------------------------------------------- Physical Exam Details Patient Name: Date of Service: [name] Starks M. 10/05/2020 2:15 PM Medical Record Number: [id number] Patient Account Number: [id number] Date of Birth/Sex: Treating RN: 05-20-1927 (86 y.o. [name] [name] Primary Care Provider: Wenda Low Other Clinician: Referring Provider: Treating Provider/Extender: Bryon Lions in Treatment: 9 Constitutional Sitting or standing Blood Pressure is within target range for patient.. Pulse regular and within target range for patient.Marland Kitchen Respirations regular, non-labored and within target range.. Temperature is normal and within the target range for the patient.Marland Kitchen Appears in no distress. Notes Wound exam; the left medial lower leg wound is healed. However she has a new open area on the right medial lower leg she is uncertain how this happened. Small circular wound. Surface of this looks healthy there  is no need for debridement. Edema control on the right leg was  not all that bad. Peripheral pulses are palpable there is no evidence of cellulitis Electronic Signature(s) Signed: 10/05/2020 5:09:23 PM By: Linton Ham MD Entered By: Linton Ham on 10/05/2020 15:53:23 -------------------------------------------------------------------------------- Physician Orders Details Patient Name: Date of Service: [name], [name] Ames M. 10/05/2020 2:15 PM Medical Record Number: [id number] Patient Account Number: [id number] Date of Birth/Sex: Treating RN: [birth year] (86 y.o. [name] [name] Primary Care Provider: Wenda Low Other Clinician: Referring Provider: Treating Provider/Extender: Bryon Lions in Treatment: 9 Verbal / Phone Orders: No Diagnosis Coding ICD-10 Coding Code Description [phone] Chronic venous hypertension (idiopathic) with ulcer and inflammation of left lower extremity L97.222 Non-pressure chronic ulcer of left 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 Follow-up Appointments Return Appointment in 2 weeks. Bathing/ Shower/ Hygiene May shower and wash wound with soap and water. - with dressing changes. Edema Control - Lymphedema / SCD / Other Elevate legs to the level of the heart or above for 30 minutes daily and/or when sitting, a frequency of: - 3-4 times a day. Avoid standing for long periods of time. Patient to wear own compression stockings every day. - TO LEFT LEG. APPLY IN THE MORNING AND REMOVE AT NIGHT APPLY LOTION . LEFT LEG EVERY NIGHT. Home Health New wound care orders this week; continue Home Health for wound care. May utilize formulary equivalent dressing for wound treatment orders unless otherwise specified. - [place]. Wound Treatment Wound #3 - Lower Leg Wound Laterality: Right, Medial Peri-Wound Care: Sween Lotion (Moisturizing lotion) (Home Health) 2 x Per Week/30 Days Discharge Instructions: Apply moisturizing lotion as directed Prim Dressing: Hydrofera Blue Classic Foam, 2x2 in (Home  Health) 2 x Per Week/30 Days ary Discharge Instructions: MOISTEN WITH SALINE. Apply to wound bed as instructed Secondary Dressing: Woven Gauze Sponge, Non-Sterile 4x4 in (Home Health) 2 x Per Week/30 Days Discharge Instructions: Apply over primary dressing as directed. Compression Wrap: ThreePress (3 layer compression wrap) (Home Health) 2 x Per Week/30 Days Discharge Instructions: Apply three layer compression as directed. Electronic Signature(s) Signed: 10/05/2020 5:09:23 PM By: Linton Ham MD Signed: 10/05/2020 5:54:37 PM By: Deon Pilling Entered By: Deon Pilling on 10/05/2020 15:16:21 -------------------------------------------------------------------------------- Problem List Details Patient Name: Date of Service: [name], [name] Ames M. 10/05/2020 2:15 PM Medical Record Number: [id number] Patient Account Number: [id number] Date of Birth/Sex: Treating RN: 11-Jul-1927 (86 y.o. [name] [name], [name] [name] Primary Care Provider: Wenda Low Other Clinician: Referring Provider: Treating Provider/Extender: Bryon Lions in Treatment: 9 Active Problems ICD-10 Encounter Code Description Active Date MDM Diagnosis I87.332 Chronic venous hypertension (idiopathic) with ulcer and inflammation of left 08/03/2020 No Yes lower extremity L97.818 Non-pressure chronic ulcer of other part of right lower leg with other specified 10/05/2020 No Yes severity Inactive Problems ICD-10 Code Description Active Date Inactive Date L97.222 Non-pressure chronic ulcer of left 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 08/03/2020 08/03/2020 Resolved Problems Electronic Signature(s) Signed: 10/05/2020 5:09:23 PM By: Linton Ham MD Entered By: Linton Ham on 10/05/2020 15:51:54 -------------------------------------------------------------------------------- Progress Note Details Patient Name: Date of Service: [name], [name] Ames M. 10/05/2020 2:15 PM Medical Record Number: [id number] Patient Account  Number: [id number] Date of Birth/Sex: Treating RN: [birth year] (86 y.o. [name] [name] Primary Care Provider: Wenda Low Other Clinician: Referring Provider: Treating Provider/Extender: Bryon Lions in Treatment: 9 Subjective History of Present Illness (HPI) ADMISSION 08/03/2020 Patient is a pleasant 86 year old woman accompanied by her granddaughter. She lives on her  own but I think there is extensive family support. About 8 weeks ago she was taking off her compression stocking and traumatized the anterior part of her lower extremity on the left. She has seen her primary physician and was initially prescribed Keflex and then a course of clindamycin. She has home health they are applying silver alginate ABDs and kerlix. Not making any progress with the wound and they are here for our review of this. Past medical history includes COPD, rosacea, history of lung cancer status post surgery on the right lung, hypertension, spinal stenosis, compression stocking use fairly reliably. She has advanced home care already ABI on the left in our clinic was 1.19 10/8; patient with a large predominantly venous wound in the left medial lower leg. We used Iodoflex under compression last week. The wound looks slightly better 10/22; 2-week follow-up. She has home health. Largely venous wound in the left medial lower leg slightly smaller we are using Iodoflex under compression and finally have a decent looking wound surface today I changed her to [facility] 11/5; 2-week follow-up. Venous wound in the left medial lower leg surface area is better. She continues to have decent looking wound bed. She has a skin tear on the left mid tibia area which I think was home health initiated. She complained about one of the wraps being too tight but are seems to work well 11/18; 2-week follow-up. Venous wounds in the left medial lower leg. This is come down in size were using Hydrofera Blue  under 3 layer compression. The anterior trauma from last time she was here is closed over. She is concerned about an area on the right medial lower leg but I do not see anything open here. She does have very frail skin however 12/2; 2-week follow-up. Venous wound in the left medial lower leg is healed however she comes in today with a new wound on the right medial lower leg. She says this opened up spontaneously she is not sure how it happened. She does wear compression stockings the leg is not excessively swollen she does not think there was any trauma Objective Constitutional Sitting or standing Blood Pressure is within target range for patient.. Pulse regular and within target range for patient.Marland Kitchen Respirations regular, non-labored and within target range.. Temperature is normal and within the target range for the patient.Marland Kitchen Appears in no distress. Vitals Time Taken: 2:41 PM, Height: 61 in, Weight: 155 lbs, BMI: 29.3, Temperature: 98.2 F, Pulse: 67 bpm, Respiratory Rate: 18 breaths/min, Blood Pressure: 134/61 mmHg. General Notes: Wound exam; the left medial lower leg wound is healed. However she has a new open area on the right medial lower leg she is uncertain how this happened. Small circular wound. Surface of this looks healthy there is no need for debridement. Edema control on the right leg was not all that bad. Peripheral pulses are palpable there is no evidence of cellulitis Integumentary (Hair, Skin) Wound #1 status is Open. Original cause of wound was Blister. The wound is located on the Left,Medial Lower Leg. The wound measures 0cm length x 0cm width x 0cm depth; 0cm^2 area and 0cm^3 volume. There is no tunneling or undermining noted. There is a none present amount of drainage noted. The wound margin is distinct with the outline attached to the wound base. There is no granulation within the wound bed. There is no necrotic tissue within the wound bed. Wound #3 status is Open. Original  cause of wound was Gradually Appeared. The wound  is located on the Right,Medial Lower Leg. The wound measures 0.4cm length x 0.4cm width x 0.1cm depth; 0.126cm^2 area and 0.013cm^3 volume. There is Fat Layer (Subcutaneous Tissue) exposed. There is no tunneling or undermining noted. There is a medium amount of serosanguineous drainage noted. There is large (67-100%) red, pink granulation within the wound bed. Assessment Active Problems ICD-10 Chronic venous hypertension (idiopathic) with ulcer and inflammation of left lower extremity Non-pressure chronic ulcer of other part of right lower leg with other specified severity Procedures Wound #3 Pre-procedure diagnosis of Wound #3 is a Venous Leg Ulcer located on the Right,Medial Lower Leg . There was a Three Layer Compression Therapy Procedure by Baruch Gouty, RN. Post procedure Diagnosis Wound #3: Same as Pre-Procedure Plan Follow-up Appointments: Return Appointment in 2 weeks. Bathing/ Shower/ Hygiene: May shower and wash wound with soap and water. - with dressing changes. Edema Control - Lymphedema / SCD / Other: Elevate legs to the level of the heart or above for 30 minutes daily and/or when sitting, a frequency of: - 3-4 times a day. Avoid standing for long periods of time. Patient to wear own compression stockings every day. - TO LEFT LEG. APPLY IN THE MORNING AND REMOVE AT NIGHT APPLY LOTION LEFT LEG . EVERY NIGHT . Home Health: New wound care orders this week; continue Home Health for wound care. May utilize formulary equivalent dressing for wound treatment orders unless otherwise specified. - [place]. WOUND #3: - Lower Leg Wound Laterality: Right, Medial Peri-Wound Care: Sween Lotion (Moisturizing lotion) (Home Health) 2 x Per Week/30 Days Discharge Instructions: Apply moisturizing lotion as directed Prim Dressing: Hydrofera Blue Classic Foam, 2x2 in (Home Health) 2 x Per Week/30 Days ary Discharge Instructions:  MOISTEN WITH SALINE. Apply to wound bed as instructed Secondary Dressing: Woven Gauze Sponge, Non-Sterile 4x4 in (Home Health) 2 x Per Week/30 Days Discharge Instructions: Apply over primary dressing as directed. Com pression Wrap: ThreePress (3 layer compression wrap) (Home Health) 2 x Per Week/30 Days Discharge Instructions: Apply three layer compression as directed. 1. We put her own compression stocking back on her left leg. Her original wound in this clinic is healed 2. She has a new wound on the right anterior lower leg in the setting of her stocking but without a lot of uncontrolled edema. We will use Hydrofera Blue under 3 layer compression. I am not exactly sure how this happened however minor trauma certainly is possible, scratching it said Electronic Signature(s) Signed: 10/05/2020 5:09:23 PM By: Linton Ham MD Entered By: Linton Ham on 10/05/2020 15:55:03 -------------------------------------------------------------------------------- SuperBill Details Patient Name: Date of Service: [name], [name] Ames M. 10/05/2020 Medical Record Number: [id number] Patient Account Number: [id number] Date of Birth/Sex: Treating RN: 27-Apr-1927 (86 y.o. [name] [name] Primary Care Provider: Wenda Low Other Clinician: Referring Provider: Treating Provider/Extender: Bryon Lions in Treatment: 9 Diagnosis Coding ICD-10 Codes Code Description [phone] Chronic venous hypertension (idiopathic) with ulcer and inflammation of left lower extremity L97.818 Non-pressure chronic ulcer of other part of right lower leg with other specified severity Facility Procedures CPT4 Code: [id number] Description: (Facility Use Only) [phone] - APPLY MULTLAY COMPRS LWR RT LEG Modifier: Quantity: 1 Physician Procedures : CPT4 Code Description Modifier [id number] - WC PHYS LEVEL 3 - EST PT ICD-10 Diagnosis Description L97.818 Non-pressure chronic ulcer of other part of right lower leg  with other specified severity I87.332 Chronic venous hypertension (idiopathic) with  ulcer and inflammation of left lower extremity Quantity: 1 Electronic Signature(s)  Signed: 10/05/2020 5:54:37 PM By: Deon Pilling Signed: 10/06/2020 12:44:20 PM By: Linton Ham MD Previous Signature: 10/05/2020 5:09:23 PM Version By: Linton Ham MD Entered By: Deon Pilling on 10/05/2020 17:18:54

## 2020-10-10 DIAGNOSIS — S81802D Unspecified open wound, left lower leg, subsequent encounter: Secondary | ICD-10-CM | POA: Diagnosis not present

## 2020-10-10 DIAGNOSIS — J449 Chronic obstructive pulmonary disease, unspecified: Secondary | ICD-10-CM | POA: Diagnosis not present

## 2020-10-10 DIAGNOSIS — E785 Hyperlipidemia, unspecified: Secondary | ICD-10-CM | POA: Diagnosis not present

## 2020-10-10 DIAGNOSIS — I87309 Chronic venous hypertension (idiopathic) without complications of unspecified lower extremity: Secondary | ICD-10-CM | POA: Diagnosis not present

## 2020-10-10 DIAGNOSIS — I129 Hypertensive chronic kidney disease with stage 1 through stage 4 chronic kidney disease, or unspecified chronic kidney disease: Secondary | ICD-10-CM | POA: Diagnosis not present

## 2020-10-10 DIAGNOSIS — N183 Chronic kidney disease, stage 3 unspecified: Secondary | ICD-10-CM | POA: Diagnosis not present

## 2020-10-12 DIAGNOSIS — S81802D Unspecified open wound, left lower leg, subsequent encounter: Secondary | ICD-10-CM | POA: Diagnosis not present

## 2020-10-12 DIAGNOSIS — I129 Hypertensive chronic kidney disease with stage 1 through stage 4 chronic kidney disease, or unspecified chronic kidney disease: Secondary | ICD-10-CM | POA: Diagnosis not present

## 2020-10-12 DIAGNOSIS — N183 Chronic kidney disease, stage 3 unspecified: Secondary | ICD-10-CM | POA: Diagnosis not present

## 2020-10-12 DIAGNOSIS — E785 Hyperlipidemia, unspecified: Secondary | ICD-10-CM | POA: Diagnosis not present

## 2020-10-12 DIAGNOSIS — I87309 Chronic venous hypertension (idiopathic) without complications of unspecified lower extremity: Secondary | ICD-10-CM | POA: Diagnosis not present

## 2020-10-12 DIAGNOSIS — J449 Chronic obstructive pulmonary disease, unspecified: Secondary | ICD-10-CM | POA: Diagnosis not present

## 2020-10-16 DIAGNOSIS — J449 Chronic obstructive pulmonary disease, unspecified: Secondary | ICD-10-CM | POA: Diagnosis not present

## 2020-10-16 DIAGNOSIS — E785 Hyperlipidemia, unspecified: Secondary | ICD-10-CM | POA: Diagnosis not present

## 2020-10-16 DIAGNOSIS — S81802D Unspecified open wound, left lower leg, subsequent encounter: Secondary | ICD-10-CM | POA: Diagnosis not present

## 2020-10-16 DIAGNOSIS — N183 Chronic kidney disease, stage 3 unspecified: Secondary | ICD-10-CM | POA: Diagnosis not present

## 2020-10-16 DIAGNOSIS — I87309 Chronic venous hypertension (idiopathic) without complications of unspecified lower extremity: Secondary | ICD-10-CM | POA: Diagnosis not present

## 2020-10-16 DIAGNOSIS — I129 Hypertensive chronic kidney disease with stage 1 through stage 4 chronic kidney disease, or unspecified chronic kidney disease: Secondary | ICD-10-CM | POA: Diagnosis not present

## 2020-10-19 ENCOUNTER — Encounter (HOSPITAL_BASED_OUTPATIENT_CLINIC_OR_DEPARTMENT_OTHER): Payer: Medicare Other | Admitting: Internal Medicine

## 2020-10-23 DIAGNOSIS — N183 Chronic kidney disease, stage 3 unspecified: Secondary | ICD-10-CM | POA: Diagnosis not present

## 2020-10-23 DIAGNOSIS — J449 Chronic obstructive pulmonary disease, unspecified: Secondary | ICD-10-CM | POA: Diagnosis not present

## 2020-10-23 DIAGNOSIS — I129 Hypertensive chronic kidney disease with stage 1 through stage 4 chronic kidney disease, or unspecified chronic kidney disease: Secondary | ICD-10-CM | POA: Diagnosis not present

## 2020-10-23 DIAGNOSIS — E785 Hyperlipidemia, unspecified: Secondary | ICD-10-CM | POA: Diagnosis not present

## 2020-10-23 DIAGNOSIS — I87309 Chronic venous hypertension (idiopathic) without complications of unspecified lower extremity: Secondary | ICD-10-CM | POA: Diagnosis not present

## 2020-10-23 DIAGNOSIS — S81802D Unspecified open wound, left lower leg, subsequent encounter: Secondary | ICD-10-CM | POA: Diagnosis not present

## 2020-10-26 ENCOUNTER — Encounter (HOSPITAL_BASED_OUTPATIENT_CLINIC_OR_DEPARTMENT_OTHER): Payer: Medicare Other | Admitting: Internal Medicine

## 2020-10-26 ENCOUNTER — Other Ambulatory Visit: Payer: Self-pay

## 2020-10-26 DIAGNOSIS — I87332 Chronic venous hypertension (idiopathic) with ulcer and inflammation of left lower extremity: Secondary | ICD-10-CM | POA: Diagnosis not present

## 2020-10-26 DIAGNOSIS — I890 Lymphedema, not elsewhere classified: Secondary | ICD-10-CM | POA: Diagnosis not present

## 2020-10-26 DIAGNOSIS — L97818 Non-pressure chronic ulcer of other part of right lower leg with other specified severity: Secondary | ICD-10-CM | POA: Diagnosis not present

## 2020-10-26 DIAGNOSIS — Z85118 Personal history of other malignant neoplasm of bronchus and lung: Secondary | ICD-10-CM | POA: Diagnosis not present

## 2020-10-26 NOTE — Progress Notes (Signed)
DARNISHA, VERNET ([id number]) Visit Report for 10/26/2020 Arrival Information Details Patient Name: Date of Service: [name] [place]. 10/26/2020 2:00 PM Medical Record Number: [id number] Patient Account Number: [id number] Date of Birth/Sex: Treating RN: 04-10-1927 (86 y.o. [name], [name] Primary Care Cadin Luka: Wenda Low Other Clinician: Referring Raisha Brabender: Treating Krina Mraz/Extender: Bryon Lions in Treatment: 12 Visit Information History Since Last Visit Added or deleted any medications: No Patient Arrived: Ambulatory Any new allergies or adverse reactions: No Arrival Time: 14:23 Had a fall or experienced change in No Accompanied By: self activities of daily living that may affect Transfer Assistance: None risk of falls: Patient Identification Verified: Yes Signs or symptoms of abuse/neglect since last visito No Secondary Verification Process Completed: Yes Hospitalized since last visit: No Patient Requires Transmission-Based Precautions: No Implantable device outside of the clinic excluding No Patient Has Alerts: No cellular tissue based products placed in the center since last visit: Has Dressing in Place as Prescribed: Yes Pain Present Now: No Electronic Signature(s) Signed: 10/26/2020 3:41:52 PM By: Sandre Kitty Entered By: Sandre Kitty on 10/26/2020 14:23:42 -------------------------------------------------------------------------------- Clinic Level of Care Assessment Details Patient Name: Date of Service: [name]. 10/26/2020 2:00 PM Medical Record Number: [id number] Patient Account Number: [id number] Date of Birth/Sex: Treating RN: 1927/10/02 (86 y.o. [name], [name] Primary Care Tahjai Schetter: Wenda Low Other Clinician: Referring Toma Erichsen: Treating Sahar Ryback/Extender: Bryon Lions in Treatment: 12 Clinic Level of Care Assessment Items TOOL 4 Quantity Score X- 1 0 Use when only an  EandM is performed on FOLLOW-UP visit ASSESSMENTS - Nursing Assessment / Reassessment X- 1 10 Reassessment of Co-morbidities (includes updates in patient status) X- 1 5 Reassessment of Adherence to Treatment Plan ASSESSMENTS - Wound and Skin A ssessment / Reassessment X - Simple Wound Assessment / Reassessment - one wound 1 5 []  - 0 Complex Wound Assessment / Reassessment - multiple wounds X- 1 10 Dermatologic / Skin Assessment (not related to wound area) ASSESSMENTS - Focused Assessment X- 1 5 Circumferential Edema Measurements - multi extremities X- 1 10 Nutritional Assessment / Counseling / Intervention []  - 0 Lower Extremity Assessment (monofilament, tuning fork, pulses) []  - 0 Peripheral Arterial Disease Assessment (using hand held doppler) ASSESSMENTS - Ostomy and/or Continence Assessment and Care []  - 0 Incontinence Assessment and Management []  - 0 Ostomy Care Assessment and Management (repouching, etc.) PROCESS - Coordination of Care X - Simple Patient / Family Education for ongoing care 1 15 []  - 0 Complex (extensive) Patient / Family Education for ongoing care X- 1 10 Staff obtains Consents, Records, T Results / Process Orders est X- 1 10 Staff telephones HHA, Nursing Homes / Clarify orders / etc []  - 0 Routine Transfer to another Facility (non-emergent condition) []  - 0 Routine Hospital Admission (non-emergent condition) []  - 0 New Admissions / Biomedical engineer / Ordering NPWT Apligraf, etc. , []  - 0 Emergency Hospital Admission (emergent condition) X- 1 10 Simple Discharge Coordination []  - 0 Complex (extensive) Discharge Coordination PROCESS - Special Needs []  - 0 Pediatric / Minor Patient Management []  - 0 Isolation Patient Management []  - 0 Hearing / Language / Visual special needs []  - 0 Assessment of Community assistance (transportation, D/C planning, etc.) []  - 0 Additional assistance / Altered mentation []  - 0 Support Surface(s)  Assessment (bed, cushion, seat, etc.) INTERVENTIONS - Wound Cleansing / Measurement X - Simple Wound Cleansing - one wound 1 5 []  - 0 Complex Wound Cleansing - multiple wounds  X- 1 5 Wound Imaging (photographs - any number of wounds) []  - 0 Wound Tracing (instead of photographs) X- 1 5 Simple Wound Measurement - one wound []  - 0 Complex Wound Measurement - multiple wounds INTERVENTIONS - Wound Dressings []  - 0 Small Wound Dressing one or multiple wounds []  - 0 Medium Wound Dressing one or multiple wounds []  - 0 Large Wound Dressing one or multiple wounds []  - 0 Application of Medications - topical []  - 0 Application of Medications - injection INTERVENTIONS - Miscellaneous []  - 0 External ear exam []  - 0 Specimen Collection (cultures, biopsies, blood, body fluids, etc.) []  - 0 Specimen(s) / Culture(s) sent or taken to Lab for analysis []  - 0 Patient Transfer (multiple staff / Civil Service fast streamer / Similar devices) []  - 0 Simple Staple / Suture removal (25 or less) []  - 0 Complex Staple / Suture removal (26 or more) []  - 0 Hypo / Hyperglycemic Management (close monitor of Blood Glucose) []  - 0 Ankle / Brachial Index (ABI) - do not check if billed separately X- 1 5 Vital Signs Has the patient been seen at the hospital within the last three years: Yes Total Score: 110 Level Of Care: New/Established - Level 3 Electronic Signature(s) Signed: 10/26/2020 4:19:56 PM By: Deon Pilling Entered By: Deon Pilling on 10/26/2020 15:57:47 -------------------------------------------------------------------------------- Encounter Discharge Information Details Patient Name: Date of Service: [name], [name] 10/26/2020 2:00 PM Medical Record Number: [id number] Patient Account Number: [id number] Date of Birth/Sex: Treating RN: 26-Apr-1927 (86 y.o. [name] Primary Care Mirranda Monrroy: Wenda Low Other Clinician: Referring [place] Hedman: Treating Currie Dennin/Extender: Bryon Lions in Treatment: 12 Encounter Discharge Information Items Discharge Condition: Stable Ambulatory Status: Wheelchair Discharge Destination: Home Transportation: Private Auto Accompanied By: grandmother Schedule Follow-up Appointment: No Clinical Summary of Care: Electronic Signature(s) Signed: 10/26/2020 4:19:56 PM By: Deon Pilling Entered By: Deon Pilling on 10/26/2020 15:58:15 -------------------------------------------------------------------------------- Lower Extremity Assessment Details Patient Name: Date of Service: [name]. 10/26/2020 2:00 PM Medical Record Number: [id number] Patient Account Number: [id number] Date of Birth/Sex: Treating RN: [birth year] (86 y.o. [name] Primary Care Bertel Venard: Wenda Low Other Clinician: Referring Avanni Turnbaugh: Treating Ruben Mahler/Extender: Bryon Lions in Treatment: 12 Edema Assessment Assessed: Shirlyn Goltz: No] Patrice Paradise: No] Edema: [Left: Ye] [Right: s] Calf Left: Right: Point of Measurement: From Medial Instep 35 cm Ankle Left: Right: Point of Measurement: From Medial Instep 19 cm Electronic Signature(s) Signed: 10/26/2020 4:19:56 PM By: Deon Pilling Entered By: Deon Pilling on 10/26/2020 14:41:53 -------------------------------------------------------------------------------- Multi Wound Chart Details Patient Name: Date of Service: [name], [name] 10/26/2020 2:00 PM Medical Record Number: [id number] Patient Account Number: [id number] Date of Birth/Sex: Treating RN: 1927-08-30 (86 y.o. [name], [name] Primary Care Righteous Claiborne: Wenda Low Other Clinician: Referring Chantry Headen: Treating Naheim Burgen/Extender: Bryon Lions in Treatment: 12 Vital Signs Height(in): 61 Pulse(bpm): 64 Weight(lbs): 155 Blood Pressure(mmHg): 133/72 Body Mass Index(BMI): 29 Temperature(F): 98.2 Respiratory Rate(breaths/min): 18 Photos: [3:No Photos Right,  Medial Lower Leg] [N/A:N/A N/A] Wound Location: [3:Gradually Appeared] [N/A:N/A] Wounding Event: [3:Venous Leg Ulcer] [N/A:N/A] Primary Etiology: [3:10/03/2020] [N/A:N/A] Date Acquired: [3:3] [N/A:N/A] Weeks of Treatment: [3:Open] [N/A:N/A] Wound Status: [3:0x0x0] [N/A:N/A] Measurements L x W x D (cm) [3:0] [N/A:N/A] A (cm) : rea [3:0] [N/A:N/A] Volume (cm) : [3:100.00%] [N/A:N/A] % Reduction in A rea: [3:100.00%] [N/A:N/A] % Reduction in Volume: [3:Full Thickness Without Exposed] [N/A:N/A] Classification: [3:Support Structures] Treatment Notes Electronic Signature(s) Signed: 10/26/2020 3:50:51 PM By: Linton Ham MD Signed: 10/26/2020  4:19:56 PM By: Deon Pilling Entered By: Linton Ham on 10/26/2020 14:47:40 -------------------------------------------------------------------------------- Multi-Disciplinary Care Plan Details Patient Name: Date of Service: [name], [name] 10/26/2020 2:00 PM Medical Record Number: [id number] Patient Account Number: [id number] Date of Birth/Sex: Treating RN: 12/11/1926 (86 y.o. [name] Primary Care Trudi Morgenthaler: Wenda Low Other Clinician: Referring Ciearra Rufo: Treating Zachariah Pavek/Extender: Bryon Lions in Treatment: 12 Active Inactive Electronic Signature(s) Signed: 10/26/2020 4:19:56 PM By: Deon Pilling Signed: 10/26/2020 4:19:56 PM By: Deon Pilling Entered By: Deon Pilling on 10/26/2020 14:43:43 -------------------------------------------------------------------------------- Pain Assessment Details Patient Name: Date of Service: [name], [name] 10/26/2020 2:00 PM Medical Record Number: [id number] Patient Account Number: [id number] Date of Birth/Sex: Treating RN: [birth year] (86 y.o. [name] Primary Care Kamori Barbier: Wenda Low Other Clinician: Referring Eliott Amparan: Treating Rosa Wyly/Extender: Bryon Lions in Treatment: 12 Active Problems Location of  Pain Severity and Description of Pain Patient Has Paino No Site Locations Pain Management and Medication Current Pain Management: Electronic Signature(s) Signed: 10/26/2020 3:41:52 PM By: Sandre Kitty Signed: 10/26/2020 4:19:56 PM By: Deon Pilling Entered By: Sandre Kitty on 10/26/2020 14:24:14 -------------------------------------------------------------------------------- Patient/Caregiver Education Details Patient Name: Date of Service: [name] 12/23/2021andnbsp2:00 PM Medical Record Number: [id number] Patient Account Number: [id number] Date of Birth/Gender: Treating RN: 1927/02/27 (86 y.o. [name] Primary Care Physician: Wenda Low Other Clinician: Referring Physician: Treating Physician/Extender: Bryon Lions in Treatment: 12 Education Assessment Education Provided To: Patient Education Topics Provided Wound/Skin Impairment: Handouts: Skin Care Do's and Dont's Methods: Explain/Verbal Responses: Reinforcements needed Electronic Signature(s) Signed: 10/26/2020 4:19:56 PM By: Deon Pilling Entered By: Deon Pilling on 10/26/2020 14:44:53 -------------------------------------------------------------------------------- Wound Assessment Details Patient Name: Date of Service: [name] 10/26/2020 2:00 PM Medical Record Number: [id number] Patient Account Number: [id number] Date of Birth/Sex: Treating RN: 1927/05/19 (86 y.o. [name], [name] Primary Care Jaion Lagrange: Wenda Low Other Clinician: Referring Sylena Lotter: Treating Amyjo Mizrachi/Extender: Bryon Lions in Treatment: 12 Wound Status Wound Number: 3 Primary Etiology: Venous Leg Ulcer Wound Location: Right, Medial Lower Leg Wound Status: Open Wounding Event: Gradually Appeared Date Acquired: 10/03/2020 Weeks Of Treatment: 3 Clustered Wound: No Wound Measurements Length: (cm) Width: (cm) Depth: (cm) Area: (cm) Volume:  (cm) 0 % Reduction in Area: 100% 0 % Reduction in Volume: 100% 0 0 0 Wound Description Classification: Full Thickness Without Exposed Support Structur es Electronic Signature(s) Signed: 10/26/2020 3:41:52 PM By: Sandre Kitty Signed: 10/26/2020 4:19:56 PM By: Deon Pilling Entered By: Sandre Kitty on 10/26/2020 14:32:27 -------------------------------------------------------------------------------- Vitals Details Patient Name: Date of Service: [name], [name] 10/26/2020 2:00 PM Medical Record Number: [id number] Patient Account Number: [id number] Date of Birth/Sex: Treating RN: Dec 06, 1926 (86 y.o. [name], [name] Primary Care Platon Arocho: Wenda Low Other Clinician: Referring Duquan Gillooly: Treating Baby Stairs/Extender: Bryon Lions in Treatment: 12 Vital Signs Time Taken: 14:23 Temperature (F): 98.2 Height (in): 61 Pulse (bpm): 64 Weight (lbs): 155 Respiratory Rate (breaths/min): 18 Body Mass Index (BMI): 29.3 Blood Pressure (mmHg): 133/72 Reference Range: 80 - 120 mg / dl Electronic Signature(s) Signed: 10/26/2020 3:41:52 PM By: Sandre Kitty Entered By: Sandre Kitty on 10/26/2020 14:23:59

## 2020-10-30 DIAGNOSIS — E785 Hyperlipidemia, unspecified: Secondary | ICD-10-CM | POA: Diagnosis not present

## 2020-10-30 DIAGNOSIS — N183 Chronic kidney disease, stage 3 unspecified: Secondary | ICD-10-CM | POA: Diagnosis not present

## 2020-10-30 DIAGNOSIS — I129 Hypertensive chronic kidney disease with stage 1 through stage 4 chronic kidney disease, or unspecified chronic kidney disease: Secondary | ICD-10-CM | POA: Diagnosis not present

## 2020-10-30 DIAGNOSIS — J449 Chronic obstructive pulmonary disease, unspecified: Secondary | ICD-10-CM | POA: Diagnosis not present

## 2020-10-30 DIAGNOSIS — S81802D Unspecified open wound, left lower leg, subsequent encounter: Secondary | ICD-10-CM | POA: Diagnosis not present

## 2020-10-30 DIAGNOSIS — I87309 Chronic venous hypertension (idiopathic) without complications of unspecified lower extremity: Secondary | ICD-10-CM | POA: Diagnosis not present

## 2020-10-31 NOTE — Progress Notes (Signed)
[name] [name] ([id number]) Visit Report for 10/26/2020 HPI Details Patient Name: Date of Service: [name] [name] [place]. 10/26/2020 2:00 PM Medical Record Number: [id number] Patient Account Number: [id number] Date of Birth/Sex: Treating RN: [birth year] (86 y.o. [name] [name] Primary Care Provider: Wenda Low Other Clinician: Referring Provider: Treating Provider/Extender: Bryon Lions in Treatment: 12 History of Present Illness HPI Description: ADMISSION 08/03/2020 Patient is a pleasant 86 year old woman accompanied by her granddaughter. She lives on her own but I think there is extensive family support. About 8 weeks ago she was taking off her compression stocking and traumatized the anterior part of her lower extremity on the left. She has seen her primary physician and was initially prescribed Keflex and then a course of clindamycin. She has home health they are applying silver alginate ABDs and kerlix. Not making any progress with the wound and they are here for our review of this. Past medical history includes COPD, rosacea, history of lung cancer status post surgery on the right lung, hypertension, spinal stenosis, compression stocking use fairly reliably. She has advanced home care already ABI on the left in our clinic was 1.19 10/8; patient with a large predominantly venous wound in the left medial lower leg. We used Iodoflex under compression last week. The wound looks slightly better 10/22; 2-week follow-up. She has home health. Largely venous wound in the left medial lower leg slightly smaller we are using Iodoflex under compression and finally have a decent looking wound surface today I changed her to [facility] 11/5; 2-week follow-up. Venous wound in the left medial lower leg surface area is better. She continues to have decent looking wound bed. She has a skin tear on the left mid tibia area which I think was home health initiated. She  complained about one of the wraps being too tight but are seems to work well 11/18; 2-week follow-up. Venous wounds in the left medial lower leg. This is come down in size were using Hydrofera Blue under 3 layer compression. The anterior trauma from last time she was here is closed over. She is concerned about an area on the right medial lower leg but I do not see anything open here. She does have very frail skin however 12/2; 2-week follow-up. Venous wound in the left medial lower leg is healed however she comes in today with a new wound on the right medial lower leg. She says this opened up spontaneously she is not sure how it happened. She does wear compression stockings the leg is not excessively swollen she does not think there was any trauma 12/23; 2-week follow-up. Venous wound in the left medial lower leg is healed as is the area on the right medial lower leg from last time. Our few reappears today with a small skin tear on the left upper leg presumably a nail trauma from putting on her stockings this does not look serious and I do not think should alter our intention of discharging her today. Electronic Signature(s) Signed: 10/26/2020 3:50:51 PM By: Linton Ham MD Entered By: Linton Ham on 10/26/2020 14:48:27 -------------------------------------------------------------------------------- Physical Exam Details Patient Name: Date of Service: [name] [name], [name] Ames M. 10/26/2020 2:00 PM Medical Record Number: [id number] Patient Account Number: [id number] Date of Birth/Sex: Treating RN: 1927/04/22 (86 y.o. [name] [name] Primary Care Provider: Wenda Low Other Clinician: Referring Provider: Treating Provider/Extender: Bryon Lions in Treatment: 12 Constitutional Sitting or standing Blood Pressure is within target range for patient.. Pulse regular  and within target range for patient.Marland Kitchen Respirations regular, non-labored and within target range..  Temperature is normal and within the target range for the patient.Marland Kitchen Appears in no distress. Cardiovascular Pedal pulses are palpable. Edema is fairly well controlled. Edema is well controlled. Notes Wound exam; the left medial lower leg is healed the right medial lower leg is healed however she has a new area just lateral to the upper tibia. This is a small skin tear probably nail trauma when she was putting on her stockings or taking them off. There is no evidence of surrounding infection Electronic Signature(s) Signed: 10/26/2020 3:50:51 PM By: Linton Ham MD Entered By: Linton Ham on 10/26/2020 14:49:51 -------------------------------------------------------------------------------- Physician Orders Details Patient Name: Date of Service: [name] [name], [name] Ames M. 10/26/2020 2:00 PM Medical Record Number: [id number] Patient Account Number: [id number] Date of Birth/Sex: Treating RN: 1927-09-24 (86 y.o. [name] [name] Primary Care Provider: Wenda Low Other Clinician: Referring Provider: Treating Provider/Extender: Bryon Lions in Treatment: 12 Verbal / Phone Orders: No Diagnosis Coding Discharge From [facility] Services Discharge from [place] Edema Control - Lymphedema / SCD / Other Elevate legs to the level of the heart or above for 30 minutes daily and/or when sitting, a frequency of: - 3-4 times a day throughout the day. Avoid standing for long periods of time. Exercise regularly Moisturize legs daily. - both legs every night. Compression stocking or Garment 20-30 mm/Hg pressure to: - patient to wear compression stockings both legs. Apply in the morning and remove at night. [place] [facility] for wound care. - [facility] from wound care stand point. Electronic Signature(s) Signed: 10/26/2020 3:50:51 PM By: Linton Ham MD Signed: 10/26/2020 4:19:56 PM By: Deon Pilling Entered By: Deon Pilling on 10/26/2020  14:43:25 -------------------------------------------------------------------------------- Problem List Details Patient Name: Date of Service: [name] [name], [name] Ames M. 10/26/2020 2:00 PM Medical Record Number: [id number] Patient Account Number: [id number] Date of Birth/Sex: Treating RN: May 22, 1927 (86 y.o. [name], Tammi Klippel Primary Care Provider: Wenda Low Other Clinician: Referring Provider: Treating Provider/Extender: Bryon Lions in Treatment: 12 Active Problems ICD-10 Encounter Code Description Active Date MDM Diagnosis I87.332 Chronic venous hypertension (idiopathic) with ulcer and inflammation of left 08/03/2020 No Yes lower extremity L97.818 Non-pressure chronic ulcer of other part of right lower leg with other specified 10/05/2020 No Yes severity Inactive Problems ICD-10 Code Description Active Date Inactive Date L97.222 Non-pressure chronic ulcer of left 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 08/03/2020 08/03/2020 Resolved Problems Electronic Signature(s) Signed: 10/26/2020 3:50:51 PM By: Linton Ham MD Entered By: Linton Ham on 10/26/2020 14:47:26 -------------------------------------------------------------------------------- Progress Note Details Patient Name: Date of Service: [name] [name], [name] Ames M. 10/26/2020 2:00 PM Medical Record Number: [id number] Patient Account Number: [id number] Date of Birth/Sex: Treating RN: 08-25-1927 (86 y.o. [name] [name] Primary Care Provider: Wenda Low Other Clinician: Referring Provider: Treating Provider/Extender: Bryon Lions in Treatment: 12 Subjective History of Present Illness (HPI) ADMISSION 08/03/2020 Patient is a pleasant 86 year old woman accompanied by her granddaughter. She lives on her own but I think there is extensive family support. About 8 weeks ago she was taking off her compression stocking and traumatized the anterior part of her lower extremity on the left. She has  seen her primary physician and was initially prescribed Keflex and then a course of clindamycin. She has home health they are applying silver alginate ABDs and kerlix. Not making any progress with the wound and they are here for our review of  this. Past medical history includes COPD, rosacea, history of lung cancer status post surgery on the right lung, hypertension, spinal stenosis, compression stocking use fairly reliably. She has advanced home care already ABI on the left in our clinic was 1.19 10/8; patient with a large predominantly venous wound in the left medial lower leg. We used Iodoflex under compression last week. The wound looks slightly better 10/22; 2-week follow-up. She has home health. Largely venous wound in the left medial lower leg slightly smaller we are using Iodoflex under compression and finally have a decent looking wound surface today I changed her to [facility] 11/5; 2-week follow-up. Venous wound in the left medial lower leg surface area is better. She continues to have decent looking wound bed. She has a skin tear on the left mid tibia area which I think was home health initiated. She complained about one of the wraps being too tight but are seems to work well 11/18; 2-week follow-up. Venous wounds in the left medial lower leg. This is come down in size were using Hydrofera Blue under 3 layer compression. The anterior trauma from last time she was here is closed over. She is concerned about an area on the right medial lower leg but I do not see anything open here. She does have very frail skin however 12/2; 2-week follow-up. Venous wound in the left medial lower leg is healed however she comes in today with a new wound on the right medial lower leg. She says this opened up spontaneously she is not sure how it happened. She does wear compression stockings the leg is not excessively swollen she does not think there was any trauma 12/23; 2-week follow-up. Venous wound  in the left medial lower leg is healed as is the area on the right medial lower leg from last time. Our few reappears today with a small skin tear on the left upper leg presumably a nail trauma from putting on her stockings this does not look serious and I do not think should alter our intention of discharging her today. Objective Constitutional Sitting or standing Blood Pressure is within target range for patient.. Pulse regular and within target range for patient.Marland Kitchen Respirations regular, non-labored and within target range.. Temperature is normal and within the target range for the patient.Marland Kitchen Appears in no distress. Vitals Time Taken: 2:23 PM, Height: 61 in, Weight: 155 lbs, BMI: 29.3, Temperature: 98.2 F, Pulse: 64 bpm, Respiratory Rate: 18 breaths/min, Blood Pressure: 133/72 mmHg. Cardiovascular Pedal pulses are palpable. Edema is fairly well controlled. Edema is well controlled. General Notes: Wound exam; the left medial lower leg is healed the right medial lower leg is healed however she has a new area just lateral to the upper tibia. This is a small skin tear probably nail trauma when she was putting on her stockings or taking them off. There is no evidence of surrounding infection Integumentary (Hair, Skin) Wound #3 status is Open. Original cause of wound was Gradually Appeared. The wound is located on the Right,Medial Lower Leg. The wound measures 0cm length x 0cm width x 0cm depth; 0cm^2 area and 0cm^3 volume. Assessment Active Problems ICD-10 Chronic venous hypertension (idiopathic) with ulcer and inflammation of left lower extremity Non-pressure chronic ulcer of other part of right lower leg with other specified severity Plan Discharge From [facility] Services: Discharge from [place] Edema Control - Lymphedema / SCD / Other: Elevate legs to the level of the heart or above for 30 minutes daily and/or when  sitting, a frequency of: - 3-4 times a day throughout the day. Avoid  standing for long periods of time. Exercise regularly Moisturize legs daily. - both legs every night. Compression stocking or Garment 20-30 mm/Hg pressure to: - patient to wear compression stockings both legs. Apply in the morning and remove at night. Home Health: [facility] for wound care. - [facility] from wound care stand point. #1 the patient to be discharged from the clinic. Topical antibiotic and a Band-Aid to the small skin tear in her left upper leg. 2. The stockings appear to be controlling her edema. We advised cutting her nails to avoid further skin trauma Electronic Signature(s) Signed: 10/26/2020 3:50:51 PM By: Linton Ham MD Entered By: Linton Ham on 10/26/2020 14:51:14 -------------------------------------------------------------------------------- SuperBill Details Patient Name: Date of Service: [name] [name], [name] Ames M. 10/26/2020 Medical Record Number: [id number] Patient Account Number: [id number] Date of Birth/Sex: Treating RN: 1927-01-30 (86 y.o. [name] [name] Primary Care Provider: Wenda Low Other Clinician: Referring Provider: Treating Provider/Extender: Bryon Lions in Treatment: 12 Diagnosis Coding ICD-10 Codes Code Description [phone] Chronic venous hypertension (idiopathic) with ulcer and inflammation of left lower extremity L97.818 Non-pressure chronic ulcer of other part of right lower leg with other specified severity Facility Procedures CPT4 Code: [id number] Description: 99213 - WOUND CARE VISIT-LEV 3 EST PT Modifier: Quantity: 1 Physician Procedures : CPT4 Code Description Modifier [id number] - WC PHYS LEVEL 2 - EST PT ICD-10 Diagnosis Description I87.332 Chronic venous hypertension (idiopathic) with ulcer and inflammation of left lower extremity L97.818 Non-pressure chronic ulcer of other part  of right lower leg with other specified severity Quantity: 1 Electronic Signature(s) Signed:  10/26/2020 4:19:56 PM By: Deon Pilling Signed: 10/31/2020 2:27:07 PM By: Linton Ham MD Previous Signature: 10/26/2020 3:50:51 PM Version By: Linton Ham MD Entered By: Deon Pilling on 10/26/2020 15:57:54

## 2020-11-02 DIAGNOSIS — N183 Chronic kidney disease, stage 3 unspecified: Secondary | ICD-10-CM | POA: Diagnosis not present

## 2020-11-02 DIAGNOSIS — I87309 Chronic venous hypertension (idiopathic) without complications of unspecified lower extremity: Secondary | ICD-10-CM | POA: Diagnosis not present

## 2020-11-02 DIAGNOSIS — E785 Hyperlipidemia, unspecified: Secondary | ICD-10-CM | POA: Diagnosis not present

## 2020-11-02 DIAGNOSIS — S81802D Unspecified open wound, left lower leg, subsequent encounter: Secondary | ICD-10-CM | POA: Diagnosis not present

## 2020-11-02 DIAGNOSIS — I129 Hypertensive chronic kidney disease with stage 1 through stage 4 chronic kidney disease, or unspecified chronic kidney disease: Secondary | ICD-10-CM | POA: Diagnosis not present

## 2020-11-02 DIAGNOSIS — J449 Chronic obstructive pulmonary disease, unspecified: Secondary | ICD-10-CM | POA: Diagnosis not present

## 2020-11-06 ENCOUNTER — Ambulatory Visit: Payer: Medicare Other | Admitting: Internal Medicine

## 2020-11-23 DIAGNOSIS — M1990 Unspecified osteoarthritis, unspecified site: Secondary | ICD-10-CM | POA: Diagnosis not present

## 2020-11-23 DIAGNOSIS — K219 Gastro-esophageal reflux disease without esophagitis: Secondary | ICD-10-CM | POA: Diagnosis not present

## 2020-11-23 DIAGNOSIS — J449 Chronic obstructive pulmonary disease, unspecified: Secondary | ICD-10-CM | POA: Diagnosis not present

## 2020-11-23 DIAGNOSIS — F329 Major depressive disorder, single episode, unspecified: Secondary | ICD-10-CM | POA: Diagnosis not present

## 2020-11-23 DIAGNOSIS — Z85118 Personal history of other malignant neoplasm of bronchus and lung: Secondary | ICD-10-CM | POA: Diagnosis not present

## 2020-11-23 DIAGNOSIS — C3491 Malignant neoplasm of unspecified part of right bronchus or lung: Secondary | ICD-10-CM | POA: Diagnosis not present

## 2020-11-23 DIAGNOSIS — E782 Mixed hyperlipidemia: Secondary | ICD-10-CM | POA: Diagnosis not present

## 2020-11-23 DIAGNOSIS — I10 Essential (primary) hypertension: Secondary | ICD-10-CM | POA: Diagnosis not present

## 2020-11-23 DIAGNOSIS — N1831 Chronic kidney disease, stage 3a: Secondary | ICD-10-CM | POA: Diagnosis not present

## 2020-11-23 DIAGNOSIS — C3490 Malignant neoplasm of unspecified part of unspecified bronchus or lung: Secondary | ICD-10-CM | POA: Diagnosis not present

## 2020-11-23 DIAGNOSIS — E7800 Pure hypercholesterolemia, unspecified: Secondary | ICD-10-CM | POA: Diagnosis not present

## 2020-11-27 ENCOUNTER — Ambulatory Visit: Payer: Medicare Other | Admitting: Pulmonary Disease

## 2020-11-30 DIAGNOSIS — N1831 Chronic kidney disease, stage 3a: Secondary | ICD-10-CM | POA: Diagnosis not present

## 2020-11-30 DIAGNOSIS — M48061 Spinal stenosis, lumbar region without neurogenic claudication: Secondary | ICD-10-CM | POA: Diagnosis not present

## 2020-11-30 DIAGNOSIS — I700 Atherosclerosis of aorta: Secondary | ICD-10-CM | POA: Diagnosis not present

## 2020-11-30 DIAGNOSIS — K219 Gastro-esophageal reflux disease without esophagitis: Secondary | ICD-10-CM | POA: Diagnosis not present

## 2020-11-30 DIAGNOSIS — R269 Unspecified abnormalities of gait and mobility: Secondary | ICD-10-CM | POA: Diagnosis not present

## 2020-11-30 DIAGNOSIS — I10 Essential (primary) hypertension: Secondary | ICD-10-CM | POA: Diagnosis not present

## 2020-11-30 DIAGNOSIS — F419 Anxiety disorder, unspecified: Secondary | ICD-10-CM | POA: Diagnosis not present

## 2020-11-30 DIAGNOSIS — Z85118 Personal history of other malignant neoplasm of bronchus and lung: Secondary | ICD-10-CM | POA: Diagnosis not present

## 2020-11-30 DIAGNOSIS — M519 Unspecified thoracic, thoracolumbar and lumbosacral intervertebral disc disorder: Secondary | ICD-10-CM | POA: Diagnosis not present

## 2020-11-30 DIAGNOSIS — Z23 Encounter for immunization: Secondary | ICD-10-CM | POA: Diagnosis not present

## 2020-11-30 DIAGNOSIS — J449 Chronic obstructive pulmonary disease, unspecified: Secondary | ICD-10-CM | POA: Diagnosis not present

## 2021-01-15 ENCOUNTER — Encounter (HOSPITAL_BASED_OUTPATIENT_CLINIC_OR_DEPARTMENT_OTHER): Payer: Medicare Other | Attending: Internal Medicine | Admitting: Internal Medicine

## 2021-01-16 DIAGNOSIS — E7800 Pure hypercholesterolemia, unspecified: Secondary | ICD-10-CM | POA: Diagnosis not present

## 2021-01-16 DIAGNOSIS — E782 Mixed hyperlipidemia: Secondary | ICD-10-CM | POA: Diagnosis not present

## 2021-01-16 DIAGNOSIS — I10 Essential (primary) hypertension: Secondary | ICD-10-CM | POA: Diagnosis not present

## 2021-01-16 DIAGNOSIS — J449 Chronic obstructive pulmonary disease, unspecified: Secondary | ICD-10-CM | POA: Diagnosis not present

## 2021-01-16 DIAGNOSIS — N1831 Chronic kidney disease, stage 3a: Secondary | ICD-10-CM | POA: Diagnosis not present

## 2021-01-16 DIAGNOSIS — G4700 Insomnia, unspecified: Secondary | ICD-10-CM | POA: Diagnosis not present

## 2021-01-16 DIAGNOSIS — K219 Gastro-esophageal reflux disease without esophagitis: Secondary | ICD-10-CM | POA: Diagnosis not present

## 2021-01-16 DIAGNOSIS — F329 Major depressive disorder, single episode, unspecified: Secondary | ICD-10-CM | POA: Diagnosis not present

## 2021-02-28 DIAGNOSIS — E7800 Pure hypercholesterolemia, unspecified: Secondary | ICD-10-CM | POA: Diagnosis not present

## 2021-02-28 DIAGNOSIS — I10 Essential (primary) hypertension: Secondary | ICD-10-CM | POA: Diagnosis not present

## 2021-02-28 DIAGNOSIS — F322 Major depressive disorder, single episode, severe without psychotic features: Secondary | ICD-10-CM | POA: Diagnosis not present

## 2021-02-28 DIAGNOSIS — N183 Chronic kidney disease, stage 3 unspecified: Secondary | ICD-10-CM | POA: Diagnosis not present

## 2021-02-28 DIAGNOSIS — K219 Gastro-esophageal reflux disease without esophagitis: Secondary | ICD-10-CM | POA: Diagnosis not present

## 2021-02-28 DIAGNOSIS — J449 Chronic obstructive pulmonary disease, unspecified: Secondary | ICD-10-CM | POA: Diagnosis not present

## 2021-02-28 DIAGNOSIS — C3491 Malignant neoplasm of unspecified part of right bronchus or lung: Secondary | ICD-10-CM | POA: Diagnosis not present

## 2021-02-28 DIAGNOSIS — M1990 Unspecified osteoarthritis, unspecified site: Secondary | ICD-10-CM | POA: Diagnosis not present

## 2021-02-28 DIAGNOSIS — M810 Age-related osteoporosis without current pathological fracture: Secondary | ICD-10-CM | POA: Diagnosis not present

## 2021-02-28 DIAGNOSIS — G4700 Insomnia, unspecified: Secondary | ICD-10-CM | POA: Diagnosis not present

## 2021-02-28 DIAGNOSIS — E782 Mixed hyperlipidemia: Secondary | ICD-10-CM | POA: Diagnosis not present

## 2021-02-28 DIAGNOSIS — Z85118 Personal history of other malignant neoplasm of bronchus and lung: Secondary | ICD-10-CM | POA: Diagnosis not present

## 2021-04-06 DIAGNOSIS — E7800 Pure hypercholesterolemia, unspecified: Secondary | ICD-10-CM | POA: Diagnosis not present

## 2021-04-06 DIAGNOSIS — M1990 Unspecified osteoarthritis, unspecified site: Secondary | ICD-10-CM | POA: Diagnosis not present

## 2021-04-06 DIAGNOSIS — J449 Chronic obstructive pulmonary disease, unspecified: Secondary | ICD-10-CM | POA: Diagnosis not present

## 2021-04-06 DIAGNOSIS — K219 Gastro-esophageal reflux disease without esophagitis: Secondary | ICD-10-CM | POA: Diagnosis not present

## 2021-04-06 DIAGNOSIS — I10 Essential (primary) hypertension: Secondary | ICD-10-CM | POA: Diagnosis not present

## 2021-04-06 DIAGNOSIS — G4700 Insomnia, unspecified: Secondary | ICD-10-CM | POA: Diagnosis not present

## 2021-04-06 DIAGNOSIS — F329 Major depressive disorder, single episode, unspecified: Secondary | ICD-10-CM | POA: Diagnosis not present

## 2021-04-06 DIAGNOSIS — N1831 Chronic kidney disease, stage 3a: Secondary | ICD-10-CM | POA: Diagnosis not present

## 2021-04-06 DIAGNOSIS — M810 Age-related osteoporosis without current pathological fracture: Secondary | ICD-10-CM | POA: Diagnosis not present

## 2021-04-06 DIAGNOSIS — E782 Mixed hyperlipidemia: Secondary | ICD-10-CM | POA: Diagnosis not present

## 2021-07-04 DIAGNOSIS — M1990 Unspecified osteoarthritis, unspecified site: Secondary | ICD-10-CM | POA: Diagnosis not present

## 2021-07-04 DIAGNOSIS — F322 Major depressive disorder, single episode, severe without psychotic features: Secondary | ICD-10-CM | POA: Diagnosis not present

## 2021-07-04 DIAGNOSIS — E782 Mixed hyperlipidemia: Secondary | ICD-10-CM | POA: Diagnosis not present

## 2021-07-04 DIAGNOSIS — K219 Gastro-esophageal reflux disease without esophagitis: Secondary | ICD-10-CM | POA: Diagnosis not present

## 2021-07-04 DIAGNOSIS — C3491 Malignant neoplasm of unspecified part of right bronchus or lung: Secondary | ICD-10-CM | POA: Diagnosis not present

## 2021-07-04 DIAGNOSIS — J449 Chronic obstructive pulmonary disease, unspecified: Secondary | ICD-10-CM | POA: Diagnosis not present

## 2021-07-04 DIAGNOSIS — G4700 Insomnia, unspecified: Secondary | ICD-10-CM | POA: Diagnosis not present

## 2021-07-04 DIAGNOSIS — N183 Chronic kidney disease, stage 3 unspecified: Secondary | ICD-10-CM | POA: Diagnosis not present

## 2021-07-04 DIAGNOSIS — C3490 Malignant neoplasm of unspecified part of unspecified bronchus or lung: Secondary | ICD-10-CM | POA: Diagnosis not present

## 2021-07-04 DIAGNOSIS — E7800 Pure hypercholesterolemia, unspecified: Secondary | ICD-10-CM | POA: Diagnosis not present

## 2021-07-04 DIAGNOSIS — I10 Essential (primary) hypertension: Secondary | ICD-10-CM | POA: Diagnosis not present

## 2021-07-04 DIAGNOSIS — M810 Age-related osteoporosis without current pathological fracture: Secondary | ICD-10-CM | POA: Diagnosis not present

## 2021-08-07 DIAGNOSIS — Z23 Encounter for immunization: Secondary | ICD-10-CM | POA: Diagnosis not present

## 2021-12-18 DIAGNOSIS — G4700 Insomnia, unspecified: Secondary | ICD-10-CM | POA: Diagnosis not present

## 2021-12-18 DIAGNOSIS — M1990 Unspecified osteoarthritis, unspecified site: Secondary | ICD-10-CM | POA: Diagnosis not present

## 2021-12-18 DIAGNOSIS — Z85118 Personal history of other malignant neoplasm of bronchus and lung: Secondary | ICD-10-CM | POA: Diagnosis not present

## 2021-12-18 DIAGNOSIS — N1831 Chronic kidney disease, stage 3a: Secondary | ICD-10-CM | POA: Diagnosis not present

## 2021-12-18 DIAGNOSIS — J449 Chronic obstructive pulmonary disease, unspecified: Secondary | ICD-10-CM | POA: Diagnosis not present

## 2021-12-18 DIAGNOSIS — E559 Vitamin D deficiency, unspecified: Secondary | ICD-10-CM | POA: Diagnosis not present

## 2021-12-18 DIAGNOSIS — E782 Mixed hyperlipidemia: Secondary | ICD-10-CM | POA: Diagnosis not present

## 2021-12-18 DIAGNOSIS — I10 Essential (primary) hypertension: Secondary | ICD-10-CM | POA: Diagnosis not present

## 2021-12-18 DIAGNOSIS — M810 Age-related osteoporosis without current pathological fracture: Secondary | ICD-10-CM | POA: Diagnosis not present

## 2021-12-18 DIAGNOSIS — M48061 Spinal stenosis, lumbar region without neurogenic claudication: Secondary | ICD-10-CM | POA: Diagnosis not present

## 2021-12-18 DIAGNOSIS — I700 Atherosclerosis of aorta: Secondary | ICD-10-CM | POA: Diagnosis not present

## 2021-12-18 DIAGNOSIS — Z0000 Encounter for general adult medical examination without abnormal findings: Secondary | ICD-10-CM | POA: Diagnosis not present

## 2022-05-03 DIAGNOSIS — M810 Age-related osteoporosis without current pathological fracture: Secondary | ICD-10-CM | POA: Diagnosis not present

## 2022-05-03 DIAGNOSIS — I10 Essential (primary) hypertension: Secondary | ICD-10-CM | POA: Diagnosis not present

## 2022-05-03 DIAGNOSIS — M1990 Unspecified osteoarthritis, unspecified site: Secondary | ICD-10-CM | POA: Diagnosis not present

## 2022-05-03 DIAGNOSIS — N1831 Chronic kidney disease, stage 3a: Secondary | ICD-10-CM | POA: Diagnosis not present

## 2022-05-03 DIAGNOSIS — E782 Mixed hyperlipidemia: Secondary | ICD-10-CM | POA: Diagnosis not present

## 2022-05-03 DIAGNOSIS — J449 Chronic obstructive pulmonary disease, unspecified: Secondary | ICD-10-CM | POA: Diagnosis not present

## 2022-05-03 DIAGNOSIS — G4700 Insomnia, unspecified: Secondary | ICD-10-CM | POA: Diagnosis not present

## 2022-05-03 DIAGNOSIS — F331 Major depressive disorder, recurrent, moderate: Secondary | ICD-10-CM | POA: Diagnosis not present

## 2022-05-03 DIAGNOSIS — K219 Gastro-esophageal reflux disease without esophagitis: Secondary | ICD-10-CM | POA: Diagnosis not present

## 2022-06-17 DIAGNOSIS — Z85118 Personal history of other malignant neoplasm of bronchus and lung: Secondary | ICD-10-CM | POA: Diagnosis not present

## 2022-06-17 DIAGNOSIS — F331 Major depressive disorder, recurrent, moderate: Secondary | ICD-10-CM | POA: Diagnosis not present

## 2022-06-17 DIAGNOSIS — N1831 Chronic kidney disease, stage 3a: Secondary | ICD-10-CM | POA: Diagnosis not present

## 2022-06-17 DIAGNOSIS — L600 Ingrowing nail: Secondary | ICD-10-CM | POA: Diagnosis not present

## 2022-06-17 DIAGNOSIS — I10 Essential (primary) hypertension: Secondary | ICD-10-CM | POA: Diagnosis not present

## 2022-06-17 DIAGNOSIS — E782 Mixed hyperlipidemia: Secondary | ICD-10-CM | POA: Diagnosis not present

## 2022-06-17 DIAGNOSIS — I700 Atherosclerosis of aorta: Secondary | ICD-10-CM | POA: Diagnosis not present

## 2022-06-17 DIAGNOSIS — J449 Chronic obstructive pulmonary disease, unspecified: Secondary | ICD-10-CM | POA: Diagnosis not present

## 2022-06-18 DIAGNOSIS — I10 Essential (primary) hypertension: Secondary | ICD-10-CM | POA: Diagnosis not present

## 2022-06-18 DIAGNOSIS — J449 Chronic obstructive pulmonary disease, unspecified: Secondary | ICD-10-CM | POA: Diagnosis not present

## 2022-06-18 DIAGNOSIS — K219 Gastro-esophageal reflux disease without esophagitis: Secondary | ICD-10-CM | POA: Diagnosis not present

## 2022-06-18 DIAGNOSIS — N1831 Chronic kidney disease, stage 3a: Secondary | ICD-10-CM | POA: Diagnosis not present

## 2022-06-18 DIAGNOSIS — E782 Mixed hyperlipidemia: Secondary | ICD-10-CM | POA: Diagnosis not present

## 2022-06-18 DIAGNOSIS — E7800 Pure hypercholesterolemia, unspecified: Secondary | ICD-10-CM | POA: Diagnosis not present

## 2022-06-18 DIAGNOSIS — M810 Age-related osteoporosis without current pathological fracture: Secondary | ICD-10-CM | POA: Diagnosis not present

## 2022-06-18 DIAGNOSIS — M1990 Unspecified osteoarthritis, unspecified site: Secondary | ICD-10-CM | POA: Diagnosis not present

## 2022-06-18 DIAGNOSIS — G4700 Insomnia, unspecified: Secondary | ICD-10-CM | POA: Diagnosis not present

## 2022-06-18 DIAGNOSIS — F331 Major depressive disorder, recurrent, moderate: Secondary | ICD-10-CM | POA: Diagnosis not present

## 2022-07-01 ENCOUNTER — Ambulatory Visit: Payer: Medicare Other | Admitting: Podiatry

## 2022-07-15 ENCOUNTER — Ambulatory Visit (INDEPENDENT_AMBULATORY_CARE_PROVIDER_SITE_OTHER): Payer: Medicare Other | Admitting: Podiatry

## 2022-07-15 ENCOUNTER — Encounter: Payer: Self-pay | Admitting: Podiatry

## 2022-07-15 DIAGNOSIS — M79674 Pain in right toe(s): Secondary | ICD-10-CM

## 2022-07-15 DIAGNOSIS — M79675 Pain in left toe(s): Secondary | ICD-10-CM | POA: Diagnosis not present

## 2022-07-15 DIAGNOSIS — B351 Tinea unguium: Secondary | ICD-10-CM

## 2022-07-15 NOTE — Progress Notes (Signed)
  Subjective:  Patient ID: [name] [name], female    DOB: 1927-08-04,   MRN: [id number]  Chief Complaint  Patient presents with   Nail Problem    Patient states that her toenails are giving her some trouble. Bil great toes throb. Toenails are thick.    88 y.o. female presents for concern of thickened elongated and painful nails that are difficult to trim. Requesting to have them trimmed today.  She is not diabetic or on any blood thinners.   PCP:  Wenda Low, MD    . Denies any other pedal complaints. Denies n/v/f/c.   Past Medical History:  Diagnosis Date   Allergic rhinitis, cause unspecified    Angioneurotic edema not elsewhere classified    Arthritis    Cancer ([place]) 2006   lung ca   Chronic obstructive asthma, unspecified    Collapsed lung 1959   hx of with chest tube insertion   Complication of anesthesia    severe headache x1   Depression    Environmental allergies    Headache(784.0)    Malignant neoplasm of bronchus and lung, unspecified site    UTI (urinary tract infection)     Objective:  Physical Exam: Vascular: DP/PT pulses 2/4 bilateral. CFT <3 seconds. Absent hair growth on digits. Edema noted to bilateral lower extremities. Xerosis noted bilaterally.  Skin. No lacerations or abrasions bilateral feet. Nails 1-5 bilateral  are thickened discolored and elongated with subungual debris.  Musculoskeletal: MMT 5/5 bilateral lower extremities in DF, PF, Inversion and Eversion. Deceased ROM in DF of ankle joint.  Neurological: Sensation intact to light touch. Protective sensation  intact bilateral.    Assessment:   1. Pain due to onychomycosis of toenails of both feet      Plan:  Patient was evaluated and treated and all questions answered. -Discussed and educated patient on foot care, especially with  regards to the vascular, neurological and musculoskeletal systems.  -Discussed supportive shoes at all times and checking feet regularly.  -Mechanically  debrided all nails 1-5 bilateral using sterile nail nipper and filed with dremel without incident  -Answered all patient questions -Patient to return  in 3 months for at risk foot care -Patient advised to call the office if any problems or questions arise in the meantime.   Lorenda Peck, DPM

## 2022-08-02 DIAGNOSIS — M1990 Unspecified osteoarthritis, unspecified site: Secondary | ICD-10-CM | POA: Diagnosis not present

## 2022-08-02 DIAGNOSIS — G4700 Insomnia, unspecified: Secondary | ICD-10-CM | POA: Diagnosis not present

## 2022-08-02 DIAGNOSIS — E782 Mixed hyperlipidemia: Secondary | ICD-10-CM | POA: Diagnosis not present

## 2022-08-02 DIAGNOSIS — K219 Gastro-esophageal reflux disease without esophagitis: Secondary | ICD-10-CM | POA: Diagnosis not present

## 2022-08-02 DIAGNOSIS — J449 Chronic obstructive pulmonary disease, unspecified: Secondary | ICD-10-CM | POA: Diagnosis not present

## 2022-08-02 DIAGNOSIS — N1831 Chronic kidney disease, stage 3a: Secondary | ICD-10-CM | POA: Diagnosis not present

## 2022-08-02 DIAGNOSIS — F331 Major depressive disorder, recurrent, moderate: Secondary | ICD-10-CM | POA: Diagnosis not present

## 2022-08-02 DIAGNOSIS — M810 Age-related osteoporosis without current pathological fracture: Secondary | ICD-10-CM | POA: Diagnosis not present

## 2022-08-02 DIAGNOSIS — I10 Essential (primary) hypertension: Secondary | ICD-10-CM | POA: Diagnosis not present

## 2022-10-14 ENCOUNTER — Ambulatory Visit: Payer: Medicare Other | Admitting: Podiatry

## 2022-12-23 DIAGNOSIS — M810 Age-related osteoporosis without current pathological fracture: Secondary | ICD-10-CM | POA: Diagnosis not present

## 2022-12-23 DIAGNOSIS — E782 Mixed hyperlipidemia: Secondary | ICD-10-CM | POA: Diagnosis not present

## 2022-12-23 DIAGNOSIS — I700 Atherosclerosis of aorta: Secondary | ICD-10-CM | POA: Diagnosis not present

## 2022-12-23 DIAGNOSIS — F331 Major depressive disorder, recurrent, moderate: Secondary | ICD-10-CM | POA: Diagnosis not present

## 2022-12-23 DIAGNOSIS — Z85118 Personal history of other malignant neoplasm of bronchus and lung: Secondary | ICD-10-CM | POA: Diagnosis not present

## 2022-12-23 DIAGNOSIS — M519 Unspecified thoracic, thoracolumbar and lumbosacral intervertebral disc disorder: Secondary | ICD-10-CM | POA: Diagnosis not present

## 2022-12-23 DIAGNOSIS — N1831 Chronic kidney disease, stage 3a: Secondary | ICD-10-CM | POA: Diagnosis not present

## 2022-12-23 DIAGNOSIS — I10 Essential (primary) hypertension: Secondary | ICD-10-CM | POA: Diagnosis not present

## 2022-12-23 DIAGNOSIS — J449 Chronic obstructive pulmonary disease, unspecified: Secondary | ICD-10-CM | POA: Diagnosis not present

## 2023-01-21 ENCOUNTER — Ambulatory Visit (INDEPENDENT_AMBULATORY_CARE_PROVIDER_SITE_OTHER): Payer: Medicare Other | Admitting: Podiatry

## 2023-01-21 DIAGNOSIS — M79675 Pain in left toe(s): Secondary | ICD-10-CM

## 2023-01-21 DIAGNOSIS — B351 Tinea unguium: Secondary | ICD-10-CM | POA: Diagnosis not present

## 2023-01-21 DIAGNOSIS — M79674 Pain in right toe(s): Secondary | ICD-10-CM

## 2023-01-21 NOTE — Progress Notes (Unsigned)
  Subjective:  Patient ID: [name] [name], female    DOB: 1927-11-04,  MRN: [id number]  [name] [name] presents to clinic today for {jgcomplaint:23593}  Chief Complaint  Patient presents with   Nail Problem    [place] PCP-[facility] PCP VST-12/2022   New problem(s): None. {jgcomplaint:23593}  PCP is Wenda Low, MD.  Allergies  Allergen Reactions   Aspirin Other (See Comments)      GI upset in large amounts   Codeine Sulfate Other (See Comments)     GI upset, headache   Penicillins Rash   Septra Ds [Sulfamethoxazole-Trimethoprim] Rash    Review of Systems: Negative except as noted in the HPI.  Objective: No changes noted in today's physical examination. There were no vitals filed for this visit. [name] [name] is a pleasant 89 y.o. female {jgbodyhabitus:24098} AAO x 3.   Assessment/Plan: 1. Pain due to onychomycosis of toenails of both feet     No orders of the defined types were placed in this encounter.   None {Jgplan:23602::"-Patient/POA to call should there be question/concern in the interim."}   No follow-ups on file.  Marzetta Board, DPM

## 2023-01-23 ENCOUNTER — Encounter: Payer: Self-pay | Admitting: Podiatry

## 2023-03-04 DIAGNOSIS — I10 Essential (primary) hypertension: Secondary | ICD-10-CM | POA: Diagnosis not present

## 2023-03-04 DIAGNOSIS — E782 Mixed hyperlipidemia: Secondary | ICD-10-CM | POA: Diagnosis not present

## 2023-03-04 DIAGNOSIS — N183 Chronic kidney disease, stage 3 unspecified: Secondary | ICD-10-CM | POA: Diagnosis not present

## 2023-03-04 DIAGNOSIS — J449 Chronic obstructive pulmonary disease, unspecified: Secondary | ICD-10-CM | POA: Diagnosis not present

## 2023-04-28 DIAGNOSIS — J449 Chronic obstructive pulmonary disease, unspecified: Secondary | ICD-10-CM | POA: Diagnosis not present

## 2023-04-28 DIAGNOSIS — Z23 Encounter for immunization: Secondary | ICD-10-CM | POA: Diagnosis not present

## 2023-04-28 DIAGNOSIS — Z0000 Encounter for general adult medical examination without abnormal findings: Secondary | ICD-10-CM | POA: Diagnosis not present

## 2023-04-28 DIAGNOSIS — I872 Venous insufficiency (chronic) (peripheral): Secondary | ICD-10-CM | POA: Diagnosis not present

## 2023-04-28 DIAGNOSIS — Z85118 Personal history of other malignant neoplasm of bronchus and lung: Secondary | ICD-10-CM | POA: Diagnosis not present

## 2023-04-28 DIAGNOSIS — M810 Age-related osteoporosis without current pathological fracture: Secondary | ICD-10-CM | POA: Diagnosis not present

## 2023-04-28 DIAGNOSIS — N1831 Chronic kidney disease, stage 3a: Secondary | ICD-10-CM | POA: Diagnosis not present

## 2023-04-28 DIAGNOSIS — E782 Mixed hyperlipidemia: Secondary | ICD-10-CM | POA: Diagnosis not present

## 2023-04-28 DIAGNOSIS — F331 Major depressive disorder, recurrent, moderate: Secondary | ICD-10-CM | POA: Diagnosis not present

## 2023-04-28 DIAGNOSIS — I10 Essential (primary) hypertension: Secondary | ICD-10-CM | POA: Diagnosis not present

## 2023-04-28 DIAGNOSIS — I700 Atherosclerosis of aorta: Secondary | ICD-10-CM | POA: Diagnosis not present

## 2023-04-28 DIAGNOSIS — M48061 Spinal stenosis, lumbar region without neurogenic claudication: Secondary | ICD-10-CM | POA: Diagnosis not present

## 2023-06-03 ENCOUNTER — Ambulatory Visit (INDEPENDENT_AMBULATORY_CARE_PROVIDER_SITE_OTHER): Payer: Medicare Other | Admitting: Podiatry

## 2023-06-03 ENCOUNTER — Encounter: Payer: Self-pay | Admitting: Podiatry

## 2023-06-03 DIAGNOSIS — M79675 Pain in left toe(s): Secondary | ICD-10-CM

## 2023-06-03 DIAGNOSIS — B351 Tinea unguium: Secondary | ICD-10-CM

## 2023-06-03 DIAGNOSIS — M79674 Pain in right toe(s): Secondary | ICD-10-CM

## 2023-06-03 NOTE — Progress Notes (Signed)
  Subjective:  Patient ID: [name] [name], female    DOB: 08-31-1927,  MRN: [id number]  [name] [name] presents to clinic today for: painful elongated mycotic toenails 1-5 bilaterally which are tender when wearing enclosed shoe gear. Pain is relieved with periodic professional debridement.  Chief Complaint  Patient presents with   Nail Problem    RFC,Referring Provider Georgann Housekeeper, MD,lov:07/24       PCP is Georgann Housekeeper, MD.  Allergies  Allergen Reactions   Aspirin Other (See Comments)      GI upset in large amounts   Codeine Sulfate Other (See Comments)     GI upset, headache   Penicillins Rash   Septra Ds [Sulfamethoxazole-Trimethoprim] Rash    Review of Systems: Negative except as noted in the HPI.  Objective: No changes noted in today's physical examination. There were no vitals filed for this visit.  [name] [name] is a pleasant 89 y.o. female in NAD. AAO x 3.  Vascular Examination: Capillary refill time <3 seconds b/l LE. Palpable pedal pulses b/l LE. Digital hair absent b/l. No pedal edema b/l. Skin temperature gradient WNL b/l. No varicosities b/l. No cyanosis or clubbing noted b/l LE.Marland Kitchen  Dermatological Examination: Pedal skin with normal turgor, texture and tone b/l. No open wounds. No interdigital macerations b/l. Toenails 1-5 b/l thickened, discolored, dystrophic with subungual debris. There is pain on palpation to dorsal aspect of nailplates. No corns, calluses nor porokeratotic lesions noted..  Neurological Examination: Protective sensation intact with 10 gram monofilament b/l LE. Vibratory sensation intact b/l LE.   Musculoskeletal Examination: Muscle strength 5/5 to all lower extremity muscle groups bilaterally. No pain, crepitus or joint limitation noted with ROM bilateral LE.  Assessment/Plan: 1. Pain due to onychomycosis of toenails of both feet     Patient was evaluated and treated. All patient's and/or POA's questions/concerns addressed on  today's visit. Mycotic toenails 1-5 debrided in length and girth without incident. Continue soft, supportive shoe gear daily. Report any pedal injuries to medical professional. Call office if there are any quesitons/concerns. -Patient/POA to call should there be question/concern in the interim.   Return in about 3 months (around 09/03/2023).  Freddie Breech, DPM

## 2023-09-09 ENCOUNTER — Ambulatory Visit (INDEPENDENT_AMBULATORY_CARE_PROVIDER_SITE_OTHER): Payer: Medicare Other | Admitting: Podiatry

## 2023-09-09 ENCOUNTER — Encounter: Payer: Self-pay | Admitting: Podiatry

## 2023-09-09 DIAGNOSIS — M79675 Pain in left toe(s): Secondary | ICD-10-CM

## 2023-09-09 DIAGNOSIS — M79674 Pain in right toe(s): Secondary | ICD-10-CM | POA: Diagnosis not present

## 2023-09-09 DIAGNOSIS — B351 Tinea unguium: Secondary | ICD-10-CM

## 2023-09-12 NOTE — Progress Notes (Signed)
  Subjective:  Patient ID: [name] [name], female    DOB: 10-Mar-1927,  MRN: [id number]  89 y.o. female presents to clinic with  painful thick toenails that are difficult to trim. Pain interferes with ambulation. Aggravating factors include wearing enclosed shoe gear. Pain is relieved with periodic professional debridement.  Chief Complaint  Patient presents with   RFC    RFC- Patient states her toe nails are growing under.    New problem(s): None   PCP is Georgann Housekeeper, MD.  Allergies  Allergen Reactions   Aspirin Other (See Comments)      GI upset in large amounts   Codeine Sulfate Other (See Comments)     GI upset, headache   Penicillins Rash   Septra Ds [Sulfamethoxazole-Trimethoprim] Rash    Review of Systems: Negative except as noted in the HPI.   Objective:  [name] [name] is a pleasant 89 y.o. female WD, WN in NAD. AAO x 3.  Vascular Examination: Vascular status intact b/l with palpable pedal pulses. CFT immediate b/l. No edema. No pain with calf compression b/l. Skin temperature gradient WNL b/l. Pedal hair absent.  Neurological Examination: Sensation grossly intact b/l with 10 gram monofilament. Vibratory sensation intact b/l.   Dermatological Examination: Pedal skin with normal turgor, texture and tone b/l. Toenails 1-5 b/l thick, discolored, elongated with subungual debris and pain on dorsal palpation. No hyperkeratotic lesions noted b/l.   Musculoskeletal Examination: Muscle strength 5/5 to b/l LE. No pain, crepitus or joint limitation noted with ROM bilateral LE. No gross bony deformities bilaterally.  Radiographs: None  Last A1c:       No data to display           Assessment:   1. Pain due to onychomycosis of toenails of both feet     Plan:  -Consent given for treatment as described below: -Examined patient. -Continue supportive shoe gear daily. -Toenails 1-5 b/l were debrided in length and girth with sterile nail nippers and dremel without  iatrogenic bleeding.  -Patient/POA to call should there be question/concern in the interim.  Return in about 3 months (around 12/10/2023).  Freddie Breech, DPM

## 2023-10-22 DIAGNOSIS — I700 Atherosclerosis of aorta: Secondary | ICD-10-CM | POA: Diagnosis not present

## 2023-10-22 DIAGNOSIS — Z85118 Personal history of other malignant neoplasm of bronchus and lung: Secondary | ICD-10-CM | POA: Diagnosis not present

## 2023-10-22 DIAGNOSIS — N1831 Chronic kidney disease, stage 3a: Secondary | ICD-10-CM | POA: Diagnosis not present

## 2023-10-22 DIAGNOSIS — J449 Chronic obstructive pulmonary disease, unspecified: Secondary | ICD-10-CM | POA: Diagnosis not present

## 2023-10-22 DIAGNOSIS — I83019 Varicose veins of right lower extremity with ulcer of unspecified site: Secondary | ICD-10-CM | POA: Diagnosis not present

## 2023-10-22 DIAGNOSIS — F419 Anxiety disorder, unspecified: Secondary | ICD-10-CM | POA: Diagnosis not present

## 2023-10-22 DIAGNOSIS — M48061 Spinal stenosis, lumbar region without neurogenic claudication: Secondary | ICD-10-CM | POA: Diagnosis not present

## 2023-10-22 DIAGNOSIS — R54 Age-related physical debility: Secondary | ICD-10-CM | POA: Diagnosis not present

## 2023-10-22 DIAGNOSIS — F331 Major depressive disorder, recurrent, moderate: Secondary | ICD-10-CM | POA: Diagnosis not present

## 2023-10-22 DIAGNOSIS — Z23 Encounter for immunization: Secondary | ICD-10-CM | POA: Diagnosis not present

## 2023-10-22 DIAGNOSIS — I872 Venous insufficiency (chronic) (peripheral): Secondary | ICD-10-CM | POA: Diagnosis not present

## 2023-10-22 DIAGNOSIS — I10 Essential (primary) hypertension: Secondary | ICD-10-CM | POA: Diagnosis not present

## 2023-10-23 DIAGNOSIS — Z85118 Personal history of other malignant neoplasm of bronchus and lung: Secondary | ICD-10-CM | POA: Diagnosis not present

## 2023-10-23 DIAGNOSIS — J309 Allergic rhinitis, unspecified: Secondary | ICD-10-CM | POA: Diagnosis not present

## 2023-10-23 DIAGNOSIS — Z7951 Long term (current) use of inhaled steroids: Secondary | ICD-10-CM | POA: Diagnosis not present

## 2023-10-23 DIAGNOSIS — I700 Atherosclerosis of aorta: Secondary | ICD-10-CM | POA: Diagnosis not present

## 2023-10-23 DIAGNOSIS — I129 Hypertensive chronic kidney disease with stage 1 through stage 4 chronic kidney disease, or unspecified chronic kidney disease: Secondary | ICD-10-CM | POA: Diagnosis not present

## 2023-10-23 DIAGNOSIS — G4700 Insomnia, unspecified: Secondary | ICD-10-CM | POA: Diagnosis not present

## 2023-10-23 DIAGNOSIS — M48061 Spinal stenosis, lumbar region without neurogenic claudication: Secondary | ICD-10-CM | POA: Diagnosis not present

## 2023-10-23 DIAGNOSIS — M810 Age-related osteoporosis without current pathological fracture: Secondary | ICD-10-CM | POA: Diagnosis not present

## 2023-10-23 DIAGNOSIS — F419 Anxiety disorder, unspecified: Secondary | ICD-10-CM | POA: Diagnosis not present

## 2023-10-23 DIAGNOSIS — E559 Vitamin D deficiency, unspecified: Secondary | ICD-10-CM | POA: Diagnosis not present

## 2023-10-23 DIAGNOSIS — I872 Venous insufficiency (chronic) (peripheral): Secondary | ICD-10-CM | POA: Diagnosis not present

## 2023-10-23 DIAGNOSIS — J4489 Other specified chronic obstructive pulmonary disease: Secondary | ICD-10-CM | POA: Diagnosis not present

## 2023-10-23 DIAGNOSIS — Z96641 Presence of right artificial hip joint: Secondary | ICD-10-CM | POA: Diagnosis not present

## 2023-10-23 DIAGNOSIS — L97828 Non-pressure chronic ulcer of other part of left lower leg with other specified severity: Secondary | ICD-10-CM | POA: Diagnosis not present

## 2023-10-23 DIAGNOSIS — F331 Major depressive disorder, recurrent, moderate: Secondary | ICD-10-CM | POA: Diagnosis not present

## 2023-10-23 DIAGNOSIS — K219 Gastro-esophageal reflux disease without esophagitis: Secondary | ICD-10-CM | POA: Diagnosis not present

## 2023-10-23 DIAGNOSIS — M51369 Other intervertebral disc degeneration, lumbar region without mention of lumbar back pain or lower extremity pain: Secondary | ICD-10-CM | POA: Diagnosis not present

## 2023-10-23 DIAGNOSIS — L97811 Non-pressure chronic ulcer of other part of right lower leg limited to breakdown of skin: Secondary | ICD-10-CM | POA: Diagnosis not present

## 2023-10-23 DIAGNOSIS — N1831 Chronic kidney disease, stage 3a: Secondary | ICD-10-CM | POA: Diagnosis not present

## 2023-10-23 DIAGNOSIS — M47812 Spondylosis without myelopathy or radiculopathy, cervical region: Secondary | ICD-10-CM | POA: Diagnosis not present

## 2023-10-23 DIAGNOSIS — E782 Mixed hyperlipidemia: Secondary | ICD-10-CM | POA: Diagnosis not present

## 2023-10-23 DIAGNOSIS — M5090 Cervical disc disorder, unspecified, unspecified cervical region: Secondary | ICD-10-CM | POA: Diagnosis not present

## 2023-10-28 DIAGNOSIS — L97811 Non-pressure chronic ulcer of other part of right lower leg limited to breakdown of skin: Secondary | ICD-10-CM | POA: Diagnosis not present

## 2023-10-28 DIAGNOSIS — L97828 Non-pressure chronic ulcer of other part of left lower leg with other specified severity: Secondary | ICD-10-CM | POA: Diagnosis not present

## 2023-10-28 DIAGNOSIS — I129 Hypertensive chronic kidney disease with stage 1 through stage 4 chronic kidney disease, or unspecified chronic kidney disease: Secondary | ICD-10-CM | POA: Diagnosis not present

## 2023-10-28 DIAGNOSIS — N1831 Chronic kidney disease, stage 3a: Secondary | ICD-10-CM | POA: Diagnosis not present

## 2023-10-28 DIAGNOSIS — J4489 Other specified chronic obstructive pulmonary disease: Secondary | ICD-10-CM | POA: Diagnosis not present

## 2023-10-28 DIAGNOSIS — I872 Venous insufficiency (chronic) (peripheral): Secondary | ICD-10-CM | POA: Diagnosis not present

## 2023-10-30 DIAGNOSIS — L97828 Non-pressure chronic ulcer of other part of left lower leg with other specified severity: Secondary | ICD-10-CM | POA: Diagnosis not present

## 2023-10-30 DIAGNOSIS — N1831 Chronic kidney disease, stage 3a: Secondary | ICD-10-CM | POA: Diagnosis not present

## 2023-10-30 DIAGNOSIS — L97811 Non-pressure chronic ulcer of other part of right lower leg limited to breakdown of skin: Secondary | ICD-10-CM | POA: Diagnosis not present

## 2023-10-30 DIAGNOSIS — I872 Venous insufficiency (chronic) (peripheral): Secondary | ICD-10-CM | POA: Diagnosis not present

## 2023-10-30 DIAGNOSIS — J4489 Other specified chronic obstructive pulmonary disease: Secondary | ICD-10-CM | POA: Diagnosis not present

## 2023-10-30 DIAGNOSIS — I129 Hypertensive chronic kidney disease with stage 1 through stage 4 chronic kidney disease, or unspecified chronic kidney disease: Secondary | ICD-10-CM | POA: Diagnosis not present

## 2023-11-04 DIAGNOSIS — N1831 Chronic kidney disease, stage 3a: Secondary | ICD-10-CM | POA: Diagnosis not present

## 2023-11-04 DIAGNOSIS — I872 Venous insufficiency (chronic) (peripheral): Secondary | ICD-10-CM | POA: Diagnosis not present

## 2023-11-04 DIAGNOSIS — L97828 Non-pressure chronic ulcer of other part of left lower leg with other specified severity: Secondary | ICD-10-CM | POA: Diagnosis not present

## 2023-11-04 DIAGNOSIS — L97811 Non-pressure chronic ulcer of other part of right lower leg limited to breakdown of skin: Secondary | ICD-10-CM | POA: Diagnosis not present

## 2023-11-04 DIAGNOSIS — I129 Hypertensive chronic kidney disease with stage 1 through stage 4 chronic kidney disease, or unspecified chronic kidney disease: Secondary | ICD-10-CM | POA: Diagnosis not present

## 2023-11-04 DIAGNOSIS — J4489 Other specified chronic obstructive pulmonary disease: Secondary | ICD-10-CM | POA: Diagnosis not present

## 2023-11-07 DIAGNOSIS — I129 Hypertensive chronic kidney disease with stage 1 through stage 4 chronic kidney disease, or unspecified chronic kidney disease: Secondary | ICD-10-CM | POA: Diagnosis not present

## 2023-11-07 DIAGNOSIS — L97811 Non-pressure chronic ulcer of other part of right lower leg limited to breakdown of skin: Secondary | ICD-10-CM | POA: Diagnosis not present

## 2023-11-07 DIAGNOSIS — I872 Venous insufficiency (chronic) (peripheral): Secondary | ICD-10-CM | POA: Diagnosis not present

## 2023-11-07 DIAGNOSIS — L97828 Non-pressure chronic ulcer of other part of left lower leg with other specified severity: Secondary | ICD-10-CM | POA: Diagnosis not present

## 2023-11-07 DIAGNOSIS — N1831 Chronic kidney disease, stage 3a: Secondary | ICD-10-CM | POA: Diagnosis not present

## 2023-11-07 DIAGNOSIS — J4489 Other specified chronic obstructive pulmonary disease: Secondary | ICD-10-CM | POA: Diagnosis not present

## 2023-11-11 DIAGNOSIS — L97828 Non-pressure chronic ulcer of other part of left lower leg with other specified severity: Secondary | ICD-10-CM | POA: Diagnosis not present

## 2023-11-11 DIAGNOSIS — N1831 Chronic kidney disease, stage 3a: Secondary | ICD-10-CM | POA: Diagnosis not present

## 2023-11-11 DIAGNOSIS — I872 Venous insufficiency (chronic) (peripheral): Secondary | ICD-10-CM | POA: Diagnosis not present

## 2023-11-11 DIAGNOSIS — J4489 Other specified chronic obstructive pulmonary disease: Secondary | ICD-10-CM | POA: Diagnosis not present

## 2023-11-11 DIAGNOSIS — L97811 Non-pressure chronic ulcer of other part of right lower leg limited to breakdown of skin: Secondary | ICD-10-CM | POA: Diagnosis not present

## 2023-11-11 DIAGNOSIS — I129 Hypertensive chronic kidney disease with stage 1 through stage 4 chronic kidney disease, or unspecified chronic kidney disease: Secondary | ICD-10-CM | POA: Diagnosis not present

## 2023-11-12 DIAGNOSIS — N1831 Chronic kidney disease, stage 3a: Secondary | ICD-10-CM | POA: Diagnosis not present

## 2023-11-12 DIAGNOSIS — J449 Chronic obstructive pulmonary disease, unspecified: Secondary | ICD-10-CM | POA: Diagnosis not present

## 2023-11-12 DIAGNOSIS — F331 Major depressive disorder, recurrent, moderate: Secondary | ICD-10-CM | POA: Diagnosis not present

## 2023-11-12 DIAGNOSIS — I872 Venous insufficiency (chronic) (peripheral): Secondary | ICD-10-CM | POA: Diagnosis not present

## 2023-11-14 DIAGNOSIS — L97828 Non-pressure chronic ulcer of other part of left lower leg with other specified severity: Secondary | ICD-10-CM | POA: Diagnosis not present

## 2023-11-14 DIAGNOSIS — J4489 Other specified chronic obstructive pulmonary disease: Secondary | ICD-10-CM | POA: Diagnosis not present

## 2023-11-14 DIAGNOSIS — L97811 Non-pressure chronic ulcer of other part of right lower leg limited to breakdown of skin: Secondary | ICD-10-CM | POA: Diagnosis not present

## 2023-11-14 DIAGNOSIS — I872 Venous insufficiency (chronic) (peripheral): Secondary | ICD-10-CM | POA: Diagnosis not present

## 2023-11-14 DIAGNOSIS — N1831 Chronic kidney disease, stage 3a: Secondary | ICD-10-CM | POA: Diagnosis not present

## 2023-11-14 DIAGNOSIS — I129 Hypertensive chronic kidney disease with stage 1 through stage 4 chronic kidney disease, or unspecified chronic kidney disease: Secondary | ICD-10-CM | POA: Diagnosis not present

## 2023-11-18 DIAGNOSIS — I872 Venous insufficiency (chronic) (peripheral): Secondary | ICD-10-CM | POA: Diagnosis not present

## 2023-11-18 DIAGNOSIS — L97811 Non-pressure chronic ulcer of other part of right lower leg limited to breakdown of skin: Secondary | ICD-10-CM | POA: Diagnosis not present

## 2023-11-18 DIAGNOSIS — J4489 Other specified chronic obstructive pulmonary disease: Secondary | ICD-10-CM | POA: Diagnosis not present

## 2023-11-18 DIAGNOSIS — I129 Hypertensive chronic kidney disease with stage 1 through stage 4 chronic kidney disease, or unspecified chronic kidney disease: Secondary | ICD-10-CM | POA: Diagnosis not present

## 2023-11-18 DIAGNOSIS — N1831 Chronic kidney disease, stage 3a: Secondary | ICD-10-CM | POA: Diagnosis not present

## 2023-11-18 DIAGNOSIS — L97828 Non-pressure chronic ulcer of other part of left lower leg with other specified severity: Secondary | ICD-10-CM | POA: Diagnosis not present

## 2023-11-21 DIAGNOSIS — N1831 Chronic kidney disease, stage 3a: Secondary | ICD-10-CM | POA: Diagnosis not present

## 2023-11-21 DIAGNOSIS — I872 Venous insufficiency (chronic) (peripheral): Secondary | ICD-10-CM | POA: Diagnosis not present

## 2023-11-21 DIAGNOSIS — J4489 Other specified chronic obstructive pulmonary disease: Secondary | ICD-10-CM | POA: Diagnosis not present

## 2023-11-21 DIAGNOSIS — L97811 Non-pressure chronic ulcer of other part of right lower leg limited to breakdown of skin: Secondary | ICD-10-CM | POA: Diagnosis not present

## 2023-11-21 DIAGNOSIS — L97828 Non-pressure chronic ulcer of other part of left lower leg with other specified severity: Secondary | ICD-10-CM | POA: Diagnosis not present

## 2023-11-21 DIAGNOSIS — I129 Hypertensive chronic kidney disease with stage 1 through stage 4 chronic kidney disease, or unspecified chronic kidney disease: Secondary | ICD-10-CM | POA: Diagnosis not present

## 2023-11-22 DIAGNOSIS — F331 Major depressive disorder, recurrent, moderate: Secondary | ICD-10-CM | POA: Diagnosis not present

## 2023-11-22 DIAGNOSIS — Z85118 Personal history of other malignant neoplasm of bronchus and lung: Secondary | ICD-10-CM | POA: Diagnosis not present

## 2023-11-22 DIAGNOSIS — G4700 Insomnia, unspecified: Secondary | ICD-10-CM | POA: Diagnosis not present

## 2023-11-22 DIAGNOSIS — M48061 Spinal stenosis, lumbar region without neurogenic claudication: Secondary | ICD-10-CM | POA: Diagnosis not present

## 2023-11-22 DIAGNOSIS — N1831 Chronic kidney disease, stage 3a: Secondary | ICD-10-CM | POA: Diagnosis not present

## 2023-11-22 DIAGNOSIS — M5090 Cervical disc disorder, unspecified, unspecified cervical region: Secondary | ICD-10-CM | POA: Diagnosis not present

## 2023-11-22 DIAGNOSIS — I129 Hypertensive chronic kidney disease with stage 1 through stage 4 chronic kidney disease, or unspecified chronic kidney disease: Secondary | ICD-10-CM | POA: Diagnosis not present

## 2023-11-22 DIAGNOSIS — M810 Age-related osteoporosis without current pathological fracture: Secondary | ICD-10-CM | POA: Diagnosis not present

## 2023-11-22 DIAGNOSIS — K219 Gastro-esophageal reflux disease without esophagitis: Secondary | ICD-10-CM | POA: Diagnosis not present

## 2023-11-22 DIAGNOSIS — J309 Allergic rhinitis, unspecified: Secondary | ICD-10-CM | POA: Diagnosis not present

## 2023-11-22 DIAGNOSIS — I872 Venous insufficiency (chronic) (peripheral): Secondary | ICD-10-CM | POA: Diagnosis not present

## 2023-11-22 DIAGNOSIS — L97811 Non-pressure chronic ulcer of other part of right lower leg limited to breakdown of skin: Secondary | ICD-10-CM | POA: Diagnosis not present

## 2023-11-22 DIAGNOSIS — E559 Vitamin D deficiency, unspecified: Secondary | ICD-10-CM | POA: Diagnosis not present

## 2023-11-22 DIAGNOSIS — M51369 Other intervertebral disc degeneration, lumbar region without mention of lumbar back pain or lower extremity pain: Secondary | ICD-10-CM | POA: Diagnosis not present

## 2023-11-22 DIAGNOSIS — Z96641 Presence of right artificial hip joint: Secondary | ICD-10-CM | POA: Diagnosis not present

## 2023-11-22 DIAGNOSIS — I700 Atherosclerosis of aorta: Secondary | ICD-10-CM | POA: Diagnosis not present

## 2023-11-22 DIAGNOSIS — M47812 Spondylosis without myelopathy or radiculopathy, cervical region: Secondary | ICD-10-CM | POA: Diagnosis not present

## 2023-11-22 DIAGNOSIS — F419 Anxiety disorder, unspecified: Secondary | ICD-10-CM | POA: Diagnosis not present

## 2023-11-22 DIAGNOSIS — L97828 Non-pressure chronic ulcer of other part of left lower leg with other specified severity: Secondary | ICD-10-CM | POA: Diagnosis not present

## 2023-11-22 DIAGNOSIS — Z7951 Long term (current) use of inhaled steroids: Secondary | ICD-10-CM | POA: Diagnosis not present

## 2023-11-22 DIAGNOSIS — E782 Mixed hyperlipidemia: Secondary | ICD-10-CM | POA: Diagnosis not present

## 2023-11-22 DIAGNOSIS — J4489 Other specified chronic obstructive pulmonary disease: Secondary | ICD-10-CM | POA: Diagnosis not present

## 2023-11-25 DIAGNOSIS — L97811 Non-pressure chronic ulcer of other part of right lower leg limited to breakdown of skin: Secondary | ICD-10-CM | POA: Diagnosis not present

## 2023-11-25 DIAGNOSIS — I872 Venous insufficiency (chronic) (peripheral): Secondary | ICD-10-CM | POA: Diagnosis not present

## 2023-11-25 DIAGNOSIS — J4489 Other specified chronic obstructive pulmonary disease: Secondary | ICD-10-CM | POA: Diagnosis not present

## 2023-11-25 DIAGNOSIS — L97828 Non-pressure chronic ulcer of other part of left lower leg with other specified severity: Secondary | ICD-10-CM | POA: Diagnosis not present

## 2023-11-25 DIAGNOSIS — N1831 Chronic kidney disease, stage 3a: Secondary | ICD-10-CM | POA: Diagnosis not present

## 2023-11-25 DIAGNOSIS — I129 Hypertensive chronic kidney disease with stage 1 through stage 4 chronic kidney disease, or unspecified chronic kidney disease: Secondary | ICD-10-CM | POA: Diagnosis not present

## 2023-11-28 DIAGNOSIS — L97828 Non-pressure chronic ulcer of other part of left lower leg with other specified severity: Secondary | ICD-10-CM | POA: Diagnosis not present

## 2023-11-28 DIAGNOSIS — J4489 Other specified chronic obstructive pulmonary disease: Secondary | ICD-10-CM | POA: Diagnosis not present

## 2023-11-28 DIAGNOSIS — I872 Venous insufficiency (chronic) (peripheral): Secondary | ICD-10-CM | POA: Diagnosis not present

## 2023-11-28 DIAGNOSIS — L97811 Non-pressure chronic ulcer of other part of right lower leg limited to breakdown of skin: Secondary | ICD-10-CM | POA: Diagnosis not present

## 2023-11-28 DIAGNOSIS — I129 Hypertensive chronic kidney disease with stage 1 through stage 4 chronic kidney disease, or unspecified chronic kidney disease: Secondary | ICD-10-CM | POA: Diagnosis not present

## 2023-11-28 DIAGNOSIS — N1831 Chronic kidney disease, stage 3a: Secondary | ICD-10-CM | POA: Diagnosis not present

## 2023-12-02 DIAGNOSIS — I872 Venous insufficiency (chronic) (peripheral): Secondary | ICD-10-CM | POA: Diagnosis not present

## 2023-12-02 DIAGNOSIS — I129 Hypertensive chronic kidney disease with stage 1 through stage 4 chronic kidney disease, or unspecified chronic kidney disease: Secondary | ICD-10-CM | POA: Diagnosis not present

## 2023-12-02 DIAGNOSIS — L97811 Non-pressure chronic ulcer of other part of right lower leg limited to breakdown of skin: Secondary | ICD-10-CM | POA: Diagnosis not present

## 2023-12-02 DIAGNOSIS — N1831 Chronic kidney disease, stage 3a: Secondary | ICD-10-CM | POA: Diagnosis not present

## 2023-12-02 DIAGNOSIS — L97828 Non-pressure chronic ulcer of other part of left lower leg with other specified severity: Secondary | ICD-10-CM | POA: Diagnosis not present

## 2023-12-02 DIAGNOSIS — J4489 Other specified chronic obstructive pulmonary disease: Secondary | ICD-10-CM | POA: Diagnosis not present

## 2023-12-05 DIAGNOSIS — I872 Venous insufficiency (chronic) (peripheral): Secondary | ICD-10-CM | POA: Diagnosis not present

## 2023-12-05 DIAGNOSIS — L97828 Non-pressure chronic ulcer of other part of left lower leg with other specified severity: Secondary | ICD-10-CM | POA: Diagnosis not present

## 2023-12-05 DIAGNOSIS — J4489 Other specified chronic obstructive pulmonary disease: Secondary | ICD-10-CM | POA: Diagnosis not present

## 2023-12-05 DIAGNOSIS — L97811 Non-pressure chronic ulcer of other part of right lower leg limited to breakdown of skin: Secondary | ICD-10-CM | POA: Diagnosis not present

## 2023-12-05 DIAGNOSIS — N1831 Chronic kidney disease, stage 3a: Secondary | ICD-10-CM | POA: Diagnosis not present

## 2023-12-05 DIAGNOSIS — I129 Hypertensive chronic kidney disease with stage 1 through stage 4 chronic kidney disease, or unspecified chronic kidney disease: Secondary | ICD-10-CM | POA: Diagnosis not present

## 2023-12-09 DIAGNOSIS — L97828 Non-pressure chronic ulcer of other part of left lower leg with other specified severity: Secondary | ICD-10-CM | POA: Diagnosis not present

## 2023-12-09 DIAGNOSIS — L97811 Non-pressure chronic ulcer of other part of right lower leg limited to breakdown of skin: Secondary | ICD-10-CM | POA: Diagnosis not present

## 2023-12-09 DIAGNOSIS — J4489 Other specified chronic obstructive pulmonary disease: Secondary | ICD-10-CM | POA: Diagnosis not present

## 2023-12-09 DIAGNOSIS — I129 Hypertensive chronic kidney disease with stage 1 through stage 4 chronic kidney disease, or unspecified chronic kidney disease: Secondary | ICD-10-CM | POA: Diagnosis not present

## 2023-12-09 DIAGNOSIS — I872 Venous insufficiency (chronic) (peripheral): Secondary | ICD-10-CM | POA: Diagnosis not present

## 2023-12-09 DIAGNOSIS — N1831 Chronic kidney disease, stage 3a: Secondary | ICD-10-CM | POA: Diagnosis not present

## 2023-12-12 DIAGNOSIS — L97811 Non-pressure chronic ulcer of other part of right lower leg limited to breakdown of skin: Secondary | ICD-10-CM | POA: Diagnosis not present

## 2023-12-12 DIAGNOSIS — I872 Venous insufficiency (chronic) (peripheral): Secondary | ICD-10-CM | POA: Diagnosis not present

## 2023-12-12 DIAGNOSIS — J4489 Other specified chronic obstructive pulmonary disease: Secondary | ICD-10-CM | POA: Diagnosis not present

## 2023-12-12 DIAGNOSIS — N1831 Chronic kidney disease, stage 3a: Secondary | ICD-10-CM | POA: Diagnosis not present

## 2023-12-12 DIAGNOSIS — I129 Hypertensive chronic kidney disease with stage 1 through stage 4 chronic kidney disease, or unspecified chronic kidney disease: Secondary | ICD-10-CM | POA: Diagnosis not present

## 2023-12-12 DIAGNOSIS — L97828 Non-pressure chronic ulcer of other part of left lower leg with other specified severity: Secondary | ICD-10-CM | POA: Diagnosis not present

## 2023-12-16 DIAGNOSIS — L97811 Non-pressure chronic ulcer of other part of right lower leg limited to breakdown of skin: Secondary | ICD-10-CM | POA: Diagnosis not present

## 2023-12-16 DIAGNOSIS — N1831 Chronic kidney disease, stage 3a: Secondary | ICD-10-CM | POA: Diagnosis not present

## 2023-12-16 DIAGNOSIS — I872 Venous insufficiency (chronic) (peripheral): Secondary | ICD-10-CM | POA: Diagnosis not present

## 2023-12-16 DIAGNOSIS — I129 Hypertensive chronic kidney disease with stage 1 through stage 4 chronic kidney disease, or unspecified chronic kidney disease: Secondary | ICD-10-CM | POA: Diagnosis not present

## 2023-12-16 DIAGNOSIS — L97828 Non-pressure chronic ulcer of other part of left lower leg with other specified severity: Secondary | ICD-10-CM | POA: Diagnosis not present

## 2023-12-16 DIAGNOSIS — J4489 Other specified chronic obstructive pulmonary disease: Secondary | ICD-10-CM | POA: Diagnosis not present

## 2023-12-19 DIAGNOSIS — J4489 Other specified chronic obstructive pulmonary disease: Secondary | ICD-10-CM | POA: Diagnosis not present

## 2023-12-19 DIAGNOSIS — N1831 Chronic kidney disease, stage 3a: Secondary | ICD-10-CM | POA: Diagnosis not present

## 2023-12-19 DIAGNOSIS — L97811 Non-pressure chronic ulcer of other part of right lower leg limited to breakdown of skin: Secondary | ICD-10-CM | POA: Diagnosis not present

## 2023-12-19 DIAGNOSIS — I129 Hypertensive chronic kidney disease with stage 1 through stage 4 chronic kidney disease, or unspecified chronic kidney disease: Secondary | ICD-10-CM | POA: Diagnosis not present

## 2023-12-19 DIAGNOSIS — L97828 Non-pressure chronic ulcer of other part of left lower leg with other specified severity: Secondary | ICD-10-CM | POA: Diagnosis not present

## 2023-12-19 DIAGNOSIS — I872 Venous insufficiency (chronic) (peripheral): Secondary | ICD-10-CM | POA: Diagnosis not present

## 2023-12-22 DIAGNOSIS — M47812 Spondylosis without myelopathy or radiculopathy, cervical region: Secondary | ICD-10-CM | POA: Diagnosis not present

## 2023-12-22 DIAGNOSIS — E559 Vitamin D deficiency, unspecified: Secondary | ICD-10-CM | POA: Diagnosis not present

## 2023-12-22 DIAGNOSIS — I700 Atherosclerosis of aorta: Secondary | ICD-10-CM | POA: Diagnosis not present

## 2023-12-22 DIAGNOSIS — F331 Major depressive disorder, recurrent, moderate: Secondary | ICD-10-CM | POA: Diagnosis not present

## 2023-12-22 DIAGNOSIS — M51369 Other intervertebral disc degeneration, lumbar region without mention of lumbar back pain or lower extremity pain: Secondary | ICD-10-CM | POA: Diagnosis not present

## 2023-12-22 DIAGNOSIS — J4489 Other specified chronic obstructive pulmonary disease: Secondary | ICD-10-CM | POA: Diagnosis not present

## 2023-12-22 DIAGNOSIS — I872 Venous insufficiency (chronic) (peripheral): Secondary | ICD-10-CM | POA: Diagnosis not present

## 2023-12-22 DIAGNOSIS — Z96641 Presence of right artificial hip joint: Secondary | ICD-10-CM | POA: Diagnosis not present

## 2023-12-22 DIAGNOSIS — F419 Anxiety disorder, unspecified: Secondary | ICD-10-CM | POA: Diagnosis not present

## 2023-12-22 DIAGNOSIS — Z7951 Long term (current) use of inhaled steroids: Secondary | ICD-10-CM | POA: Diagnosis not present

## 2023-12-22 DIAGNOSIS — L97811 Non-pressure chronic ulcer of other part of right lower leg limited to breakdown of skin: Secondary | ICD-10-CM | POA: Diagnosis not present

## 2023-12-22 DIAGNOSIS — N1831 Chronic kidney disease, stage 3a: Secondary | ICD-10-CM | POA: Diagnosis not present

## 2023-12-22 DIAGNOSIS — M48061 Spinal stenosis, lumbar region without neurogenic claudication: Secondary | ICD-10-CM | POA: Diagnosis not present

## 2023-12-22 DIAGNOSIS — I129 Hypertensive chronic kidney disease with stage 1 through stage 4 chronic kidney disease, or unspecified chronic kidney disease: Secondary | ICD-10-CM | POA: Diagnosis not present

## 2023-12-22 DIAGNOSIS — M810 Age-related osteoporosis without current pathological fracture: Secondary | ICD-10-CM | POA: Diagnosis not present

## 2023-12-22 DIAGNOSIS — K219 Gastro-esophageal reflux disease without esophagitis: Secondary | ICD-10-CM | POA: Diagnosis not present

## 2023-12-22 DIAGNOSIS — G4700 Insomnia, unspecified: Secondary | ICD-10-CM | POA: Diagnosis not present

## 2023-12-22 DIAGNOSIS — J309 Allergic rhinitis, unspecified: Secondary | ICD-10-CM | POA: Diagnosis not present

## 2023-12-22 DIAGNOSIS — M5090 Cervical disc disorder, unspecified, unspecified cervical region: Secondary | ICD-10-CM | POA: Diagnosis not present

## 2023-12-22 DIAGNOSIS — Z85118 Personal history of other malignant neoplasm of bronchus and lung: Secondary | ICD-10-CM | POA: Diagnosis not present

## 2023-12-22 DIAGNOSIS — E782 Mixed hyperlipidemia: Secondary | ICD-10-CM | POA: Diagnosis not present

## 2023-12-22 DIAGNOSIS — L97828 Non-pressure chronic ulcer of other part of left lower leg with other specified severity: Secondary | ICD-10-CM | POA: Diagnosis not present

## 2023-12-26 DIAGNOSIS — L97828 Non-pressure chronic ulcer of other part of left lower leg with other specified severity: Secondary | ICD-10-CM | POA: Diagnosis not present

## 2023-12-26 DIAGNOSIS — I872 Venous insufficiency (chronic) (peripheral): Secondary | ICD-10-CM | POA: Diagnosis not present

## 2023-12-26 DIAGNOSIS — J4489 Other specified chronic obstructive pulmonary disease: Secondary | ICD-10-CM | POA: Diagnosis not present

## 2023-12-26 DIAGNOSIS — N1831 Chronic kidney disease, stage 3a: Secondary | ICD-10-CM | POA: Diagnosis not present

## 2023-12-26 DIAGNOSIS — L97811 Non-pressure chronic ulcer of other part of right lower leg limited to breakdown of skin: Secondary | ICD-10-CM | POA: Diagnosis not present

## 2023-12-26 DIAGNOSIS — I129 Hypertensive chronic kidney disease with stage 1 through stage 4 chronic kidney disease, or unspecified chronic kidney disease: Secondary | ICD-10-CM | POA: Diagnosis not present

## 2023-12-30 DIAGNOSIS — I129 Hypertensive chronic kidney disease with stage 1 through stage 4 chronic kidney disease, or unspecified chronic kidney disease: Secondary | ICD-10-CM | POA: Diagnosis not present

## 2023-12-30 DIAGNOSIS — N1831 Chronic kidney disease, stage 3a: Secondary | ICD-10-CM | POA: Diagnosis not present

## 2023-12-30 DIAGNOSIS — L97811 Non-pressure chronic ulcer of other part of right lower leg limited to breakdown of skin: Secondary | ICD-10-CM | POA: Diagnosis not present

## 2023-12-30 DIAGNOSIS — J4489 Other specified chronic obstructive pulmonary disease: Secondary | ICD-10-CM | POA: Diagnosis not present

## 2023-12-30 DIAGNOSIS — L97828 Non-pressure chronic ulcer of other part of left lower leg with other specified severity: Secondary | ICD-10-CM | POA: Diagnosis not present

## 2023-12-30 DIAGNOSIS — I872 Venous insufficiency (chronic) (peripheral): Secondary | ICD-10-CM | POA: Diagnosis not present

## 2024-01-02 DIAGNOSIS — I129 Hypertensive chronic kidney disease with stage 1 through stage 4 chronic kidney disease, or unspecified chronic kidney disease: Secondary | ICD-10-CM | POA: Diagnosis not present

## 2024-01-02 DIAGNOSIS — L97828 Non-pressure chronic ulcer of other part of left lower leg with other specified severity: Secondary | ICD-10-CM | POA: Diagnosis not present

## 2024-01-02 DIAGNOSIS — J4489 Other specified chronic obstructive pulmonary disease: Secondary | ICD-10-CM | POA: Diagnosis not present

## 2024-01-02 DIAGNOSIS — N1831 Chronic kidney disease, stage 3a: Secondary | ICD-10-CM | POA: Diagnosis not present

## 2024-01-02 DIAGNOSIS — I872 Venous insufficiency (chronic) (peripheral): Secondary | ICD-10-CM | POA: Diagnosis not present

## 2024-01-02 DIAGNOSIS — L97811 Non-pressure chronic ulcer of other part of right lower leg limited to breakdown of skin: Secondary | ICD-10-CM | POA: Diagnosis not present

## 2024-01-06 DIAGNOSIS — I129 Hypertensive chronic kidney disease with stage 1 through stage 4 chronic kidney disease, or unspecified chronic kidney disease: Secondary | ICD-10-CM | POA: Diagnosis not present

## 2024-01-06 DIAGNOSIS — J4489 Other specified chronic obstructive pulmonary disease: Secondary | ICD-10-CM | POA: Diagnosis not present

## 2024-01-06 DIAGNOSIS — L97828 Non-pressure chronic ulcer of other part of left lower leg with other specified severity: Secondary | ICD-10-CM | POA: Diagnosis not present

## 2024-01-06 DIAGNOSIS — N1831 Chronic kidney disease, stage 3a: Secondary | ICD-10-CM | POA: Diagnosis not present

## 2024-01-06 DIAGNOSIS — L97811 Non-pressure chronic ulcer of other part of right lower leg limited to breakdown of skin: Secondary | ICD-10-CM | POA: Diagnosis not present

## 2024-01-06 DIAGNOSIS — I872 Venous insufficiency (chronic) (peripheral): Secondary | ICD-10-CM | POA: Diagnosis not present

## 2024-01-09 DIAGNOSIS — J4489 Other specified chronic obstructive pulmonary disease: Secondary | ICD-10-CM | POA: Diagnosis not present

## 2024-01-09 DIAGNOSIS — I872 Venous insufficiency (chronic) (peripheral): Secondary | ICD-10-CM | POA: Diagnosis not present

## 2024-01-09 DIAGNOSIS — N1831 Chronic kidney disease, stage 3a: Secondary | ICD-10-CM | POA: Diagnosis not present

## 2024-01-09 DIAGNOSIS — L97811 Non-pressure chronic ulcer of other part of right lower leg limited to breakdown of skin: Secondary | ICD-10-CM | POA: Diagnosis not present

## 2024-01-09 DIAGNOSIS — L97828 Non-pressure chronic ulcer of other part of left lower leg with other specified severity: Secondary | ICD-10-CM | POA: Diagnosis not present

## 2024-01-09 DIAGNOSIS — I129 Hypertensive chronic kidney disease with stage 1 through stage 4 chronic kidney disease, or unspecified chronic kidney disease: Secondary | ICD-10-CM | POA: Diagnosis not present

## 2024-01-13 ENCOUNTER — Ambulatory Visit: Payer: Medicare Other | Admitting: Podiatry

## 2024-01-13 DIAGNOSIS — L97828 Non-pressure chronic ulcer of other part of left lower leg with other specified severity: Secondary | ICD-10-CM | POA: Diagnosis not present

## 2024-01-13 DIAGNOSIS — L97811 Non-pressure chronic ulcer of other part of right lower leg limited to breakdown of skin: Secondary | ICD-10-CM | POA: Diagnosis not present

## 2024-01-13 DIAGNOSIS — N1831 Chronic kidney disease, stage 3a: Secondary | ICD-10-CM | POA: Diagnosis not present

## 2024-01-13 DIAGNOSIS — J4489 Other specified chronic obstructive pulmonary disease: Secondary | ICD-10-CM | POA: Diagnosis not present

## 2024-01-13 DIAGNOSIS — I872 Venous insufficiency (chronic) (peripheral): Secondary | ICD-10-CM | POA: Diagnosis not present

## 2024-01-13 DIAGNOSIS — I129 Hypertensive chronic kidney disease with stage 1 through stage 4 chronic kidney disease, or unspecified chronic kidney disease: Secondary | ICD-10-CM | POA: Diagnosis not present

## 2024-01-16 DIAGNOSIS — L97811 Non-pressure chronic ulcer of other part of right lower leg limited to breakdown of skin: Secondary | ICD-10-CM | POA: Diagnosis not present

## 2024-01-16 DIAGNOSIS — L97828 Non-pressure chronic ulcer of other part of left lower leg with other specified severity: Secondary | ICD-10-CM | POA: Diagnosis not present

## 2024-01-16 DIAGNOSIS — I129 Hypertensive chronic kidney disease with stage 1 through stage 4 chronic kidney disease, or unspecified chronic kidney disease: Secondary | ICD-10-CM | POA: Diagnosis not present

## 2024-01-16 DIAGNOSIS — I872 Venous insufficiency (chronic) (peripheral): Secondary | ICD-10-CM | POA: Diagnosis not present

## 2024-01-16 DIAGNOSIS — N1831 Chronic kidney disease, stage 3a: Secondary | ICD-10-CM | POA: Diagnosis not present

## 2024-01-16 DIAGNOSIS — J4489 Other specified chronic obstructive pulmonary disease: Secondary | ICD-10-CM | POA: Diagnosis not present

## 2024-01-20 DIAGNOSIS — J4489 Other specified chronic obstructive pulmonary disease: Secondary | ICD-10-CM | POA: Diagnosis not present

## 2024-01-20 DIAGNOSIS — I872 Venous insufficiency (chronic) (peripheral): Secondary | ICD-10-CM | POA: Diagnosis not present

## 2024-01-20 DIAGNOSIS — L97828 Non-pressure chronic ulcer of other part of left lower leg with other specified severity: Secondary | ICD-10-CM | POA: Diagnosis not present

## 2024-01-20 DIAGNOSIS — L97811 Non-pressure chronic ulcer of other part of right lower leg limited to breakdown of skin: Secondary | ICD-10-CM | POA: Diagnosis not present

## 2024-01-20 DIAGNOSIS — N1831 Chronic kidney disease, stage 3a: Secondary | ICD-10-CM | POA: Diagnosis not present

## 2024-01-20 DIAGNOSIS — I129 Hypertensive chronic kidney disease with stage 1 through stage 4 chronic kidney disease, or unspecified chronic kidney disease: Secondary | ICD-10-CM | POA: Diagnosis not present

## 2024-01-21 DIAGNOSIS — I129 Hypertensive chronic kidney disease with stage 1 through stage 4 chronic kidney disease, or unspecified chronic kidney disease: Secondary | ICD-10-CM | POA: Diagnosis not present

## 2024-01-21 DIAGNOSIS — M5090 Cervical disc disorder, unspecified, unspecified cervical region: Secondary | ICD-10-CM | POA: Diagnosis not present

## 2024-01-21 DIAGNOSIS — Z85118 Personal history of other malignant neoplasm of bronchus and lung: Secondary | ICD-10-CM | POA: Diagnosis not present

## 2024-01-21 DIAGNOSIS — M810 Age-related osteoporosis without current pathological fracture: Secondary | ICD-10-CM | POA: Diagnosis not present

## 2024-01-21 DIAGNOSIS — E782 Mixed hyperlipidemia: Secondary | ICD-10-CM | POA: Diagnosis not present

## 2024-01-21 DIAGNOSIS — L97828 Non-pressure chronic ulcer of other part of left lower leg with other specified severity: Secondary | ICD-10-CM | POA: Diagnosis not present

## 2024-01-21 DIAGNOSIS — Z7951 Long term (current) use of inhaled steroids: Secondary | ICD-10-CM | POA: Diagnosis not present

## 2024-01-21 DIAGNOSIS — I700 Atherosclerosis of aorta: Secondary | ICD-10-CM | POA: Diagnosis not present

## 2024-01-21 DIAGNOSIS — I872 Venous insufficiency (chronic) (peripheral): Secondary | ICD-10-CM | POA: Diagnosis not present

## 2024-01-21 DIAGNOSIS — L97811 Non-pressure chronic ulcer of other part of right lower leg limited to breakdown of skin: Secondary | ICD-10-CM | POA: Diagnosis not present

## 2024-01-21 DIAGNOSIS — G4700 Insomnia, unspecified: Secondary | ICD-10-CM | POA: Diagnosis not present

## 2024-01-21 DIAGNOSIS — K219 Gastro-esophageal reflux disease without esophagitis: Secondary | ICD-10-CM | POA: Diagnosis not present

## 2024-01-21 DIAGNOSIS — F331 Major depressive disorder, recurrent, moderate: Secondary | ICD-10-CM | POA: Diagnosis not present

## 2024-01-21 DIAGNOSIS — E559 Vitamin D deficiency, unspecified: Secondary | ICD-10-CM | POA: Diagnosis not present

## 2024-01-21 DIAGNOSIS — M48061 Spinal stenosis, lumbar region without neurogenic claudication: Secondary | ICD-10-CM | POA: Diagnosis not present

## 2024-01-21 DIAGNOSIS — Z96641 Presence of right artificial hip joint: Secondary | ICD-10-CM | POA: Diagnosis not present

## 2024-01-21 DIAGNOSIS — N1831 Chronic kidney disease, stage 3a: Secondary | ICD-10-CM | POA: Diagnosis not present

## 2024-01-21 DIAGNOSIS — J309 Allergic rhinitis, unspecified: Secondary | ICD-10-CM | POA: Diagnosis not present

## 2024-01-21 DIAGNOSIS — J4489 Other specified chronic obstructive pulmonary disease: Secondary | ICD-10-CM | POA: Diagnosis not present

## 2024-01-21 DIAGNOSIS — M51369 Other intervertebral disc degeneration, lumbar region without mention of lumbar back pain or lower extremity pain: Secondary | ICD-10-CM | POA: Diagnosis not present

## 2024-01-21 DIAGNOSIS — M47812 Spondylosis without myelopathy or radiculopathy, cervical region: Secondary | ICD-10-CM | POA: Diagnosis not present

## 2024-01-21 DIAGNOSIS — F419 Anxiety disorder, unspecified: Secondary | ICD-10-CM | POA: Diagnosis not present

## 2024-01-23 DIAGNOSIS — L97828 Non-pressure chronic ulcer of other part of left lower leg with other specified severity: Secondary | ICD-10-CM | POA: Diagnosis not present

## 2024-01-23 DIAGNOSIS — L97811 Non-pressure chronic ulcer of other part of right lower leg limited to breakdown of skin: Secondary | ICD-10-CM | POA: Diagnosis not present

## 2024-01-23 DIAGNOSIS — I872 Venous insufficiency (chronic) (peripheral): Secondary | ICD-10-CM | POA: Diagnosis not present

## 2024-01-23 DIAGNOSIS — I129 Hypertensive chronic kidney disease with stage 1 through stage 4 chronic kidney disease, or unspecified chronic kidney disease: Secondary | ICD-10-CM | POA: Diagnosis not present

## 2024-01-23 DIAGNOSIS — N1831 Chronic kidney disease, stage 3a: Secondary | ICD-10-CM | POA: Diagnosis not present

## 2024-01-23 DIAGNOSIS — J4489 Other specified chronic obstructive pulmonary disease: Secondary | ICD-10-CM | POA: Diagnosis not present

## 2024-01-27 DIAGNOSIS — J4489 Other specified chronic obstructive pulmonary disease: Secondary | ICD-10-CM | POA: Diagnosis not present

## 2024-01-27 DIAGNOSIS — L97811 Non-pressure chronic ulcer of other part of right lower leg limited to breakdown of skin: Secondary | ICD-10-CM | POA: Diagnosis not present

## 2024-01-27 DIAGNOSIS — I872 Venous insufficiency (chronic) (peripheral): Secondary | ICD-10-CM | POA: Diagnosis not present

## 2024-01-27 DIAGNOSIS — I129 Hypertensive chronic kidney disease with stage 1 through stage 4 chronic kidney disease, or unspecified chronic kidney disease: Secondary | ICD-10-CM | POA: Diagnosis not present

## 2024-01-27 DIAGNOSIS — N1831 Chronic kidney disease, stage 3a: Secondary | ICD-10-CM | POA: Diagnosis not present

## 2024-01-27 DIAGNOSIS — L97828 Non-pressure chronic ulcer of other part of left lower leg with other specified severity: Secondary | ICD-10-CM | POA: Diagnosis not present

## 2024-01-29 DIAGNOSIS — I872 Venous insufficiency (chronic) (peripheral): Secondary | ICD-10-CM | POA: Diagnosis not present

## 2024-01-29 DIAGNOSIS — J4489 Other specified chronic obstructive pulmonary disease: Secondary | ICD-10-CM | POA: Diagnosis not present

## 2024-01-29 DIAGNOSIS — L97828 Non-pressure chronic ulcer of other part of left lower leg with other specified severity: Secondary | ICD-10-CM | POA: Diagnosis not present

## 2024-01-29 DIAGNOSIS — N1831 Chronic kidney disease, stage 3a: Secondary | ICD-10-CM | POA: Diagnosis not present

## 2024-01-29 DIAGNOSIS — I129 Hypertensive chronic kidney disease with stage 1 through stage 4 chronic kidney disease, or unspecified chronic kidney disease: Secondary | ICD-10-CM | POA: Diagnosis not present

## 2024-01-29 DIAGNOSIS — L97811 Non-pressure chronic ulcer of other part of right lower leg limited to breakdown of skin: Secondary | ICD-10-CM | POA: Diagnosis not present

## 2024-02-02 DIAGNOSIS — M1990 Unspecified osteoarthritis, unspecified site: Secondary | ICD-10-CM | POA: Diagnosis not present

## 2024-02-02 DIAGNOSIS — E782 Mixed hyperlipidemia: Secondary | ICD-10-CM | POA: Diagnosis not present

## 2024-02-02 DIAGNOSIS — F331 Major depressive disorder, recurrent, moderate: Secondary | ICD-10-CM | POA: Diagnosis not present

## 2024-02-02 DIAGNOSIS — I10 Essential (primary) hypertension: Secondary | ICD-10-CM | POA: Diagnosis not present

## 2024-02-03 ENCOUNTER — Ambulatory Visit (INDEPENDENT_AMBULATORY_CARE_PROVIDER_SITE_OTHER): Admitting: Podiatry

## 2024-02-03 ENCOUNTER — Encounter: Payer: Self-pay | Admitting: Podiatry

## 2024-02-03 DIAGNOSIS — M79675 Pain in left toe(s): Secondary | ICD-10-CM

## 2024-02-03 DIAGNOSIS — B351 Tinea unguium: Secondary | ICD-10-CM | POA: Diagnosis not present

## 2024-02-03 DIAGNOSIS — M79674 Pain in right toe(s): Secondary | ICD-10-CM | POA: Diagnosis not present

## 2024-02-03 NOTE — Progress Notes (Unsigned)
  Subjective:  Patient ID: [name] [name], female    DOB: 1927-07-02,  MRN: [id number]  89 y.o. female presents to clinic with  painful, elongated thickened toenails x 10 which are symptomatic when wearing enclosed shoe gear. This interferes with his/her daily activities. She is accompanied by her granddaughter on today's visit. Chief Complaint  Patient presents with   rfc    She is here for a nail trim, PCP is dr Donette Larry and seen every 6 months.    New problem(s): None   PCP is Georgann Housekeeper, MD. Last visit was January, 2025.  Allergies  Allergen Reactions   Aspirin Other (See Comments)      GI upset in large amounts   Codeine Sulfate Other (See Comments)     GI upset, headache   Penicillins Rash   Septra Ds [Sulfamethoxazole-Trimethoprim] Rash    Review of Systems: Negative except as noted in the HPI.   Objective:  [name] [name] is a pleasant 89 y.o. female WD, WN in NAD. AAO x 3.  Vascular Examination: Vascular status intact b/l with palpable pedal pulses. CFT immediate b/l. No edema. No pain with calf compression b/l. Skin temperature gradient WNL b/l. Pedal hair absent.  Neurological Examination: Sensation grossly intact b/l with 10 gram monofilament.   Dermatological Examination: Pedal skin with normal turgor, texture and tone b/l. Toenails 1-5 b/l thick, discolored, elongated with subungual debris and pain on dorsal palpation. No corns, calluses nor porokeratotic lesions noted.  Musculoskeletal Examination: Muscle strength 5/5 to b/l LE. No pain, crepitus or joint limitation noted with ROM bilateral LE. No gross bony deformities bilaterally. Utilizes cane for ambulation assistance.  Radiographs: None  Last A1c:       No data to display          Assessment:   1. Pain due to onychomycosis of toenails of both feet    Plan:  Patient was evaluated and treated. All patient's and/or POA's questions/concerns addressed on today's visit. Mycotic toenails 1-5  debrided in length and girth without incident. Continue soft, supportive shoe gear daily. Report any pedal injuries to medical professional. Call office if there are any quesitons/concerns. -Patient/POA to call should there be question/concern in the interim.  Return in about 3 months (around 05/04/2024).  Freddie Breech, DPM      [place] LOCATION: [address], [place] [address]                   Office [phone]   [facility] LOCATION: [address] [place], [place] [address] Office [phone]

## 2024-02-04 DIAGNOSIS — J4489 Other specified chronic obstructive pulmonary disease: Secondary | ICD-10-CM | POA: Diagnosis not present

## 2024-02-04 DIAGNOSIS — N1831 Chronic kidney disease, stage 3a: Secondary | ICD-10-CM | POA: Diagnosis not present

## 2024-02-04 DIAGNOSIS — I872 Venous insufficiency (chronic) (peripheral): Secondary | ICD-10-CM | POA: Diagnosis not present

## 2024-02-04 DIAGNOSIS — L97828 Non-pressure chronic ulcer of other part of left lower leg with other specified severity: Secondary | ICD-10-CM | POA: Diagnosis not present

## 2024-02-04 DIAGNOSIS — I129 Hypertensive chronic kidney disease with stage 1 through stage 4 chronic kidney disease, or unspecified chronic kidney disease: Secondary | ICD-10-CM | POA: Diagnosis not present

## 2024-02-04 DIAGNOSIS — L97811 Non-pressure chronic ulcer of other part of right lower leg limited to breakdown of skin: Secondary | ICD-10-CM | POA: Diagnosis not present

## 2024-02-06 DIAGNOSIS — L97811 Non-pressure chronic ulcer of other part of right lower leg limited to breakdown of skin: Secondary | ICD-10-CM | POA: Diagnosis not present

## 2024-02-06 DIAGNOSIS — I129 Hypertensive chronic kidney disease with stage 1 through stage 4 chronic kidney disease, or unspecified chronic kidney disease: Secondary | ICD-10-CM | POA: Diagnosis not present

## 2024-02-06 DIAGNOSIS — N1831 Chronic kidney disease, stage 3a: Secondary | ICD-10-CM | POA: Diagnosis not present

## 2024-02-06 DIAGNOSIS — J4489 Other specified chronic obstructive pulmonary disease: Secondary | ICD-10-CM | POA: Diagnosis not present

## 2024-02-06 DIAGNOSIS — L97828 Non-pressure chronic ulcer of other part of left lower leg with other specified severity: Secondary | ICD-10-CM | POA: Diagnosis not present

## 2024-02-06 DIAGNOSIS — I872 Venous insufficiency (chronic) (peripheral): Secondary | ICD-10-CM | POA: Diagnosis not present

## 2024-02-10 DIAGNOSIS — J4489 Other specified chronic obstructive pulmonary disease: Secondary | ICD-10-CM | POA: Diagnosis not present

## 2024-02-10 DIAGNOSIS — I129 Hypertensive chronic kidney disease with stage 1 through stage 4 chronic kidney disease, or unspecified chronic kidney disease: Secondary | ICD-10-CM | POA: Diagnosis not present

## 2024-02-10 DIAGNOSIS — N1831 Chronic kidney disease, stage 3a: Secondary | ICD-10-CM | POA: Diagnosis not present

## 2024-02-10 DIAGNOSIS — I872 Venous insufficiency (chronic) (peripheral): Secondary | ICD-10-CM | POA: Diagnosis not present

## 2024-02-10 DIAGNOSIS — L97811 Non-pressure chronic ulcer of other part of right lower leg limited to breakdown of skin: Secondary | ICD-10-CM | POA: Diagnosis not present

## 2024-02-10 DIAGNOSIS — L97828 Non-pressure chronic ulcer of other part of left lower leg with other specified severity: Secondary | ICD-10-CM | POA: Diagnosis not present

## 2024-02-13 DIAGNOSIS — J4489 Other specified chronic obstructive pulmonary disease: Secondary | ICD-10-CM | POA: Diagnosis not present

## 2024-02-13 DIAGNOSIS — L97828 Non-pressure chronic ulcer of other part of left lower leg with other specified severity: Secondary | ICD-10-CM | POA: Diagnosis not present

## 2024-02-13 DIAGNOSIS — L97811 Non-pressure chronic ulcer of other part of right lower leg limited to breakdown of skin: Secondary | ICD-10-CM | POA: Diagnosis not present

## 2024-02-13 DIAGNOSIS — N1831 Chronic kidney disease, stage 3a: Secondary | ICD-10-CM | POA: Diagnosis not present

## 2024-02-13 DIAGNOSIS — I129 Hypertensive chronic kidney disease with stage 1 through stage 4 chronic kidney disease, or unspecified chronic kidney disease: Secondary | ICD-10-CM | POA: Diagnosis not present

## 2024-02-13 DIAGNOSIS — I872 Venous insufficiency (chronic) (peripheral): Secondary | ICD-10-CM | POA: Diagnosis not present

## 2024-02-16 DIAGNOSIS — L97828 Non-pressure chronic ulcer of other part of left lower leg with other specified severity: Secondary | ICD-10-CM | POA: Diagnosis not present

## 2024-02-16 DIAGNOSIS — J4489 Other specified chronic obstructive pulmonary disease: Secondary | ICD-10-CM | POA: Diagnosis not present

## 2024-02-16 DIAGNOSIS — I129 Hypertensive chronic kidney disease with stage 1 through stage 4 chronic kidney disease, or unspecified chronic kidney disease: Secondary | ICD-10-CM | POA: Diagnosis not present

## 2024-02-16 DIAGNOSIS — L97811 Non-pressure chronic ulcer of other part of right lower leg limited to breakdown of skin: Secondary | ICD-10-CM | POA: Diagnosis not present

## 2024-02-16 DIAGNOSIS — I872 Venous insufficiency (chronic) (peripheral): Secondary | ICD-10-CM | POA: Diagnosis not present

## 2024-02-16 DIAGNOSIS — N1831 Chronic kidney disease, stage 3a: Secondary | ICD-10-CM | POA: Diagnosis not present

## 2024-03-03 DIAGNOSIS — M1990 Unspecified osteoarthritis, unspecified site: Secondary | ICD-10-CM | POA: Diagnosis not present

## 2024-03-03 DIAGNOSIS — E782 Mixed hyperlipidemia: Secondary | ICD-10-CM | POA: Diagnosis not present

## 2024-03-03 DIAGNOSIS — I10 Essential (primary) hypertension: Secondary | ICD-10-CM | POA: Diagnosis not present

## 2024-03-03 DIAGNOSIS — F331 Major depressive disorder, recurrent, moderate: Secondary | ICD-10-CM | POA: Diagnosis not present

## 2024-04-03 DIAGNOSIS — F331 Major depressive disorder, recurrent, moderate: Secondary | ICD-10-CM | POA: Diagnosis not present

## 2024-04-03 DIAGNOSIS — E782 Mixed hyperlipidemia: Secondary | ICD-10-CM | POA: Diagnosis not present

## 2024-04-03 DIAGNOSIS — I10 Essential (primary) hypertension: Secondary | ICD-10-CM | POA: Diagnosis not present

## 2024-04-03 DIAGNOSIS — M1990 Unspecified osteoarthritis, unspecified site: Secondary | ICD-10-CM | POA: Diagnosis not present

## 2024-05-03 DIAGNOSIS — E782 Mixed hyperlipidemia: Secondary | ICD-10-CM | POA: Diagnosis not present

## 2024-05-03 DIAGNOSIS — M1990 Unspecified osteoarthritis, unspecified site: Secondary | ICD-10-CM | POA: Diagnosis not present

## 2024-05-03 DIAGNOSIS — F331 Major depressive disorder, recurrent, moderate: Secondary | ICD-10-CM | POA: Diagnosis not present

## 2024-05-03 DIAGNOSIS — I10 Essential (primary) hypertension: Secondary | ICD-10-CM | POA: Diagnosis not present

## 2024-05-10 DIAGNOSIS — M810 Age-related osteoporosis without current pathological fracture: Secondary | ICD-10-CM | POA: Diagnosis not present

## 2024-05-10 DIAGNOSIS — Z85118 Personal history of other malignant neoplasm of bronchus and lung: Secondary | ICD-10-CM | POA: Diagnosis not present

## 2024-05-10 DIAGNOSIS — Z23 Encounter for immunization: Secondary | ICD-10-CM | POA: Diagnosis not present

## 2024-05-10 DIAGNOSIS — G4700 Insomnia, unspecified: Secondary | ICD-10-CM | POA: Diagnosis not present

## 2024-05-10 DIAGNOSIS — R269 Unspecified abnormalities of gait and mobility: Secondary | ICD-10-CM | POA: Diagnosis not present

## 2024-05-10 DIAGNOSIS — J449 Chronic obstructive pulmonary disease, unspecified: Secondary | ICD-10-CM | POA: Diagnosis not present

## 2024-05-10 DIAGNOSIS — E782 Mixed hyperlipidemia: Secondary | ICD-10-CM | POA: Diagnosis not present

## 2024-05-10 DIAGNOSIS — I700 Atherosclerosis of aorta: Secondary | ICD-10-CM | POA: Diagnosis not present

## 2024-05-10 DIAGNOSIS — N1831 Chronic kidney disease, stage 3a: Secondary | ICD-10-CM | POA: Diagnosis not present

## 2024-05-10 DIAGNOSIS — Z0000 Encounter for general adult medical examination without abnormal findings: Secondary | ICD-10-CM | POA: Diagnosis not present

## 2024-05-10 DIAGNOSIS — I10 Essential (primary) hypertension: Secondary | ICD-10-CM | POA: Diagnosis not present

## 2024-05-10 DIAGNOSIS — E559 Vitamin D deficiency, unspecified: Secondary | ICD-10-CM | POA: Diagnosis not present

## 2024-05-18 ENCOUNTER — Ambulatory Visit (INDEPENDENT_AMBULATORY_CARE_PROVIDER_SITE_OTHER): Admitting: Podiatry

## 2024-05-18 ENCOUNTER — Encounter: Payer: Self-pay | Admitting: Podiatry

## 2024-05-18 DIAGNOSIS — M79674 Pain in right toe(s): Secondary | ICD-10-CM

## 2024-05-18 DIAGNOSIS — B351 Tinea unguium: Secondary | ICD-10-CM | POA: Diagnosis not present

## 2024-05-18 DIAGNOSIS — M79675 Pain in left toe(s): Secondary | ICD-10-CM

## 2024-05-23 NOTE — Progress Notes (Signed)
  Subjective:  Patient ID: [name] [name], female    DOB: 1927/05/06,  MRN: [id number]  89 y.o. female presents painful thick toenails that are difficult to trim. Pain interferes with ambulation. Aggravating factors include wearing enclosed shoe gear. Pain is relieved with periodic professional debridement. Chief Complaint  Patient presents with   RFC     RFC non diabetic toenail trim. LOV with PCP 05/2024.   New problem(s): None   PCP is Ransom Other, MD , and last visit was March 03, 2024.  Allergies  Allergen Reactions   Aspirin  Other (See Comments)      GI upset in large amounts   Codeine Sulfate Other (See Comments)     GI upset, headache   Penicillins Rash   Septra Ds [Sulfamethoxazole-Trimethoprim] Rash    Review of Systems: Negative except as noted in the HPI.   Objective:  [name] [name] is a pleasant 89 y.o. female WD, WN in NAD. AAO x 3.  Vascular Examination: Vascular status intact b/l with palpable pedal pulses. CFT immediate b/l. Pedal hair present. No edema. No pain with calf compression b/l. Skin temperature gradient WNL b/l. No varicosities noted. No cyanosis or clubbing noted.  Neurological Examination: Sensation grossly intact b/l with 10 gram monofilament. Vibratory sensation intact b/l.  Dermatological Examination: Pedal skin with normal turgor, texture and tone b/l. No open wounds nor interdigital macerations noted. Toenails 1-5 b/l thick, discolored, elongated with subungual debris and pain on dorsal palpation. No hyperkeratotic lesions noted b/l.   Musculoskeletal Examination: Muscle strength 5/5 to b/l LE.  No pain, crepitus noted b/l. No gross bony deformities bilaterally. Utilizes cane for ambulation assistance.  Radiographs: None  Last A1c:       No data to display           Assessment:   1. Pain due to onychomycosis of toenails of both feet    Plan:  Consent given for treatment. Patient examined. All patient's and/or POA's  questions/concerns addressed on today's visit. Toenails 1-5 debrided in length and girth without incident. Continue soft, supportive shoe gear daily. Report any pedal injuries to medical professional. Call office if there are any questions/concerns. -Patient/POA to call should there be question/concern in the interim.  Return in about 9 weeks (around 07/20/2024).  Delon LITTIE Merlin, DPM      [place] LOCATION: [address] [place] [address]                   Office [phone]   [facility] LOCATION: [address] [place], [place] [address] Office [phone]

## 2024-06-03 DIAGNOSIS — F331 Major depressive disorder, recurrent, moderate: Secondary | ICD-10-CM | POA: Diagnosis not present

## 2024-06-03 DIAGNOSIS — M1990 Unspecified osteoarthritis, unspecified site: Secondary | ICD-10-CM | POA: Diagnosis not present

## 2024-06-03 DIAGNOSIS — I10 Essential (primary) hypertension: Secondary | ICD-10-CM | POA: Diagnosis not present

## 2024-06-03 DIAGNOSIS — E782 Mixed hyperlipidemia: Secondary | ICD-10-CM | POA: Diagnosis not present

## 2024-07-04 DIAGNOSIS — E782 Mixed hyperlipidemia: Secondary | ICD-10-CM | POA: Diagnosis not present

## 2024-07-04 DIAGNOSIS — F331 Major depressive disorder, recurrent, moderate: Secondary | ICD-10-CM | POA: Diagnosis not present

## 2024-07-04 DIAGNOSIS — M1990 Unspecified osteoarthritis, unspecified site: Secondary | ICD-10-CM | POA: Diagnosis not present

## 2024-07-04 DIAGNOSIS — I10 Essential (primary) hypertension: Secondary | ICD-10-CM | POA: Diagnosis not present

## 2024-07-13 ENCOUNTER — Ambulatory Visit (INDEPENDENT_AMBULATORY_CARE_PROVIDER_SITE_OTHER): Admitting: Podiatry

## 2024-07-13 DIAGNOSIS — Z91198 Patient's noncompliance with other medical treatment and regimen for other reason: Secondary | ICD-10-CM

## 2024-07-13 NOTE — Progress Notes (Signed)
 1. Failure to attend appointment with reason given    Appointment canceled by patient.

## 2024-07-19 ENCOUNTER — Ambulatory Visit: Admitting: Podiatry

## 2024-07-20 DIAGNOSIS — R4182 Altered mental status, unspecified: Secondary | ICD-10-CM | POA: Diagnosis not present

## 2024-07-20 DIAGNOSIS — J029 Acute pharyngitis, unspecified: Secondary | ICD-10-CM | POA: Diagnosis not present

## 2024-07-20 DIAGNOSIS — J449 Chronic obstructive pulmonary disease, unspecified: Secondary | ICD-10-CM | POA: Diagnosis not present

## 2024-07-20 DIAGNOSIS — R1110 Vomiting, unspecified: Secondary | ICD-10-CM | POA: Diagnosis not present

## 2024-07-20 DIAGNOSIS — R5383 Other fatigue: Secondary | ICD-10-CM | POA: Diagnosis not present

## 2024-07-20 DIAGNOSIS — E860 Dehydration: Secondary | ICD-10-CM | POA: Diagnosis not present

## 2024-08-03 DIAGNOSIS — M1990 Unspecified osteoarthritis, unspecified site: Secondary | ICD-10-CM | POA: Diagnosis not present

## 2024-08-03 DIAGNOSIS — I10 Essential (primary) hypertension: Secondary | ICD-10-CM | POA: Diagnosis not present

## 2024-08-03 DIAGNOSIS — F331 Major depressive disorder, recurrent, moderate: Secondary | ICD-10-CM | POA: Diagnosis not present

## 2024-08-03 DIAGNOSIS — E782 Mixed hyperlipidemia: Secondary | ICD-10-CM | POA: Diagnosis not present

## 2024-08-09 DIAGNOSIS — R4181 Age-related cognitive decline: Secondary | ICD-10-CM | POA: Diagnosis not present

## 2024-08-09 DIAGNOSIS — N1831 Chronic kidney disease, stage 3a: Secondary | ICD-10-CM | POA: Diagnosis not present

## 2024-08-09 DIAGNOSIS — Z23 Encounter for immunization: Secondary | ICD-10-CM | POA: Diagnosis not present

## 2024-08-09 DIAGNOSIS — J449 Chronic obstructive pulmonary disease, unspecified: Secondary | ICD-10-CM | POA: Diagnosis not present

## 2024-08-09 DIAGNOSIS — M5090 Cervical disc disorder, unspecified, unspecified cervical region: Secondary | ICD-10-CM | POA: Diagnosis not present

## 2024-08-09 DIAGNOSIS — R54 Age-related physical debility: Secondary | ICD-10-CM | POA: Diagnosis not present

## 2024-08-09 DIAGNOSIS — R4586 Emotional lability: Secondary | ICD-10-CM | POA: Diagnosis not present

## 2024-08-09 DIAGNOSIS — I872 Venous insufficiency (chronic) (peripheral): Secondary | ICD-10-CM | POA: Diagnosis not present

## 2024-08-09 DIAGNOSIS — Z1331 Encounter for screening for depression: Secondary | ICD-10-CM | POA: Diagnosis not present

## 2024-08-09 DIAGNOSIS — E440 Moderate protein-calorie malnutrition: Secondary | ICD-10-CM | POA: Diagnosis not present

## 2024-08-09 DIAGNOSIS — Z85118 Personal history of other malignant neoplasm of bronchus and lung: Secondary | ICD-10-CM | POA: Diagnosis not present

## 2024-08-09 DIAGNOSIS — I10 Essential (primary) hypertension: Secondary | ICD-10-CM | POA: Diagnosis not present

## 2024-08-31 DIAGNOSIS — J9611 Chronic respiratory failure with hypoxia: Secondary | ICD-10-CM | POA: Diagnosis not present

## 2024-08-31 DIAGNOSIS — Z85118 Personal history of other malignant neoplasm of bronchus and lung: Secondary | ICD-10-CM | POA: Diagnosis not present

## 2024-08-31 DIAGNOSIS — J449 Chronic obstructive pulmonary disease, unspecified: Secondary | ICD-10-CM | POA: Diagnosis not present

## 2024-09-03 DIAGNOSIS — E782 Mixed hyperlipidemia: Secondary | ICD-10-CM | POA: Diagnosis not present

## 2024-09-03 DIAGNOSIS — M1990 Unspecified osteoarthritis, unspecified site: Secondary | ICD-10-CM | POA: Diagnosis not present

## 2024-09-03 DIAGNOSIS — I10 Essential (primary) hypertension: Secondary | ICD-10-CM | POA: Diagnosis not present

## 2024-09-03 DIAGNOSIS — F331 Major depressive disorder, recurrent, moderate: Secondary | ICD-10-CM | POA: Diagnosis not present

## 2024-09-21 ENCOUNTER — Ambulatory Visit: Admitting: Podiatry

## 2024-09-21 ENCOUNTER — Encounter: Payer: Self-pay | Admitting: Podiatry

## 2024-09-21 DIAGNOSIS — M79674 Pain in right toe(s): Secondary | ICD-10-CM | POA: Diagnosis not present

## 2024-09-21 DIAGNOSIS — M79675 Pain in left toe(s): Secondary | ICD-10-CM | POA: Diagnosis not present

## 2024-09-21 DIAGNOSIS — B351 Tinea unguium: Secondary | ICD-10-CM

## 2024-09-21 DIAGNOSIS — S81802A Unspecified open wound, left lower leg, initial encounter: Secondary | ICD-10-CM

## 2024-09-21 NOTE — Progress Notes (Signed)
  Subjective:  Patient ID: [name] [name], female    DOB: June 22, 1927,  MRN: [id number]  [name] [name] presents to clinic today for painful mycotic toenails of both feet that are difficult to trim. Pain interferes with daily activities and wearing enclosed shoe gear comfortably.  Chief Complaint  Patient presents with   Toe Pain    She saw Dr. Ransom in June or July. She denies being diabetic.    New problem(s):Granddaughter states patient has had lower extremity wounds and has been treated by Wound Care in the past. Today, she presents with area on lateral aspecg left lower leg.       PCP is Ransom Other, MD.  Allergies  Allergen Reactions   Aspirin  Other (See Comments)      GI upset in large amounts   Codeine Sulfate Other (See Comments)     GI upset, headache   Penicillins Rash    Other Reaction(s): Not available   Septra Ds [Sulfamethoxazole-Trimethoprim] Rash    Review of Systems: Negative except as noted in the HPI.  Objective:  There were no vitals filed for this visit. [name] [name] is a pleasant 89 y.o. female thin build in NAD. AAO x 3.  Vascular Examination: Trace edema noted b/l lower extremities left >right. Evidence of skin changes consistent with long term venous stasis BLE. No ischemia or gangrene noted b/l LE. No cyanosis or clubbing noted b/l LE. CFT immediate b/l. Pedal hair present.  No pain with calf compression b/l. Skin temperature gradient WNL b/l.   Neurological Examination: Sensation grossly intact b/l with 10 gram monofilament. Vibratory sensation intact b/l.  Dermatological Examination: Quarter size raised crusty lesion with irregular borders mixed brown scales with white fibrotic areas. No surrounding erythema, no edema, no drainage, no odor.  Pedal skin thin, shiny and atrophic b/l.  No interdigital macerations noted. Toenails 1-5 b/l thick, discolored, elongated with subungual debris and pain on dorsal palpation. No hyperkeratotic  lesions noted b/l.   Musculoskeletal Examination: Muscle strength 5/5 to b/l LE.  No pain, crepitus noted b/l. No gross bony deformities bilaterally. Utilizes cane for ambulation assistance.  Radiographs: None  Assessment/Plan: 1. Pain due to onychomycosis of toenails of both feet   -Patient was evaluated today. All questions/concerns addressed on today's visit. -Patient's family member present. All questions/concerns addressed on today's visit. They will reach out to PCP or Wound Care center regarding lesion on left leg. -Examined patient. -Patient to continue soft, supportive shoe gear daily. -Mycotic toenails 1-5 bilaterally were debrided in length and girth with sterile nail nippers and dremel without incident. -Patient/POA to call should there be question/concern in the interim.   Return in about 3 months (around 12/22/2024).  [name] [name], DPM      [place] LOCATION: [address] [place] [address]                   Office [phone]   [facility] LOCATION: [address] [place], [place] [address] Office [phone]

## 2024-10-03 DIAGNOSIS — F331 Major depressive disorder, recurrent, moderate: Secondary | ICD-10-CM | POA: Diagnosis not present

## 2024-10-03 DIAGNOSIS — I10 Essential (primary) hypertension: Secondary | ICD-10-CM | POA: Diagnosis not present

## 2024-10-03 DIAGNOSIS — M1990 Unspecified osteoarthritis, unspecified site: Secondary | ICD-10-CM | POA: Diagnosis not present

## 2024-10-03 DIAGNOSIS — E782 Mixed hyperlipidemia: Secondary | ICD-10-CM | POA: Diagnosis not present

## 2024-10-11 DIAGNOSIS — S81802A Unspecified open wound, left lower leg, initial encounter: Secondary | ICD-10-CM | POA: Diagnosis not present

## 2024-10-11 DIAGNOSIS — I10 Essential (primary) hypertension: Secondary | ICD-10-CM | POA: Diagnosis not present

## 2024-10-11 DIAGNOSIS — Z85118 Personal history of other malignant neoplasm of bronchus and lung: Secondary | ICD-10-CM | POA: Diagnosis not present

## 2024-10-11 DIAGNOSIS — R4181 Age-related cognitive decline: Secondary | ICD-10-CM | POA: Diagnosis not present

## 2024-10-11 DIAGNOSIS — I872 Venous insufficiency (chronic) (peripheral): Secondary | ICD-10-CM | POA: Diagnosis not present

## 2024-10-11 DIAGNOSIS — F331 Major depressive disorder, recurrent, moderate: Secondary | ICD-10-CM | POA: Diagnosis not present

## 2024-10-11 DIAGNOSIS — J449 Chronic obstructive pulmonary disease, unspecified: Secondary | ICD-10-CM | POA: Diagnosis not present

## 2024-10-11 DIAGNOSIS — N1831 Chronic kidney disease, stage 3a: Secondary | ICD-10-CM | POA: Diagnosis not present

## 2024-10-11 DIAGNOSIS — E440 Moderate protein-calorie malnutrition: Secondary | ICD-10-CM | POA: Diagnosis not present

## 2024-10-11 DIAGNOSIS — R413 Other amnesia: Secondary | ICD-10-CM | POA: Diagnosis not present

## 2024-10-11 DIAGNOSIS — R54 Age-related physical debility: Secondary | ICD-10-CM | POA: Diagnosis not present

## 2024-12-08 ENCOUNTER — Emergency Department (HOSPITAL_COMMUNITY)

## 2024-12-08 ENCOUNTER — Other Ambulatory Visit: Payer: Self-pay

## 2024-12-08 ENCOUNTER — Observation Stay (HOSPITAL_COMMUNITY)
Admission: EM | Admit: 2024-12-08 | Source: Home / Self Care | Attending: Internal Medicine | Admitting: Internal Medicine

## 2024-12-08 ENCOUNTER — Encounter (HOSPITAL_COMMUNITY): Payer: Self-pay

## 2024-12-08 ENCOUNTER — Observation Stay (HOSPITAL_COMMUNITY)

## 2024-12-08 DIAGNOSIS — L03115 Cellulitis of right lower limb: Secondary | ICD-10-CM | POA: Diagnosis not present

## 2024-12-08 DIAGNOSIS — L0390 Cellulitis, unspecified: Secondary | ICD-10-CM | POA: Diagnosis present

## 2024-12-08 LAB — CBC WITH DIFFERENTIAL/PLATELET
Abs Immature Granulocytes: 0.04 10*3/uL (ref 0.00–0.07)
Basophils Absolute: 0 10*3/uL (ref 0.0–0.1)
Basophils Relative: 0 %
Eosinophils Absolute: 0 10*3/uL (ref 0.0–0.5)
Eosinophils Relative: 0 %
HCT: 38.1 % (ref 36.0–46.0)
Hemoglobin: 12.1 g/dL (ref 12.0–15.0)
Immature Granulocytes: 0 %
Lymphocytes Relative: 16 %
Lymphs Abs: 1.7 10*3/uL (ref 0.7–4.0)
MCH: 29.3 pg (ref 26.0–34.0)
MCHC: 31.8 g/dL (ref 30.0–36.0)
MCV: 92.3 fL (ref 80.0–100.0)
Monocytes Absolute: 0.5 10*3/uL (ref 0.1–1.0)
Monocytes Relative: 5 %
Neutro Abs: 8.6 10*3/uL — ABNORMAL HIGH (ref 1.7–7.7)
Neutrophils Relative %: 79 %
Platelets: 329 10*3/uL (ref 150–400)
RBC: 4.13 MIL/uL (ref 3.87–5.11)
RDW: 12.9 % (ref 11.5–15.5)
WBC: 10.9 10*3/uL — ABNORMAL HIGH (ref 4.0–10.5)
nRBC: 0 % (ref 0.0–0.2)

## 2024-12-08 LAB — COMPREHENSIVE METABOLIC PANEL WITH GFR
ALT: 12 U/L (ref 0–44)
AST: 32 U/L (ref 15–41)
Albumin: 4.3 g/dL (ref 3.5–5.0)
Alkaline Phosphatase: 80 U/L (ref 38–126)
Anion gap: 11 (ref 5–15)
BUN: 30 mg/dL — ABNORMAL HIGH (ref 8–23)
CO2: 29 mmol/L (ref 22–32)
Calcium: 9.9 mg/dL (ref 8.9–10.3)
Chloride: 100 mmol/L (ref 98–111)
Creatinine, Ser: 0.91 mg/dL (ref 0.44–1.00)
GFR, Estimated: 58 mL/min — ABNORMAL LOW
Glucose, Bld: 106 mg/dL — ABNORMAL HIGH (ref 70–99)
Potassium: 4.2 mmol/L (ref 3.5–5.1)
Sodium: 140 mmol/L (ref 135–145)
Total Bilirubin: 0.6 mg/dL (ref 0.0–1.2)
Total Protein: 6.9 g/dL (ref 6.5–8.1)

## 2024-12-08 LAB — I-STAT CG4 LACTIC ACID, ED: Lactic Acid, Venous: 1.7 mmol/L (ref 0.5–1.9)

## 2024-12-08 MED ORDER — VANCOMYCIN HCL IN DEXTROSE 1-5 GM/200ML-% IV SOLN: 1000.0000 mg | Freq: Once | INTRAVENOUS | Status: DC

## 2024-12-08 MED ORDER — SODIUM CHLORIDE 0.9 % IV SOLN
2.0000 g | Freq: Once | INTRAVENOUS | Status: AC
  Administered 2024-12-08: 2 g via INTRAVENOUS
  Filled 2024-12-08: qty 20

## 2024-12-08 MED ORDER — METOPROLOL SUCCINATE ER 50 MG PO TB24
50.0000 mg | ORAL_TABLET | Freq: Every morning | ORAL | Status: AC
  Administered 2024-12-09 – 2024-12-10 (×2): 50 mg via ORAL
  Filled 2024-12-08 (×2): qty 1

## 2024-12-08 MED ORDER — SODIUM CHLORIDE 0.9 % IV BOLUS
1000.0000 mL | Freq: Once | INTRAVENOUS | Status: AC
  Administered 2024-12-08: 1000 mL via INTRAVENOUS

## 2024-12-08 MED ORDER — VANCOMYCIN HCL 750 MG/150ML IV SOLN
750.0000 mg | INTRAVENOUS | Status: DC
  Administered 2024-12-08: 750 mg via INTRAVENOUS
  Filled 2024-12-08: qty 150

## 2024-12-08 MED ORDER — ONDANSETRON HCL 4 MG/2ML IJ SOLN: 4.0000 mg | Freq: Four times a day (QID) | INTRAMUSCULAR | Status: AC | PRN

## 2024-12-08 MED ORDER — PANTOPRAZOLE SODIUM 40 MG IV SOLR
40.0000 mg | Freq: Two times a day (BID) | INTRAVENOUS | Status: AC
  Administered 2024-12-08 – 2024-12-10 (×5): 40 mg via INTRAVENOUS
  Filled 2024-12-08 (×5): qty 10

## 2024-12-08 MED ORDER — FLUTICASONE FUROATE-VILANTEROL 200-25 MCG/ACT IN AEPB: 1.0000 | INHALATION_SPRAY | Freq: Every day | RESPIRATORY_TRACT | Status: DC

## 2024-12-08 MED ORDER — VANCOMYCIN HCL 1250 MG/250ML IV SOLN
1250.0000 mg | INTRAVENOUS | Status: DC
  Filled 2024-12-08: qty 250

## 2024-12-08 MED ORDER — ONDANSETRON HCL 4 MG PO TABS: 4.0000 mg | ORAL_TABLET | Freq: Four times a day (QID) | ORAL | Status: AC | PRN

## 2024-12-08 MED ORDER — LEVALBUTEROL HCL 0.63 MG/3ML IN NEBU: 0.6300 mg | INHALATION_SOLUTION | Freq: Four times a day (QID) | RESPIRATORY_TRACT | Status: AC | PRN

## 2024-12-08 MED ORDER — SODIUM CHLORIDE 0.9 % IV SOLN: INTRAVENOUS | Status: DC

## 2024-12-08 MED ORDER — SODIUM CHLORIDE 0.9 % IV SOLN: 2.0000 g | Freq: Once | INTRAVENOUS | Status: DC

## 2024-12-08 MED ORDER — IOHEXOL 300 MG/ML  SOLN
100.0000 mL | Freq: Once | INTRAMUSCULAR | Status: AC | PRN
  Administered 2024-12-08: 80 mL via INTRAVENOUS

## 2024-12-08 MED ORDER — SODIUM CHLORIDE 0.9 % IV SOLN
2.0000 g | Freq: Once | INTRAVENOUS | Status: AC
  Administered 2024-12-08: 2 g via INTRAVENOUS
  Filled 2024-12-08: qty 12.5

## 2024-12-08 MED ORDER — BUDESON-GLYCOPYRROL-FORMOTEROL 160-9-4.8 MCG/ACT IN AERO
2.0000 | INHALATION_SPRAY | Freq: Two times a day (BID) | RESPIRATORY_TRACT | Status: AC
  Administered 2024-12-09 – 2024-12-10 (×3): 2 via RESPIRATORY_TRACT
  Filled 2024-12-08: qty 5.9

## 2024-12-08 MED ORDER — SODIUM CHLORIDE 0.9 % IV SOLN
2.0000 g | INTRAVENOUS | Status: DC
  Administered 2024-12-09: 2 g via INTRAVENOUS
  Filled 2024-12-08 (×2): qty 12.5

## 2024-12-08 MED ORDER — VANCOMYCIN HCL IN DEXTROSE 1-5 GM/200ML-% IV SOLN
1000.0000 mg | Freq: Once | INTRAVENOUS | Status: DC
  Filled 2024-12-08: qty 200

## 2024-12-08 NOTE — Progress Notes (Signed)
 Pharmacy Antibiotic Note  [name] [name] is a 89 y.o. female admitted on 12/08/2024 with cellulitis.  Pharmacy has been consulted for Vanco, Cefepime  dosing.  Plan: Cefepime  2g IV q24h Vancomycin  750 mg IV q48h  (SCr 0.91, TBW<IBW, est AUC 460) Measure Vanc levels as needed.  Goal AUC = 400 - 550 Follow up renal function, culture results, and clinical course.     Height: 5' 1 (154.9 cm) Weight: 44 kg (97 lb) IBW/kg (Calculated) : 47.8  Temp (24hrs), Avg:97.9 F (36.6 C), Min:97.3 F (36.3 C), Max:98.5 F (36.9 C)  Recent Labs  Lab 12/08/24 1655 12/08/24 1705  WBC 10.9*  --   CREATININE 0.91  --   LATICACIDVEN  --  1.7    Estimated Creatinine Clearance: 25.7 mL/min (by C-G formula based on SCr of 0.91 mg/dL).    Allergies[1]   Antimicrobials this admission:  2/4 Cefepime  >> 2/4 Vancomycin  >>   Dose adjustments this admission:    Microbiology results:  2/4 BCx:    Wanda Hasting PharmD, BCPS [facility] main pharmacy [phone] 12/08/2024 8:51 PM      [1]  Allergies Allergen Reactions   Codeine Other (See Comments)    GI intolerance and headaches   Penicillin G Benzathine Dermatitis   Aspirin  Other (See Comments)    GI upset in large amounts; sick stomach, in large amounts   Penicillins Rash   Sulfamethoxazole-Trimethoprim Rash and Dermatitis

## 2024-12-08 NOTE — Progress Notes (Signed)
 Pharmacy Antibiotic Note  DEBRA COLON is a 89 y.o. female admitted on 12/08/2024 with cellulitis.  Pharmacy has been consulted for Vanco, Cefepime  dosing.  Active Problem(s): c/o rectal bleeding over several months, not eating and drinking, Right leg cellulitis and hematoma present   ID: Cellulitis Afebrile, WBC 10.9, Scr <1  Vanco 2/4>> Cefepime  2/4>>  Plan: Vancomycin  1250 mg IV Q 48 hrs. Goal AUC 400-550. Expected AUC: 543 SCr used: 0.91 Cefepime  2g IV q24h    Height: 5' 1 (154.9 cm) Weight: 46.7 kg (103 lb) IBW/kg (Calculated) : 47.8  Temp (24hrs), Avg:98.5 F (36.9 C), Min:98.5 F (36.9 C), Max:98.5 F (36.9 C)  Recent Labs  Lab 12/08/24 1655 12/08/24 1705  WBC 10.9*  --   CREATININE 0.91  --   LATICACIDVEN  --  1.7    Estimated Creatinine Clearance: 27.3 mL/min (by C-G formula based on SCr of 0.91 mg/dL).    Allergies[1]  Nevan Creighton Karoline Marina, PharmD, BCPS Clinical Staff Pharmacist   Marina Salines [facility] 12/08/2024 6:38 PM     [1]  Allergies Allergen Reactions   Aspirin  Other (See Comments)      GI upset in large amounts   Codeine Sulfate Other (See Comments)     GI upset, headache   Penicillins Rash    Other Reaction(s): Not available   Septra Ds [Sulfamethoxazole-Trimethoprim] Rash

## 2024-12-08 NOTE — ED Provider Notes (Signed)
 " [place] EMERGENCY DEPARTMENT AT [facility] Provider Note   CSN: [id number] Arrival date & time: 12/08/24  1610     Patient presents with: Rectal Bleeding   [name] [name] is a 89 y.o. female.   89 yo F with multiple complaints.  Here I think primarily for right leg redness.  Spreading up the leg.  Quite tender.  Patient gives limited history.  Level 5 caveat.   Rectal Bleeding      Prior to Admission medications  Medication Sig Start Date End Date Taking? Authorizing Provider  acetaminophen  (TYLENOL ) 500 MG tablet Take 500 mg by mouth every 6 (six) hours as needed for mild pain.     [provider]  albuterol  (PROVENTIL  HFA) 108 (90 Base) MCG/ACT inhaler USE 1 TO 2 INHALATIONS     ORALLY EVERY 6 HOURS AS    NEEDED FOR WHEEZING OR     SHORTNESS OF BREATH 12/04/16   Young, Clinton D, MD  amLODipine (NORVASC) 2.5 MG tablet Take 2.5 mg by mouth daily.  04/04/15   [provider]  CALCIUM-VITAMIN D PO Take 1 capsule by mouth daily.     [provider]  diazepam  (VALIUM ) 5 MG tablet Take 2.5 mg by mouth at bedtime as needed for anxiety.    [provider]  diclofenac sodium (VOLTAREN) 1 % GEL Apply 1 application topically 3 (three) times daily as needed (pain).     [provider]  erythromycin ophthalmic ointment APPLY A SMALL RIBBON OF OINTMENT INTO RIGHT ONCE AT BEDTIME 05/10/19   [provider]  Fluticasone -Salmeterol (ADVAIR DISKUS) 250-50 MCG/DOSE AEPB USE 1 INHALATION ORALLY    INTO THE LUNGS TWO TIMES A DAY 08/26/18   Young, Clinton D, MD  levalbuterol  (XOPENEX ) 0.63 MG/3ML nebulizer solution Take 3 mLs (0.63 mg total) by nebulization every 6 (six) hours as needed for wheezing or shortness of breath. 06/14/14   Floretta Bethanne CROME, MD  metoprolol  (TOPROL -XL) 50 MG 24 hr tablet Take 50 mg by mouth every morning.     [provider]  metroNIDAZOLE (METROGEL) 1 % gel Apply 1 application topically at bedtime as  needed.    [provider]  mometasone  (NASONEX ) 50 MCG/ACT nasal spray Place 2 sprays into the nose daily. 12/04/16   Neysa Reggy BIRCH, MD  Omega-3 Fatty Acids (FISH OIL) 1200 MG CAPS Take 1 capsule by mouth daily.    [provider]  RESTASIS 0.05 % ophthalmic emulsion INSTILL 1 DROP INTO BOTH EYES EVERY 12 HOURS 10/01/17   [provider]  traMADol  (ULTRAM ) 50 MG tablet Take 50 mg by mouth 2 (two) times daily as needed. 03/10/22   [provider]  TRELEGY ELLIPTA 100-62.5-25 MCG/ACT AEPB Inhale 1 puff into the lungs daily. 07/04/22   [provider]  Vitamin D, Ergocalciferol, (DRISDOL) 50000 UNITS CAPS capsule Take 50,000 Units by mouth every 30 (thirty) days.    [provider]    Allergies: Aspirin , Codeine sulfate, Penicillins, and Septra ds [sulfamethoxazole-trimethoprim]    Review of Systems  Gastrointestinal:  Positive for hematochezia.    Updated Vital Signs BP (!) 158/82   Pulse 80   Temp 98.5 F (36.9 C)   Resp 17   Ht 5' 1 (1.549 m)   Wt 46.7 kg   SpO2 96%   BMI 19.46 kg/m   Physical Exam Vitals and nursing note reviewed.  Constitutional:      General: She is not in acute distress.  Appearance: She is well-developed. She is not diaphoretic.  HENT:     Head: Normocephalic and atraumatic.  Eyes:     Pupils: Pupils are equal, round, and reactive to light.  Cardiovascular:     Rate and Rhythm: Normal rate and regular rhythm.     Heart sounds: No murmur heard.    No friction rub. No gallop.  Pulmonary:     Effort: Pulmonary effort is normal.     Breath sounds: No wheezing or rales.  Abdominal:     General: There is no distension.     Palpations: Abdomen is soft.     Tenderness: There is no abdominal tenderness.  Genitourinary:    Comments: Soft brown stool.  No hemorrhoids. Musculoskeletal:        General: No tenderness.     Cervical back: Normal range of motion and neck supple.  Skin:    General: Skin  is warm and dry.     Comments: Erythema and swelling to the right lower leg extends up to the thigh.  Neurological:     Mental Status: She is alert and oriented to person, place, and time.  Psychiatric:        Behavior: Behavior normal.     (all labs ordered are listed, but only abnormal results are displayed) Labs Reviewed  CBC WITH DIFFERENTIAL/PLATELET - Abnormal; Notable for the following components:      Result Value   WBC 10.9 (*)    Neutro Abs 8.6 (*)    All other components within normal limits  COMPREHENSIVE METABOLIC PANEL WITH GFR - Abnormal; Notable for the following components:   Glucose, Bld 106 (*)    BUN 30 (*)    GFR, Estimated 58 (*)    All other components within normal limits  CULTURE, BLOOD (ROUTINE X 2)  CULTURE, BLOOD (ROUTINE X 2)  I-STAT CG4 LACTIC ACID, ED    EKG: None  Radiology: CT ABDOMEN PELVIS W CONTRAST Result Date: 12/08/2024 EXAM: CT ABDOMEN AND PELVIS WITH CONTRAST 12/08/2024 06:55:32 PM TECHNIQUE: CT of the abdomen and pelvis was performed with the administration of 80 mL of iohexol  (OMNIPAQUE ) 300 MG/ML solution. Multiplanar reformatted images are provided for review. Automated exposure control, iterative reconstruction, and/or weight-based adjustment of the mA/kV was utilized to reduce the radiation dose to as low as reasonably achievable. COMPARISON: Comparison with 03/31/2008. CLINICAL HISTORY: Lower GI bleed. FINDINGS: LOWER CHEST: Aortic aneurysm of the distal thoracic aorta measuring 3.9 cm. LIVER: Hypoattenuating lesions in the liver compatible with cysts. GALLBLADDER AND BILE DUCTS: Status post cholecystectomy. Intra- and extrahepatic biliary dilatation, presumably due to reservoir effect post cholecystectomy. SPLEEN: No acute abnormality. PANCREAS: No acute abnormality. ADRENAL GLANDS: No acute abnormality. KIDNEYS, URETERS AND BLADDER: No stones in the kidneys or ureters. No hydronephrosis. No perinephric or periureteral stranding. Urinary  bladder is unremarkable. GI AND BOWEL: Stomach demonstrates no acute abnormality. Heterogeneous enhancement and soft tissue thickening about the rectum concerning for rectal mass (series 12 image 62). Assessment of active bleeding is limited on single phase exam. There is no bowel obstruction. PERITONEUM AND RETROPERITONEUM: Small volume free fluid in the pelvis. No free air. VASCULATURE: Aorta is normal in caliber. LYMPH NODES: No lymphadenopathy. REPRODUCTIVE ORGANS: Status post hysterectomy. BONES AND SOFT TISSUES: Scoliosis with a scoliotic lumbar curve. Posterior fusion L4-L5. Right hip arthroplasty. Remote fractures of the right pubic rami. No acute osseous abnormality. No focal soft tissue abnormality. IMPRESSION: 1. Heterogeneous enhancement and soft tissue thickening about the rectum concerning  for rectal mass. 2. Aortic aneurysm of the distal thoracic aorta measuring 3.9 cm. Recommend annual imaging followup by CTA or MRA. This recommendation follows 2010 ACCF/AHA/AATS/ACR/ASA/SCA/SCAI/SIR/STS/SVM Guidelines for the Diagnosis and Management of Patients with Thoracic Aortic Disease. Circulation. 2010; 121: Z733-z630. Aortic aneurysm NOS (ICD10-I71.9) Electronically signed by: Norman Gatlin MD 12/08/2024 07:06 PM EST RP Workstation: [id number]   DG Ankle Complete Right Result Date: 12/08/2024 CLINICAL DATA:  right ankle pain EXAM: RIGHT ANKLE - COMPLETE 3+ VIEW COMPARISON:  None Available. FINDINGS: Bones are osteopenic. No acute osseous finding, fracture, or malalignment. No significant joint abnormality or arthropathy. Lower extremity subcutaneous edema noted. IMPRESSION: 1. Osteopenia. No acute osseous finding. 2. Lower extremity subcutaneous edema. Electronically Signed   By: CHRISTELLA.  Shick M.D.   On: 12/08/2024 17:26     Procedures   Medications Ordered in the ED  pantoprazole  (PROTONIX ) injection 40 mg (has no administration in time range)  vancomycin  (VANCOREADY) IVPB 1250 mg/250 mL (has no  administration in time range)  ceFEPIme  (MAXIPIME ) 2 g in sodium chloride  0.9 % 100 mL IVPB (has no administration in time range)  sodium chloride  0.9 % bolus 1,000 mL (0 mLs Intravenous Stopped 12/08/24 1907)  cefTRIAXone  (ROCEPHIN ) 2 g in sodium chloride  0.9 % 100 mL IVPB (0 g Intravenous Stopped 12/08/24 1723)  ceFEPIme  (MAXIPIME ) 2 g in sodium chloride  0.9 % 100 mL IVPB (2 g Intravenous New Bag/Given 12/08/24 1902)  iohexol  (OMNIPAQUE ) 300 MG/ML solution 100 mL (80 mLs Intravenous Contrast Given 12/08/24 1838)                                    Medical Decision Making Amount and/or Complexity of Data Reviewed Labs: ordered. Radiology: ordered.  Risk Prescription drug management. Decision regarding hospitalization.   89 yo F here with perhaps multiple complaints.  She was initially entered into the computer as rectal bleeding.  Was listed as going on for months.  She also was concerned about what looks like cellulitis to the right lower extremity.  She does have some lymphatic spread.  Will start on IV antibiotics.  Blood work.  Rectal exam here without obvious acute bleeding.  Patients daughter in law here.  She provides further history.  States that she came to visit earlier in the week and found out that the patient had been having bright red blood per rectum.  Had had dark stools at times as well.  The patient told her that is been going on for a couple months.  She stopped eating and drinking to try and stop having bowel movements.  She was able to convince the patient to continue to eat and drink.  Had a bowel movement yesterday reportedly also was bloody.  They tried to schedule an appointment today with their primary care provider who told him they should come here instead.  She has been having some delusions but has been going on for a while now.  She does live at home by herself.  Hemoglobin here is 12.  BUN is elevated though could be due to dehydration and side of upper GI  bleeding.  Discussed with hospitalist for admission.  Secure chat message was sent to Dr. Kriss Ee gastroenterology.  Dr. Rosalie requesting CT abdomen pelvis.  The patients results and plan were reviewed and discussed.   Any x-rays performed were independently reviewed by myself.   Differential diagnosis were considered with the presenting HPI.  Medications  pantoprazole  (PROTONIX ) injection 40 mg (has no administration in time range)  vancomycin  (VANCOREADY) IVPB 1250 mg/250 mL (has no administration in time range)  ceFEPIme  (MAXIPIME ) 2 g in sodium chloride  0.9 % 100 mL IVPB (has no administration in time range)  sodium chloride  0.9 % bolus 1,000 mL (0 mLs Intravenous Stopped 12/08/24 1907)  cefTRIAXone  (ROCEPHIN ) 2 g in sodium chloride  0.9 % 100 mL IVPB (0 g Intravenous Stopped 12/08/24 1723)  ceFEPIme  (MAXIPIME ) 2 g in sodium chloride  0.9 % 100 mL IVPB (2 g Intravenous New Bag/Given 12/08/24 1902)  iohexol  (OMNIPAQUE ) 300 MG/ML solution 100 mL (80 mLs Intravenous Contrast Given 12/08/24 1838)    Vitals:   12/08/24 1623 12/08/24 1832  BP: (!) 158/82   Pulse: 80   Resp: 17   Temp: 98.5 F (36.9 C)   SpO2: 96%   Weight:  46.7 kg  Height:  5' 1 (1.549 m)    Final diagnoses:  Cellulitis of right lower extremity    Admission/ observation were discussed with the admitting physician, patient and/or family and they are comfortable with the plan.       Final diagnoses:  Cellulitis of right lower extremity    ED Discharge Orders     None          Emil Share, DO 12/08/24 1945  "

## 2024-12-08 NOTE — ED Triage Notes (Addendum)
 Pt BIBA from home, c/o rectal bleeding over several months now. Per daughter sometimes has black stool sometimes red.  Per daughter pt has not been eating and drinking well. Per pt she last ate today. Right leg cellulitis and hematoma present. Mental status at baseline. Hx of cellulitis, gi bleed, early dementia.   BP 160/90 HR 74 RR 16 CBG 125

## 2024-12-08 NOTE — H&P (Signed)
 " History and Physical    [name] [name] FMW:[id number] DOB: 05-21-1927 DOA: 12/08/2024  I have briefly reviewed the patient's prior medical records in [facility]  PCP: Ransom Other, MD  Patient coming from: home  Chief Complaint: rectal bleeding, RLE cellulitis  HPI: SHANEQUA WHITENIGHT is a 89 y.o. female with medical history significant of essential hypertension, COPD on home oxygen  (but not wearing), mild cognitive impairment, anxiety, who comes into the hospital with multiple complaints.  Patient is a very poor historian and slightly confused, and history as per daughter-in-law (who is POA) who is at bedside.  For once, patient has been having black stools for several months, but has not told any family members until recently.  Daughter-in-law also saw fresh blood on her stool yesterday, moderate amount.  She has also been having bilateral lower extremity wounds cared for as an outpatient, however on the right lower extremity, in the past couple days the daughter-in-law noticed significant redness which seems to be extending rapidly above the wound.  In addition, patient has been having increased confusion, poor p.o. intake and generalized weakness to the point that she can barely ambulate.  Patient tells me she is short of breath, but this is chronic.  No chest pain.  She denies abdominal pain, but earlier today had an episode of vomiting.  She has been having no fever or chills  ED Course: In the ED she is afebrile, blood pressure on the higher side in the 150s, has been placed on 2 L and satting well.  Blood work reveals a hemoglobin of 12.1, white count of 10.9.  Has a BUN of 30 and a creatinine of 0.9.  EDP message Dr. Kriss with gastroenterology for nonurgent consult, patient received antibiotics for right lower extremity cellulitis and we have been asked to admit  Review of Systems: All systems reviewed, and apart from HPI, all negative  Past Medical History:  Diagnosis Date    Allergic rhinitis, cause unspecified    Angioneurotic edema not elsewhere classified    Arthritis    Cancer (HCC) 2006   lung ca   Chronic obstructive asthma, unspecified    Collapsed lung 1959   hx of with chest tube insertion   Complication of anesthesia    severe headache x1   Depression    Environmental allergies    Headache(784.0)    Malignant neoplasm of bronchus and lung, unspecified site    UTI (urinary tract infection)     Past Surgical History:  Procedure Laterality Date   ABDOMINAL HYSTERECTOMY     CHOLECYSTECTOMY  1969   CLOSED MANIPULATION SHOULDER Right 1990's   frozen shoulder   HIP ARTHROPLASTY Right 06/11/2014   Procedure: RIGHT HIP HEMI ARTHROPLASTY;  Surgeon: Dempsey Melodi GAILS, MD;  Location: [facility];  Service: Orthopedics;  Laterality: Right;   right upper lobectomy  2006   TOTAL KNEE ARTHROPLASTY Left 03/21/2014   Procedure: LEFT TOTAL KNEE ARTHROPLASTY;  Surgeon: Dempsey GAILS Melodi, MD;  Location: [facility];  Service: Orthopedics;  Laterality: Left;     reports that she quit smoking about 31 years ago. Her smoking use included cigarettes. She started smoking about 81 years ago. She has a 62.5 pack-year smoking history. She has never used smokeless tobacco. She reports that she does not drink alcohol and does not use drugs.  Allergies[1]  Family History  Problem Relation Age of Onset   Asthma Unknown    Alzheimer's disease Mother    Stroke Mother  Prior to Admission medications  Medication Sig Start Date End Date Taking? Authorizing Provider  acetaminophen  (TYLENOL ) 500 MG tablet Take 500 mg by mouth every 6 (six) hours as needed for mild pain.     [provider]  albuterol  (PROVENTIL  HFA) 108 (90 Base) MCG/ACT inhaler USE 1 TO 2 INHALATIONS     ORALLY EVERY 6 HOURS AS    NEEDED FOR WHEEZING OR     SHORTNESS OF BREATH 12/04/16   Young, Clinton D, MD  amLODipine (NORVASC) 2.5 MG tablet Take 2.5 mg by mouth daily.  04/04/15   [provider]   CALCIUM-VITAMIN D PO Take 1 capsule by mouth daily.     [provider]  diazepam  (VALIUM ) 5 MG tablet Take 2.5 mg by mouth at bedtime as needed for anxiety.    [provider]  diclofenac sodium (VOLTAREN) 1 % GEL Apply 1 application topically 3 (three) times daily as needed (pain).     [provider]  erythromycin ophthalmic ointment APPLY A SMALL RIBBON OF OINTMENT INTO RIGHT ONCE AT BEDTIME 05/10/19   [provider]  Fluticasone -Salmeterol (ADVAIR DISKUS) 250-50 MCG/DOSE AEPB USE 1 INHALATION ORALLY    INTO THE LUNGS TWO TIMES A DAY 08/26/18   Young, Clinton D, MD  levalbuterol  (XOPENEX ) 0.63 MG/3ML nebulizer solution Take 3 mLs (0.63 mg total) by nebulization every 6 (six) hours as needed for wheezing or shortness of breath. 06/14/14   Floretta Bethanne CROME, MD  metoprolol  (TOPROL -XL) 50 MG 24 hr tablet Take 50 mg by mouth every morning.     [provider]  metroNIDAZOLE (METROGEL) 1 % gel Apply 1 application topically at bedtime as needed.    [provider]  mometasone  (NASONEX ) 50 MCG/ACT nasal spray Place 2 sprays into the nose daily. 12/04/16   Neysa Reggy BIRCH, MD  Omega-3 Fatty Acids (FISH OIL) 1200 MG CAPS Take 1 capsule by mouth daily.    [provider]  RESTASIS 0.05 % ophthalmic emulsion INSTILL 1 DROP INTO BOTH EYES EVERY 12 HOURS 10/01/17   [provider]  traMADol  (ULTRAM ) 50 MG tablet Take 50 mg by mouth 2 (two) times daily as needed. 03/10/22   [provider]  TRELEGY ELLIPTA 100-62.5-25 MCG/ACT AEPB Inhale 1 puff into the lungs daily. 07/04/22   [provider]  Vitamin D, Ergocalciferol, (DRISDOL) 50000 UNITS CAPS capsule Take 50,000 Units by mouth every 30 (thirty) days.    [provider]    Physical Exam: Vitals:   12/08/24 1623  BP: (!) 158/82  Pulse: 80  Resp: 17  Temp: 98.5 F (36.9 C)  SpO2: 96%    Constitutional: Frail-appearing female, no distress Eyes: PERRL,  lids and conjunctivae normal ENMT: Mucous membranes are dry Neck: normal, supple Respiratory: clear to auscultation bilaterally, no wheezing, no crackles. Normal respiratory effort.   Cardiovascular: Regular rate and rhythm, no murmurs / rubs / gallops. No extremity edema. 2+ pedal pulses.  Abdomen: no tenderness, no masses palpated. Bowel sounds positive.  Musculoskeletal: no clubbing / cyanosis. Normal muscle tone.  Skin: Cellulitic rash as below, erythematous streaks extending upwards on the medial and lateral side until just below the knee Neurologic: Nonfocal         Labs on Admission: I have personally reviewed following labs and imaging studies  CBC: Recent Labs  Lab 12/08/24 1655  WBC 10.9*  NEUTROABS 8.6*  HGB 12.1  HCT 38.1  MCV 92.3  PLT 329   Basic Metabolic Panel:  Recent Labs  Lab 12/08/24 1655  NA 140  K 4.2  CL 100  CO2 29  GLUCOSE 106*  BUN 30*  CREATININE 0.91  CALCIUM 9.9   Liver Function Tests: Recent Labs  Lab 12/08/24 1655  AST 32  ALT 12  ALKPHOS 80  BILITOT 0.6  PROT 6.9  ALBUMIN 4.3   Coagulation Profile: No results for input(s): INR, PROTIME in the last 168 hours. BNP (last 3 results) No results for input(s): PROBNP in the last 8760 hours. CBG: No results for input(s): GLUCAP in the last 168 hours. Thyroid  Function Tests: No results for input(s): TSH, T4TOTAL, FREET4, T3FREE, THYROIDAB in the last 72 hours. Urine analysis:    Component Value Date/Time   COLORURINE YELLOW 06/10/2014 1302   APPEARANCEUR CLEAR 06/10/2014 1302   LABSPEC 1.018 06/10/2014 1302   PHURINE 6.0 06/10/2014 1302   GLUCOSEU NEGATIVE 06/10/2014 1302   HGBUR NEGATIVE 06/10/2014 1302   BILIRUBINUR NEGATIVE 06/10/2014 1302   KETONESUR NEGATIVE 06/10/2014 1302   PROTEINUR NEGATIVE 06/10/2014 1302   UROBILINOGEN 0.2 06/10/2014 1302   NITRITE NEGATIVE 06/10/2014 1302   LEUKOCYTESUR SMALL (A) 06/10/2014 1302     Radiological Exams on  Admission: DG Ankle Complete Right Result Date: 12/08/2024 CLINICAL DATA:  right ankle pain EXAM: RIGHT ANKLE - COMPLETE 3+ VIEW COMPARISON:  None Available. FINDINGS: Bones are osteopenic. No acute osseous finding, fracture, or malalignment. No significant joint abnormality or arthropathy. Lower extremity subcutaneous edema noted. IMPRESSION: 1. Osteopenia. No acute osseous finding. 2. Lower extremity subcutaneous edema. Electronically Signed   By: CHRISTELLA.  Shick M.D.   On: 12/08/2024 17:26    EKG: Independently reviewed.  Sinus rhythm on the monitor  Assessment/Plan Principal problem Right lower extremity cellulitis -patient with history of chronic wounds to bilateral lower extremities, left lower extremity wound appears stable.  On the right lower extremity there appears to be erythema extending on the medial and lateral aspects of the wound up until just below the knee - She has mild leukocytosis, lactic acid is normal - Started IV antibiotics, monitor rash - Wound care consulted for bilateral lower extremities  Active problems Possible upper / lower GI bleed -this is likely subacute, hemoglobin is stable.  Per report this mostly melena however there have been instances with bright red blood.  Gastroenterology consulted by EDP, appreciate input.  Doubt she is a candidate for colonoscopy at this point  Essential hypertension -resume home metoprolol , she is hypertensive in the ED  COPD with chronic hypoxic respiratory failure -no wheezing, this appears stable.  She is on oxygen  at home but not wearing it normally.  Resume inhalers  Anxiety-appears to be on Valium , however med rec not done, resume once completed  History of tobacco use, in remission -noted  Poor p.o. intake, FTT -she appears to be quite weak.  Provide IV fluids overnight, allow a diet.  PT/OT consultation  DVT prophylaxis: SCDs Code Status: DNR/DNI per POA Family Communication: DIL at bedside who is the POA Bed Type:  Telemetry Consults called: GI per EDP Dr. Kriss Obs/Inp: Observation    Nilda Fendt, MD, PhD [facility]  Contact via [email or web address]  12/08/2024, 6:20 PM         [1]  Allergies Allergen Reactions   Aspirin  Other (See Comments)      GI upset in large amounts   Codeine Sulfate Other (See Comments)     GI upset, headache   Penicillins Rash    Other Reaction(s): Not available  Septra Ds [Sulfamethoxazole-Trimethoprim] Rash   "

## 2024-12-09 ENCOUNTER — Encounter (HOSPITAL_COMMUNITY): Payer: Self-pay | Admitting: Internal Medicine

## 2024-12-09 LAB — COMPREHENSIVE METABOLIC PANEL WITH GFR
ALT: 9 U/L (ref 0–44)
AST: 22 U/L (ref 15–41)
Albumin: 3.4 g/dL — ABNORMAL LOW (ref 3.5–5.0)
Alkaline Phosphatase: 61 U/L (ref 38–126)
Anion gap: 8 (ref 5–15)
BUN: 22 mg/dL (ref 8–23)
CO2: 26 mmol/L (ref 22–32)
Calcium: 8.5 mg/dL — ABNORMAL LOW (ref 8.9–10.3)
Chloride: 106 mmol/L (ref 98–111)
Creatinine, Ser: 0.76 mg/dL (ref 0.44–1.00)
GFR, Estimated: >60 mL/min
Glucose, Bld: 115 mg/dL — ABNORMAL HIGH (ref 70–99)
Potassium: 3.8 mmol/L (ref 3.5–5.1)
Sodium: 141 mmol/L (ref 135–145)
Total Bilirubin: 0.5 mg/dL (ref 0.0–1.2)
Total Protein: 5.1 g/dL — ABNORMAL LOW (ref 6.5–8.1)

## 2024-12-09 LAB — MAGNESIUM: Magnesium: 1.9 mg/dL (ref 1.7–2.4)

## 2024-12-09 LAB — CBC
HCT: 33.9 % — ABNORMAL LOW (ref 36.0–46.0)
Hemoglobin: 10.8 g/dL — ABNORMAL LOW (ref 12.0–15.0)
MCH: 29.1 pg (ref 26.0–34.0)
MCHC: 31.9 g/dL (ref 30.0–36.0)
MCV: 91.4 fL (ref 80.0–100.0)
Platelets: 263 10*3/uL (ref 150–400)
RBC: 3.71 MIL/uL — ABNORMAL LOW (ref 3.87–5.11)
RDW: 13 % (ref 11.5–15.5)
WBC: 6.8 10*3/uL (ref 4.0–10.5)
nRBC: 0 % (ref 0.0–0.2)

## 2024-12-09 LAB — PHOSPHORUS: Phosphorus: 3.1 mg/dL (ref 2.5–4.6)

## 2024-12-09 MED ORDER — VANCOMYCIN HCL 750 MG/150ML IV SOLN
750.0000 mg | INTRAVENOUS | Status: DC
  Administered 2024-12-10: 750 mg via INTRAVENOUS
  Filled 2024-12-09: qty 150

## 2024-12-09 MED ORDER — OLANZAPINE 2.5 MG PO TABS
2.5000 mg | ORAL_TABLET | Freq: Every day | ORAL | Status: AC
  Administered 2024-12-09 – 2024-12-10 (×2): 2.5 mg via ORAL
  Filled 2024-12-09 (×2): qty 1

## 2024-12-09 NOTE — Progress Notes (Signed)
 Pharmacy Antibiotic Note  [name] [name] is a 89 y.o. female admitted on 12/08/2024 with cellulitis.  Pharmacy has been consulted for Vanco, Cefepime  dosing.  Today, 12/09/24 SCr down to 0.76 and estimated AUC for vancomycin  recalculated using SCr value rounded up to 0.8.  Plan: Continue Cefepime  2g IV q24h Change Vancomycin  to 750 mg IV q36h  (SCr 0.8, TBW<IBW, est AUC 550.9) Measure Vanc levels as needed.  Goal AUC = 400 - 600 Follow up renal function, culture results, and clinical course.     Height: 5' 1 (154.9 cm) Weight: 44 kg (97 lb) IBW/kg (Calculated) : 47.8  Temp (24hrs), Avg:97.9 F (36.6 C), Min:97.3 F (36.3 C), Max:98.5 F (36.9 C)  Recent Labs  Lab 12/08/24 1655 12/08/24 1705 12/09/24 0334  WBC 10.9*  --  6.8  CREATININE 0.91  --  0.76  LATICACIDVEN  --  1.7  --     Estimated Creatinine Clearance: 29.2 mL/min (by C-G formula based on SCr of 0.76 mg/dL).    Allergies[1]   Antimicrobials this admission:  2/4 Cefepime  >> 2/4 Vancomycin  >>   Microbiology results:  2/4 BCx: ng < 12 hrs   Thank you for allowing pharmacy to be a part of this patients care.  Eleanor EMERSON Agent, PharmD, BCPS Clinical Pharmacist [place] 12/09/2024 9:43 AM        [1]  Allergies Allergen Reactions   Codeine Other (See Comments)    GI intolerance and headaches   Penicillin G Benzathine Dermatitis   Aspirin  Other (See Comments)    GI upset in large amounts; sick stomach, in large amounts   Penicillins Rash   Sulfamethoxazole-Trimethoprim Rash and Dermatitis

## 2024-12-09 NOTE — Evaluation (Signed)
 Physical Therapy Evaluation Patient Details Name: [name] [name] MRN: [id number] DOB: [birth year] Today's Date: 12/09/2024  History of Present Illness  Pt is a 89 y.o. female admitted with RLE cellulitis/wounds, potential GI bleed with blood in stool, FTT. PMH significant for HTN, COPD with chronic hypoxic respiratory failure, lung ca, MCI, anxiety.  Clinical Impression   Pt admitted as above. Pt poor historian, unable to provide accurate PLOF or home setup. Per chart review, pt lives alone. Pt received in bed, anxious + restless, pulled out LUE IV. Redirectable with reassurance and gentle re-orientation. Pt able to roll R<>L for pericare, bathing and gown change bed level, MIN A for bed mobility and able to ambulate in hallway with MIN A using RW. Pt presents to acute PT with deficits in strength, activity tolerance, balance, cognition and mobility. Pt would benefit from skilled PT services to address noted impairments and functional limitations (see below for any additional details) in order to maximize safety and independence while minimizing falls risk and caregiver burden. Anticipate the need for follow up PT services upon acute hospital DC. If pt has 24/7 supervision and assist from family, may discharge with [facility] services. If family cannot provide necessary level of assist, consider continued inpatient follow up therapy, <3 hours/day.        If plan is discharge home, recommend the following: A little help with walking and/or transfers;A little help with bathing/dressing/bathroom;Assistance with cooking/housework;Assist for transportation;Help with stairs or ramp for entrance   Can travel by private vehicle        Equipment Recommendations None recommended by PT  Recommendations for Other Services       Functional Status Assessment Patient has had a recent decline in their functional status and demonstrates the ability to make significant improvements in function in a reasonable and  predictable amount of time.     Precautions / Restrictions Precautions Precautions: Fall Restrictions Weight Bearing Restrictions Per Provider Order: No      Mobility  Bed Mobility Overal bed mobility: Needs Assistance Bed Mobility: Rolling, Supine to Sit Rolling: Contact guard assist   Supine to sit: Min assist     General bed mobility comments: able to roll R<>L in bed with CGA, follows commands. MIN A to transition from supine to sitting with assist for trunk    Transfers Overall transfer level: Needs assistance Equipment used: Rolling walker (2 wheels) Transfers: Sit to/from Stand Sit to Stand: Min assist           General transfer comment: Steady assist with cues for use of UEs to self assist    Ambulation/Gait Ambulation/Gait assistance: Min assist Gait Distance (Feet): 95 Feet Assistive device: Rolling walker (2 wheels) Gait Pattern/deviations: Step-to pattern, Step-through pattern, Decreased step length - right, Decreased step length - left, Shuffle, Trunk flexed Gait velocity: decr     General Gait Details: Steady assist with cues for posture and position from [employer]            Wheelchair Mobility     Tilt Bed    Modified Rankin (Stroke Patients Only)       Balance Overall balance assessment: Needs assistance Sitting-balance support: Feet supported, Bilateral upper extremity supported Sitting balance-Leahy Scale: Fair     Standing balance support: Bilateral upper extremity supported, During functional activity Standing balance-Leahy Scale: Fair Standing balance comment: reliant on external support  Pertinent Vitals/Pain Pain Assessment Pain Assessment: Faces Faces Pain Scale: Hurts whole lot Pain Location: BLE while in bed at rest Pain Descriptors / Indicators: Discomfort, Grimacing, Guarding, Moaning Pain Intervention(s): Limited activity within patient's tolerance, Monitored during  session, Repositioned    Home Living Family/patient expects to be discharged to:: Private residence Living Arrangements: Alone               Home Equipment: Rollator (4 wheels) Additional Comments: per chart, pt lives alone. pt poor historian and cannot provide additional information.    Prior Function Prior Level of Function : Patient poor historian/Family not available             Mobility Comments: 4WW       Extremity/Trunk Assessment   Upper Extremity Assessment Upper Extremity Assessment: Generalized weakness    Lower Extremity Assessment Lower Extremity Assessment: Generalized weakness       Communication   Communication Communication: Impaired Factors Affecting Communication: Hearing impaired    Cognition Arousal: Alert Behavior During Therapy: Restless, WFL for tasks assessed/performed                             Following commands: Intact       Cueing Cueing Techniques: Verbal cues, Gestural cues, Tactile cues     General Comments General comments (skin integrity, edema, etc.): HR 80s-100 bpm with mobility, unable to obtain accurate pleth with cold fingers. Alerted RN to pt pulling out IV L arm start of session (no active bleeding)    Exercises     Assessment/Plan    PT Assessment Patient needs continued PT services  PT Problem List Decreased strength;Decreased activity tolerance;Decreased balance;Decreased mobility;Decreased cognition;Decreased knowledge of use of DME;Pain       PT Treatment Interventions DME instruction;Gait training;Stair training;Functional mobility training;Therapeutic activities;Therapeutic exercise;Balance training;Patient/family education    PT Goals (Current goals can be found in the Care Plan section)  Acute Rehab PT Goals Patient Stated Goal: home PT Goal Formulation: With patient Time For Goal Achievement: 12/23/24 Potential to Achieve Goals: Good    Frequency Min 3X/week      Co-evaluation PT/OT/SLP Co-Evaluation/Treatment: Yes Reason for Co-Treatment: Complexity of the patient's impairments (multi-system involvement);For patient/therapist safety;To address functional/ADL transfers PT goals addressed during session: Mobility/safety with mobility;Balance OT goals addressed during session: ADL's and self-care       AM-PAC PT 6 Clicks Mobility  Outcome Measure Help needed turning from your back to your side while in a flat bed without using bedrails?: A Little Help needed moving from lying on your back to sitting on the side of a flat bed without using bedrails?: A Little Help needed moving to and from a bed to a chair (including a wheelchair)?: A Little Help needed standing up from a chair using your arms (e.g., wheelchair or bedside chair)?: A Little Help needed to walk in hospital room?: A Little Help needed climbing 3-5 steps with a railing? : A Lot 6 Click Score: 17    End of Session Equipment Utilized During Treatment: Gait belt Activity Tolerance: Patient tolerated treatment well;Patient limited by fatigue Patient left: in chair;with call bell/phone within reach;with chair alarm set Nurse Communication: Mobility status PT Visit Diagnosis: Difficulty in walking, not elsewhere classified (R26.2);Muscle weakness (generalized) (M62.81);Pain Pain - part of body: Leg    Time: 9058-8993 PT Time Calculation (min) (ACUTE ONLY): 25 min   Charges:   PT Evaluation $PT Eval Low Complexity: 1 Low  PT General Charges $$ ACUTE PT VISIT: 1 Visit         [facility] PT Acute Rehabilitation Services Office [phone]   Waylyn Tenbrink 12/09/2024, 1:52 PM

## 2024-12-09 NOTE — Progress Notes (Signed)
 Patient has recent mobility issues and has urinary urgency. Messaged Lavanda Horns to request purewick order.

## 2024-12-09 NOTE — Consult Note (Signed)
" °  CLINICAL SUPPORT TEAM - WOUND OSTOMY AND CONTINENCE TEAM  CONSULTATION SERVICES   WOC Nurse-Inpatient Note    WOC Nurse Consult Note:  WOC consult performed remotely utilizing imaging and chart review Reason for Consult: bilateral lower extremity wounds Wound type: full thickness wounds with surrounding chronic skin changes consistent with peripheral vascular disease.  Chart review indicates patient seen by orthopedics for concerns of LE wound in November 2025 at which time she was advised to follow up with wound clinic, however no notes located in medical record to support this.   Pressure Injury POA: NA- not pressure Measurement: see nursing flow sheets Wound bed: 100% dry, scabbed appearance Drainage (amount, consistency, odor) see nursing flow sheets Periwound: chronic skin changes consistent with peripheral vascular disease Dressing procedure/placement/frequency:   Cleanse LE wounds with Vashe (WD #[id number]) allow to air dry.  Place Xeroform over wound bed and cover with silicone foam dressing.  Wrap with Kerlix and Ace wrap in spiral fashion beginning at base of toes and extending to back of knee. Change daily        WOC team will not follow patient at this time, please re consult if new needs arise.  Thank you,  Doyal Polite, MSN, RN, [facility] WOC Team [phone] (Available Mon-Fri 0700-1500)      "

## 2024-12-09 NOTE — Consult Note (Signed)
 Reason for Consult: Bright red blood per rectum melena abnormal CT Referring Physician: Hospital team  [name] [name] is an 89 y.o. female.  HPI: Patient seen and examined and her hospital computer chart reviewed and her case discussed with her power of attorney her daughter-in-law Adalia Pettis and she does periodically see both bright red blood and some dark stools but was admitted for cellulitis and unfortunately mentally she has been declining lately and will probably need assisted living or a nursing home and she currently has no other GI complaints and she is not on any aspirin  or blood thinner Past Medical History:  Diagnosis Date   Allergic rhinitis, cause unspecified    Angioneurotic edema not elsewhere classified    Arthritis    Cancer (HCC) 2006   lung ca   Chronic obstructive asthma, unspecified    Collapsed lung 1959   hx of with chest tube insertion   Complication of anesthesia    severe headache x1   Depression    Environmental allergies    Headache(784.0)    Malignant neoplasm of bronchus and lung, unspecified site    UTI (urinary tract infection)     Past Surgical History:  Procedure Laterality Date   ABDOMINAL HYSTERECTOMY     CHOLECYSTECTOMY  1969   CLOSED MANIPULATION SHOULDER Right 1990's   frozen shoulder   HIP ARTHROPLASTY Right 06/11/2014   Procedure: RIGHT HIP HEMI ARTHROPLASTY;  Surgeon: Dempsey Melodi GAILS, MD;  Location: [facility];  Service: Orthopedics;  Laterality: Right;   right upper lobectomy  2006   TOTAL KNEE ARTHROPLASTY Left 03/21/2014   Procedure: LEFT TOTAL KNEE ARTHROPLASTY;  Surgeon: Dempsey GAILS Melodi, MD;  Location: [facility];  Service: Orthopedics;  Laterality: Left;    Family History  Problem Relation Age of Onset   Asthma Unknown    Alzheimer's disease Mother    Stroke Mother     Social History:  reports that she quit smoking about 31 years ago. Her smoking use included cigarettes. She started smoking about 81 years ago. She has a 62.5  pack-year smoking history. She has never used smokeless tobacco. She reports that she does not drink alcohol and does not use drugs.  Allergies: Allergies[1]  Medications: I have reviewed the patient's current medications.  Results for orders placed or performed during the hospital encounter of 12/08/24 (from the past 48 hours)  CBC with Differential     Status: Abnormal   Collection Time: 12/08/24  4:55 PM  Result Value Ref Range   WBC 10.9 (H) 4.0 - 10.5 K/uL   RBC 4.13 3.87 - 5.11 MIL/uL   Hemoglobin 12.1 12.0 - 15.0 g/dL   HCT 61.8 63.9 - 53.9 %   MCV 92.3 80.0 - 100.0 fL   MCH 29.3 26.0 - 34.0 pg   MCHC 31.8 30.0 - 36.0 g/dL   RDW 87.0 88.4 - 84.4 %   Platelets 329 150 - 400 K/uL   nRBC 0.0 0.0 - 0.2 %   Neutrophils Relative % 79 %   Neutro Abs 8.6 (H) 1.7 - 7.7 K/uL   Lymphocytes Relative 16 %   Lymphs Abs 1.7 0.7 - 4.0 K/uL   Monocytes Relative 5 %   Monocytes Absolute 0.5 0.1 - 1.0 K/uL   Eosinophils Relative 0 %   Eosinophils Absolute 0.0 0.0 - 0.5 K/uL   Basophils Relative 0 %   Basophils Absolute 0.0 0.0 - 0.1 K/uL   Immature Granulocytes 0 %   Abs Immature Granulocytes  0.04 0.00 - 0.07 K/uL    Comment: Performed at [facility], [address] [place], [place] [address]  Comprehensive metabolic panel     Status: Abnormal   Collection Time: 12/08/24  4:55 PM  Result Value Ref Range   Sodium 140 135 - 145 mmol/L   Potassium 4.2 3.5 - 5.1 mmol/L   Chloride 100 98 - 111 mmol/L   CO2 29 22 - 32 mmol/L   Glucose, Bld 106 (H) 70 - 99 mg/dL    Comment: Glucose reference range applies only to samples taken after fasting for at least 8 hours.   BUN 30 (H) 8 - 23 mg/dL   Creatinine, Ser 9.08 0.44 - 1.00 mg/dL   Calcium 9.9 8.9 - 89.6 mg/dL   Total Protein 6.9 6.5 - 8.1 g/dL   Albumin 4.3 3.5 - 5.0 g/dL   AST 32 15 - 41 U/L   ALT 12 0 - 44 U/L   Alkaline Phosphatase 80 38 - 126 U/L   Total Bilirubin 0.6 0.0 - 1.2 mg/dL   GFR, Estimated 58 (L) >60  mL/min    Comment: (NOTE) Calculated using the CKD-EPI Creatinine Equation (2021)    Anion gap 11 5 - 15    Comment: Performed at [facility], [address], [place], [place] [address]  Blood culture (routine x 2)     Status: None (Preliminary result)   Collection Time: 12/08/24  4:55 PM   Specimen: BLOOD  Result Value Ref Range   Specimen Description      BLOOD LEFT ANTECUBITAL Performed at [facility], [address] [place], [place] [address]    Special Requests      BOTTLES DRAWN AEROBIC AND ANAEROBIC Blood Culture adequate volume Performed at [facility], [address] [place], [place] [address]    Culture      NO GROWTH < 12 HOURS Performed at [facility], [address], [place], [place] [address]    Report Status PENDING   I-Stat CG4 Lactic Acid     Status: None   Collection Time: 12/08/24  5:05 PM  Result Value Ref Range   Lactic Acid, Venous 1.7 0.5 - 1.9 mmol/L  Comprehensive metabolic panel     Status: Abnormal   Collection Time: 12/09/24  3:34 AM  Result Value Ref Range   Sodium 141 135 - 145 mmol/L   Potassium 3.8 3.5 - 5.1 mmol/L   Chloride 106 98 - 111 mmol/L   CO2 26 22 - 32 mmol/L   Glucose, Bld 115 (H) 70 - 99 mg/dL    Comment: Glucose reference range applies only to samples taken after fasting for at least 8 hours.   BUN 22 8 - 23 mg/dL   Creatinine, Ser 9.23 0.44 - 1.00 mg/dL   Calcium 8.5 (L) 8.9 - 10.3 mg/dL   Total Protein 5.1 (L) 6.5 - 8.1 g/dL   Albumin 3.4 (L) 3.5 - 5.0 g/dL   AST 22 15 - 41 U/L   ALT 9 0 - 44 U/L   Alkaline Phosphatase 61 38 - 126 U/L   Total Bilirubin 0.5 0.0 - 1.2 mg/dL   GFR, Estimated >39 >39 mL/min    Comment: (NOTE) Calculated using the CKD-EPI Creatinine Equation (2021)    Anion gap 8 5 - 15    Comment: Performed at [facility], [address] [place], [place] [address]  CBC  Status: Abnormal   Collection Time: 12/09/24   3:34 AM  Result Value Ref Range   WBC 6.8 4.0 - 10.5 K/uL   RBC 3.71 (L) 3.87 - 5.11 MIL/uL   Hemoglobin 10.8 (L) 12.0 - 15.0 g/dL   HCT 66.0 (L) 63.9 - 53.9 %   MCV 91.4 80.0 - 100.0 fL   MCH 29.1 26.0 - 34.0 pg   MCHC 31.9 30.0 - 36.0 g/dL   RDW 86.9 88.4 - 84.4 %   Platelets 263 150 - 400 K/uL   nRBC 0.0 0.0 - 0.2 %    Comment: Performed at [facility], [address] [place], [place] [address]  Magnesium      Status: None   Collection Time: 12/09/24  3:34 AM  Result Value Ref Range   Magnesium  1.9 1.7 - 2.4 mg/dL    Comment: Performed at [facility], [address] [place], [place] [address]  Phosphorus     Status: None   Collection Time: 12/09/24  3:34 AM  Result Value Ref Range   Phosphorus 3.1 2.5 - 4.6 mg/dL    Comment: Performed at [facility], [address], [place], [place] [address]    CT ABDOMEN PELVIS W CONTRAST Result Date: 12/08/2024 EXAM: CT ABDOMEN AND PELVIS WITH CONTRAST 12/08/2024 06:55:32 PM TECHNIQUE: CT of the abdomen and pelvis was performed with the administration of 80 mL of iohexol  (OMNIPAQUE ) 300 MG/ML solution. Multiplanar reformatted images are provided for review. Automated exposure control, iterative reconstruction, and/or weight-based adjustment of the mA/kV was utilized to reduce the radiation dose to as low as reasonably achievable. COMPARISON: Comparison with 03/31/2008. CLINICAL HISTORY: Lower GI bleed. FINDINGS: LOWER CHEST: Aortic aneurysm of the distal thoracic aorta measuring 3.9 cm. LIVER: Hypoattenuating lesions in the liver compatible with cysts. GALLBLADDER AND BILE DUCTS: Status post cholecystectomy. Intra- and extrahepatic biliary dilatation, presumably due to reservoir effect post cholecystectomy. SPLEEN: No acute abnormality. PANCREAS: No acute abnormality. ADRENAL GLANDS: No acute abnormality. KIDNEYS, URETERS AND BLADDER: No stones in the kidneys or ureters. No hydronephrosis. No  perinephric or periureteral stranding. Urinary bladder is unremarkable. GI AND BOWEL: Stomach demonstrates no acute abnormality. Heterogeneous enhancement and soft tissue thickening about the rectum concerning for rectal mass (series 12 image 62). Assessment of active bleeding is limited on single phase exam. There is no bowel obstruction. PERITONEUM AND RETROPERITONEUM: Small volume free fluid in the pelvis. No free air. VASCULATURE: Aorta is normal in caliber. LYMPH NODES: No lymphadenopathy. REPRODUCTIVE ORGANS: Status post hysterectomy. BONES AND SOFT TISSUES: Scoliosis with a scoliotic lumbar curve. Posterior fusion L4-L5. Right hip arthroplasty. Remote fractures of the right pubic rami. No acute osseous abnormality. No focal soft tissue abnormality. IMPRESSION: 1. Heterogeneous enhancement and soft tissue thickening about the rectum concerning for rectal mass. 2. Aortic aneurysm of the distal thoracic aorta measuring 3.9 cm. Recommend annual imaging followup by CTA or MRA. This recommendation follows 2010 ACCF/AHA/AATS/ACR/ASA/SCA/SCAI/SIR/STS/SVM Guidelines for the Diagnosis and Management of Patients with Thoracic Aortic Disease. Circulation. 2010; 121: Z733-z630. Aortic aneurysm NOS (ICD10-I71.9) Electronically signed by: Norman Gatlin MD 12/08/2024 07:06 PM EST RP Workstation: [id number]   DG Ankle Complete Right Result Date: 12/08/2024 CLINICAL DATA:  right ankle pain EXAM: RIGHT ANKLE - COMPLETE 3+ VIEW COMPARISON:  None Available. FINDINGS: Bones are osteopenic. No acute osseous finding, fracture, or malalignment. No significant joint abnormality or arthropathy. Lower extremity subcutaneous edema noted. IMPRESSION: 1. Osteopenia. No acute osseous finding. 2. Lower extremity subcutaneous edema. Electronically  Signed   By: CHRISTELLA.  Shick M.D.   On: 12/08/2024 17:26    ROS negative except above Blood pressure (!) 111/58, pulse 61, temperature 97.8 F (36.6 C), temperature source Oral, resp. rate 17,  height 5' 1 (1.549 m), weight 44 kg, SpO2 91%. Physical Exam vital signs stable afebrile no acute distress abdomen is soft nontender CT reviewed questionable rectal mass BUN dropped from 30-22 hemoglobin dropped from 12-10.8  Assessment/Plan: Multiple medical problems elderly pleasant some dementia in patient with abnormal CT and complaints of both bright red blood and melena Plan: I discussed the case with her medical power of attorney her daughter-in-law Ashyra Cantin and after our conversation about the options to include doing nothing proceeding with a flex sig and or a endoscopy or just treating empirically with a pump inhibitor we have elected to proceed tomorrow with a flex sig and if negative without a mass then we will do an endoscopy if she does okay with that procedure and we will keep her on clear liquids for now and strict n.p.o. after 7 AM with further workup and plans pending those findings  Diamante Rubin E 12/09/2024, 2:37 PM         [1]  Allergies Allergen Reactions   Codeine Other (See Comments)    GI intolerance and headaches   Penicillin G Benzathine Dermatitis   Aspirin  Other (See Comments)    GI upset in large amounts; sick stomach, in large amounts   Penicillins Rash   Sulfamethoxazole-Trimethoprim Rash and Dermatitis

## 2024-12-09 NOTE — Hospital Course (Signed)
 89 y.o. F with COPD on home O2 (noncompliant), HTN, and MCI who presented with redness and swelling of right lower leg.  Also reported melena and BRBPR in the ER.    Admitted for treatment of cellulitis and GI evaluation.

## 2024-12-09 NOTE — Evaluation (Signed)
 Occupational Therapy Evaluation Patient Details Name: [name] [name] MRN: [id number] DOB: 11/19/1926 Today's Date: 12/09/2024   History of Present Illness   Pt is a 89 y.o. female admitted with RLE cellulitis/wounds, potential GI bleed with blood in stool, FTT. PMH significant for HTN, COPD with chronic hypoxic respiratory failure, lung ca, MCI, anxiety.     Clinical Impressions Pt admitted with above. Pt poor historian, unable to provide accurate PLOF or home setup. Per chart review, pt lives alone. Pt received in bed, anxious + restless, pulled out LUE IV. Redirectable with reassurance and gentle re-orientation. Pt able to roll R<>L for pericare, bathing and gown change bed level, MIN A for bed mobility and able to ambulate in hallway with MIN A using RW. Pt presents to acute OT with deficits in strength, activity tolerance, balance, cognition and mobility. Pt would benefit from skilled OT services to address noted impairments and functional limitations (see below for any additional details) in order to maximize safety and independence while minimizing falls risk and caregiver burden. Anticipate the need for follow up OT services upon acute hospital DC. If pt has 24/7 supervision and assist from family, may discharge with [facility] services. If family cannot provide necessary level of assist, consider continued inpatient follow up therapy, <3 hours/day.       If plan is discharge home, recommend the following:   A little help with walking and/or transfers;A little help with bathing/dressing/bathroom;Assistance with cooking/housework;Direct supervision/assist for medications management;Direct supervision/assist for financial management;Assist for transportation;Help with stairs or ramp for entrance;Supervision due to cognitive status     Functional Status Assessment   Patient has had a recent decline in their functional status and demonstrates the ability to make significant improvements in  function in a reasonable and predictable amount of time.     Equipment Recommendations   BSC/3in1      Precautions/Restrictions   Precautions Precautions: Fall Restrictions Weight Bearing Restrictions Per Provider Order: No     Mobility Bed Mobility Overal bed mobility: Needs Assistance Bed Mobility: Rolling, Supine to Sit Rolling: Contact guard assist   Supine to sit: Min assist     General bed mobility comments: able to roll R<>L in bed with CGA, follows commands. MIN A to transition from supine to sitting with assist for trunk    Transfers Overall transfer level: Needs assistance Equipment used: Rolling walker (2 wheels) Transfers: Sit to/from Stand Sit to Stand: Min assist                  Balance Overall balance assessment: Needs assistance Sitting-balance support: Feet supported, Bilateral upper extremity supported Sitting balance-Leahy Scale: Fair     Standing balance support: Bilateral upper extremity supported, During functional activity Standing balance-Leahy Scale: Fair Standing balance comment: reliant on external support                           ADL either performed or assessed with clinical judgement   ADL Overall ADL's : Needs assistance/impaired Eating/Feeding: Sitting;Set up   Grooming: Sitting;Set up   Upper Body Bathing: Sitting;Minimal assistance   Lower Body Bathing: Sit to/from stand;Maximal assistance   Upper Body Dressing : Sitting;Minimal assistance;Bed level Upper Body Dressing Details (indicate cue type and reason): doff/don gown bed level (gown wet upon arrival) Lower Body Dressing: Bed level;Maximal assistance;Sit to/from stand Lower Body Dressing Details (indicate cue type and reason): doff underwear bed level; pt able to lift up BLE but RLE tender/hypersensitive  with cellulitis Toilet Transfer: Minimal assistance;Rolling walker (2 wheels);Ambulation Toilet Transfer Details (indicate cue type and reason):  pt able to walk in hallway MIN A using RW Toileting- Clothing Manipulation and Hygiene: Sit to/from stand;Minimal assistance Toileting - Clothing Manipulation Details (indicate cue type and reason): bed level pericare after purewick failure     Functional mobility during ADLs: Minimal assistance;Rolling walker (2 wheels)       Vision Baseline Vision/History: 1 Wears glasses       Perception         Praxis         Pertinent Vitals/Pain Pain Assessment Pain Assessment: Faces Faces Pain Scale: Hurts whole lot Pain Location: BLE while in bed at rest Pain Descriptors / Indicators: Discomfort, Grimacing, Guarding, Moaning Pain Intervention(s): Limited activity within patient's tolerance, Monitored during session, Repositioned     Extremity/Trunk Assessment Upper Extremity Assessment Upper Extremity Assessment: Generalized weakness   Lower Extremity Assessment Lower Extremity Assessment: Generalized weakness;Defer to PT evaluation       Communication Communication Communication: Impaired Factors Affecting Communication: Hearing impaired   Cognition Arousal: Alert Behavior During Therapy: Restless, WFL for tasks assessed/performed (restless start of session, improves with re-orientation and reassurance.) Cognition: History of cognitive impairments             OT - Cognition Comments: Pt alert to self, restless, pulled out L IV. Redirectable with therapeutic reassurance.                 Following commands: Intact       Cueing  General Comments   Cueing Techniques: Verbal cues;Gestural cues;Tactile cues  HR 80s-100 bpm with mobility, unable to obtain accurate pleth with cold fingers. Alerted RN to pt pulling out IV L arm start of session (no active bleeding)           Home Living Family/patient expects to be discharged to:: Private residence Living Arrangements: Alone                           Home Equipment: Rollator (4 wheels)    Additional Comments: per chart, pt lives alone. pt poor historian and cannot provide additional information.      Prior Functioning/Environment Prior Level of Function : Patient poor historian/Family not available             Mobility Comments: 4WW      OT Problem List: Decreased strength;Decreased range of motion;Decreased activity tolerance;Impaired balance (sitting and/or standing);Decreased cognition;Decreased knowledge of use of DME or AE;Decreased knowledge of precautions   OT Treatment/Interventions: Self-care/ADL training;Therapeutic exercise;Therapeutic activities;Patient/family education;Balance training      OT Goals(Current goals can be found in the care plan section)   Acute Rehab OT Goals OT Goal Formulation: Patient unable to participate in goal setting Time For Goal Achievement: 12/23/24 Potential to Achieve Goals: Fair   OT Frequency:  Min 2X/week    Co-evaluation PT/OT/SLP Co-Evaluation/Treatment: Yes Reason for Co-Treatment: Complexity of the patient's impairments (multi-system involvement);For patient/therapist safety;To address functional/ADL transfers PT goals addressed during session: Mobility/safety with mobility;Balance OT goals addressed during session: ADL's and self-care      AM-PAC OT 6 Clicks Daily Activity     Outcome Measure Help from another person eating meals?: None Help from another person taking care of personal grooming?: None Help from another person toileting, which includes using toliet, bedpan, or urinal?: A Lot Help from another person bathing (including washing, rinsing, drying)?: A Lot Help from another person  to put on and taking off regular upper body clothing?: A Little Help from another person to put on and taking off regular lower body clothing?: A Lot 6 Click Score: 17   End of Session Equipment Utilized During Treatment: Gait belt;Rolling walker (2 wheels) Nurse Communication: Mobility status  Activity  Tolerance: Patient tolerated treatment well Patient left: in chair;with call bell/phone within reach;with chair alarm set;Other (comment) (MD in room end of session)  OT Visit Diagnosis: Muscle weakness (generalized) (M62.81);Other abnormalities of gait and mobility (R26.89);Unsteadiness on feet (R26.81)                Time: 0940-1004 OT Time Calculation (min): 24 min Charges:  OT General Charges $OT Visit: 1 Visit OT Evaluation $OT Eval Low Complexity: 1 Low  Latrise Bowland L. Kamarii Buren, OTR/L  12/09/24, 1:39 PM

## 2024-12-09 NOTE — Progress Notes (Signed)
" °  Progress Note   Patient: [name] [name] FMW:[id number] DOB: [birth year] DOA: 12/08/2024     0 DOS: the patient was seen and examined on 12/09/2024 at 10:01AM      Brief hospital course: 89 y.o. F with COPD on home O2 (noncompliant), HTN, and MCI who presented with redness and swelling of right lower leg.  Also reported melena and BRBPR in the ER.    Admitted for treatment of cellulitis and GI evaluation.     Assessment and Plan: Right lower leg cellulitis - Continue vancomycin  and cefepime , day 2  Reported GI bleeding Normocytic anemia Hgb down to 10 today.   - Consult GI - trend Hgb  COPD Chronic hypoxic respiratory failure Appears at baseline - Continue Breztri   Hypertension Blood pressure controlled - Continue metoprolol   Mild cognitive impairment Awaiting collateral -Resume home Zyprexa       Subjective: Patient is relatively incoherent, has no specific concerns.  No fever, no nursing concerns.  I recommend flex sig tomorrow.     Physical Exam: BP (!) 111/58 (BP Location: Right Arm)   Pulse 61   Temp 97.8 F (36.6 C) (Oral)   Resp 17   Ht 5' 1 (1.549 m)   Wt 44 kg   SpO2 91%   BMI 18.33 kg/m   Elderly adult female, sitting up in recliner, no acute distress RRR, no murmurs, no peripheral edema Respiratory normal, lungs clear without rales or wheezes Abdomen soft, no tenderness palpation or guarding Tension distracted, affect blunted, psychomotor slowing noted, short-term memory severely impaired, face symmetric, speech fluent, severe generalized weakness The bilateral legs have chronic venous stasis hyperpigmentation changes, the right leg has some redness, swelling, and warmth consistent with cellulitis    Data Reviewed: Discussed with GI Basic metabolic panel normal CBC shows slightly worsened anemia    Family Communication: Called daughter in law, left voicemail    Disposition: Status is:  Inpatient         Author: Lonni SHAUNNA Dalton, MD 12/09/2024 5:31 PM  For on call review [email or web address].    "

## 2024-12-09 NOTE — [place] FL2 (Signed)
 " [place]  MEDICAID FL2 LEVEL OF CARE FORM     IDENTIFICATION  Patient Name: [name] [name] Birthdate: 11/03/1927 Sex: female Admission Date (Current Location): 12/08/2024  [facility] and [place] Number:  Producer, Television/film/video and Address:  [facility],  [address] [place], [place] [address]      Provider Number: [id number]  Attending Physician Name and Address:  Jonel Lonni SQUIBB, *  Relative Name and Phone Number:  Zeitz,Lynn  Relative, Emergency Contact  [phone] (Mobile)  (POA)    Current Level of Care: Hospital Recommended Level of Care: Skilled Nursing Facility Prior Approval Number:    Date Approved/Denied:   PASRR Number: [id number]  Discharge Plan: SNF    Current Diagnoses: Patient Active Problem List   Diagnosis Date Noted   Cellulitis 12/08/2024   Pain due to onychomycosis of toenails of both feet 06/09/2019   Hip fracture (HCC) 06/10/2014   Closed stable fracture of multiple pubic rami (HCC) 05/09/2014   Constipation 04/29/2014   S/P total knee arthroplasty 04/10/2014   Postoperative anemia due to acute blood loss 03/22/2014   Hyponatremia 03/22/2014   OA (osteoarthritis) of knee 03/21/2014   CHRONIC ETHMOIDAL SINUSITIS 01/15/2011   Allergic rhinitis due to pollen 12/31/2010   NECK PAIN 11/13/2010   Cancer of upper lobe of right lung (HCC) 08/19/2007   COPD mixed type (HCC) 08/19/2007   ANGIOEDEMA 08/19/2007    Orientation RESPIRATION BLADDER Height & Weight     Place, Self, Time  Normal Incontinent, External catheter Weight: 44 kg Height:  5' 1 (154.9 cm)  BEHAVIORAL SYMPTOMS/MOOD NEUROLOGICAL BOWEL NUTRITION STATUS      Continent Diet (clear liquids)  AMBULATORY STATUS COMMUNICATION OF NEEDS Skin   Limited Assist Verbally Normal                       Personal Care Assistance Level of Assistance  Bathing, Feeding, Dressing Bathing Assistance: Limited assistance Feeding assistance: Limited assistance Dressing  Assistance: Limited assistance     Functional Limitations Info  Sight, Hearing, Speech Sight Info: Impaired (eyeglasses) Hearing Info: Impaired (hard of hearing) Speech Info: Adequate    SPECIAL CARE FACTORS FREQUENCY  PT (By licensed PT), OT (By licensed OT)     PT Frequency: 5x/wk OT Frequency: 5x/wk            Contractures Contractures Info: Not present    Additional Factors Info  Code Status, Allergies, Psychotropic Code Status Info: DNR Allergies Info: Codeine, Penicillin G Benzathine, Aspirin , Penicillins, Sulfamethoxazole-trimethoprim Psychotropic Info: N/A         Current Medications (12/09/2024):  This is the current hospital active medication list Current Facility-Administered Medications  Medication Dose Route Frequency Provider Last Rate Last Admin   0.9 %  sodium chloride  infusion   Intravenous Continuous Gherghe, Costin M, MD 75 mL/hr at 12/08/24 2100 New Bag at 12/08/24 2100   budesonide -glycopyrrolate -formoterol  (BREZTRI ) 160-9-4.8 MCG/ACT inhaler 2 puff  2 puff Inhalation BID Gherghe, Costin M, MD       ceFEPIme  (MAXIPIME ) 2 g in sodium chloride  0.9 % 100 mL IVPB  2 g Intravenous Q24H Robertson, Crystal S, RPH       levalbuterol  (XOPENEX ) nebulizer solution 0.63 mg  0.63 mg Nebulization Q6H PRN Gherghe, Costin M, MD       metoprolol  succinate (TOPROL -XL) 24 hr tablet 50 mg  50 mg Oral q morning Gherghe, Costin M, MD   50 mg at 12/09/24 1042   ondansetron  (ZOFRAN ) tablet 4  mg  4 mg Oral Q6H PRN Gherghe, Costin M, MD       Or   ondansetron  (ZOFRAN ) injection 4 mg  4 mg Intravenous Q6H PRN Gherghe, Costin M, MD       pantoprazole  (PROTONIX ) injection 40 mg  40 mg Intravenous Q12H Gherghe, Costin M, MD   40 mg at 12/09/24 1226   [START ON 12/10/2024] vancomycin  (VANCOREADY) IVPB 750 mg/150 mL  750 mg Intravenous Q36H Lynwood Setter, RPH         Discharge Medications: Please see discharge summary for a list of discharge medications.  Relevant Imaging  Results:  Relevant Lab Results:   Additional Information SSN: [id number]  Alfonse JONELLE Rex, RN     "

## 2024-12-09 NOTE — TOC Initial Note (Addendum)
 Transition of Care [facility]) - Initial/Assessment Note    Patient Details  Name: [name] [name] MRN: [id number] Date of Birth: Feb 08, 1927  Transition of Care [facility]) CM/SW Contact:    Alfonse JONELLE Rex, RN Phone Number: 12/09/2024, 2:52 PM  Clinical Narrative:  Admitted for home, presented to ED w/dtr in law [name] [name])  with c/o blood in stool, worsening of RLE cellulitis, increase in confusion, poor po intake and generalized weakness.     Call to pt's dtr in law, [name] [name], to introduce role of INPT CM and review for dc planning, PT recommendation for [facility] PT/OT vs SNF,  no answer, vm left with CM name and phone number requesting a call back.   -3:22pm Call received from [name], introduced self and role of INPT CM. [name] reports patient resides alone with no current home care services or home caregiver services, was independent with self care until recently, ambulates with a RW. [name] states she feels its not safe for patient to return home alone at this time. PT recommendation for [name] PT/OT ( if patient has 24/7 support) or SNF. [name] agreeable to SNF, prefers [facility]. FL2 updated faxed out for bed offers.                     Patient Goals and CMS Choice            Expected Discharge Plan and Services                                              Prior Living Arrangements/Services                       Activities of Daily Living   ADL Screening (condition at time of admission) Independently performs ADLs?: Yes (appropriate for developmental age) Is the patient deaf or have difficulty hearing?: Yes Does the patient have difficulty seeing, even when wearing glasses/contacts?: No Does the patient have difficulty concentrating, remembering, or making decisions?: Yes  Permission Sought/Granted                  Emotional Assessment              Admission diagnosis:  Cellulitis [L03.90] Patient Active Problem List   Diagnosis Date Noted    Cellulitis 12/08/2024   Pain due to onychomycosis of toenails of both feet 06/09/2019   Hip fracture (HCC) 06/10/2014   Closed stable fracture of multiple pubic rami (HCC) 05/09/2014   Constipation 04/29/2014   S/P total knee arthroplasty 04/10/2014   Postoperative anemia due to acute blood loss 03/22/2014   Hyponatremia 03/22/2014   OA (osteoarthritis) of knee 03/21/2014   CHRONIC ETHMOIDAL SINUSITIS 01/15/2011   Allergic rhinitis due to pollen 12/31/2010   NECK PAIN 11/13/2010   Cancer of upper lobe of right lung (HCC) 08/19/2007   COPD mixed type (HCC) 08/19/2007   ANGIOEDEMA 08/19/2007   PCP:  Ransom Other, MD Pharmacy:   [facility] [phone] - [place], [place] - [address] AT [name] [address] [place] [address] Phone: [phone] Fax: [phone]     Social Drivers of Health (SDOH) Social History: SDOH Screenings   Food Insecurity: No Food Insecurity (12/08/2024)  Housing: Low Risk (12/08/2024)  Transportation Needs: No Transportation Needs (12/08/2024)  Utilities: Not At Risk (12/08/2024)  Social Connections:  Socially Isolated (12/08/2024)  Tobacco Use: Medium Risk (12/09/2024)   SDOH Interventions:     Readmission Risk Interventions     No data to display

## 2024-12-10 ENCOUNTER — Encounter (HOSPITAL_COMMUNITY): Admission: EM | Payer: Self-pay | Source: Home / Self Care | Attending: Internal Medicine

## 2024-12-10 ENCOUNTER — Inpatient Hospital Stay (HOSPITAL_COMMUNITY): Admitting: Certified Registered Nurse Anesthetist

## 2024-12-10 ENCOUNTER — Encounter (HOSPITAL_COMMUNITY): Payer: Self-pay | Admitting: Internal Medicine

## 2024-12-10 LAB — COMPREHENSIVE METABOLIC PANEL WITH GFR
ALT: 10 U/L (ref 0–44)
AST: 24 U/L (ref 15–41)
Albumin: 3.6 g/dL (ref 3.5–5.0)
Alkaline Phosphatase: 69 U/L (ref 38–126)
Anion gap: 10 (ref 5–15)
BUN: 11 mg/dL (ref 8–23)
CO2: 26 mmol/L (ref 22–32)
Calcium: 9 mg/dL (ref 8.9–10.3)
Chloride: 104 mmol/L (ref 98–111)
Creatinine, Ser: 0.72 mg/dL (ref 0.44–1.00)
GFR, Estimated: >60 mL/min
Glucose, Bld: 97 mg/dL (ref 70–99)
Potassium: 3.5 mmol/L (ref 3.5–5.1)
Sodium: 140 mmol/L (ref 135–145)
Total Bilirubin: 0.8 mg/dL (ref 0.0–1.2)
Total Protein: 5.6 g/dL — ABNORMAL LOW (ref 6.5–8.1)

## 2024-12-10 LAB — CBC
HCT: 38 % (ref 36.0–46.0)
Hemoglobin: 12 g/dL (ref 12.0–15.0)
MCH: 29 pg (ref 26.0–34.0)
MCHC: 31.6 g/dL (ref 30.0–36.0)
MCV: 91.8 fL (ref 80.0–100.0)
Platelets: 265 10*3/uL (ref 150–400)
RBC: 4.14 MIL/uL (ref 3.87–5.11)
RDW: 13.1 % (ref 11.5–15.5)
WBC: 5.5 10*3/uL (ref 4.0–10.5)
nRBC: 0 % (ref 0.0–0.2)

## 2024-12-10 LAB — CULTURE, BLOOD (ROUTINE X 2)
Culture: NO GROWTH
Culture: NO GROWTH
Special Requests: ADEQUATE
Special Requests: ADEQUATE

## 2024-12-10 MED ORDER — SODIUM CHLORIDE 0.9 % IV SOLN
2.0000 g | INTRAVENOUS | Status: AC
  Administered 2024-12-10: 2 g via INTRAVENOUS
  Filled 2024-12-10: qty 20

## 2024-12-10 MED ORDER — SODIUM CHLORIDE 0.9 % IV SOLN: INTRAVENOUS | Status: DC

## 2024-12-10 MED ORDER — PROPOFOL 10 MG/ML IV BOLUS
INTRAVENOUS | Status: DC | PRN
  Administered 2024-12-10 (×2): 10 mg via INTRAVENOUS

## 2024-12-10 MED ORDER — PROPOFOL 500 MG/50ML IV EMUL
INTRAVENOUS | Status: DC | PRN
  Administered 2024-12-10: 75 ug/kg/min via INTRAVENOUS

## 2024-12-10 MED ORDER — ALUM & MAG HYDROXIDE-SIMETH 200-200-20 MG/5ML PO SUSP
30.0000 mL | ORAL | Status: AC | PRN
  Administered 2024-12-10: 30 mL via ORAL
  Filled 2024-12-10: qty 30

## 2024-12-10 NOTE — Op Note (Signed)
 [facility] Patient Name: [name] [name] Procedure Date: 12/10/2024 MRN: [id number] Attending MD: Oliva Boots , MD, [id number] Date of Birth: September 30, 1927 CSN: [id number] Age: 89 Admit Type: Inpatient Procedure:                Flexible Sigmoidoscopy Indications:              Hematochezia, Abnormal CT of the GI tract Providers:                Oliva Boots, MD, Mliss Eagles, RN, Corky Czech,                            Technician, Leita Hasten, CRNA Referring MD:              Medicines:                Monitored Anesthesia Care Complications:            No immediate complications. Estimated Blood Loss:     Estimated blood loss: none. Procedure:                Pre-Anesthesia Assessment:                           - Prior to the procedure, a History and Physical                            was performed, and patient medications and                            allergies were reviewed. The patient's tolerance of                            previous anesthesia was also reviewed. The risks                            and benefits of the procedure and the sedation                            options and risks were discussed with the patient.                            All questions were answered, and informed consent                            was obtained. Prior Anticoagulants: The patient has                            taken no anticoagulant or antiplatelet agents. ASA                            Grade Assessment: III - A patient with severe                            systemic disease. After reviewing the risks and  benefits, the patient was deemed in satisfactory                            condition to undergo the procedure.                           After obtaining informed consent, the scope was                            passed under direct vision. The [id number] ([id number])                            Olympus endoscope was introduced through the anus                             and advanced to the the rectosigmoid junction. The                            flexible sigmoidoscopy was accomplished without                            difficulty. The patient tolerated the procedure                            well. The quality of the bowel preparation was fair. Scope In: Scope Out: Findings:      The perianal examination was normal.      A frond-like/villous non-obstructing medium-sized mass was found in the       distal rectum. The mass was partially circumferential (involving       two-thirds of the lumen circumference). No bleeding was present.       Biopsies were taken with a cold forceps for histology.      A moderate amount of stool was found in the proximal rectum and in the       mid rectum, making visualization difficult. Lavage of the area was       performed using a small amount of sterile water , resulting in incomplete       clearance with fair visualization. Impression:               - Preparation of the colon was fair.                           - Likely malignant tumor in the distal rectum.                            Biopsied.                           - Stool in the proximal rectum and in the mid                            rectum. Moderate Sedation:      Not Applicable - Patient had care per Anesthesia. Recommendation:           - Resume previous diet today.                           -  Continue present medications.                           - Await pathology results. Consider radiation                            oncology consult even if biopsies are nondiagnostic                            since she probably does have some cancerous cells                            within the mass and I believe radiation would                            probably be the most likely therapy they might                            agree to if they wanted to treat- Return to GI                            clinic PRN. Please call Dr. Saintclair this weekend if                             any GI question or problem                           - Telephone GI clinic for pathology results in 5                            days.                           - Telephone GI clinic if symptomatic PRN. Procedure Code(s):        --- Professional ---                           [phone], 52, Sigmoidoscopy, flexible; with biopsy,                            single or multiple Diagnosis Code(s):        --- Professional ---                           D49.0, Neoplasm of unspecified behavior of                            digestive system                           K92.1, Melena (includes Hematochezia)                           R93.3, Abnormal findings on diagnostic imaging of  other parts of digestive tract CPT copyright 2022 American Medical Association. All rights reserved. The codes documented in this report are preliminary and upon coder review may  be revised to meet current compliance requirements. Oliva Boots, MD 12/10/2024 1:55:22 PM This report has been signed electronically. Number of Addenda: 0

## 2024-12-10 NOTE — Plan of Care (Signed)

## 2024-12-10 NOTE — Plan of Care (Signed)
  Problem: Education: Goal: Knowledge of General Education information will improve Description: Including pain rating scale, medication(s)/side effects and non-pharmacologic comfort measures Outcome: Progressing   Problem: Health Behavior/Discharge Planning: Goal: Ability to manage health-related needs will improve Outcome: Progressing   Problem: Clinical Measurements: Goal: Ability to maintain clinical measurements within normal limits will improve Outcome: Progressing Goal: Will remain free from infection Outcome: Progressing Goal: Diagnostic test results will improve Outcome: Progressing Goal: Respiratory complications will improve Outcome: Progressing Goal: Cardiovascular complication will be avoided Outcome: Progressing   Problem: Activity: Goal: Risk for activity intolerance will decrease Outcome: Progressing   Problem: Nutrition: Goal: Adequate nutrition will be maintained Outcome: Progressing   Problem: Coping: Goal: Level of anxiety will decrease Outcome: Progressing   Problem: Elimination: Goal: Will not experience complications related to bowel motility Outcome: Progressing Goal: Will not experience complications related to urinary retention Outcome: Adequate for Discharge   Problem: Pain Managment: Goal: General experience of comfort will improve and/or be controlled Outcome: Progressing   Problem: Safety: Goal: Ability to remain free from injury will improve Outcome: Progressing   Problem: Skin Integrity: Goal: Risk for impaired skin integrity will decrease Outcome: Progressing   Problem: Clinical Measurements: Goal: Ability to avoid or minimize complications of infection will improve Outcome: Progressing   Problem: Skin Integrity: Goal: Skin integrity will improve Outcome: Progressing

## 2024-12-10 NOTE — Anesthesia Procedure Notes (Signed)
 Procedure Name: MAC Date/Time: 12/10/2024 1:20 PM  Performed by: Zulema Leita PARAS, CRNAPre-anesthesia Checklist: Patient identified, Emergency Drugs available, Suction available and Patient being monitored Oxygen  Delivery Method: Simple face mask

## 2024-12-10 NOTE — TOC Progression Note (Signed)
 Transition of Care [facility]) - Progression Note    Patient Details  Name: [name] [name] MRN: [id number] Date of Birth: 18-Feb-1927  [facility]) CM/SW Contact  Alfonse JONELLE Rex, RN Phone Number: 12/10/2024, 2:47 PM  Clinical Narrative:   Met with daughter at bedside to review SNF bed offers [facility], [facility], Clot[facility] Pereyra, [facility] health Care), dtr accepted bed offer at [facility]. Darian, admit coord w/SNF notified.                       Expected Discharge Plan and Services                                               Social Drivers of Health (SDOH) Interventions SDOH Screenings   Food Insecurity: No Food Insecurity (12/08/2024)  Housing: Low Risk (12/08/2024)  Transportation Needs: No Transportation Needs (12/08/2024)  Utilities: Not At Risk (12/08/2024)  Social Connections: Socially Isolated (12/08/2024)  Tobacco Use: Medium Risk (12/10/2024)    Readmission Risk Interventions    12/10/2024    2:47 PM  Readmission Risk Prevention Plan  Post Dischage Appt Complete  Medication Screening Complete  Transportation Screening Complete

## 2024-12-10 NOTE — Progress Notes (Signed)
" °  Progress Note   Patient: [name] [name] FMW:[id number] DOB: 08/15/1927 DOA: 12/08/2024     1 DOS: the patient was seen and examined on 12/10/2024       Brief hospital course: 89 y.o. F with COPD on home O2 (noncompliant), HTN, and MCI who presented with redness and swelling of right lower leg.  Also reported melena and BRBPR in the ER.    Admitted for treatment of cellulitis and GI evaluation.     Assessment and Plan: Right lower leg cellulitis - Continue vancomycin  and cefepime , day 3  Malignant-appearing colon mass Normocytic anemia Noted to have anemia and reported hematochezia on admission.  GI consulted, took for flex sig, found to have villous colon mass. Biopsied.  Hgb stable - Follow biopsy results - Follow up with General Surgery and Oncology outpatient   -Resume previous diet  COPD Chronic hypoxic respiratory failure Stable - Continue Breztri   Hypertension Blood pressure elevated - Continue metoprolol   Mild cognitive impairment Memory seems to be gradually worsening. - Continue home Zyprexa       Subjective: Patient has no complaints, the leg is better, on flex sig today, she was found to have a villous frond-like nonobstructing medium-sized mass in the distal rectum that was partially circumferential, and biopsied.     Physical Exam: BP (!) 167/64   Pulse 60   Temp (!) 97.2 F (36.2 C) (Temporal)   Resp (!) 24   Ht 5' 1 (1.549 m)   Wt 44 kg   SpO2 100%   BMI 18.33 kg/m   Elderly adult female, sitting up in recliner, no acute distress RRR, no murmurs, no peripheral edema Respiratory normal, lungs clear without rales or wheezes Abdomen soft, no tenderness palpation or guarding Tension distracted, affect blunted, psychomotor slowing noted, short-term memory severely impaired, face symmetric, speech fluent, severe generalized weakness The bilateral legs have chronic venous stasis hyperpigmentation changes, the right leg has some redness,  swelling, and warmth consistent with cellulitis    Data Reviewed: Basic metabolic panel normal CBC shows no anemia   Family Communication: Daughter-in-law and grandson at the bedside    Disposition: Status is: Inpatient         Author: Lonni SHAUNNA Dalton, MD 12/10/2024 6:03 PM  For on call review [email or web address].    "

## 2024-12-10 NOTE — Progress Notes (Signed)
 Daughter, [name] [name], states [name] [name] has [facility] care with [facility].

## 2024-12-10 NOTE — Anesthesia Preprocedure Evaluation (Addendum)
"                                    Anesthesia Evaluation  Patient identified by MRN, date of birth, ID band Patient awake    Reviewed: Allergy  & Precautions, NPO status , Patient's Chart, lab work & pertinent test results  Airway Mallampati: I  TM Distance: <3 FB Neck ROM: Full    Dental  (+) Upper Dentures, Lower Dentures   Pulmonary asthma , COPD, former smoker   breath sounds clear to auscultation       Cardiovascular negative cardio ROS  Rhythm:Regular Rate:Normal     Neuro/Psych  Headaches PSYCHIATRIC DISORDERS  Depression       GI/Hepatic negative GI ROS, Neg liver ROS,,,  Endo/Other  negative endocrine ROS    Renal/GU negative Renal ROS     Musculoskeletal  (+) Arthritis ,    Abdominal   Peds  Hematology  (+) Blood dyscrasia, anemia   Anesthesia Other Findings   Reproductive/Obstetrics                              Anesthesia Physical Anesthesia Plan  ASA: 3  Anesthesia Plan: MAC   Post-op Pain Management: Minimal or no pain anticipated   Induction: Intravenous  PONV Risk Score and Plan: 0 and Propofol  infusion  Airway Management Planned: Natural Airway  Additional Equipment: None  Intra-op Plan:   Post-operative Plan:   Informed Consent: I have reviewed the patients History and Physical, chart, labs and discussed the procedure including the risks, benefits and alternatives for the proposed anesthesia with the patient or authorized representative who has indicated his/her understanding and acceptance.   Patient has DNR.     Plan Discussed with: CRNA  Anesthesia Plan Comments:          Anesthesia Quick Evaluation  "

## 2024-12-10 NOTE — Progress Notes (Signed)
 Occupational Therapy Treatment Patient Details Name: [name] [name] MRN: [id number] DOB: Dec 06, 1926 Today's Date: 12/10/2024   History of present illness Pt is a 89 y.o. female admitted with RLE cellulitis/wounds, potential GI bleed with blood in stool, FTT. PMH significant for HTN, COPD with chronic hypoxic respiratory failure, lung ca, MCI, anxiety.   OT comments  Pt pleasantly confused, alert only to self. Gentle re-orientation provided. Pt transitions to a seated position with CGA, performs seated ADLs with supervision/setup, and transfers to recliner with RW MIN A. Per TOC note, pt lives alone and does not have 24/7 assist from family, therefore recommending continued inpatient follow up therapy, <3 hours/day.        If plan is discharge home, recommend the following:  A little help with walking and/or transfers;A little help with bathing/dressing/bathroom;Assistance with cooking/housework;Direct supervision/assist for medications management;Direct supervision/assist for financial management;Assist for transportation;Help with stairs or ramp for entrance;Supervision due to cognitive status   Equipment Recommendations  BSC/3in1       Precautions / Restrictions Precautions Precautions: Fall Precaution/Restrictions Comments: LLE wrapped in ace bandage Restrictions Weight Bearing Restrictions Per Provider Order: No       Mobility Bed Mobility Overal bed mobility: Needs Assistance       Supine to sit: Contact guard     General bed mobility comments: transitions to seated with CGA, increased time and guarding of BLE with wounds present Patient Response: Cooperative  Transfers Overall transfer level: Needs assistance   Transfers: Sit to/from Stand Sit to Stand: Min assist           General transfer comment: cues for hand placement, sequencing and use of AD     Balance Overall balance assessment: Needs assistance Sitting-balance support: Feet supported, Bilateral  upper extremity supported Sitting balance-Leahy Scale: Fair     Standing balance support: Bilateral upper extremity supported, During functional activity Standing balance-Leahy Scale: Fair Standing balance comment: reliant on external support                           ADL either performed or assessed with clinical judgement   ADL Overall ADL's : Needs assistance/impaired     Grooming: Sitting;Set up;Wash/dry hands;Wash/dry face Grooming Details (indicate cue type and reason): while at EOB                     Toileting- Clothing Manipulation and Hygiene: Maximal assistance;Sit to/from stand Toileting - Clothing Manipulation Details (indicate cue type and reason): small BM smear, MAX A for standing pericare     Functional mobility during ADLs: Minimal assistance;Rolling walker (2 wheels) General ADL Comments: MIN A for SPT to recliner, BLE left elevated     Communication Communication Communication: Impaired Factors Affecting Communication: Hearing impaired   Cognition   Behavior During Therapy: WFL for tasks assessed/performed Cognition: History of cognitive impairments             OT - Cognition Comments: Alert only to self (not DOB), hx of cognitive impairments                 Following commands: Intact        Cueing   Cueing Techniques: Verbal cues, Gestural cues, Tactile cues             Pertinent Vitals/ Pain       Pain Assessment Pain Assessment: Faces Faces Pain Scale: Hurts a little bit Pain Location: BLE with tactile input Pain Descriptors /  Indicators: Discomfort, Sore Pain Intervention(s): Limited activity within patient's tolerance, Monitored during session, Repositioned   Frequency  Min 2X/week        Progress Toward Goals  OT Goals(current goals can now be found in the care plan section)  Progress towards OT goals: Progressing toward goals  Acute Rehab OT Goals OT Goal Formulation: Patient unable to  participate in goal setting Time For Goal Achievement: 12/23/24 Potential to Achieve Goals: Fair  Plan         AM-PAC OT 6 Clicks Daily Activity     Outcome Measure   Help from another person eating meals?: None Help from another person taking care of personal grooming?: None Help from another person toileting, which includes using toliet, bedpan, or urinal?: A Lot Help from another person bathing (including washing, rinsing, drying)?: A Lot Help from another person to put on and taking off regular upper body clothing?: A Little Help from another person to put on and taking off regular lower body clothing?: A Lot 6 Click Score: 17    End of Session Equipment Utilized During Treatment: Gait belt;Rolling walker (2 wheels)  OT Visit Diagnosis: Muscle weakness (generalized) (M62.81);Other abnormalities of gait and mobility (R26.89);Unsteadiness on feet (R26.81);Pain Pain - Right/Left: Left Pain - part of body: Leg;Ankle and joints of foot   Activity Tolerance Patient tolerated treatment well;No increased pain   Patient Left in chair;with call bell/phone within reach;with chair alarm set;Other (comment) (lab in room)   Nurse Communication Mobility status        Time: 9142-9084 OT Time Calculation (min): 18 min  Charges: OT General Charges $OT Visit: 1 Visit OT Treatments $Self Care/Home Management : 8-22 mins  Damiah Mcdonald L. Venna Berberich, OTR/L  12/10/24, 10:23 AM

## 2024-12-10 NOTE — Anesthesia Postprocedure Evaluation (Signed)
"   Anesthesia Post Note  Patient: [name] [name]  Procedure(s) Performed: SIGMOIDOSCOPY, FLEXIBLE BIOPSY, GI TRACT     Patient location during evaluation: PACU Anesthesia Type: MAC Level of consciousness: awake and alert Pain management: pain level controlled Vital Signs Assessment: post-procedure vital signs reviewed and stable Respiratory status: spontaneous breathing, nonlabored ventilation, respiratory function stable and patient connected to nasal cannula oxygen  Cardiovascular status: stable and blood pressure returned to baseline Postop Assessment: no apparent nausea or vomiting Anesthetic complications: no   No notable events documented.  Last Vitals:  Vitals:   12/10/24 1351 12/10/24 1352  BP:  (!) 136/50  Pulse: 60 62  Resp: 17 20  Temp:    SpO2: 100% 100%    Last Pain:  Vitals:   12/10/24 1344  TempSrc: Temporal  PainSc: 0-No pain                 [name] [name]      "

## 2024-12-10 NOTE — Transfer of Care (Signed)
 Immediate Anesthesia Transfer of Care Note  Patient: [name] [name]  Procedure(s) Performed: [place] SIDE BIOPSY, GI TRACT  Patient Location: PACU and Endoscopy Unit  Anesthesia Type:MAC  Level of Consciousness: awake and alert   Airway & Oxygen  Therapy: Patient Spontanous Breathing and Patient connected to face mask oxygen   Post-op Assessment: Report given to RN and Post -op Vital signs reviewed and stable  Post vital signs: Reviewed and stable  Last Vitals:  Vitals Value Taken Time  BP    Temp    Pulse    Resp    SpO2      Last Pain:  Vitals:   12/10/24 1259  TempSrc: Temporal  PainSc:          Complications: No notable events documented.

## 2024-12-10 NOTE — Progress Notes (Signed)
 Lebron CHRISTELLA Crete 12:53 PM  Subjective: Patient doing well without any specific complaints and we rediscussed the procedure and talked to her power of attorney and her daughter-in-law and answered all of her questions  Objective: Vital signs stable afebrile no acute distress exam please see preassessment evaluation chemistries okay hemoglobin stable  Assessment: Bright red blood per rectum and melena and patient with abnormal CT scan  Plan: Will proceed with flex sig with anesthesia assistance and if negative and endoscopy just to be sure with further workup and plans pending those findings  [facility]  office [phone] After 5PM or if no answer call [phone]

## 2025-01-05 ENCOUNTER — Ambulatory Visit: Admitting: Podiatry
